# Patient Record
Sex: Female | Born: 1950 | Race: White | Hispanic: No | Marital: Married | Smoking: Former smoker
Health system: Southern US, Community
[De-identification: ages and names within clinical notes are randomized; demographics above are authoritative.]

## PROBLEM LIST (undated history)

## (undated) DIAGNOSIS — M899 Disorder of bone, unspecified: Secondary | ICD-10-CM

## (undated) DIAGNOSIS — E039 Hypothyroidism, unspecified: Secondary | ICD-10-CM

## (undated) DIAGNOSIS — I639 Cerebral infarction, unspecified: Secondary | ICD-10-CM

## (undated) DIAGNOSIS — R7302 Impaired glucose tolerance (oral): Secondary | ICD-10-CM

## (undated) DIAGNOSIS — G8194 Hemiplegia, unspecified affecting left nondominant side: Secondary | ICD-10-CM

## (undated) DIAGNOSIS — E559 Vitamin D deficiency, unspecified: Secondary | ICD-10-CM

## (undated) DIAGNOSIS — M949 Disorder of cartilage, unspecified: Secondary | ICD-10-CM

## (undated) DIAGNOSIS — F329 Major depressive disorder, single episode, unspecified: Secondary | ICD-10-CM

## (undated) DIAGNOSIS — E785 Hyperlipidemia, unspecified: Secondary | ICD-10-CM

## (undated) DIAGNOSIS — I10 Essential (primary) hypertension: Secondary | ICD-10-CM

## (undated) DIAGNOSIS — F411 Generalized anxiety disorder: Secondary | ICD-10-CM

## (undated) HISTORY — DX: Hypothyroidism, unspecified: E03.9

## (undated) HISTORY — DX: Disorder of cartilage, unspecified: M94.9

## (undated) HISTORY — DX: Essential (primary) hypertension: I10

## (undated) HISTORY — DX: Generalized anxiety disorder: F41.1

## (undated) HISTORY — DX: Disorder of bone, unspecified: M89.9

## (undated) HISTORY — DX: Major depressive disorder, single episode, unspecified: F32.9

## (undated) HISTORY — DX: Vitamin D deficiency, unspecified: E55.9

## (undated) HISTORY — DX: Impaired glucose tolerance (oral): R73.02

## (undated) HISTORY — DX: Hyperlipidemia, unspecified: E78.5

---

## 1999-03-11 ENCOUNTER — Emergency Department (HOSPITAL_COMMUNITY): Admission: EM | Admit: 1999-03-11 | Discharge: 1999-03-11 | Payer: Self-pay | Admitting: Emergency Medicine

## 1999-03-12 ENCOUNTER — Emergency Department (HOSPITAL_COMMUNITY): Admission: EM | Admit: 1999-03-12 | Discharge: 1999-03-12 | Payer: Self-pay | Admitting: Emergency Medicine

## 1999-03-20 ENCOUNTER — Emergency Department (HOSPITAL_COMMUNITY): Admission: EM | Admit: 1999-03-20 | Discharge: 1999-03-20 | Payer: Self-pay | Admitting: Emergency Medicine

## 2002-12-20 ENCOUNTER — Ambulatory Visit (HOSPITAL_COMMUNITY): Admission: RE | Admit: 2002-12-20 | Discharge: 2002-12-20 | Payer: Self-pay | Admitting: Obstetrics and Gynecology

## 2002-12-20 ENCOUNTER — Encounter (INDEPENDENT_AMBULATORY_CARE_PROVIDER_SITE_OTHER): Payer: Self-pay

## 2004-12-15 ENCOUNTER — Ambulatory Visit: Payer: Self-pay | Admitting: Internal Medicine

## 2005-05-06 ENCOUNTER — Other Ambulatory Visit: Admission: RE | Admit: 2005-05-06 | Discharge: 2005-05-06 | Payer: Self-pay | Admitting: Obstetrics and Gynecology

## 2005-12-15 ENCOUNTER — Ambulatory Visit: Payer: Self-pay | Admitting: Internal Medicine

## 2006-12-27 ENCOUNTER — Ambulatory Visit: Payer: Self-pay | Admitting: Internal Medicine

## 2006-12-27 LAB — CONVERTED CEMR LAB
ALT: 25 units/L (ref 0–40)
AST: 29 units/L (ref 0–37)
Albumin: 4.3 g/dL (ref 3.5–5.2)
Alkaline Phosphatase: 46 units/L (ref 39–117)
BUN: 11 mg/dL (ref 6–23)
Basophils Relative: 0.6 % (ref 0.0–1.0)
Bilirubin Urine: NEGATIVE
Calcium: 9.6 mg/dL (ref 8.4–10.5)
Chloride: 104 meq/L (ref 96–112)
Creatinine, Ser: 0.8 mg/dL (ref 0.4–1.2)
Crystals: NEGATIVE
GFR calc non Af Amer: 79 mL/min
HCT: 46 % (ref 36.0–46.0)
HDL: 64.8 mg/dL (ref >39.0)
Hemoglobin: 15.5 g/dL — ABNORMAL HIGH (ref 12.0–15.0)
Hgb A1c MFr Bld: 6.1 % — ABNORMAL HIGH (ref 4.6–6.0)
LDL Cholesterol: 107 mg/dL — ABNORMAL HIGH (ref 0–99)
Monocytes Absolute: 0.5 10*3/uL (ref 0.2–0.7)
Monocytes Relative: 6.2 % (ref 3.0–11.0)
Nitrite: NEGATIVE
RBC: 4.68 M/uL (ref 3.87–5.11)
RDW: 13.6 % (ref 11.5–14.6)
Specific Gravity, Urine: >=1.03 (ref 1.000–1.03)
Total Bilirubin: 0.7 mg/dL (ref 0.3–1.2)
Total CHOL/HDL Ratio: 3
VLDL: 24 mg/dL (ref 0–40)
pH: 5.5 (ref 5.0–8.0)

## 2008-01-10 ENCOUNTER — Encounter: Payer: Self-pay | Admitting: Internal Medicine

## 2008-02-12 ENCOUNTER — Ambulatory Visit: Payer: Self-pay | Admitting: Internal Medicine

## 2008-02-12 DIAGNOSIS — M899 Disorder of bone, unspecified: Secondary | ICD-10-CM

## 2008-02-12 DIAGNOSIS — E785 Hyperlipidemia, unspecified: Secondary | ICD-10-CM

## 2008-02-12 DIAGNOSIS — M8580 Other specified disorders of bone density and structure, unspecified site: Secondary | ICD-10-CM | POA: Insufficient documentation

## 2008-02-12 DIAGNOSIS — F411 Generalized anxiety disorder: Secondary | ICD-10-CM | POA: Insufficient documentation

## 2008-02-12 DIAGNOSIS — E039 Hypothyroidism, unspecified: Secondary | ICD-10-CM

## 2008-02-12 DIAGNOSIS — I10 Essential (primary) hypertension: Secondary | ICD-10-CM | POA: Insufficient documentation

## 2008-02-12 DIAGNOSIS — F3289 Other specified depressive episodes: Secondary | ICD-10-CM

## 2008-02-12 DIAGNOSIS — E739 Lactose intolerance, unspecified: Secondary | ICD-10-CM | POA: Insufficient documentation

## 2008-02-12 DIAGNOSIS — M949 Disorder of cartilage, unspecified: Secondary | ICD-10-CM

## 2008-02-12 DIAGNOSIS — F329 Major depressive disorder, single episode, unspecified: Secondary | ICD-10-CM

## 2008-02-12 HISTORY — DX: Hypothyroidism, unspecified: E03.9

## 2008-02-12 HISTORY — DX: Essential (primary) hypertension: I10

## 2008-02-12 HISTORY — DX: Disorder of bone, unspecified: M89.9

## 2008-02-12 HISTORY — DX: Hyperlipidemia, unspecified: E78.5

## 2008-02-12 HISTORY — DX: Generalized anxiety disorder: F41.1

## 2008-02-12 HISTORY — DX: Other specified depressive episodes: F32.89

## 2008-02-12 HISTORY — DX: Major depressive disorder, single episode, unspecified: F32.9

## 2008-02-13 LAB — CONVERTED CEMR LAB
ALT: 39 units/L — ABNORMAL HIGH (ref 0–35)
Albumin: 4.6 g/dL (ref 3.5–5.2)
BUN: 16 mg/dL (ref 6–23)
Basophils Absolute: 0.1 10*3/uL (ref 0.0–0.1)
Basophils Relative: 1 % (ref 0.0–1.0)
Bilirubin Urine: NEGATIVE
CO2: 33 meq/L — ABNORMAL HIGH (ref 19–32)
Calcium: 9 mg/dL (ref 8.4–10.5)
Creatinine, Ser: 1 mg/dL (ref 0.4–1.2)
Direct LDL: 117.3 mg/dL
Eosinophils Relative: 0.9 % (ref 0.0–5.0)
Glucose, Bld: 99 mg/dL (ref 70–99)
HCT: 44.1 % (ref 36.0–46.0)
HDL: 73 mg/dL (ref >39.0)
Hemoglobin, Urine: NEGATIVE
Hgb A1c MFr Bld: 6 % (ref 4.6–6.0)
Lymphocytes Relative: 27.3 % (ref 12.0–46.0)
MCHC: 34 g/dL (ref 30.0–36.0)
MCV: 101.2 fL — ABNORMAL HIGH (ref 78.0–100.0)
Monocytes Relative: 9.2 % (ref 3.0–12.0)
Neutro Abs: 4.2 10*3/uL (ref 1.4–7.7)
Nitrite: NEGATIVE
Total Protein, Urine: NEGATIVE mg/dL
Total Protein: 7.5 g/dL (ref 6.0–8.3)
Triglycerides: 103 mg/dL (ref 0–149)
Urine Glucose: NEGATIVE mg/dL
Urobilinogen, UA: 0.2 (ref 0.0–1.0)
VLDL: 21 mg/dL (ref 0–40)
WBC: 6.9 10*3/uL (ref 4.5–10.5)

## 2009-02-19 ENCOUNTER — Ambulatory Visit: Payer: Self-pay | Admitting: Internal Medicine

## 2009-02-19 DIAGNOSIS — G4710 Hypersomnia, unspecified: Secondary | ICD-10-CM | POA: Insufficient documentation

## 2009-02-19 LAB — CONVERTED CEMR LAB
ALT: 24 units/L (ref 0–35)
AST: 27 units/L (ref 0–37)
BUN: 16 mg/dL (ref 6–23)
Basophils Absolute: 0 10*3/uL (ref 0.0–0.1)
Bilirubin Urine: NEGATIVE
Bilirubin, Direct: 0.2 mg/dL (ref 0.0–0.3)
Creatinine, Ser: 0.9 mg/dL (ref 0.4–1.2)
Eosinophils Relative: 0.9 % (ref 0.0–5.0)
GFR calc non Af Amer: 67.91 mL/min (ref >60)
Glucose, Bld: 108 mg/dL — ABNORMAL HIGH (ref 70–99)
Hemoglobin, Urine: NEGATIVE
Ketones, ur: NEGATIVE mg/dL
LDL Cholesterol: 72 mg/dL (ref 0–99)
Monocytes Absolute: 0.4 10*3/uL (ref 0.1–1.0)
Monocytes Relative: 6.3 % (ref 3.0–12.0)
Neutrophils Relative %: 64.4 % (ref 43.0–77.0)
Platelets: 206 10*3/uL (ref 150.0–400.0)
RDW: 12.8 % (ref 11.5–14.6)
Total Bilirubin: 0.7 mg/dL (ref 0.3–1.2)
Total CHOL/HDL Ratio: 3
Total Protein, Urine: NEGATIVE mg/dL
Triglycerides: 148 mg/dL (ref 0.0–149.0)
Urine Glucose: NEGATIVE mg/dL
Urobilinogen, UA: 0.2 (ref 0.0–1.0)
VLDL: 29.6 mg/dL (ref 0.0–40.0)
WBC: 7.1 10*3/uL (ref 4.5–10.5)

## 2009-02-24 LAB — CONVERTED CEMR LAB: Vit D, 25-Hydroxy: 25 ng/mL — ABNORMAL LOW (ref 30–89)

## 2010-04-14 ENCOUNTER — Ambulatory Visit: Payer: Self-pay | Admitting: Internal Medicine

## 2010-04-14 DIAGNOSIS — E559 Vitamin D deficiency, unspecified: Secondary | ICD-10-CM

## 2010-04-14 HISTORY — DX: Vitamin D deficiency, unspecified: E55.9

## 2010-04-14 LAB — CONVERTED CEMR LAB
Albumin: 4.8 g/dL (ref 3.5–5.2)
Alkaline Phosphatase: 52 units/L (ref 39–117)
Basophils Absolute: 0 10*3/uL (ref 0.0–0.1)
Basophils Relative: 0.4 % (ref 0.0–3.0)
Bilirubin Urine: NEGATIVE
CO2: 34 meq/L — ABNORMAL HIGH (ref 19–32)
Chloride: 102 meq/L (ref 96–112)
Cholesterol: 227 mg/dL — ABNORMAL HIGH (ref 0–200)
Eosinophils Absolute: 0.1 10*3/uL (ref 0.0–0.7)
Glucose, Bld: 106 mg/dL — ABNORMAL HIGH (ref 70–99)
HCT: 44.9 % (ref 36.0–46.0)
HDL: 67.7 mg/dL (ref >39.00)
Hemoglobin: 15.4 g/dL — ABNORMAL HIGH (ref 12.0–15.0)
Lymphocytes Relative: 28.7 % (ref 12.0–46.0)
Lymphs Abs: 2.1 10*3/uL (ref 0.7–4.0)
MCHC: 34.3 g/dL (ref 30.0–36.0)
MCV: 101.8 fL — ABNORMAL HIGH (ref 78.0–100.0)
Neutro Abs: 4.7 10*3/uL (ref 1.4–7.7)
Nitrite: NEGATIVE
Potassium: 4 meq/L (ref 3.5–5.1)
RBC: 4.41 M/uL (ref 3.87–5.11)
RDW: 13.9 % (ref 11.5–14.6)
Sodium: 143 meq/L (ref 135–145)
Total Protein, Urine: NEGATIVE mg/dL
Total Protein: 7.9 g/dL (ref 6.0–8.3)
Urine Glucose: NEGATIVE mg/dL
VLDL: 86 mg/dL — ABNORMAL HIGH (ref 0.0–40.0)
pH: 5.5 (ref 5.0–8.0)

## 2010-08-06 ENCOUNTER — Telehealth: Payer: Self-pay | Admitting: Internal Medicine

## 2010-08-10 ENCOUNTER — Ambulatory Visit: Payer: Self-pay | Admitting: Internal Medicine

## 2010-10-29 ENCOUNTER — Telehealth: Payer: Self-pay | Admitting: Internal Medicine
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
## 2010-11-04 NOTE — Progress Notes (Signed)
  Phone Note Refill Request Message from:  Fax from Pharmacy on August 06, 2010 2:27 PM  Refills Requested: Medication #1:  LEVOXYL 100 MCG  TABS 1 by mouth once daily   Dosage confirmed as above?Dosage Confirmed   Last Refilled: 04/2010   Notes: [facility]  Medication #2:  LISINOPRIL 10 MG  TABS 1 by mouth once daily   Dosage confirmed as above?Dosage Confirmed   Last Refilled: 04/2010   Notes: [facility]  Medication #3:  LOVASTATIN 20 MG TABS Take 1 tablet by mouth once a day   Dosage confirmed as above?Dosage Confirmed   Last Refilled: 04/2010   Notes: [facility] Initial call taken by: Robin Ewing CMA (AAMA),  August 06, 2010 2:28 PM    Prescriptions: LOVASTATIN 20 MG TABS (LOVASTATIN) Take 1 tablet by mouth once a day  #90 x 2   Entered by:   Sharon Seller CMA ([place])   Authorized by:   Biagio Borg MD   Signed by:   Sharon Seller CMA ([place]) on 08/06/2010   Method used:   Faxed to ...       [facility] (mail-order)             , [place]         Ph: [phone]       Fax: [phone]   [id number] LISINOPRIL 10 MG  TABS (LISINOPRIL) 1 by mouth once daily  #90 x 2   Entered by:   Shirlean Mylar Ewing CMA ([place])   Authorized by:   Biagio Borg MD   Signed by:   Sharon Seller CMA ([place]) on 08/06/2010   Method used:   Faxed to ...       [facility] (mail-order)             , [place]         Ph: [phone]       Fax: [phone]   [id number] LEVOXYL 100 MCG  TABS (LEVOTHYROXINE SODIUM) 1 by mouth once daily  #90 x 2   Entered by:   Shirlean Mylar Ewing CMA ([place])   Authorized by:   Biagio Borg MD   Signed by:   Sharon Seller CMA ([place]) on 08/06/2010   Method used:   Faxed to ...       [facility] (mail-order)             , [place]         Ph: [phone]       Fax: [phone]   RxID:   [id number]

## 2010-11-04 NOTE — Assessment & Plan Note (Signed)
Summary: FOLLOW UP TO DISCUSS RX-LB   Vital Signs:  Patient profile:   60 year old female Height:      63 inches Weight:      142.13 pounds BMI:     25.27 O2 Sat:      96 % on Room air Temp:     98.9 degrees F oral Pulse rate:   84 / minute BP sitting:   150 / 82  (left arm) Cuff size:   regular  Vitals Entered By: Shirlean Mylar Ewing CMA [facility]) (August 10, 2010 9:34 AM)  O2 Flow:  Room air CC: followup discuss medication/RE   CC:  followup discuss medication/RE.  History of Present Illness: here to f/u, unfortunately abilify too expensive and not taking, Denies worsening depressive symptoms, suicidal ideation, or panic. or psychosis symptoms  and plans to f/u with Dr Reddy/psych soon.  Pt denies CP, worsening sob, doe, wheezing, orthopnea, pnd, worsening LE edema, palps, dizziness or syncope  Pt denies new neuro symptoms such as headache, facial or extremity weakness  No fever, wt loss, night sweats, loss of appetite or other constitutional symptoms  Pt denies polydipsia, polyuria.  Overall good compliance with meds, trying to follow low chol, DM diet, wt stable, little excercise however .  Denies hyper or hypothyroid symtpoms such at wt, voice, skin changes.  Not yet taken her BP med this am.  BP at home more often < 140/90  ROS:  all other neg per pt  Problems Prior to Update: 1)  Vitamin D Deficiency  (ICD-268.9) 2)  Preventive Health Care  (ICD-V70.0) 3)  Hypersomnia  (ICD-780.54) 4)  Preventive Health Care  (ICD-V70.0) 5)  Preventive Health Care  (ICD-V70.0) 6)  Anxiety  (ICD-300.00) 7)  Glucose Intolerance  (ICD-271.3) 8)  Osteopenia  (ICD-733.90) 9)  Hypothyroidism  (ICD-244.9) 10)  Hypertension  (ICD-401.9) 11)  Hyperlipidemia  (ICD-272.4) 12)  Depression  (ICD-311)  Medications Prior to Update: 1)  Levoxyl 100 Mcg  Tabs (Levothyroxine Sodium) .[name] Kitchen.. 1 By Mouth Once Daily 2)  Lisinopril 10 Mg  Tabs (Lisinopril) .[name] Kitchen.. 1 By Mouth Once Daily 3)  Alprazolam 0.5 Mg Tabs  (Alprazolam) .... Take 1 Tablet By Mouth Twice A Day 4)  Lovastatin 20 Mg Tabs (Lovastatin) .... Take 1 Tablet By Mouth Once A Day 5)  Paroxetine Hcl 40 Mg Tabs (Paroxetine Hcl) .... Take 1 and 1/2 By Mouth Once Daily 6)  Abilify 5 Mg Tabs (Aripiprazole) .... 1/2 By Mouth Once Daily 7)  Vitamin D 1000 Unit Tabs (Cholecalciferol) .[name] Kitchen.. 1po Once Daily  Current Medications (verified): 1)  Levoxyl 100 Mcg  Tabs (Levothyroxine Sodium) .[name] Kitchen.. 1 By Mouth Once Daily 2)  Lisinopril 10 Mg  Tabs (Lisinopril) .[name] Kitchen.. 1 By Mouth Once Daily 3)  Alprazolam 0.5 Mg Tabs (Alprazolam) .... Take 1 Tablet By Mouth 6 Times Per Day As Needed - Per Dr Reece Levy 4)  Lovastatin 20 Mg Tabs (Lovastatin) .... Take 1 Tablet By Mouth Once A Day 5)  Paroxetine Hcl 40 Mg Tabs (Paroxetine Hcl) .[name] Kitchen.. 1 By Mouth Qid 6)  Vitamin D 1000 Unit Tabs (Cholecalciferol) .[name] Kitchen.. 1po Once Daily  Allergies (verified): 1)  ! 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   Head:  normocephalic and atraumatic.   Eyes:  vision grossly intact, pupils equal, and pupils round.   Ears:  R ear normal and L ear normal.   Nose:  no external deformity and no nasal discharge.   Mouth:  no gingival abnormalities and pharynx pink and moist.   Neck:  supple and no masses.   Lungs:  normal respiratory effort and normal breath sounds.   Heart:  normal rate and regular rhythm.   Abdomen:  soft, non-tender, and normal bowel sounds.   Extremities:  no edema,  no erythema    Impression & Recommendations:  Problem # 1:  HYPOTHYROIDISM (ICD-244.9)  Her updated medication list for this problem includes:    Levoxyl 100 Mcg Tabs (Levothyroxine sodium) .[name] Kitchen... 1 by mouth once daily  Labs Reviewed: TSH: 2.40 (04/14/2010)    HgBA1c: 6.0 (02/12/2008) Chol: 227 (04/14/2010)   HDL: 67.70 (04/14/2010)   LDL: 72 (02/19/2009)   TG: 430.0 (04/14/2010) stable overall by hx and exam, ok to continue meds/tx as is   Problem # 2:  HYPERTENSION (ICD-401.9)  Her updated medication list for this problem includes:    Lisinopril 10 Mg Tabs (Lisinopril) .[name] Kitchen... 1 by mouth once daily  BP today: 150/82 Prior BP: 132/82 (04/14/2010)  Labs Reviewed: K+: 4.0 (04/14/2010) Creat: : 0.9 (04/14/2010)   Chol: 227 (04/14/2010)   HDL: 67.70 (04/14/2010)   LDL: 72 (02/19/2009)   TG: 430.0 (04/14/2010) mild elev today, likely situational, ok to follow, continue same treatment  - not yet taken med this am  Problem # 3:  HYPERLIPIDEMIA (ICD-272.4)  Her updated medication list for this problem includes:    Lovastatin 20 Mg Tabs (Lovastatin) .[name] Kitchen... Take 1 tablet by mouth once a day  Labs Reviewed: SGOT: 29 (04/14/2010)   SGPT: 28 (04/14/2010)   HDL:67.70 (04/14/2010), 67.10 (02/19/2009)  LDL:72 (02/19/2009), DEL (02/12/2008)  Chol:227 (04/14/2010), 169 (02/19/2009)  Trig:430.0 (04/14/2010), 148.0 (02/19/2009) stable overall by hx and exam, ok to continue meds/tx as is   Problem # 4:  DEPRESSION (ICD-311)  Her updated medication list for this problem includes:    Alprazolam 0.5 Mg Tabs (Alprazolam) .[name] Kitchen... Take 1 tablet by mouth 6 times per day as needed - per dr reddy    Paroxetine Hcl 40 Mg Tabs (Paroxetine hcl) .[name] Kitchen... 1 by mouth qid pt to stop the abilify, f/u Dr Reece Levy 2 wks as planned  Complete Medication List: 1)  Levoxyl 100 Mcg Tabs (Levothyroxine sodium) .[name] Kitchen.. 1 by mouth once daily 2)  Lisinopril 10 Mg Tabs (Lisinopril) .[name] Kitchen.. 1 by mouth once daily 3)  Alprazolam 0.5 Mg  Tabs (Alprazolam) .... Take 1 tablet by mouth 6 times per day as needed - per dr reddy 4)  Lovastatin 20 Mg Tabs (Lovastatin) .... Take 1 tablet by mouth once a day 5)  Paroxetine Hcl 40 Mg Tabs (Paroxetine hcl) .[name] Kitchen.. 1 by mouth qid 6)  Vitamin D 1000 Unit Tabs (Cholecalciferol) .[name] Kitchen.. 1po once daily  Patient Instructions: 1)  stop the abilify  2)  you get the alprazolam and paroxetine through Dr Reece Levy through [facility] 3)  Your medications were otherwise [facility] to [facility] 4)  Please schedule a follow-up appointment in July 2012 , or sooner if needed Prescriptions: LOVASTATIN 20 MG TABS (LOVASTATIN) Take 1 tablet by mouth once a day  #90 x 3   Entered and Authorized by:   Biagio Borg MD  Signed by:   Biagio Borg MD on 08/10/2010   Method used:   Faxed to ...       [facility] (mail-order)             , [place]         Ph: [phone]       Fax: [phone]   [id number] LISINOPRIL 10 MG  TABS (LISINOPRIL) 1 by mouth once daily  #90 x 3   Entered and Authorized by:   Biagio Borg MD   Signed by:   Biagio Borg MD on 08/10/2010   Method used:   Faxed to ...       [facility] (mail-order)             , [place]         Ph: [phone]       Fax: [phone]   [id number] LEVOXYL 100 MCG  TABS (LEVOTHYROXINE SODIUM) 1 by mouth once daily  #90 x 3   Entered and Authorized by:   Biagio Borg MD   Signed by:   Biagio Borg MD on 08/10/2010   Method used:   Faxed to ...       [facility] (mail-order)             , [place]         Ph: [phone]       Fax: [phone]   RxID:   [id number]    Orders Added: 1)  Est. Patient Level IV [id number]

## 2010-11-04 NOTE — Progress Notes (Signed)
  Phone Note Call from Patient Call back at [facility] Phone [phone]   Caller: Patient Summary of Call: patient called requesting samples of Abilify. Informed patient Dr. Jenny Reichmann removed Abilify from medication list at [facility] 08/10/2010 due to med. being too expensive and patient had not been taking. Informed patient the last office note said she was to f/u with Dr. Reece Levy. Initial call taken by: Robin Ewing CMA (AAMA),  October 29, 2010 1:52 PM

## 2011-02-18 NOTE — Op Note (Signed)
   NAME:  Susan Davidson, Susan Davidson                         ACCOUNT NO.:  [id number]   MEDICAL RECORD NO.:  [id number]                   PATIENT TYPE:  AMB   LOCATION:  [place]                                  FACILITY:  [facility]   PHYSICIAN:  Bobbye Charleston, M.D.              DATE OF BIRTH:  [birth year]   DATE OF PROCEDURE:  12/20/2002  DATE OF DISCHARGE:                                 OPERATIVE REPORT   PREOPERATIVE DIAGNOSES:  CIN III, extensive both ecto and endocervical.  Unable to do LEEP in office.   POSTOPERATIVE DIAGNOSES:  CIN III, extensive both ecto and endocervical.  Unable to do LEEP in office.   PROCEDURE:  LEEP cone.   ATTENDING:  Bobbye Charleston, M.D.   ANESTHESIA:  MAC.   ESTIMATED BLOOD LOSS:  Minimal.   IV FLUIDS:  800 mL.   URINE OUTPUT:  Not measured.   COMPLICATIONS:  None.   FINDINGS:  Lesion as described on H&P.   MEDICATIONS:  Toradol and about 6 mL of 1% lidocaine with epinephrine.   PATHOLOGY:  1. Ectocervix stitch at 9 o'clock.  2. Ectocervix stitch at 3 o'clock.  3. Endocervix, no stitch.  4. Endocervix stitch at superior margin.   TECHNIQUE:  After adequate anesthesia was achieved patient was prepped and  draped in usual sterile fashion.  The speculum was placed inside the vagina  and the cervix was seen.  Acetic acid was applied to the cervix and  colposcope indicated the lesion as had been previously seen on colposcopy in  the office.  6 mL of lidocaine was then injected in four quadrants on the  cervix without complications.  Lugol solution was then applied and then the  LEEP was performed.  Two passes was used of the 2.1 loop to excise the  ectocervix, then the 1 x 1 loop was then changed out and used to take two  specimens of the endocervix.  Roller ball cautery was used to secure  hemostasis.  Monsel's was applied.  All instruments were withdrawn from the  vagina and the specimens were marked and sent to pathology.  The patient  tolerated  procedure well.  Returned to recovery room in stable condition.                                              Bobbye Charleston, M.D.   MH/MEDQ  D:  12/20/2002  T:  12/20/2002  Job:  [id number]

## 2011-02-18 NOTE — Assessment & Plan Note (Signed)
Susan Davidson                                 ON-CALL NOTE   NAME:Susan Davidson, Susan Davidson                      MRN:          [id number]  DATE:01/04/2007                            DOB:          [birth year]    TIME:  6:27 p.m.   PHONE NUMBER:  [phone].   OBJECTIVE:  Patient has a problem with the amount of her prescription.  She saw Dr. Jenny Reichmann and had an appointment to see her psychiatrist the same  day.  Dr. Jenny Reichmann said he would take care of the alprazolam prescription,  which the patient says is to be 150 pills a month, not 30 pills a month,  as it was written.  She cancelled her psychiatry appointment, and now it  is very hard to get one back.  She is very aggravated.  She has tried  multiple times to get to Dr. Jenny Reichmann, has spoken to the secretary three  times.  Has been trying for a week to get her prescription rectified.  Is very aggravated, threatening to change practices.   Primary care Huntleigh Doolen is Dr. Jenny Reichmann.  Home office is [facility].     Modesto Charon, MD  Electronically Signed    Susan Davidson  DD: 01/04/2007  DT: 01/04/2007  Job #: [id number]   cc:   Biagio Borg, MD

## 2011-04-22 ENCOUNTER — Other Ambulatory Visit: Payer: Self-pay

## 2011-04-22 MED ORDER — LEVOTHYROXINE SODIUM 100 MCG PO TABS: 100.0000 ug | ORAL_TABLET | Freq: Every day | ORAL | Status: DC

## 2011-05-24 ENCOUNTER — Other Ambulatory Visit: Payer: Self-pay

## 2011-05-24 MED ORDER — LISINOPRIL 10 MG PO TABS: 10.0000 mg | ORAL_TABLET | Freq: Every day | ORAL | Status: DC

## 2011-05-24 MED ORDER — LOVASTATIN 20 MG PO TABS: 20.0000 mg | ORAL_TABLET | Freq: Every day | ORAL | Status: DC

## 2011-06-15 ENCOUNTER — Other Ambulatory Visit: Payer: Self-pay

## 2011-06-15 MED ORDER — LEVOTHYROXINE SODIUM 100 MCG PO TABS: 100.0000 ug | ORAL_TABLET | Freq: Every day | ORAL | Status: DC

## 2011-06-15 NOTE — Telephone Encounter (Signed)
Called the patient left message to inform the need to schedule appointment with Dr. Jenny Reichmann in order to continue getting refills.

## 2011-06-17 ENCOUNTER — Other Ambulatory Visit: Payer: Self-pay | Admitting: *Deleted

## 2011-06-17 NOTE — Telephone Encounter (Signed)
Ok with me 

## 2011-06-17 NOTE — Telephone Encounter (Signed)
[facility] pharmacy called regarding rx for pt. Pt wants to swtich to brand name Levoxyl instead of Synthroid/generic levothyroxine-okay to change rx to brand name Levoxyl?    Reference #: [id number]

## 2011-06-17 NOTE — Telephone Encounter (Signed)
Rx called into pharmacy for Levoxyl

## 2011-06-23 ENCOUNTER — Telehealth: Payer: Self-pay

## 2011-06-23 NOTE — Telephone Encounter (Signed)
Patient called requesting name of her thyroid medication. Informed Levoxyl was sent in on June 17, 2011 per patient request.

## 2011-08-27 ENCOUNTER — Encounter: Payer: Self-pay | Admitting: Internal Medicine

## 2011-08-27 DIAGNOSIS — R7302 Impaired glucose tolerance (oral): Secondary | ICD-10-CM

## 2011-08-27 DIAGNOSIS — Z0000 Encounter for general adult medical examination without abnormal findings: Secondary | ICD-10-CM | POA: Insufficient documentation

## 2011-08-27 DIAGNOSIS — Z0001 Encounter for general adult medical examination with abnormal findings: Secondary | ICD-10-CM | POA: Insufficient documentation

## 2011-08-27 HISTORY — DX: Impaired glucose tolerance (oral): R73.02

## 2011-08-30 ENCOUNTER — Encounter: Payer: Self-pay | Admitting: Internal Medicine

## 2011-08-30 ENCOUNTER — Other Ambulatory Visit (INDEPENDENT_AMBULATORY_CARE_PROVIDER_SITE_OTHER): Payer: BC Managed Care – PPO

## 2011-08-30 ENCOUNTER — Ambulatory Visit (INDEPENDENT_AMBULATORY_CARE_PROVIDER_SITE_OTHER): Payer: BC Managed Care – PPO | Admitting: Internal Medicine

## 2011-08-30 VITALS — BP 132/80 | HR 90 | Temp 98.4°F | Ht 63.0 in | Wt 151.5 lb

## 2011-08-30 DIAGNOSIS — I10 Essential (primary) hypertension: Secondary | ICD-10-CM

## 2011-08-30 DIAGNOSIS — R7309 Other abnormal glucose: Secondary | ICD-10-CM

## 2011-08-30 DIAGNOSIS — Z0000 Encounter for general adult medical examination without abnormal findings: Secondary | ICD-10-CM

## 2011-08-30 DIAGNOSIS — R7302 Impaired glucose tolerance (oral): Secondary | ICD-10-CM

## 2011-08-30 LAB — CBC WITH DIFFERENTIAL/PLATELET
Basophils Absolute: 0 10*3/uL (ref 0.0–0.1)
Basophils Relative: 0.3 % (ref 0.0–3.0)
Eosinophils Absolute: 0.1 10*3/uL (ref 0.0–0.7)
Hemoglobin: 14.6 g/dL (ref 12.0–15.0)
MCHC: 33.9 g/dL (ref 30.0–36.0)
MCV: 101.7 fl — ABNORMAL HIGH (ref 78.0–100.0)
Monocytes Absolute: 0.6 10*3/uL (ref 0.1–1.0)
Neutro Abs: 4.9 10*3/uL (ref 1.4–7.7)
Neutrophils Relative %: 65.3 % (ref 43.0–77.0)
RBC: 4.24 Mil/uL (ref 3.87–5.11)
RDW: 14.9 % — ABNORMAL HIGH (ref 11.5–14.6)

## 2011-08-30 LAB — BASIC METABOLIC PANEL
CO2: 26 mEq/L (ref 19–32)
Calcium: 9.2 mg/dL (ref 8.4–10.5)
Creatinine, Ser: 1 mg/dL (ref 0.4–1.2)
GFR: 60.33 mL/min (ref >60.00)
Sodium: 142 mEq/L (ref 135–145)

## 2011-08-30 LAB — HEPATIC FUNCTION PANEL
Alkaline Phosphatase: 47 U/L (ref 39–117)
Bilirubin, Direct: 0.1 mg/dL (ref 0.0–0.3)
Total Bilirubin: 0.5 mg/dL (ref 0.3–1.2)
Total Protein: 7.4 g/dL (ref 6.0–8.3)

## 2011-08-30 LAB — URINALYSIS, ROUTINE W REFLEX MICROSCOPIC
Bilirubin Urine: NEGATIVE
Hgb urine dipstick: NEGATIVE
Total Protein, Urine: NEGATIVE
Urine Glucose: NEGATIVE

## 2011-08-30 LAB — LIPID PANEL
HDL: 72.5 mg/dL (ref >39.00)
LDL Cholesterol: 95 mg/dL (ref 0–99)
Total CHOL/HDL Ratio: 3
Triglycerides: 148 mg/dL (ref 0.0–149.0)

## 2011-08-30 MED ORDER — LEVOTHYROXINE SODIUM 100 MCG PO TABS: 100.0000 ug | ORAL_TABLET | Freq: Every day | ORAL | Status: DC

## 2011-08-30 MED ORDER — LOVASTATIN 20 MG PO TABS: 20.0000 mg | ORAL_TABLET | Freq: Every day | ORAL | Status: DC

## 2011-08-30 MED ORDER — BUPROPION HCL ER (XL) 150 MG PO TB24: 150.0000 mg | ORAL_TABLET | Freq: Every day | ORAL | Status: DC

## 2011-08-30 MED ORDER — ASPIRIN EC 81 MG PO TBEC: 81.0000 mg | DELAYED_RELEASE_TABLET | Freq: Every day | ORAL | Status: AC

## 2011-08-30 MED ORDER — LISINOPRIL 10 MG PO TABS: 10.0000 mg | ORAL_TABLET | Freq: Every day | ORAL | Status: DC

## 2011-08-30 NOTE — Assessment & Plan Note (Signed)
stable overall by hx and exam,, and pt to continue medical treatment as before, cont diet/wt control efforts  For a1c today

## 2011-08-30 NOTE — Assessment & Plan Note (Signed)
Overall doing well, age appropriate education and counseling updated, referrals for preventative services and immunizations addressed, dietary and smoking counseling addressed, most recent labs and ECG reviewed.  I have personally reviewed and have noted: 1) the patient's medical and social history 2) The pt's use of alcohol, tobacco, and illicit drugs 3) The patient's current medications and supplements 4) Functional ability including ADL's, fall risk, home safety risk, hearing and visual impairment 5) Diet and physical activities 6) Evidence for depression or mood disorder 7) The patient's height, weight, and BMI have been recorded in the chart I have made referrals, and provided counseling and education based on review of the above Declines colonscopy or immunizations

## 2011-08-30 NOTE — Progress Notes (Signed)
Subjective:    Patient ID: Susan Davidson, female    DOB: 1951-02-06, 60 y.o.   MRN: [id number]  HPI Here for f/u;  Overall doing ok;  Pt denies CP, worsening SOB, DOE, wheezing, orthopnea, PND, worsening LE edema, palpitations, dizziness or syncope.  Pt denies neurological change such as new Headache, facial or extremity weakness.  Pt denies polydipsia, polyuria, or low sugar symptoms. Pt states overall good compliance with treatment and medications, good tolerability, and trying to follow lower cholesterol diet.  Pt denies worsening depressive symptoms, suicidal ideation or panic. No fever, wt loss, night sweats, loss of appetite, or other constitutional symptoms.  Pt states good ability with ADL's, low fall risk, home safety reviewed and adequate, no significant changes in hearing or vision, and occasionally active with exercise.  No other new complaints.  Denies hyper or hypo thyroid symptoms such as voice, skin or hair change. Does have sense of ongoing fatigue, but denies signficant hypersomnolence. And wellbutrin recetnly increased per pscyhiatry. Past Medical History  Diagnosis Date  . Impaired glucose tolerance 08/27/2011   No past surgical history on file.  reports that she has been smoking.  She does not have any smokeless tobacco history on file. Her alcohol and drug histories not on file. family history is not on file. Allergies  Allergen Reactions  . Epinephrine   . Influenza Vac Typ     fatigue   No current outpatient prescriptions on file prior to visit.   Review of Systems Review of Systems  Constitutional: Negative for diaphoresis, activity change, appetite change and unexpected weight change.  HENT: Negative for hearing loss, ear pain, facial swelling, mouth sores and neck stiffness.   Eyes: Negative for pain, redness and visual disturbance.  Respiratory: Negative for shortness of breath and wheezing.   Cardiovascular: Negative for chest pain and palpitations.    Gastrointestinal: Negative for diarrhea, blood in stool, abdominal distention and rectal pain.  Genitourinary: Negative for hematuria, flank pain and decreased urine volume.  Musculoskeletal: Negative for myalgias and joint swelling.  Skin: Negative for color change and wound.  Neurological: Negative for syncope and numbness.  Hematological: Negative for adenopathy.  Psychiatric/Behavioral: Negative for hallucinations, self-injury, decreased concentration and agitation.      Objective:   Physical Exam BP 132/80  Pulse 90  Temp(Src) 98.4 F (36.9 C) (Oral)  Ht 5\' 3"  (1.6 m)  Wt 151 lb 8 oz (68.72 kg)  BMI 26.84 kg/m2  SpO2 96% Physical Exam  VS noted Constitutional: Pt is oriented to person, place, and time. Appears well-developed and well-nourished.  HENT:  Head: Normocephalic and atraumatic.  Right Ear: External ear normal.  Left Ear: External ear normal.  Nose: Nose normal.  Mouth/Throat: Oropharynx is clear and moist.  Eyes: Conjunctivae and EOM are normal. Pupils are equal, round, and reactive to light.  Neck: Normal range of motion. Neck supple. No JVD present. No tracheal deviation present.  Cardiovascular: Normal rate, regular rhythm, normal heart sounds and intact distal pulses.   Pulmonary/Chest: Effort normal and breath sounds normal.  Abdominal: Soft. Bowel sounds are normal. There is no tenderness.  Musculoskeletal: Normal range of motion. Exhibits no edema.  Lymphadenopathy:  Has no cervical adenopathy.  Neurological: Pt is alert and oriented to person, place, and time. Pt has normal reflexes. No cranial nerve deficit.  Skin: Skin is warm and dry. No rash noted.  Psychiatric:  Has  normal mood and affect. Behavior is normal.     Assessment & Plan:

## 2011-08-30 NOTE — Patient Instructions (Addendum)
Continue all other medications as before Your medications were refilled to the pharmacy Please start Aspirin 81 mg - 1 per day - COATED only - to help reduce risk of stroke and heart attack Please go to LAB in the Basement for the blood and/or urine tests to be done today Please call the phone number [phone] (the [place]) for results of testing in 2-3 days;  When calling, simply dial the number, and when prompted enter the MRN number above (the Medical Record Number) and the # key, then the message should start. Please return in 1 year for your yearly visit, or sooner if needed, with Lab testing done 3-5 days before

## 2011-08-30 NOTE — Assessment & Plan Note (Signed)
stable overall by hx and exam, most recent data reviewed with pt, and pt to continue medical treatment as before  BP Readings from Last 3 Encounters:  08/30/11 132/80  08/10/10 150/82  04/14/10 132/82

## 2011-09-05 ENCOUNTER — Telehealth: Payer: Self-pay

## 2011-09-05 NOTE — Telephone Encounter (Signed)
Ok for change to generic levoxyl - to robin to handle

## 2011-09-05 NOTE — Telephone Encounter (Signed)
Pharmacy called requesting MD authorization to change synthroid to levoxyl per pt request. Ref # [id number]

## 2011-09-06 MED ORDER — LEVOTHYROXINE SODIUM 100 MCG PO TABS: 100.0000 ug | ORAL_TABLET | Freq: Every day | ORAL | Status: DC

## 2011-09-08 ENCOUNTER — Telehealth: Payer: Self-pay | Admitting: *Deleted

## 2011-09-08 NOTE — Telephone Encounter (Signed)
Received script for synthroid, but pt been taking levoxyl 100mg  and want to continue taking levoxyl. Is this ok for pt to continue. Call back [phone], ref # [id number]

## 2011-09-08 NOTE — Telephone Encounter (Signed)
Either is ok with me as they are pharmacologically essentially the same

## 2011-09-09 NOTE — Telephone Encounter (Signed)
Notified [facility] spoke with Jennie/pharmacist per md ok for levoxyl...09/09/11@11 :58am/LMB

## 2012-01-27 ENCOUNTER — Other Ambulatory Visit: Payer: Self-pay

## 2012-01-27 MED ORDER — LEVOTHYROXINE SODIUM 100 MCG PO TABS: 100.0000 ug | ORAL_TABLET | Freq: Every day | ORAL | Status: DC

## 2012-01-27 NOTE — Telephone Encounter (Signed)
Patient called requested refill on thyroid medication.

## 2012-07-13 ENCOUNTER — Other Ambulatory Visit: Payer: Self-pay

## 2012-07-13 MED ORDER — LOVASTATIN 20 MG PO TABS: 20.0000 mg | ORAL_TABLET | Freq: Every day | ORAL | Status: DC

## 2012-07-13 MED ORDER — LISINOPRIL 10 MG PO TABS: 10.0000 mg | ORAL_TABLET | Freq: Every day | ORAL | Status: DC

## 2012-09-03 ENCOUNTER — Other Ambulatory Visit: Payer: Self-pay

## 2012-09-03 MED ORDER — LOVASTATIN 20 MG PO TABS: 20.0000 mg | ORAL_TABLET | Freq: Every day | ORAL | Status: DC

## 2012-09-03 MED ORDER — LISINOPRIL 10 MG PO TABS: 10.0000 mg | ORAL_TABLET | Freq: Every day | ORAL | Status: DC

## 2012-09-03 NOTE — Addendum Note (Signed)
Addended by: Sharon Seller B on: 09/03/2012 11:49 AM   Modules accepted: Orders

## 2012-10-19 ENCOUNTER — Ambulatory Visit (INDEPENDENT_AMBULATORY_CARE_PROVIDER_SITE_OTHER): Payer: No Typology Code available for payment source | Admitting: Internal Medicine

## 2012-10-19 ENCOUNTER — Other Ambulatory Visit (INDEPENDENT_AMBULATORY_CARE_PROVIDER_SITE_OTHER): Payer: No Typology Code available for payment source

## 2012-10-19 VITALS — BP 138/82 | HR 96 | Temp 98.5°F | Ht 63.0 in | Wt 147.0 lb

## 2012-10-19 DIAGNOSIS — Z0000 Encounter for general adult medical examination without abnormal findings: Secondary | ICD-10-CM

## 2012-10-19 DIAGNOSIS — R400 Somnolence: Secondary | ICD-10-CM | POA: Insufficient documentation

## 2012-10-19 DIAGNOSIS — G4710 Hypersomnia, unspecified: Secondary | ICD-10-CM

## 2012-10-19 DIAGNOSIS — Z23 Encounter for immunization: Secondary | ICD-10-CM

## 2012-10-19 LAB — BASIC METABOLIC PANEL
BUN: 18 mg/dL (ref 6–23)
Calcium: 9.3 mg/dL (ref 8.4–10.5)
GFR: 61.54 mL/min (ref >60.00)
Glucose, Bld: 110 mg/dL — ABNORMAL HIGH (ref 70–99)
Sodium: 139 mEq/L (ref 135–145)

## 2012-10-19 LAB — URINALYSIS, ROUTINE W REFLEX MICROSCOPIC
Ketones, ur: NEGATIVE
Specific Gravity, Urine: >=1.03 (ref 1.000–1.030)
Urine Glucose: NEGATIVE
pH: 5.5 (ref 5.0–8.0)

## 2012-10-19 LAB — CBC WITH DIFFERENTIAL/PLATELET
Eosinophils Relative: 1.4 % (ref 0.0–5.0)
HCT: 45.4 % (ref 36.0–46.0)
Hemoglobin: 15.3 g/dL — ABNORMAL HIGH (ref 12.0–15.0)
Lymphs Abs: 2 10*3/uL (ref 0.7–4.0)
Monocytes Relative: 6.9 % (ref 3.0–12.0)
Neutro Abs: 4.7 10*3/uL (ref 1.4–7.7)
RBC: 4.58 Mil/uL (ref 3.87–5.11)
WBC: 7.3 10*3/uL (ref 4.5–10.5)

## 2012-10-19 LAB — LIPID PANEL
HDL: 49.3 mg/dL (ref >39.00)
Triglycerides: 144 mg/dL (ref 0.0–149.0)

## 2012-10-19 LAB — HEPATIC FUNCTION PANEL: Albumin: 4.3 g/dL (ref 3.5–5.2)

## 2012-10-19 LAB — TSH: TSH: 2.51 u[IU]/mL (ref 0.35–5.50)

## 2012-10-19 LAB — LDL CHOLESTEROL, DIRECT: Direct LDL: 137.5 mg/dL

## 2012-10-19 MED ORDER — LISINOPRIL 10 MG PO TABS: 10.0000 mg | ORAL_TABLET | Freq: Every day | ORAL | Status: DC

## 2012-10-19 MED ORDER — PAROXETINE HCL 20 MG PO TABS: ORAL_TABLET | ORAL | Status: DC

## 2012-10-19 MED ORDER — LOVASTATIN 20 MG PO TABS: 20.0000 mg | ORAL_TABLET | Freq: Every day | ORAL | Status: DC

## 2012-10-19 MED ORDER — LEVOTHYROXINE SODIUM 100 MCG PO TABS: 100.0000 ug | ORAL_TABLET | Freq: Every day | ORAL | Status: DC

## 2012-10-19 NOTE — Assessment & Plan Note (Signed)

## 2012-10-19 NOTE — Assessment & Plan Note (Signed)
Ok to refer pulm

## 2012-10-19 NOTE — Progress Notes (Signed)
Subjective:    Patient ID: Susan Davidson, female    DOB: 07-08-1951, 62 y.o.   MRN: [id number]  HPI  Here for wellness and f/u;  Overall doing ok;  Pt denies CP, worsening SOB, DOE, wheezing, orthopnea, PND, worsening LE edema, palpitations, dizziness or syncope.  Pt denies neurological change such as new headache, facial or extremity weakness.  Pt denies polydipsia, polyuria, or low sugar symptoms. Pt states overall good compliance with treatment and medications, good tolerability, and has been trying to follow lower cholesterol diet.  Pt denies worsening depressive symptoms, suicidal ideation or panic. No fever, night sweats, wt loss, loss of appetite, or other constitutional symptoms.  Pt states good ability with ADL's, has low fall risk, home safety reviewed and adequate, no other significant changes in hearing or vision, and only occasionally active with exercise.  Has fatigue and snoring, has chronic depression on mult meds but sleeps about 18 hrs.   Past Medical History  Diagnosis Date  . Impaired glucose tolerance 08/27/2011  . HYPOTHYROIDISM 02/12/2008    Qualifier: Diagnosis of  By: Elveria Royals   . HYPERLIPIDEMIA 02/12/2008    Qualifier: Diagnosis of  By: Elveria Royals   . ANXIETY 02/12/2008    Qualifier: Diagnosis of  By: Jenny Reichmann MD, Hunt Oris   . DEPRESSION 02/12/2008    Qualifier: Diagnosis of  By: Elveria Royals   . HYPERTENSION 02/12/2008    Qualifier: Diagnosis of  By: Elveria Royals   . VITAMIN D DEFICIENCY 04/14/2010    Qualifier: Diagnosis of  By: Jenny Reichmann MD, Hunt Oris   . OSTEOPENIA 02/12/2008    Qualifier: Diagnosis of  By: Jenny Reichmann MD, Hunt Oris    History reviewed. No pertinent past surgical history.  reports that she has been smoking.  She has never used smokeless tobacco. She reports that she drinks alcohol. She reports that she does not use illicit drugs. family history includes Bipolar disorder in her sister and Heart disease in her father.  There  is no history of Diabetes. Allergies  Allergen Reactions  . Epinephrine   . Influenza Virus Vaccine Split     fatigue   Current Outpatient Prescriptions on File Prior to Visit  Medication Sig Dispense Refill  . levothyroxine (SYNTHROID) 100 MCG tablet Take 1 tablet (100 mcg total) by mouth daily.  90 tablet  3  . lisinopril (ZESTRIL) 10 MG tablet Take 1 tablet (10 mg total) by mouth daily.  90 tablet  3  . lovastatin (MEVACOR) 20 MG tablet Take 1 tablet (20 mg total) by mouth daily.  90 tablet  3  . PARoxetine (PAXIL) 20 MG tablet Take 80 mg by mouth every morning. (4 tabs per day)  360 tablet  3  . buPROPion (WELLBUTRIN XL) 150 MG 24 hr tablet Take 1 tablet (150 mg total) by mouth daily.  90 tablet  3    Review of Systems Constitutional: Negative for diaphoresis, activity change, appetite change or unexpected weight change.  HENT: Negative for hearing loss, ear pain, facial swelling, mouth sores and neck stiffness.   Eyes: Negative for pain, redness and visual disturbance.  Respiratory: Negative for shortness of breath and wheezing.   Cardiovascular: Negative for chest pain and palpitations.  Gastrointestinal: Negative for diarrhea, blood in stool, abdominal distention or other pain Genitourinary: Negative for hematuria, flank pain or change in urine volume.  Musculoskeletal: Negative for myalgias and joint swelling.  Skin: Negative for color change and wound.  Neurological: Negative  for syncope and numbness. other than noted Hematological: Negative for adenopathy.  Psychiatric/Behavioral: Negative for hallucinations, self-injury, decreased concentration and agitation.      Objective:   Physical Exam BP 138/82  Pulse 96  Temp 98.5 F (36.9 C) (Oral)  Ht 5\' 3"  (1.6 m)  Wt 147 lb (66.679 kg)  BMI 26.04 kg/m2  SpO2 95% VS noted, not ill appearing Constitutional: Pt is oriented to person, place, and time. Appears well-developed and well-nourished.  Head: Normocephalic and  atraumatic.  Right Ear: External ear normal.  Left Ear: External ear normal.  Nose: Nose normal.  Mouth/Throat: Oropharynx is clear and moist.  Eyes: Conjunctivae and EOM are normal. Pupils are equal, round, and reactive to light.  Neck: Normal range of motion. Neck supple. No JVD present. No tracheal deviation present.  Cardiovascular: Normal rate, regular rhythm, normal heart sounds and intact distal pulses.   Pulmonary/Chest: Effort normal and breath sounds normal.  Abdominal: Soft. Bowel sounds are normal. There is no tenderness. No HSM  Musculoskeletal: Normal range of motion. Exhibits no edema.  Lymphadenopathy:  Has no cervical adenopathy.  Neurological: Pt is alert and oriented to person, place, and time. Pt has normal reflexes. No cranial nerve deficit.  Skin: Skin is warm and dry. No rash noted.  Psychiatric:  Has depressed mood and affect. Behavior is normal. 1+ nervous    Assessment & Plan:

## 2012-10-19 NOTE — Patient Instructions (Addendum)
Please continue all other medications as before, and refills have been done as requested. Please continue your efforts at being more active, low cholesterol diet, and weight control. Please go to the LAB in the Basement (turn left off the elevator) for the tests to be done today You will be contacted by phone if any changes need to be made immediately.  Otherwise, you will receive a letter about your results with an explanation, but please check with MyChart first. Please check your insurance to see if the colonoscopy is covered, and if so let us know to have it ordered Please check your insurance to see if the shingles shot is covered, and if so, please return for the shingles shot at a Nurse Visit Thank you for enrolling in [facility]. Please follow the instructions below to securely access your online medical record. MyChart allows you to send messages to your doctor, view your test results, renew your prescriptions, schedule appointments, and more. To Log into My Chart online, please go by [employer] or [employer] to [email or web address], or download the MyChart App from the [employer].  Your Username is: [email or web address] Please send a practice Message on Mychart later today. You will be contacted regarding the referral for: pulmonary You are given the list of psychiatrists in the area to consider making an appointment Please return in 1 year for your yearly visit, or sooner if needed, with Lab testing done 3-5 days before

## 2012-10-20 ENCOUNTER — Encounter: Payer: Self-pay | Admitting: Internal Medicine

## 2012-11-07 ENCOUNTER — Institutional Professional Consult (permissible substitution): Payer: No Typology Code available for payment source | Admitting: Pulmonary Disease

## 2012-11-17 ENCOUNTER — Other Ambulatory Visit: Payer: Self-pay

## 2012-12-17 ENCOUNTER — Telehealth: Payer: Self-pay | Admitting: Internal Medicine

## 2012-12-17 NOTE — Telephone Encounter (Signed)
Pt called back stating she has been getting this medication from her sister. She says that she had previously taken "version" of this medication prescribed by her Psychiatrist but she no longer wants to continue care with them because they are "dangerous'. Pt was advised that she can change psychiatric providers if she is no longer satisfied with care but pt declined. Pt insisted on appt to discuss getting this prescription ASAP (scheduled 03/25). Please advise, pt is adamant that Dr Jenny Reichmann begin prescribing this medication for her.

## 2012-12-17 NOTE — Telephone Encounter (Signed)
Pt called req refill for generic of Adderall. Pt stated that Dr. Jenny Reichmann didn't not write this med, but pt would like Dr. Jenny Reichmann to take care of this med. Please call pt if this is ok.

## 2012-12-17 NOTE — Telephone Encounter (Signed)
Pt advised per [place] to bring a record of previous similar prescription and any other supporting documentation from other physician to upcoming appt for review.

## 2012-12-25 ENCOUNTER — Ambulatory Visit: Payer: No Typology Code available for payment source | Admitting: Internal Medicine

## 2013-08-08 ENCOUNTER — Other Ambulatory Visit: Payer: Self-pay

## 2013-10-09 ENCOUNTER — Other Ambulatory Visit: Payer: Self-pay | Admitting: Internal Medicine

## 2013-10-15 ENCOUNTER — Other Ambulatory Visit: Payer: Self-pay

## 2013-10-15 MED ORDER — PAROXETINE HCL 20 MG PO TABS: ORAL_TABLET | ORAL | Status: DC

## 2013-10-15 NOTE — Telephone Encounter (Signed)
Ok for one mo refill,  \ Due for ROV please, last seen jan 2014

## 2013-10-24 ENCOUNTER — Encounter: Payer: No Typology Code available for payment source | Admitting: Internal Medicine

## 2013-10-30 ENCOUNTER — Encounter: Payer: No Typology Code available for payment source | Admitting: Internal Medicine

## 2013-11-03 ENCOUNTER — Other Ambulatory Visit: Payer: Self-pay | Admitting: Internal Medicine

## 2013-11-07 ENCOUNTER — Other Ambulatory Visit: Payer: Self-pay | Admitting: Internal Medicine

## 2013-11-08 ENCOUNTER — Encounter: Payer: No Typology Code available for payment source | Admitting: Internal Medicine

## 2013-12-10 ENCOUNTER — Other Ambulatory Visit: Payer: Self-pay | Admitting: Obstetrics and Gynecology

## 2013-12-27 ENCOUNTER — Encounter: Payer: Self-pay | Admitting: Internal Medicine

## 2013-12-27 ENCOUNTER — Other Ambulatory Visit (INDEPENDENT_AMBULATORY_CARE_PROVIDER_SITE_OTHER): Payer: BC Managed Care – PPO

## 2013-12-27 ENCOUNTER — Ambulatory Visit (INDEPENDENT_AMBULATORY_CARE_PROVIDER_SITE_OTHER): Payer: BC Managed Care – PPO | Admitting: Internal Medicine

## 2013-12-27 VITALS — BP 122/80 | HR 78 | Temp 98.3°F | Ht 62.0 in | Wt 121.8 lb

## 2013-12-27 DIAGNOSIS — Z0000 Encounter for general adult medical examination without abnormal findings: Secondary | ICD-10-CM

## 2013-12-27 DIAGNOSIS — R7302 Impaired glucose tolerance (oral): Secondary | ICD-10-CM

## 2013-12-27 DIAGNOSIS — Z136 Encounter for screening for cardiovascular disorders: Secondary | ICD-10-CM

## 2013-12-27 DIAGNOSIS — R7309 Other abnormal glucose: Secondary | ICD-10-CM

## 2013-12-27 LAB — CBC WITH DIFFERENTIAL/PLATELET
Basophils Absolute: 0 10*3/uL (ref 0.0–0.1)
Basophils Relative: 0.5 % (ref 0.0–3.0)
EOS PCT: 2.1 % (ref 0.0–5.0)
Eosinophils Absolute: 0.2 10*3/uL (ref 0.0–0.7)
HEMATOCRIT: 44 % (ref 36.0–46.0)
HEMOGLOBIN: 14.7 g/dL (ref 12.0–15.0)
LYMPHS ABS: 2.2 10*3/uL (ref 0.7–4.0)
Lymphocytes Relative: 29.7 % (ref 12.0–46.0)
MCHC: 33.4 g/dL (ref 30.0–36.0)
MCV: 100 fl (ref 78.0–100.0)
Monocytes Absolute: 0.7 10*3/uL (ref 0.1–1.0)
Monocytes Relative: 9.7 % (ref 3.0–12.0)
NEUTROS ABS: 4.3 10*3/uL (ref 1.4–7.7)
Neutrophils Relative %: 58 % (ref 43.0–77.0)
Platelets: 218 10*3/uL (ref 150.0–400.0)
RBC: 4.4 Mil/uL (ref 3.87–5.11)
RDW: 14.9 % — ABNORMAL HIGH (ref 11.5–14.6)
WBC: 7.4 10*3/uL (ref 4.5–10.5)

## 2013-12-27 LAB — LIPID PANEL
Cholesterol: 202 mg/dL — ABNORMAL HIGH (ref 0–200)
HDL: 73.3 mg/dL (ref >39.00)
LDL CALC: 114 mg/dL — AB (ref 0–99)
Total CHOL/HDL Ratio: 3
Triglycerides: 76 mg/dL (ref 0.0–149.0)
VLDL: 15.2 mg/dL (ref 0.0–40.0)

## 2013-12-27 LAB — HEPATIC FUNCTION PANEL
ALK PHOS: 46 U/L (ref 39–117)
ALT: 20 U/L (ref 0–35)
AST: 24 U/L (ref 0–37)
Albumin: 4.3 g/dL (ref 3.5–5.2)
BILIRUBIN TOTAL: 0.6 mg/dL (ref 0.3–1.2)
Bilirubin, Direct: 0.1 mg/dL (ref 0.0–0.3)
Total Protein: 7 g/dL (ref 6.0–8.3)

## 2013-12-27 LAB — BASIC METABOLIC PANEL
BUN: 18 mg/dL (ref 6–23)
CALCIUM: 9.3 mg/dL (ref 8.4–10.5)
CO2: 30 mEq/L (ref 19–32)
Chloride: 103 mEq/L (ref 96–112)
Creatinine, Ser: 0.9 mg/dL (ref 0.4–1.2)
GFR: 70.45 mL/min (ref >60.00)
GLUCOSE: 120 mg/dL — AB (ref 70–99)
Potassium: 4.3 mEq/L (ref 3.5–5.1)
SODIUM: 139 meq/L (ref 135–145)

## 2013-12-27 LAB — URINALYSIS, ROUTINE W REFLEX MICROSCOPIC
Bilirubin Urine: NEGATIVE
Hgb urine dipstick: NEGATIVE
KETONES UR: NEGATIVE
Leukocytes, UA: NEGATIVE
Nitrite: NEGATIVE
PH: 5.5 (ref 5.0–8.0)
Specific Gravity, Urine: >=1.03 — AB (ref 1.000–1.030)
Total Protein, Urine: NEGATIVE
URINE GLUCOSE: NEGATIVE
Urobilinogen, UA: 0.2 (ref 0.0–1.0)

## 2013-12-27 LAB — TSH: TSH: 0.59 u[IU]/mL (ref 0.35–5.50)

## 2013-12-27 LAB — HEMOGLOBIN A1C: HEMOGLOBIN A1C: 6.1 % (ref 4.6–6.5)

## 2013-12-27 MED ORDER — LISINOPRIL 10 MG PO TABS: 10.0000 mg | ORAL_TABLET | Freq: Every day | ORAL | Status: DC

## 2013-12-27 MED ORDER — LEVOTHYROXINE SODIUM 100 MCG PO TABS: 100.0000 ug | ORAL_TABLET | Freq: Every day | ORAL | Status: DC

## 2013-12-27 MED ORDER — LOVASTATIN 20 MG PO TABS: 20.0000 mg | ORAL_TABLET | Freq: Every day | ORAL | Status: DC

## 2013-12-27 NOTE — Progress Notes (Signed)
Subjective:    Patient ID: Susan Davidson, female    DOB: [birth year], 63 y.o.   MRN: [id number]  HPI Here for wellness and f/u;  Overall doing ok;  Pt denies CP, worsening SOB, DOE, wheezing, orthopnea, PND, worsening LE edema, palpitations, dizziness or syncope.  Pt denies neurological change such as new headache, facial or extremity weakness.  Pt denies polydipsia, polyuria, or low sugar symptoms. Pt states overall good compliance with treatment and medications, good tolerability, and has been trying to follow lower cholesterol diet.  Pt denies worsening depressive symptoms, suicidal ideation or panic. No fever, night sweats, wt loss, loss of appetite, or other constitutional symptoms.  Pt states good ability with ADL's, has low fall risk, home safety reviewed and adequate, no other significant changes in hearing or vision, and only occasionally active with exercise.  Sees Dr Reddy/psychiatrist for ADD and depression.  Had pap < 1 yr and due for mammogram in April.  Wants to wai for colonscopy until after 63yo. Past Medical History  Diagnosis Date  . Impaired glucose tolerance 08/27/2011  . HYPOTHYROIDISM 02/12/2008    Qualifier: Diagnosis of  By: Elveria Royals   . HYPERLIPIDEMIA 02/12/2008    Qualifier: Diagnosis of  By: Elveria Royals   . ANXIETY 02/12/2008    Qualifier: Diagnosis of  By: Jenny Reichmann MD, Hunt Oris   . DEPRESSION 02/12/2008    Qualifier: Diagnosis of  By: Elveria Royals   . HYPERTENSION 02/12/2008    Qualifier: Diagnosis of  By: Elveria Royals   . VITAMIN D DEFICIENCY 04/14/2010    Qualifier: Diagnosis of  By: Jenny Reichmann MD, Hunt Oris   . OSTEOPENIA 02/12/2008    Qualifier: Diagnosis of  By: Jenny Reichmann MD, Hunt Oris    No past surgical history on file.  reports that she has been smoking.  She has never used smokeless tobacco. She reports that she drinks alcohol. She reports that she does not use illicit drugs. family history includes Bipolar disorder in her sister;  Heart disease in her father. There is no history of Diabetes. Allergies  Allergen Reactions  . Epinephrine   . Influenza Virus Vaccine Split     fatigue   Current Outpatient Prescriptions on File Prior to Visit  Medication Sig Dispense Refill  . ALPRAZolam (XANAX) 0.5 MG tablet Take 0.5 mg by mouth 6 (six) times daily.      [name] Kitchen PARoxetine (PAXIL) 20 MG tablet Take 80 mg by mouth every morning. (4 tabs per day)  360 tablet  0  . buPROPion (WELLBUTRIN XL) 150 MG 24 hr tablet Take 1 tablet (150 mg total) by mouth daily.  90 tablet  3   No current facility-administered medications on file prior to visit.    Review of Systems Constitutional: Negative for diaphoresis, activity change, appetite change or unexpected weight change.  HENT: Negative for hearing loss, ear pain, facial swelling, mouth sores and neck stiffness.   Eyes: Negative for pain, redness and visual disturbance.  Respiratory: Negative for shortness of breath and wheezing.   Cardiovascular: Negative for chest pain and palpitations.  Gastrointestinal: Negative for diarrhea, blood in stool, abdominal distention or other pain Genitourinary: Negative for hematuria, flank pain or change in urine volume.  Musculoskeletal: Negative for myalgias and joint swelling.  Skin: Negative for color change and wound.  Neurological: Negative for syncope and numbness. other than noted Hematological: Negative for adenopathy.  Psychiatric/Behavioral: Negative for hallucinations, self-injury, decreased concentration and agitation.  Objective:   Physical Exam BP 122/80  Pulse 78  Temp(Src) 98.3 F (36.8 C) (Oral)  Ht 5\' 2"  (1.575 m)  Wt 121 lb 12 oz (55.225 kg)  BMI 22.26 kg/m2  SpO2 98% VS noted,  Constitutional: Pt is oriented to person, place, and time. Appears well-developed and well-nourished.  Head: Normocephalic and atraumatic.  Right Ear: External ear normal.  Left Ear: External ear normal.  Nose: Nose normal.    Mouth/Throat: Oropharynx is clear and moist.  Eyes: Conjunctivae and EOM are normal. Pupils are equal, round, and reactive to light.  Neck: Normal range of motion. Neck supple. No JVD present. No tracheal deviation present.  Cardiovascular: Normal rate, regular rhythm, normal heart sounds and intact distal pulses.   Pulmonary/Chest: Effort normal and breath sounds normal.  Abdominal: Soft. Bowel sounds are normal. There is no tenderness. No HSM  Musculoskeletal: Normal range of motion. Exhibits no edema.  Lymphadenopathy:  Has no cervical adenopathy.  Neurological: Pt is alert and oriented to person, place, and time. Pt has normal reflexes. No cranial nerve deficit.  Skin: Skin is warm and dry. No rash noted.  Psychiatric:  Has somewhat anxious mood and affect. Behavior is normal.        Assessment & Plan:

## 2013-12-27 NOTE — Progress Notes (Signed)
Pre visit review using our clinic review tool, if applicable. No additional management support is needed unless otherwise documented below in the visit note. 

## 2013-12-27 NOTE — Assessment & Plan Note (Signed)
Asymtp, for a1c 

## 2013-12-27 NOTE — Assessment & Plan Note (Addendum)

## 2013-12-27 NOTE — Patient Instructions (Addendum)
Your EKG was OK today  Please continue all other medications as before, and refills have been done if requested. Please have the pharmacy call with any other refills you may need.  Please continue your efforts at being more active, low cholesterol diet, and weight control. You are otherwise up to date with prevention measures today.  Please keep your appointments with your specialists as you have planned  Please go to the LAB in the Basement (turn left off the elevator) for the tests to be done today You will be contacted by phone if any changes need to be made immediately.  Otherwise, you will receive a letter about your results with an explanation, but please check with MyChart first.  Please return in 1 year for your yearly visit, or sooner if needed

## 2013-12-30 ENCOUNTER — Telehealth: Payer: Self-pay | Admitting: Internal Medicine

## 2013-12-30 NOTE — Telephone Encounter (Signed)
Relevant patient education assigned to patient using Emmi. ° °

## 2014-01-04 ENCOUNTER — Other Ambulatory Visit: Payer: Self-pay | Admitting: Internal Medicine

## 2014-01-08 ENCOUNTER — Other Ambulatory Visit: Payer: Self-pay

## 2014-01-08 MED ORDER — LISINOPRIL 10 MG PO TABS: 10.0000 mg | ORAL_TABLET | Freq: Every day | ORAL | Status: DC

## 2014-01-30 ENCOUNTER — Other Ambulatory Visit: Payer: Self-pay | Admitting: Internal Medicine

## 2014-05-28 ENCOUNTER — Other Ambulatory Visit: Payer: Self-pay

## 2014-05-28 MED ORDER — LOVASTATIN 20 MG PO TABS: 20.0000 mg | ORAL_TABLET | Freq: Every day | ORAL | Status: DC

## 2014-05-28 MED ORDER — PAROXETINE HCL 20 MG PO TABS: ORAL_TABLET | ORAL | Status: DC

## 2014-05-28 MED ORDER — LEVOTHYROXINE SODIUM 100 MCG PO TABS: 100.0000 ug | ORAL_TABLET | Freq: Every day | ORAL | Status: DC

## 2014-05-28 MED ORDER — LISINOPRIL 10 MG PO TABS: 10.0000 mg | ORAL_TABLET | Freq: Every day | ORAL | Status: DC

## 2014-05-29 ENCOUNTER — Telehealth: Payer: Self-pay | Admitting: Internal Medicine

## 2014-05-29 NOTE — Addendum Note (Signed)
Addended by: Sharon Seller B on: 05/29/2014 09:11 AM   Modules accepted: Orders

## 2014-05-29 NOTE — Telephone Encounter (Signed)
Called the patient left a detailed message of MD instructions on refill.  Also did inform the pharmacy as well.

## 2014-05-29 NOTE — Telephone Encounter (Addendum)
I dont see where I have been the prescriber for this med - xanax, in the past, and the rx is for very unsuual high dose.  Perhaps the pt was seeing psychiatry?  I would not feel comfortable with prescribing this much medication for the long term

## 2014-05-29 NOTE — Addendum Note (Signed)
Addended by: Biagio Borg on: 05/29/2014 12:42 PM   Modules accepted: Orders

## 2014-05-29 NOTE — Telephone Encounter (Signed)
Error

## 2014-12-11 DIAGNOSIS — F3181 Bipolar II disorder: Secondary | ICD-10-CM | POA: Diagnosis not present

## 2014-12-11 DIAGNOSIS — F902 Attention-deficit hyperactivity disorder, combined type: Secondary | ICD-10-CM | POA: Diagnosis not present

## 2014-12-30 ENCOUNTER — Encounter: Payer: BC Managed Care – PPO | Admitting: Internal Medicine

## 2015-01-07 ENCOUNTER — Other Ambulatory Visit: Payer: Self-pay | Admitting: Internal Medicine

## 2015-01-09 ENCOUNTER — Other Ambulatory Visit: Payer: Self-pay | Admitting: Internal Medicine

## 2015-01-15 ENCOUNTER — Encounter: Payer: BC Managed Care – PPO | Admitting: Internal Medicine

## 2015-01-22 ENCOUNTER — Other Ambulatory Visit: Payer: Self-pay | Admitting: Internal Medicine

## 2015-01-22 ENCOUNTER — Ambulatory Visit (INDEPENDENT_AMBULATORY_CARE_PROVIDER_SITE_OTHER): Payer: Commercial Managed Care - HMO | Admitting: Internal Medicine

## 2015-01-22 ENCOUNTER — Encounter: Payer: Self-pay | Admitting: Internal Medicine

## 2015-01-22 ENCOUNTER — Other Ambulatory Visit (INDEPENDENT_AMBULATORY_CARE_PROVIDER_SITE_OTHER): Payer: Commercial Managed Care - HMO

## 2015-01-22 VITALS — BP 126/80 | HR 83 | Temp 98.1°F | Resp 18 | Ht 62.0 in | Wt 127.1 lb

## 2015-01-22 DIAGNOSIS — R7302 Impaired glucose tolerance (oral): Secondary | ICD-10-CM | POA: Diagnosis not present

## 2015-01-22 DIAGNOSIS — Z0000 Encounter for general adult medical examination without abnormal findings: Secondary | ICD-10-CM | POA: Diagnosis not present

## 2015-01-22 LAB — CBC WITH DIFFERENTIAL/PLATELET
BASOS ABS: 0 10*3/uL (ref 0.0–0.1)
Basophils Relative: 0.4 % (ref 0.0–3.0)
EOS ABS: 0.1 10*3/uL (ref 0.0–0.7)
Eosinophils Relative: 1.3 % (ref 0.0–5.0)
HCT: 43.9 % (ref 36.0–46.0)
Hemoglobin: 14.9 g/dL (ref 12.0–15.0)
Lymphocytes Relative: 33.6 % (ref 12.0–46.0)
Lymphs Abs: 2.3 10*3/uL (ref 0.7–4.0)
MCHC: 33.8 g/dL (ref 30.0–36.0)
MCV: 97 fl (ref 78.0–100.0)
Monocytes Absolute: 0.6 10*3/uL (ref 0.1–1.0)
Monocytes Relative: 8.7 % (ref 3.0–12.0)
Neutro Abs: 3.8 10*3/uL (ref 1.4–7.7)
Neutrophils Relative %: 56 % (ref 43.0–77.0)
Platelets: 205 10*3/uL (ref 150.0–400.0)
RBC: 4.53 Mil/uL (ref 3.87–5.11)
RDW: 14.2 % (ref 11.5–15.5)
WBC: 6.8 10*3/uL (ref 4.0–10.5)

## 2015-01-22 LAB — BASIC METABOLIC PANEL
BUN: 16 mg/dL (ref 6–23)
CHLORIDE: 103 meq/L (ref 96–112)
CO2: 30 mEq/L (ref 19–32)
Calcium: 9.3 mg/dL (ref 8.4–10.5)
Creatinine, Ser: 0.91 mg/dL (ref 0.40–1.20)
GFR: 65.78 mL/min (ref >60.00)
Glucose, Bld: 114 mg/dL — ABNORMAL HIGH (ref 70–99)
POTASSIUM: 4.2 meq/L (ref 3.5–5.1)
Sodium: 137 mEq/L (ref 135–145)

## 2015-01-22 LAB — URINALYSIS, ROUTINE W REFLEX MICROSCOPIC
Bilirubin Urine: NEGATIVE
HGB URINE DIPSTICK: NEGATIVE
Ketones, ur: NEGATIVE
Leukocytes, UA: NEGATIVE
Nitrite: NEGATIVE
SPECIFIC GRAVITY, URINE: 1.02 (ref 1.000–1.030)
Total Protein, Urine: NEGATIVE
URINE GLUCOSE: NEGATIVE
Urobilinogen, UA: 0.2 (ref 0.0–1.0)
pH: 5.5 (ref 5.0–8.0)

## 2015-01-22 LAB — HEPATIC FUNCTION PANEL
ALT: 19 U/L (ref 0–35)
AST: 23 U/L (ref 0–37)
Albumin: 4.2 g/dL (ref 3.5–5.2)
Alkaline Phosphatase: 56 U/L (ref 39–117)
Bilirubin, Direct: 0.1 mg/dL (ref 0.0–0.3)
Total Bilirubin: 0.4 mg/dL (ref 0.2–1.2)
Total Protein: 7 g/dL (ref 6.0–8.3)

## 2015-01-22 LAB — LIPID PANEL
CHOLESTEROL: 186 mg/dL (ref 0–200)
HDL: 64.6 mg/dL (ref >39.00)
LDL Cholesterol: 101 mg/dL — ABNORMAL HIGH (ref 0–99)
NONHDL: 121.4
Total CHOL/HDL Ratio: 3
Triglycerides: 102 mg/dL (ref 0.0–149.0)
VLDL: 20.4 mg/dL (ref 0.0–40.0)

## 2015-01-22 LAB — TSH: TSH: 0.17 u[IU]/mL — AB (ref 0.35–4.50)

## 2015-01-22 LAB — HEMOGLOBIN A1C: HEMOGLOBIN A1C: 6.2 % (ref 4.6–6.5)

## 2015-01-22 MED ORDER — LEVOTHYROXINE SODIUM 88 MCG PO TABS: 88.0000 ug | ORAL_TABLET | Freq: Every day | ORAL | Status: DC

## 2015-01-22 NOTE — Assessment & Plan Note (Signed)

## 2015-01-22 NOTE — Assessment & Plan Note (Signed)
stable overall by history and exam, recent data reviewed with pt, and pt to continue medical treatment as before,  to f/u any worsening symptoms or concerns . For a1c

## 2015-01-22 NOTE — Patient Instructions (Signed)

## 2015-01-22 NOTE — Progress Notes (Signed)
Subjective:    Patient ID: Susan Davidson, female    DOB: [birth year], 64 y.o.   MRN: [id number]  HPI  Here for wellness and f/u;  Overall doing ok;  Pt denies Chest pain, worsening SOB, DOE, wheezing, orthopnea, PND, worsening LE edema, palpitations, dizziness or syncope.  Pt denies neurological change such as new headache, facial or extremity weakness.  Pt denies polydipsia, polyuria, or low sugar symptoms. Pt states overall good compliance with treatment and medications, good tolerability, and has been trying to follow appropriate diet.  Pt denies worsening depressive symptoms, suicidal ideation or panic. No fever, night sweats, wt loss, loss of appetite, or other constitutional symptoms.  Pt states good ability with ADL's, has low fall risk, home safety reviewed and adequate, no other significant changes in hearing or vision, and only occasionally active with exercise. Declines all immunizations since a rxn to flu shot yrs ago.  Now on adderall and doing well.  Lost overall 30 lbs per pt on this.   Wt Readings from Last 3 Encounters:  01/22/15 127 lb 1.3 oz (57.643 kg)  12/27/13 121 lb 12 oz (55.225 kg)  10/19/12 147 lb (66.679 kg)    Past Medical History  Diagnosis Date  . Impaired glucose tolerance 08/27/2011  . HYPOTHYROIDISM 02/12/2008    Qualifier: Diagnosis of  By: Elveria Royals   . HYPERLIPIDEMIA 02/12/2008    Qualifier: Diagnosis of  By: Elveria Royals   . ANXIETY 02/12/2008    Qualifier: Diagnosis of  By: Jenny Reichmann MD, Hunt Oris   . DEPRESSION 02/12/2008    Qualifier: Diagnosis of  By: Elveria Royals   . HYPERTENSION 02/12/2008    Qualifier: Diagnosis of  By: Elveria Royals   . VITAMIN D DEFICIENCY 04/14/2010    Qualifier: Diagnosis of  By: Jenny Reichmann MD, Hunt Oris   . OSTEOPENIA 02/12/2008    Qualifier: Diagnosis of  By: Jenny Reichmann MD, Hunt Oris    No past surgical history on file.  reports that she has been smoking.  She has never used smokeless tobacco. She  reports that she drinks alcohol. She reports that she does not use illicit drugs. family history includes Bipolar disorder in her sister; Heart disease in her father. There is no history of Diabetes. Allergies  Allergen Reactions  . Other Anaphylaxis    Fine ant venom  . Epinephrine   . Influenza Virus Vaccine Split     fatigue    Review of Systems Constitutional: Negative for increased diaphoresis, other activity, appetite or siginficant weight change other than noted HENT: Negative for worsening hearing loss, ear pain, facial swelling, mouth sores and neck stiffness.   Eyes: Negative for other worsening pain, redness or visual disturbance.  Respiratory: Negative for shortness of breath and wheezing  Cardiovascular: Negative for chest pain and palpitations.  Gastrointestinal: Negative for diarrhea, blood in stool, abdominal distention or other pain Genitourinary: Negative for hematuria, flank pain or change in urine volume.  Musculoskeletal: Negative for myalgias or other joint complaints.  Skin: Negative for color change and wound or drainage.  Neurological: Negative for syncope and numbness. other than noted Hematological: Negative for adenopathy. or other swelling Psychiatric/Behavioral: Negative for hallucinations, SI, self-injury, decreased concentration or other worsening agitation.      Objective:   Physical Exam BP 126/80 mmHg  Pulse 83  Temp(Src) 98.1 F (36.7 C) (Oral)  Resp 18  Ht 5\' 2"  (1.575 m)  Wt 127 lb 1.3 oz (57.643 kg)  BMI  23.24 kg/m2  SpO2 97% VS noted,  Constitutional: Pt is oriented to person, place, and time. Appears well-developed and well-nourished, in no significant distress Head: Normocephalic and atraumatic.  Right Ear: External ear normal.  Left Ear: External ear normal.  Nose: Nose normal.  Mouth/Throat: Oropharynx is clear and moist.  Eyes: Conjunctivae and EOM are normal. Pupils are equal, round, and reactive to light.  Neck: Normal range  of motion. Neck supple. No JVD present. No tracheal deviation present or significant neck LA or mass Cardiovascular: Normal rate, regular rhythm, normal heart sounds and intact distal pulses.   Pulmonary/Chest: Effort normal and breath sounds without rales or wheezing  Abdominal: Soft. Bowel sounds are normal. NT. No HSM  Musculoskeletal: Normal range of motion. Exhibits no edema.  Lymphadenopathy:  Has no cervical adenopathy.  Neurological: Pt is alert and oriented to person, place, and time. Pt has normal reflexes. No cranial nerve deficit. Motor grossly intact Skin: Skin is warm and dry. No rash noted.  Psychiatric:  Has normal mood and affect. Behavior is normal.     Assessment & Plan:

## 2015-01-22 NOTE — Progress Notes (Signed)
Pre visit review using our clinic review tool, if applicable. No additional management support is needed unless otherwise documented below in the visit note. 

## 2015-03-10 DIAGNOSIS — F3181 Bipolar II disorder: Secondary | ICD-10-CM | POA: Diagnosis not present

## 2015-03-11 DIAGNOSIS — Z79899 Other long term (current) drug therapy: Secondary | ICD-10-CM | POA: Diagnosis not present

## 2015-03-11 DIAGNOSIS — F3181 Bipolar II disorder: Secondary | ICD-10-CM | POA: Diagnosis not present

## 2015-03-11 DIAGNOSIS — F902 Attention-deficit hyperactivity disorder, combined type: Secondary | ICD-10-CM | POA: Diagnosis not present

## 2015-04-17 ENCOUNTER — Telehealth: Payer: Self-pay | Admitting: Internal Medicine

## 2015-04-17 NOTE — Telephone Encounter (Signed)
Called patient to see if a recent mammogram has been done, patient states the she will send records through her my chart.

## 2015-06-02 DIAGNOSIS — F3181 Bipolar II disorder: Secondary | ICD-10-CM | POA: Diagnosis not present

## 2015-06-02 DIAGNOSIS — F902 Attention-deficit hyperactivity disorder, combined type: Secondary | ICD-10-CM | POA: Diagnosis not present

## 2015-06-09 ENCOUNTER — Other Ambulatory Visit: Payer: Self-pay | Admitting: Internal Medicine

## 2015-09-01 DIAGNOSIS — F902 Attention-deficit hyperactivity disorder, combined type: Secondary | ICD-10-CM | POA: Diagnosis not present

## 2015-09-01 DIAGNOSIS — F3181 Bipolar II disorder: Secondary | ICD-10-CM | POA: Diagnosis not present

## 2015-09-16 ENCOUNTER — Other Ambulatory Visit: Payer: Self-pay | Admitting: Internal Medicine

## 2015-11-25 DIAGNOSIS — F3181 Bipolar II disorder: Secondary | ICD-10-CM | POA: Diagnosis not present

## 2015-11-25 DIAGNOSIS — F902 Attention-deficit hyperactivity disorder, combined type: Secondary | ICD-10-CM | POA: Diagnosis not present

## 2016-02-25 DIAGNOSIS — F3181 Bipolar II disorder: Secondary | ICD-10-CM | POA: Diagnosis not present

## 2016-03-17 ENCOUNTER — Other Ambulatory Visit: Payer: Self-pay

## 2016-03-17 MED ORDER — LOVASTATIN 20 MG PO TABS
20.0000 mg | ORAL_TABLET | Freq: Every day | ORAL | Status: DC
Start: 2016-03-17 — End: 2016-07-05

## 2016-03-17 MED ORDER — LEVOTHYROXINE SODIUM 100 MCG PO TABS: 100.0000 ug | ORAL_TABLET | Freq: Every day | ORAL | Status: DC

## 2016-03-31 ENCOUNTER — Other Ambulatory Visit: Payer: Self-pay | Admitting: *Deleted

## 2016-03-31 MED ORDER — LISINOPRIL 10 MG PO TABS
10.0000 mg | ORAL_TABLET | Freq: Every day | ORAL | Status: DC
Start: 2016-03-31 — End: 2016-05-05

## 2016-03-31 NOTE — Telephone Encounter (Signed)
Received call pt states she is needing refill on her Lisinopril. Inform pt per chart she is overdue for yearly cpx. Can only send 30 day until appt is made. Made appt for 04/14/16 sent 30 to [facility]...Johny Chess

## 2016-04-14 ENCOUNTER — Encounter: Payer: Self-pay | Admitting: Internal Medicine

## 2016-04-14 ENCOUNTER — Ambulatory Visit (INDEPENDENT_AMBULATORY_CARE_PROVIDER_SITE_OTHER): Payer: PPO | Admitting: Internal Medicine

## 2016-04-14 ENCOUNTER — Other Ambulatory Visit (INDEPENDENT_AMBULATORY_CARE_PROVIDER_SITE_OTHER): Payer: PPO

## 2016-04-14 VITALS — BP 132/80 | HR 86 | Temp 98.3°F | Resp 20 | Wt 131.0 lb

## 2016-04-14 DIAGNOSIS — Z0000 Encounter for general adult medical examination without abnormal findings: Secondary | ICD-10-CM

## 2016-04-14 DIAGNOSIS — R7302 Impaired glucose tolerance (oral): Secondary | ICD-10-CM | POA: Diagnosis not present

## 2016-04-14 DIAGNOSIS — I10 Essential (primary) hypertension: Secondary | ICD-10-CM | POA: Diagnosis not present

## 2016-04-14 LAB — URINALYSIS, ROUTINE W REFLEX MICROSCOPIC
BILIRUBIN URINE: NEGATIVE
Hgb urine dipstick: NEGATIVE
NITRITE: NEGATIVE
PH: 5.5 (ref 5.0–8.0)
RBC / HPF: NONE SEEN (ref 0–?)
SPECIFIC GRAVITY, URINE: 1.025 (ref 1.000–1.030)
Total Protein, Urine: NEGATIVE
Urine Glucose: NEGATIVE
Urobilinogen, UA: 0.2 (ref 0.0–1.0)

## 2016-04-14 LAB — CBC WITH DIFFERENTIAL/PLATELET
BASOS PCT: 0.7 % (ref 0.0–3.0)
Basophils Absolute: 0.1 10*3/uL (ref 0.0–0.1)
EOS ABS: 0.1 10*3/uL (ref 0.0–0.7)
EOS PCT: 1.1 % (ref 0.0–5.0)
HCT: 43.7 % (ref 36.0–46.0)
HEMOGLOBIN: 14.7 g/dL (ref 12.0–15.0)
LYMPHS ABS: 2.5 10*3/uL (ref 0.7–4.0)
Lymphocytes Relative: 34.7 % (ref 12.0–46.0)
MCHC: 33.7 g/dL (ref 30.0–36.0)
MCV: 97.9 fl (ref 78.0–100.0)
MONO ABS: 0.6 10*3/uL (ref 0.1–1.0)
Monocytes Relative: 8.8 % (ref 3.0–12.0)
NEUTROS ABS: 4 10*3/uL (ref 1.4–7.7)
Neutrophils Relative %: 54.7 % (ref 43.0–77.0)
PLATELETS: 212 10*3/uL (ref 150.0–400.0)
RBC: 4.47 Mil/uL (ref 3.87–5.11)
RDW: 14.9 % (ref 11.5–15.5)
WBC: 7.3 10*3/uL (ref 4.0–10.5)

## 2016-04-14 LAB — TSH: TSH: 0.34 u[IU]/mL — AB (ref 0.35–4.50)

## 2016-04-14 LAB — HEMOGLOBIN A1C: HEMOGLOBIN A1C: 6.2 % (ref 4.6–6.5)

## 2016-04-14 NOTE — Assessment & Plan Note (Signed)

## 2016-04-14 NOTE — Progress Notes (Signed)
Subjective:    Patient ID: Susan Davidson, female    DOB: Oct 27, 1950, 65 y.o.   MRN: [id number]  HPI  Here for wellness and f/u;  Overall doing ok;  Pt denies Chest pain, worsening SOB, DOE, wheezing, orthopnea, PND, worsening LE edema, palpitations, dizziness or syncope.  Pt denies neurological change such as new headache, facial or extremity weakness.  Pt denies polydipsia, polyuria, or low sugar symptoms. Pt states overall good compliance with treatment and medications, good tolerability, and has been trying to follow appropriate diet.  Pt denies worsening depressive symptoms, suicidal ideation or panic. No fever, night sweats, wt loss, loss of appetite, or other constitutional symptoms.  Pt states good ability with ADL's, has low fall risk, home safety reviewed and adequate, no other significant changes in hearing or vision, and only occasionally active with exercise. Still has pain to left clavicle at night with lying on pillows.  Declines immunizations after a previous bad reaction. Declines cologuard Past Medical History  Diagnosis Date  . Impaired glucose tolerance 08/27/2011  . HYPOTHYROIDISM 02/12/2008    Qualifier: Diagnosis of  By: Elveria Royals   . HYPERLIPIDEMIA 02/12/2008    Qualifier: Diagnosis of  By: Elveria Royals   . ANXIETY 02/12/2008    Qualifier: Diagnosis of  By: Jenny Reichmann MD, Hunt Oris   . DEPRESSION 02/12/2008    Qualifier: Diagnosis of  By: Elveria Royals   . HYPERTENSION 02/12/2008    Qualifier: Diagnosis of  By: Elveria Royals   . VITAMIN D DEFICIENCY 04/14/2010    Qualifier: Diagnosis of  By: Jenny Reichmann MD, Hunt Oris   . OSTEOPENIA 02/12/2008    Qualifier: Diagnosis of  By: Jenny Reichmann MD, Hunt Oris    No past surgical history on file.  reports that she has been smoking.  She has never used smokeless tobacco. She reports that she drinks alcohol. She reports that she does not use illicit drugs. family history includes Bipolar disorder in her sister; Heart  disease in her father. There is no history of Diabetes. Allergies  Allergen Reactions  . Other Anaphylaxis    Fine ant venom  . Epinephrine   . Influenza Virus Vaccine Split     fatigue   Current Outpatient Prescriptions on File Prior to Visit  Medication Sig Dispense Refill  . ALPRAZolam (XANAX) 0.5 MG tablet Take 0.5 mg by mouth 3 (three) times daily.     [name] Kitchen amphetamine-dextroamphetamine (ADDERALL) 20 MG tablet Take 20 mg by mouth daily. Take 2 tablets once a day    . levothyroxine (SYNTHROID, LEVOTHROID) 100 MCG tablet Take 1 tablet (100 mcg total) by mouth daily. 90 tablet 1  . levothyroxine (SYNTHROID, LEVOTHROID) 88 MCG tablet Take 1 tablet (88 mcg total) by mouth daily. 90 tablet 3  . lisinopril (PRINIVIL,ZESTRIL) 10 MG tablet Take 1 tablet (10 mg total) by mouth daily. Keep July appt for future refills 30 tablet 0  . lovastatin (MEVACOR) 20 MG tablet take 1 tablet by mouth every evening AT 6PM 90 tablet 1  . lovastatin (MEVACOR) 20 MG tablet Take 1 tablet (20 mg total) by mouth daily at 6 PM. 90 tablet 3  . PARoxetine (PAXIL) 20 MG tablet TAKE 4 TABLETS BY MOUTH EVERY MORNING 360 tablet 1   No current facility-administered medications on file prior to visit.    Review of Systems Constitutional: Negative for increased diaphoresis, or other activity, appetite or siginficant weight change other than noted HENT: Negative for worsening hearing loss,  ear pain, facial swelling, mouth sores and neck stiffness.   Eyes: Negative for other worsening pain, redness or visual disturbance.  Respiratory: Negative for choking or stridor Cardiovascular: Negative for other chest pain and palpitations.  Gastrointestinal: Negative for worsening diarrhea, blood in stool, or abdominal distention Genitourinary: Negative for hematuria, flank pain or change in urine volume.  Musculoskeletal: Negative for myalgias or other joint complaints.  Skin: Negative for other color change and wound or drainage.    Neurological: Negative for syncope and numbness. other than noted Hematological: Negative for adenopathy. or other swelling Psychiatric/Behavioral: Negative for hallucinations, SI, self-injury, decreased concentration or other worsening agitation.      Objective:   Physical Exam BP 132/80 mmHg  Pulse 86  Temp(Src) 98.3 F (36.8 C) (Oral)  Resp 20  Wt 131 lb (59.421 kg)  SpO2 98% VS noted,  Constitutional: Pt is oriented to person, place, and time. Appears well-developed and well-nourished, in no significant distress Head: Normocephalic and atraumatic  Eyes: Conjunctivae and EOM are normal. Pupils are equal, round, and reactive to light Right Ear: External ear normal.  Left Ear: External ear normal Nose: Nose normal.  Mouth/Throat: Oropharynx is clear and moist  Neck: Normal range of motion. Neck supple. No JVD present. No tracheal deviation present or significant neck LA or mass Cardiovascular: Normal rate, regular rhythm, normal heart sounds and intact distal pulses.   Pulmonary/Chest: Effort normal and breath sounds without rales or wheezing  Abdominal: Soft. Bowel sounds are normal. NT. No HSM  Musculoskeletal: Normal range of motion. Exhibits no edema Lymphadenopathy: Has no cervical adenopathy.  Neurological: Pt is alert and oriented to person, place, and time. Pt has normal reflexes. No cranial nerve deficit. Motor grossly intact Skin: Skin is warm and dry. No rash noted or new ulcers Psychiatric:  Has nervous mood and affect. Behavior is normal.     Assessment & Plan:

## 2016-04-14 NOTE — Progress Notes (Signed)
Pre visit review using our clinic review tool, if applicable. No additional management support is needed unless otherwise documented below in the visit note. 

## 2016-04-14 NOTE — Patient Instructions (Addendum)
Please continue all other medications as before, and refills have been done if requested.  Please have the pharmacy call with any other refills you may need.  Please continue your efforts at being more active, low cholesterol diet, and weight control.  You are otherwise up to date with prevention measures today.  Please keep your appointments with your specialists as you may have planned  Please stop smoking  Please go to the LAB in the Basement (turn left off the elevator) for the tests to be done today  You will be contacted by phone if any changes need to be made immediately.  Otherwise, you will receive a letter about your results with an explanation, but please check with MyChart first.  Please remember to sign up for MyChart if you have not done so, as this will be important to you in the future with finding out test results, communicating by private email, and scheduling acute appointments online when needed.  Please return in 1 year for your yearly visit, or sooner if needed, with Lab testing done 3-5 days before

## 2016-04-14 NOTE — Assessment & Plan Note (Signed)
stable overall by history and exam, recent data reviewed with pt, and pt to continue medical treatment as before,  to f/u any worsening symptoms or concerns BP Readings from Last 3 Encounters:  04/14/16 132/80  01/22/15 126/80  12/27/13 122/80

## 2016-04-14 NOTE — Assessment & Plan Note (Signed)
stable overall by history and exam, recent data reviewed with pt, and pt to continue medical treatment as before,  to f/u any worsening symptoms or concerns Lab Results  Component Value Date   HGBA1C 6.2 01/22/2015

## 2016-04-15 LAB — HEPATIC FUNCTION PANEL
ALK PHOS: 52 U/L (ref 39–117)
ALT: 15 U/L (ref 0–35)
AST: 20 U/L (ref 0–37)
Albumin: 4.3 g/dL (ref 3.5–5.2)
BILIRUBIN DIRECT: 0 mg/dL (ref 0.0–0.3)
BILIRUBIN TOTAL: 0.5 mg/dL (ref 0.2–1.2)
Total Protein: 7.3 g/dL (ref 6.0–8.3)

## 2016-04-15 LAB — BASIC METABOLIC PANEL
BUN: 22 mg/dL (ref 6–23)
CHLORIDE: 104 meq/L (ref 96–112)
CO2: 27 mEq/L (ref 19–32)
Calcium: 9.4 mg/dL (ref 8.4–10.5)
Creatinine, Ser: 0.99 mg/dL (ref 0.40–1.20)
GFR: 59.46 mL/min — AB (ref >60.00)
Glucose, Bld: 97 mg/dL (ref 70–99)
Potassium: 5.2 mEq/L — ABNORMAL HIGH (ref 3.5–5.1)
SODIUM: 140 meq/L (ref 135–145)

## 2016-04-15 LAB — LIPID PANEL
CHOL/HDL RATIO: 3
Cholesterol: 194 mg/dL (ref 0–200)
HDL: 64.7 mg/dL (ref >39.00)
LDL CALC: 114 mg/dL — AB (ref 0–99)
NonHDL: 129.33
TRIGLYCERIDES: 79 mg/dL (ref 0.0–149.0)
VLDL: 15.8 mg/dL (ref 0.0–40.0)

## 2016-04-21 ENCOUNTER — Other Ambulatory Visit: Payer: Self-pay

## 2016-04-21 MED ORDER — ALPRAZOLAM 0.5 MG PO TABS: 0.5000 mg | ORAL_TABLET | Freq: Two times a day (BID) | ORAL | Status: DC | PRN

## 2016-04-21 NOTE — Telephone Encounter (Signed)
Pt called requesting a refill of Xanax. She states she forgot to mention at recent [facility].

## 2016-04-21 NOTE — Telephone Encounter (Signed)
Done hardcopy to Corinne  

## 2016-04-22 NOTE — Telephone Encounter (Signed)
Medication refill sent to pharmacy  

## 2016-05-05 ENCOUNTER — Other Ambulatory Visit: Payer: Self-pay | Admitting: Internal Medicine

## 2016-06-30 DIAGNOSIS — F902 Attention-deficit hyperactivity disorder, combined type: Secondary | ICD-10-CM | POA: Diagnosis not present

## 2016-06-30 DIAGNOSIS — F3181 Bipolar II disorder: Secondary | ICD-10-CM | POA: Diagnosis not present

## 2016-07-05 ENCOUNTER — Other Ambulatory Visit: Payer: Self-pay | Admitting: *Deleted

## 2016-07-05 MED ORDER — LEVOTHYROXINE SODIUM 100 MCG PO TABS: 100.0000 ug | ORAL_TABLET | Freq: Every day | ORAL | 2 refills | Status: DC

## 2016-07-05 MED ORDER — LOVASTATIN 20 MG PO TABS: ORAL_TABLET | ORAL | 2 refills | Status: DC

## 2016-09-20 DIAGNOSIS — F902 Attention-deficit hyperactivity disorder, combined type: Secondary | ICD-10-CM | POA: Diagnosis not present

## 2016-09-20 DIAGNOSIS — F3181 Bipolar II disorder: Secondary | ICD-10-CM | POA: Diagnosis not present

## 2016-09-29 ENCOUNTER — Other Ambulatory Visit: Payer: Self-pay | Admitting: Internal Medicine

## 2016-10-25 NOTE — Progress Notes (Deleted)
Pre visit review using our clinic review tool, if applicable. No additional management support is needed unless otherwise documented below in the visit note. 

## 2016-10-25 NOTE — Progress Notes (Deleted)
Subjective:   Susan Davidson is a 66 y.o. female who presents for Davidson Initial Medicare Annual Wellness Visit.  Review of Systems    No ROS.  Medicare Wellness Visit.   Sleep patterns: {SX; SLEEP PATTERNS:18802::"feels rested on waking","does not get up to void","gets up *** times nightly to void","*** hours nightly"}.   Home Safety/Smoke Alarms:   Living environment; residence and Firearm Safety: {Rehab home environment / accessibility:30080::"no firearms","firearms stored safely"}. Seat Belt Safety/Bike Helmet: Wears seat belt.   Counseling:   Eye Exam-  Dental-  Female:   Pap-       Mammo-       Dexa scan-        CCS-    Objective:    There were no vitals filed for this visit. There is no height or weight on file to calculate BMI.   Current Medications (verified) Outpatient Encounter Prescriptions as of 10/26/2016  Medication Sig  . ALPRAZolam (XANAX) 0.5 MG tablet Take 1 tablet (0.5 mg total) by mouth 2 (two) times daily as needed for anxiety.  [name] Kitchen amphetamine-dextroamphetamine (ADDERALL) 20 MG tablet Take 20 mg by mouth daily. Take 2 tablets once a day  . levothyroxine (SYNTHROID, LEVOTHROID) 100 MCG tablet Take 1 tablet (100 mcg total) by mouth daily.  [name] Kitchen levothyroxine (SYNTHROID, LEVOTHROID) 100 MCG tablet take 1 tablet by mouth once daily  . lisinopril (PRINIVIL,ZESTRIL) 10 MG tablet take 1 tablet by mouth daily  . lovastatin (MEVACOR) 20 MG tablet take 1 tablet by mouth every evening AT 6PM   No facility-administered encounter medications on file as of 10/26/2016.     Allergies (verified) Other; Epinephrine; and Influenza virus vaccine split   History: Past Medical History:  Diagnosis Date  . ANXIETY 02/12/2008   Qualifier: Diagnosis of  By: Jenny Reichmann MD, Hunt Oris   . DEPRESSION 02/12/2008   Qualifier: Diagnosis of  By: Elveria Royals   . HYPERLIPIDEMIA 02/12/2008   Qualifier: Diagnosis of  By: Elveria Royals   . HYPERTENSION 02/12/2008   Qualifier:  Diagnosis of  By: Elveria Royals   . HYPOTHYROIDISM 02/12/2008   Qualifier: Diagnosis of  By: Elveria Royals   . Impaired glucose tolerance 08/27/2011  . OSTEOPENIA 02/12/2008   Qualifier: Diagnosis of  By: Jenny Reichmann MD, Hunt Oris   . VITAMIN D DEFICIENCY 04/14/2010   Qualifier: Diagnosis of  By: Jenny Reichmann MD, Hunt Oris    No past surgical history on file. Family History  Problem Relation Age of Onset  . Heart disease Father   . Bipolar disorder Sister   . Diabetes Neg Hx    Social History   Occupational History  . Not on file.   Social History Main Topics  . Smoking status: Current Every Day Smoker  . Smokeless tobacco: Never Used  . Alcohol use Yes     Comment: occasional smoker  . Drug use: No  . Sexual activity: Not on file    Tobacco Counseling Ready to quit: Not Answered Counseling given: Not Answered   Activities of Daily Living No flowsheet data found.  Immunizations and Health Maintenance Immunization History  Administered Date(s) Administered  . Tdap 10/19/2012   Health Maintenance Due  Topic Date Due  . Hepatitis C Screening  1951-08-11  . COLONOSCOPY  04/08/1999  . ZOSTAVAX  04/07/2009  . MAMMOGRAM  08/30/2012  . DEXA SCAN  04/07/2014  . PNA vac Low Risk Adult (1 of 2 - PCV13) 04/07/2014  . INFLUENZA VACCINE  05/03/2016    Patient Care Team: Biagio Borg, MD as PCP - General  Indicate any recent Medical Services you may have received from other than [facility] providers in the past year (date may be approximate).     Assessment:   This is a routine wellness examination for Susan Davidson. Physical assessment deferred to PCP.   Hearing/Vision screen No exam data present  Dietary issues and exercise activities discussed:   Diet (meal preparation, eat out, water intake, caffeinated beverages, dairy products, fruits and vegetables): {Desc; diets:16563} Breakfast: Lunch:  Dinner:      Goals    None     Depression Screen PHQ 2/9 Scores  04/14/2016 01/22/2015 12/27/2013  PHQ - 2 Score 1 0 1    Fall Risk Fall Risk  04/14/2016 01/22/2015 12/27/2013  Falls in the past year? No No Yes  Number falls in past yr: - - 1    Cognitive Function:        Screening Tests Health Maintenance  Topic Date Due  . Hepatitis C Screening  1950/10/15  . COLONOSCOPY  04/08/1999  . ZOSTAVAX  04/07/2009  . MAMMOGRAM  08/30/2012  . DEXA SCAN  04/07/2014  . PNA vac Low Risk Adult (1 of 2 - PCV13) 04/07/2014  . INFLUENZA VACCINE  05/03/2016  . TETANUS/TDAP  10/19/2022      Plan:   ***  During the course of the visit, Susan Davidson was educated and counseled about the following appropriate screening and preventive services:   Vaccines to include Pneumoccal, Influenza, Hepatitis B, Td, Zostavax, HCV  Cardiovascular disease screening  Colorectal cancer screening  Bone density screening  Diabetes screening  Glaucoma screening  Mammography/PAP  Nutrition counseling  Patient Instructions (the written plan) were given to the patient.    Susan Davidson, [place]   10/25/2016

## 2016-10-26 ENCOUNTER — Ambulatory Visit: Payer: PPO

## 2016-12-05 ENCOUNTER — Ambulatory Visit: Payer: PPO

## 2017-02-07 DIAGNOSIS — F3181 Bipolar II disorder: Secondary | ICD-10-CM | POA: Diagnosis not present

## 2017-02-07 DIAGNOSIS — F902 Attention-deficit hyperactivity disorder, combined type: Secondary | ICD-10-CM | POA: Diagnosis not present

## 2017-04-18 ENCOUNTER — Encounter: Payer: PPO | Admitting: Internal Medicine

## 2017-05-08 ENCOUNTER — Other Ambulatory Visit: Payer: Self-pay | Admitting: Internal Medicine

## 2017-05-11 ENCOUNTER — Other Ambulatory Visit (INDEPENDENT_AMBULATORY_CARE_PROVIDER_SITE_OTHER): Payer: PPO

## 2017-05-11 ENCOUNTER — Ambulatory Visit (INDEPENDENT_AMBULATORY_CARE_PROVIDER_SITE_OTHER): Payer: PPO | Admitting: Internal Medicine

## 2017-05-11 ENCOUNTER — Encounter: Payer: Self-pay | Admitting: Internal Medicine

## 2017-05-11 ENCOUNTER — Other Ambulatory Visit: Payer: Self-pay | Admitting: Internal Medicine

## 2017-05-11 VITALS — BP 130/82 | HR 81 | Ht 62.0 in | Wt 109.0 lb

## 2017-05-11 DIAGNOSIS — Z0000 Encounter for general adult medical examination without abnormal findings: Secondary | ICD-10-CM | POA: Diagnosis not present

## 2017-05-11 DIAGNOSIS — E039 Hypothyroidism, unspecified: Secondary | ICD-10-CM

## 2017-05-11 LAB — CBC WITH DIFFERENTIAL/PLATELET
BASOS PCT: 0.9 % (ref 0.0–3.0)
Basophils Absolute: 0.1 10*3/uL (ref 0.0–0.1)
EOS ABS: 0.1 10*3/uL (ref 0.0–0.7)
Eosinophils Relative: 2.3 % (ref 0.0–5.0)
HCT: 41.9 % (ref 36.0–46.0)
Hemoglobin: 13.8 g/dL (ref 12.0–15.0)
LYMPHS ABS: 2.4 10*3/uL (ref 0.7–4.0)
Lymphocytes Relative: 39.1 % (ref 12.0–46.0)
MCHC: 32.8 g/dL (ref 30.0–36.0)
MCV: 101.6 fl — ABNORMAL HIGH (ref 78.0–100.0)
MONO ABS: 0.7 10*3/uL (ref 0.1–1.0)
Monocytes Relative: 11.6 % (ref 3.0–12.0)
NEUTROS ABS: 2.8 10*3/uL (ref 1.4–7.7)
NEUTROS PCT: 46.1 % (ref 43.0–77.0)
PLATELETS: 198 10*3/uL (ref 150.0–400.0)
RBC: 4.13 Mil/uL (ref 3.87–5.11)
RDW: 13.9 % (ref 11.5–15.5)
WBC: 6 10*3/uL (ref 4.0–10.5)

## 2017-05-11 LAB — URINALYSIS, ROUTINE W REFLEX MICROSCOPIC
Bilirubin Urine: NEGATIVE
Hgb urine dipstick: NEGATIVE
Ketones, ur: NEGATIVE
Leukocytes, UA: NEGATIVE
Nitrite: NEGATIVE
RBC / HPF: NONE SEEN (ref 0–?)
SPECIFIC GRAVITY, URINE: 1.025 (ref 1.000–1.030)
TOTAL PROTEIN, URINE-UPE24: NEGATIVE
URINE GLUCOSE: NEGATIVE
Urobilinogen, UA: 0.2 (ref 0.0–1.0)
WBC UA: NONE SEEN (ref 0–?)
pH: 5.5 (ref 5.0–8.0)

## 2017-05-11 LAB — BASIC METABOLIC PANEL
BUN: 26 mg/dL — ABNORMAL HIGH (ref 6–23)
CALCIUM: 9.2 mg/dL (ref 8.4–10.5)
CHLORIDE: 104 meq/L (ref 96–112)
CO2: 32 mEq/L (ref 19–32)
CREATININE: 1.2 mg/dL (ref 0.40–1.20)
GFR: 47.47 mL/min — AB (ref >60.00)
Glucose, Bld: 97 mg/dL (ref 70–99)
Potassium: 4.6 mEq/L (ref 3.5–5.1)
Sodium: 140 mEq/L (ref 135–145)

## 2017-05-11 LAB — LIPID PANEL
CHOL/HDL RATIO: 3
Cholesterol: 177 mg/dL (ref 0–200)
HDL: 58.5 mg/dL (ref >39.00)
LDL CALC: 100 mg/dL — AB (ref 0–99)
NonHDL: 118.83
Triglycerides: 94 mg/dL (ref 0.0–149.0)
VLDL: 18.8 mg/dL (ref 0.0–40.0)

## 2017-05-11 LAB — HEPATIC FUNCTION PANEL
ALT: 16 U/L (ref 0–35)
AST: 21 U/L (ref 0–37)
Albumin: 4.4 g/dL (ref 3.5–5.2)
Alkaline Phosphatase: 58 U/L (ref 39–117)
BILIRUBIN DIRECT: 0.1 mg/dL (ref 0.0–0.3)
BILIRUBIN TOTAL: 0.5 mg/dL (ref 0.2–1.2)
Total Protein: 7 g/dL (ref 6.0–8.3)

## 2017-05-11 LAB — TSH: TSH: 0.08 u[IU]/mL — AB (ref 0.35–4.50)

## 2017-05-11 MED ORDER — LEVOTHYROXINE SODIUM 100 MCG PO TABS: 100.0000 ug | ORAL_TABLET | Freq: Every day | ORAL | 3 refills | Status: DC

## 2017-05-11 MED ORDER — LOVASTATIN 20 MG PO TABS: ORAL_TABLET | ORAL | 3 refills | Status: DC

## 2017-05-11 MED ORDER — LISINOPRIL 10 MG PO TABS: 10.0000 mg | ORAL_TABLET | Freq: Every day | ORAL | 3 refills | Status: DC

## 2017-05-11 MED ORDER — LEVOTHYROXINE SODIUM 50 MCG PO TABS: 50.0000 ug | ORAL_TABLET | Freq: Every day | ORAL | 3 refills | Status: DC

## 2017-05-11 NOTE — Patient Instructions (Signed)
Please continue all other medications as before, and refills have been done if requested.  Please have the pharmacy call with any other refills you may need.  Please continue your efforts at being more active, low cholesterol diet, and weight control.  You are otherwise up to date with prevention measures today.  Please keep your appointments with your specialists as you may have planned  Please go to the LAB in the Basement (turn left off the elevator) for the tests to be done today  You will be contacted by phone if any changes need to be made immediately.  Otherwise, you will receive a letter about your results with an explanation, but please check with MyChart first.  Please remember to sign up for MyChart if you have not done so, as this will be important to you in the future with finding out test results, communicating by private email, and scheduling acute appointments online when needed.  If you have Medicare related insurance (such as traditional Medicare, Blue Cross Medicare or United HealthCare Medicare, or similar), Please make an appointment at the Scheduling desk with Jill, the Wellness HealthCoach, for your Wellness Visit in this office, which is a benefit with your insurance.  Please return in 1 year for your yearly visit, or sooner if needed, with Lab testing done 3-5 days before  

## 2017-05-11 NOTE — Progress Notes (Signed)
Subjective:    Patient ID: Susan Davidson, female    DOB: [birth year], 66 y.o.   MRN: [id number]  HPI    Here for wellness and f/u;  Overall doing ok;  Pt denies Chest pain, worsening SOB, DOE, wheezing, orthopnea, PND, worsening LE edema, palpitations, dizziness or syncope.  Pt denies neurological change such as new headache, facial or extremity weakness.  Pt denies polydipsia, polyuria, or low sugar symptoms. Pt states overall good compliance with treatment and medications, good tolerability, and has been trying to follow appropriate diet.  Pt denies worsening depressive symptoms, suicidal ideation or panic. No fever, night sweats, loss of appetite, or other constitutional symptoms, though has wt loss recently she thinks from quite a bit of increased activity recently with taking care of a bird that one day is not there recently.  Pt states good ability with ADL's, has low fall risk, home safety reviewed and adequate, no other significant changes in hearing or vision, and not active with exercise.  S/p recent uti but now improved and seems better , assoc with urinary freq, but no fever, pain, blood so seemed to improve in last few days, now back to normal frequency  Has had some increased stress and seeing Dr Reece Levy with prn xanax.  Believes she may be more forgetful recently.   Past Medical History:  Diagnosis Date  . ANXIETY 02/12/2008   Qualifier: Diagnosis of  By: Jenny Reichmann MD, Hunt Oris   . DEPRESSION 02/12/2008   Qualifier: Diagnosis of  By: Elveria Royals   . HYPERLIPIDEMIA 02/12/2008   Qualifier: Diagnosis of  By: Elveria Royals   . HYPERTENSION 02/12/2008   Qualifier: Diagnosis of  By: Elveria Royals   . HYPOTHYROIDISM 02/12/2008   Qualifier: Diagnosis of  By: Elveria Royals   . Impaired glucose tolerance 08/27/2011  . OSTEOPENIA 02/12/2008   Qualifier: Diagnosis of  By: Jenny Reichmann MD, Hunt Oris   . VITAMIN D DEFICIENCY 04/14/2010   Qualifier: Diagnosis of  By: Jenny Reichmann MD,  Hunt Oris    No past surgical history on file.  reports that she has been smoking.  She has never used smokeless tobacco. She reports that she drinks alcohol. She reports that she does not use drugs. family history includes Bipolar disorder in her sister; Heart disease in her father. Allergies  Allergen Reactions  . Other Anaphylaxis    Fine ant venom  . Epinephrine   . Influenza Virus Vaccine Split     fatigue   Current Outpatient Prescriptions on File Prior to Visit  Medication Sig Dispense Refill  . ALPRAZolam (XANAX) 0.5 MG tablet Take 1 tablet (0.5 mg total) by mouth 2 (two) times daily as needed for anxiety. 60 tablet 1  . amphetamine-dextroamphetamine (ADDERALL) 20 MG tablet Take 20 mg by mouth daily. Take 2 tablets once a day     No current facility-administered medications on file prior to visit.    .  Review of Systems Constitutional: Negative for other unusual diaphoresis, sweats, appetite or weight changes HENT: Negative for other worsening hearing loss, ear pain, facial swelling, mouth sores or neck stiffness.   Eyes: Negative for other worsening pain, redness or other visual disturbance.  Respiratory: Negative for other stridor or swelling Cardiovascular: Negative for other palpitations or other chest pain  Gastrointestinal: Negative for worsening diarrhea or loose stools, blood in stool, distention or other pain Genitourinary: Negative for hematuria, flank pain or other change in urine volume.  Musculoskeletal: Negative for  myalgias or other joint swelling.  Skin: Negative for other color change, or other wound or worsening drainage.  Neurological: Negative for other syncope or numbness. Hematological: Negative for other adenopathy or swelling Psychiatric/Behavioral: Negative for hallucinations, other worsening agitation, SI, self-injury, or new decreased concentration All other system neg per pt    Objective:   Physical Exam BP 130/82   Pulse 81   Ht 5\' 2"  (1.575  m)   Wt 109 lb (49.4 kg)   SpO2 99%   BMI 19.94 kg/m  VS noted,  Constitutional: Pt is oriented to person, place, and time. Appears well-developed and well-nourished, in no significant distress and comfortable Head: Normocephalic and atraumatic  Eyes: Conjunctivae and EOM are normal. Pupils are equal, round, and reactive to light Right Ear: External ear normal without discharge Left Ear: External ear normal without discharge Nose: Nose without discharge or deformity Mouth/Throat: Oropharynx is without other ulcerations and moist  Neck: Normal range of motion. Neck supple. No JVD present. No tracheal deviation present or significant neck LA or mass Cardiovascular: Normal rate, regular rhythm, normal heart sounds and intact distal pulses.   Pulmonary/Chest: WOB normal and breath sounds without rales or wheezing  Abdominal: Soft. Bowel sounds are normal. NT. No HSM  Musculoskeletal: Normal range of motion. Exhibits no edema Lymphadenopathy: Has no other cervical adenopathy.  Neurological: Pt is alert and oriented to person, place, and time. Pt has normal reflexes. No cranial nerve deficit. Motor grossly intact, Gait intact Skin: Skin is warm and dry. No rash noted or new ulcerations Psychiatric:  Behavior is normal without agitation, 1-2+ nervous, depressed affect No other exam findings Lab Results  Component Value Date   WBC 7.3 04/14/2016   HGB 14.7 04/14/2016   HCT 43.7 04/14/2016   PLT 212.0 04/14/2016   GLUCOSE 97 04/14/2016   CHOL 194 04/14/2016   TRIG 79.0 04/14/2016   HDL 64.70 04/14/2016   LDLDIRECT 137.5 10/19/2012   LDLCALC 114 (H) 04/14/2016   ALT 15 04/14/2016   AST 20 04/14/2016   NA 140 04/14/2016   K 5.2 (H) 04/14/2016   CL 104 04/14/2016   CREATININE 0.99 04/14/2016   BUN 22 04/14/2016   CO2 27 04/14/2016   TSH 0.34 (L) 04/14/2016   HGBA1C 6.2 04/14/2016       Assessment & Plan:

## 2017-05-11 NOTE — Assessment & Plan Note (Signed)

## 2017-05-12 ENCOUNTER — Telehealth: Payer: Self-pay

## 2017-05-12 NOTE — Telephone Encounter (Signed)
Called pt, LVM.   

## 2017-05-12 NOTE — Telephone Encounter (Signed)
-----   Message from Biagio Borg, MD sent at 05/11/2017  6:49 PM EDT ----- Left message on MyChart, pt to cont same tx except  The test results show that your current treatment is OK, except the thyroid medication seems to be too much, as the TSH is consistently low.  We need to decrease your thyroid medication, as this may have also been contributing to your recent weight loss.  We need to change the Levothyroxine from 100 mcg per day to 50 mcg per day.  Please return to the LAB only in 4 weeks for recheck.[name] to please inform pt, I will do rx  (and pt can take HALF of her current 100 to use them up is she likes)

## 2017-05-25 DIAGNOSIS — F3181 Bipolar II disorder: Secondary | ICD-10-CM | POA: Diagnosis not present

## 2017-05-25 DIAGNOSIS — F902 Attention-deficit hyperactivity disorder, combined type: Secondary | ICD-10-CM | POA: Diagnosis not present

## 2017-09-28 DIAGNOSIS — F3181 Bipolar II disorder: Secondary | ICD-10-CM | POA: Diagnosis not present

## 2017-12-26 DIAGNOSIS — F3181 Bipolar II disorder: Secondary | ICD-10-CM | POA: Diagnosis not present

## 2017-12-26 DIAGNOSIS — F902 Attention-deficit hyperactivity disorder, combined type: Secondary | ICD-10-CM | POA: Diagnosis not present

## 2018-03-28 DIAGNOSIS — F3181 Bipolar II disorder: Secondary | ICD-10-CM | POA: Diagnosis not present

## 2018-06-05 ENCOUNTER — Other Ambulatory Visit: Payer: Self-pay | Admitting: Internal Medicine

## 2018-06-30 ENCOUNTER — Other Ambulatory Visit: Payer: Self-pay | Admitting: Internal Medicine

## 2018-08-02 ENCOUNTER — Other Ambulatory Visit: Payer: Self-pay | Admitting: Internal Medicine

## 2018-08-03 MED ORDER — LEVOTHYROXINE SODIUM 50 MCG PO TABS: 50.0000 ug | ORAL_TABLET | Freq: Every day | ORAL | 0 refills | Status: DC

## 2018-08-15 ENCOUNTER — Other Ambulatory Visit: Payer: Self-pay | Admitting: Internal Medicine

## 2018-09-11 DIAGNOSIS — F3181 Bipolar II disorder: Secondary | ICD-10-CM | POA: Diagnosis not present

## 2018-09-11 DIAGNOSIS — F902 Attention-deficit hyperactivity disorder, combined type: Secondary | ICD-10-CM | POA: Diagnosis not present

## 2018-10-01 ENCOUNTER — Other Ambulatory Visit: Payer: Self-pay | Admitting: Internal Medicine

## 2018-10-08 ENCOUNTER — Encounter: Payer: PPO | Admitting: Internal Medicine

## 2018-11-02 ENCOUNTER — Other Ambulatory Visit: Payer: Self-pay | Admitting: Internal Medicine

## 2018-11-02 ENCOUNTER — Encounter: Payer: Self-pay | Admitting: Internal Medicine

## 2018-11-02 ENCOUNTER — Other Ambulatory Visit (INDEPENDENT_AMBULATORY_CARE_PROVIDER_SITE_OTHER): Payer: PPO

## 2018-11-02 ENCOUNTER — Ambulatory Visit (INDEPENDENT_AMBULATORY_CARE_PROVIDER_SITE_OTHER): Payer: PPO | Admitting: Internal Medicine

## 2018-11-02 VITALS — BP 142/90 | HR 76 | Temp 98.3°F | Ht 62.0 in | Wt 124.0 lb

## 2018-11-02 DIAGNOSIS — R350 Frequency of micturition: Secondary | ICD-10-CM

## 2018-11-02 DIAGNOSIS — R7302 Impaired glucose tolerance (oral): Secondary | ICD-10-CM

## 2018-11-02 DIAGNOSIS — I10 Essential (primary) hypertension: Secondary | ICD-10-CM | POA: Diagnosis not present

## 2018-11-02 DIAGNOSIS — Z1159 Encounter for screening for other viral diseases: Secondary | ICD-10-CM | POA: Diagnosis not present

## 2018-11-02 DIAGNOSIS — E785 Hyperlipidemia, unspecified: Secondary | ICD-10-CM | POA: Diagnosis not present

## 2018-11-02 DIAGNOSIS — Z0000 Encounter for general adult medical examination without abnormal findings: Secondary | ICD-10-CM | POA: Diagnosis not present

## 2018-11-02 LAB — URINALYSIS, ROUTINE W REFLEX MICROSCOPIC
Bilirubin Urine: NEGATIVE
Hgb urine dipstick: NEGATIVE
Ketones, ur: NEGATIVE
Nitrite: NEGATIVE
Specific Gravity, Urine: 1.025 (ref 1.000–1.030)
Total Protein, Urine: NEGATIVE
Urine Glucose: NEGATIVE
Urobilinogen, UA: 0.2 (ref 0.0–1.0)
pH: 5.5 (ref 5.0–8.0)

## 2018-11-02 LAB — HEPATIC FUNCTION PANEL
ALK PHOS: 60 U/L (ref 39–117)
ALT: 14 U/L (ref 0–35)
AST: 19 U/L (ref 0–37)
Albumin: 4.7 g/dL (ref 3.5–5.2)
Bilirubin, Direct: 0.1 mg/dL (ref 0.0–0.3)
Total Bilirubin: 0.5 mg/dL (ref 0.2–1.2)
Total Protein: 7.6 g/dL (ref 6.0–8.3)

## 2018-11-02 LAB — CBC WITH DIFFERENTIAL/PLATELET
Basophils Absolute: 0.1 10*3/uL (ref 0.0–0.1)
Basophils Relative: 0.9 % (ref 0.0–3.0)
Eosinophils Absolute: 0.1 10*3/uL (ref 0.0–0.7)
Eosinophils Relative: 1.2 % (ref 0.0–5.0)
HCT: 47.2 % — ABNORMAL HIGH (ref 36.0–46.0)
Hemoglobin: 15.8 g/dL — ABNORMAL HIGH (ref 12.0–15.0)
Lymphocytes Relative: 20.9 % (ref 12.0–46.0)
Lymphs Abs: 1.8 10*3/uL (ref 0.7–4.0)
MCHC: 33.4 g/dL (ref 30.0–36.0)
MCV: 102 fl — ABNORMAL HIGH (ref 78.0–100.0)
MONOS PCT: 8.6 % (ref 3.0–12.0)
Monocytes Absolute: 0.8 10*3/uL (ref 0.1–1.0)
Neutro Abs: 6 10*3/uL (ref 1.4–7.7)
Neutrophils Relative %: 68.4 % (ref 43.0–77.0)
Platelets: 188 10*3/uL (ref 150.0–400.0)
RBC: 4.63 Mil/uL (ref 3.87–5.11)
RDW: 14.6 % (ref 11.5–15.5)
WBC: 8.8 10*3/uL (ref 4.0–10.5)

## 2018-11-02 LAB — BASIC METABOLIC PANEL
BUN: 21 mg/dL (ref 6–23)
CHLORIDE: 99 meq/L (ref 96–112)
CO2: 30 mEq/L (ref 19–32)
Calcium: 9.5 mg/dL (ref 8.4–10.5)
Creatinine, Ser: 1.16 mg/dL (ref 0.40–1.20)
GFR: 46.24 mL/min — ABNORMAL LOW (ref >60.00)
Glucose, Bld: 100 mg/dL — ABNORMAL HIGH (ref 70–99)
Potassium: 4.3 mEq/L (ref 3.5–5.1)
Sodium: 137 mEq/L (ref 135–145)

## 2018-11-02 LAB — LIPID PANEL
Cholesterol: 235 mg/dL — ABNORMAL HIGH (ref 0–200)
HDL: 72.4 mg/dL
LDL Cholesterol: 126 mg/dL — ABNORMAL HIGH (ref 0–99)
NonHDL: 162.86
Total CHOL/HDL Ratio: 3
Triglycerides: 185 mg/dL — ABNORMAL HIGH (ref 0.0–149.0)
VLDL: 37 mg/dL (ref 0.0–40.0)

## 2018-11-02 LAB — TSH: TSH: 30.78 u[IU]/mL — AB (ref 0.35–4.50)

## 2018-11-02 LAB — HEMOGLOBIN A1C: Hgb A1c MFr Bld: 6.3 % (ref 4.6–6.5)

## 2018-11-02 MED ORDER — CEPHALEXIN 500 MG PO CAPS: 500.0000 mg | ORAL_CAPSULE | Freq: Three times a day (TID) | ORAL | 0 refills | Status: AC

## 2018-11-02 MED ORDER — LISINOPRIL 10 MG PO TABS: 10.0000 mg | ORAL_TABLET | Freq: Every day | ORAL | 3 refills | Status: DC

## 2018-11-02 MED ORDER — LOVASTATIN 20 MG PO TABS: ORAL_TABLET | ORAL | 3 refills | Status: DC

## 2018-11-02 MED ORDER — LEVOTHYROXINE SODIUM 50 MCG PO TABS: ORAL_TABLET | ORAL | 3 refills | Status: DC

## 2018-11-02 MED ORDER — PAROXETINE HCL 20 MG PO TABS: 20.0000 mg | ORAL_TABLET | Freq: Every day | ORAL | 3 refills | Status: DC

## 2018-11-02 NOTE — Assessment & Plan Note (Signed)

## 2018-11-02 NOTE — Assessment & Plan Note (Signed)
stable overall by history and exam, recent data reviewed with pt, and pt to continue medical treatment as before,  to f/u any worsening symptoms or concerns  

## 2018-11-02 NOTE — Assessment & Plan Note (Addendum)
Unusual for her, afeb without dysuria, but o/w suggestive of possible UTI - for urine studies  In addition to the time spent performing CPE, I spent an additional 15 minutes face to face,in which greater than 50% of this time was spent in counseling and coordination of care for patient's illness as documented, including the differential dx, treatment, further evaluation and other management of urinary frequency, HTN, hyperglycemia, HLD

## 2018-11-02 NOTE — Progress Notes (Signed)
Subjective:    Patient ID: YENG FRANKIE, female    DOB: April 21, 1951, 68 y.o.   MRN: [id number]  HPI  Here for wellness and f/u;  Overall doing ok;  Pt denies Chest pain, worsening SOB, DOE, wheezing, orthopnea, PND, worsening LE edema, palpitations, dizziness or syncope.  Pt denies neurological change such as new headache, facial or extremity weakness.  Pt denies polydipsia, polyuria, or low sugar symptoms. Pt states overall good compliance with treatment and medications, good tolerability, and has been trying to follow appropriate diet.  Pt denies worsening depressive symptoms, suicidal ideation or panic. No fever, night sweats, wt loss, loss of appetite, or other constitutional symptoms.  Pt states good ability with ADL's, has low fall risk, home safety reviewed and adequate, no other significant changes in hearing or vision, and only occasionally active with exercise.   Plans to see GYN for mammogram, exam and DXA Wt Readings from Last 3 Encounters:  11/02/18 124 lb (56.2 kg)  05/11/17 109 lb (49.4 kg)  04/14/16 131 lb (59.4 kg)   Gained more or less intentionallly as was too thin, with better diet  Now on paxil as well as adderall 20 bid and xanax prn per psychiatry. Believes she had a nose ring on the left embedded, but thinks she can deal with this on her own before seeing ENT.    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  Has difficulty sleeping. Conts to see psychiatry regularly.  Also Has some urinary urgency and frequency x 2 wks, but Denies urinary symptoms such as dysuria,  flank pain, hematuria or n/v, fever, chills. Past Medical History:  Diagnosis Date  . ANXIETY 02/12/2008   Qualifier: Diagnosis of  By: Jenny Reichmann MD, Hunt Oris   . DEPRESSION 02/12/2008   Qualifier: Diagnosis of  By: Elveria Royals   . HYPERLIPIDEMIA 02/12/2008   Qualifier: Diagnosis of   By: Elveria Royals   . HYPERTENSION 02/12/2008   Qualifier: Diagnosis of  By: Elveria Royals   . HYPOTHYROIDISM 02/12/2008   Qualifier: Diagnosis of  By: Elveria Royals   . Impaired glucose tolerance 08/27/2011  . OSTEOPENIA 02/12/2008   Qualifier: Diagnosis of  By: Jenny Reichmann MD, Hunt Oris   . VITAMIN D DEFICIENCY 04/14/2010   Qualifier: Diagnosis of  By: Jenny Reichmann MD, Hunt Oris    No past surgical history on file.  reports that she has been smoking. She has never used smokeless tobacco. She reports current alcohol use. She reports that she does not use drugs. family history includes Bipolar disorder in her sister; Heart disease in her father. Allergies  Allergen Reactions  . Other Anaphylaxis    Fire ant venom  . Epinephrine   . Influenza Vac Split Quad     fatigue   Current Outpatient Medications on File Prior to Visit  Medication Sig Dispense Refill  . ALPRAZolam (XANAX) 0.5 MG tablet Take 1 tablet (0.5 mg total) by mouth 2 (two) times daily as needed for anxiety. 60 tablet 1  . amphetamine-dextroamphetamine (ADDERALL) 20 MG tablet Take 20 mg by mouth daily. Take 2 tablets once a day     No current facility-administered medications on file prior to visit.    Review of Systems Constitutional: Negative for other unusual diaphoresis, sweats, appetite or weight changes HENT: Negative for other worsening hearing loss,  ear pain, facial swelling, mouth sores or neck stiffness.   Eyes: Negative for other worsening pain, redness or other visual disturbance.  Respiratory: Negative for other stridor or swelling Cardiovascular: Negative for other palpitations or other chest pain  Gastrointestinal: Negative for worsening diarrhea or loose stools, blood in stool, distention or other pain Genitourinary: Negative for hematuria, flank pain or other change in urine volume.  Musculoskeletal: Negative for myalgias or other joint swelling.  Skin: Negative for other color change, or other  wound or worsening drainage.  Neurological: Negative for other syncope or numbness. Hematological: Negative for other adenopathy or swelling Psychiatric/Behavioral: Negative for hallucinations, other worsening agitation, SI, self-injury, or new decreased concentration All other system neg per pt    Objective:   Physical Exam BP (!) 142/90   Pulse 76   Temp 98.3 F (36.8 C) (Oral)   Ht 5\' 2"  (1.575 m)   Wt 124 lb (56.2 kg)   SpO2 96%   BMI 22.68 kg/m  VS noted,  Constitutional: Pt is oriented to person, place, and time. Appears well-developed and well-nourished, in no significant distress and comfortable Head: Normocephalic and atraumatic  Eyes: Conjunctivae and EOM are normal. Pupils are equal, round, and reactive to light Right Ear: External ear normal without discharge Left Ear: External ear normal without discharge Nose: Nose without discharge or deformity Mouth/Throat: Oropharynx is without other ulcerations and moist  Neck: Normal range of motion. Neck supple. No JVD present. No tracheal deviation present or significant neck LA or mass Cardiovascular: Normal rate, regular rhythm, normal heart sounds and intact distal pulses.   Pulmonary/Chest: WOB normal and breath sounds without rales or wheezing  Abdominal: Soft. Bowel sounds are normal. NT. No HSM  Musculoskeletal: Normal range of motion. Exhibits no edema Lymphadenopathy: Has no other cervical adenopathy.  Neurological: Pt is alert and oriented to person, place, and time. Pt has normal reflexes. No cranial nerve deficit. Motor grossly intact, Gait intact Skin: Skin is warm and dry. No rash noted or new ulcerations Psychiatric:  Has normal mood and affect. Behavior is normal without agitation No other exam findings Lab Results  Component Value Date   WBC 8.8 11/02/2018   HGB 15.8 (H) 11/02/2018   HCT 47.2 (H) 11/02/2018   PLT 188.0 11/02/2018   GLUCOSE 100 (H) 11/02/2018   CHOL 235 (H) 11/02/2018   TRIG 185.0 (H)  11/02/2018   HDL 72.40 11/02/2018   LDLDIRECT 137.5 10/19/2012   LDLCALC 126 (H) 11/02/2018   ALT 14 11/02/2018   AST 19 11/02/2018   NA 137 11/02/2018   K 4.3 11/02/2018   CL 99 11/02/2018   CREATININE 1.16 11/02/2018   BUN 21 11/02/2018   CO2 30 11/02/2018   TSH 30.78 (H) 11/02/2018   HGBA1C 6.3 11/02/2018      Assessment & Plan:

## 2018-11-02 NOTE — Patient Instructions (Signed)

## 2018-11-03 LAB — URINE CULTURE
MICRO NUMBER:: 134365
SPECIMEN QUALITY:: ADEQUATE

## 2018-11-03 LAB — HEPATITIS C ANTIBODY
Hepatitis C Ab: NONREACTIVE
SIGNAL TO CUT-OFF: 0.04 (ref <1.00)

## 2018-11-05 ENCOUNTER — Telehealth: Payer: Self-pay

## 2018-11-05 NOTE — Telephone Encounter (Signed)
Called pt, LVM.   CRM created.  

## 2018-11-05 NOTE — Telephone Encounter (Signed)
-----   Message from Biagio Borg, MD sent at 11/02/2018  6:24 PM EST ----- Left message on MyChart, pt to cont same tx except  The test results show that your current treatment is OK.  Except the urine testing is consistent with possible infection.  The urine culture is pending, but we can start the antibiotic.  I will send and you should hear from the office as well  Shirron to please inform pt, I will do rx

## 2018-12-25 DIAGNOSIS — F3181 Bipolar II disorder: Secondary | ICD-10-CM | POA: Diagnosis not present

## 2018-12-25 DIAGNOSIS — F902 Attention-deficit hyperactivity disorder, combined type: Secondary | ICD-10-CM | POA: Diagnosis not present

## 2019-03-29 ENCOUNTER — Telehealth: Payer: Self-pay | Admitting: Internal Medicine

## 2019-03-29 MED ORDER — ALPRAZOLAM 0.5 MG PO TABS: 0.5000 mg | ORAL_TABLET | Freq: Two times a day (BID) | ORAL | 1 refills | Status: DC | PRN

## 2019-03-29 NOTE — Telephone Encounter (Signed)
Medication Refill - Medication: ALPRAZolam (XANAX) 0.5 MG tablet    Has the patient contacted their pharmacy? Yes.   (Agent: If yes, when and what did the pharmacy advise?) Pharmacy coordinator stated he could not assist with this request.   Preferred Pharmacy (with phone number or street name):  [facility] [place], [place] - [address] [phone] (Phone) [phone] (Fax)     Agent: Please be advised that RX refills may take up to 3 business days. We ask that you follow-up with your pharmacy.

## 2019-03-29 NOTE — Addendum Note (Signed)
Addended by: Biagio Borg on: 03/29/2019 05:24 PM   Modules accepted: Orders

## 2019-03-29 NOTE — Telephone Encounter (Signed)
Done erx 

## 2019-04-24 DIAGNOSIS — F902 Attention-deficit hyperactivity disorder, combined type: Secondary | ICD-10-CM | POA: Diagnosis not present

## 2019-05-06 ENCOUNTER — Ambulatory Visit: Payer: PPO | Admitting: Internal Medicine

## 2019-05-06 DIAGNOSIS — Z0289 Encounter for other administrative examinations: Secondary | ICD-10-CM

## 2019-07-24 DIAGNOSIS — F902 Attention-deficit hyperactivity disorder, combined type: Secondary | ICD-10-CM | POA: Diagnosis not present

## 2019-07-24 DIAGNOSIS — F3181 Bipolar II disorder: Secondary | ICD-10-CM | POA: Diagnosis not present

## 2019-10-22 DIAGNOSIS — F3181 Bipolar II disorder: Secondary | ICD-10-CM | POA: Diagnosis not present

## 2019-11-21 ENCOUNTER — Other Ambulatory Visit: Payer: Self-pay | Admitting: Internal Medicine

## 2019-11-21 NOTE — Telephone Encounter (Signed)
Pt is overdue for an appt. No appt sched. Once scheduled refills can be sent.

## 2019-11-21 NOTE — Telephone Encounter (Signed)
Please refill as per office routine med refill policy (all routine meds refilled for 3 mo or monthly per pt preference up to one year from last visit, then month to month grace period for 3 mo, then further med refills will have to be denied)  

## 2019-11-23 ENCOUNTER — Other Ambulatory Visit: Payer: Self-pay | Admitting: Internal Medicine

## 2019-11-24 NOTE — Telephone Encounter (Signed)
Please refill as per office routine med refill policy (all routine meds refilled for 3 mo or monthly per pt preference up to one year from last visit, then month to month grace period for 3 mo, then further med refills will have to be denied)  

## 2019-12-17 ENCOUNTER — Other Ambulatory Visit: Payer: Self-pay | Admitting: Internal Medicine

## 2019-12-31 ENCOUNTER — Ambulatory Visit: Payer: PPO | Admitting: Internal Medicine

## 2020-01-14 ENCOUNTER — Other Ambulatory Visit: Payer: Self-pay | Admitting: Internal Medicine

## 2020-02-10 ENCOUNTER — Other Ambulatory Visit: Payer: Self-pay | Admitting: Internal Medicine

## 2020-02-10 NOTE — Telephone Encounter (Signed)
Please refill as per office routine med refill policy (all routine meds refilled for 3 mo or monthly per pt preference up to one year from last visit, then month to month grace period for 3 mo, then further med refills will have to be denied)  

## 2020-02-25 ENCOUNTER — Other Ambulatory Visit: Payer: Self-pay | Admitting: Internal Medicine

## 2020-02-25 NOTE — Telephone Encounter (Signed)
New message:   1.Medication Requested: lisinopril (ZESTRIL) 10 MG tablet lovastatin (MEVACOR) 20 MG tablet ALPRAZolam (XANAX) 0.5 MG tablet 2. Pharmacy (Name, [facility]): [facility] [place] [address] 3. On Med List: Yes  4. Last Visit with PCP:   5. Next visit date with PCP:   Agent: Please be advised that RX refills may take up to 3 business days. We ask that you follow-up with your pharmacy.

## 2020-02-26 ENCOUNTER — Telehealth: Payer: Self-pay

## 2020-02-26 NOTE — Telephone Encounter (Signed)
Tried calling Susan Davidson and vm box was full. I also sent Susan Davidson a MyChart message informing her that in order to be given any med refills she has to come in for a physical with Dr. Jenny Reichmann as she is over due. Was last seen in office 10/2018.

## 2020-02-27 ENCOUNTER — Telehealth: Payer: Self-pay | Admitting: Internal Medicine

## 2020-02-27 NOTE — Telephone Encounter (Signed)
New message:    1.Medication Requested: lisinopril (ZESTRIL) 10 MG tablet lovastatin (MEVACOR) 20 MG tablet levothyroxine (SYNTHROID) 50 MCG tablet 2. Pharmacy (Name, [facility]):  [facility] [place] [address] [place] 3. On Med List: Yes  4. Last Visit with PCP: 11/02/18  5. Next visit date with PCP: No   Agent: Please be advised that RX refills may take up to 3 business days. We ask that you follow-up with your pharmacy.

## 2020-02-27 NOTE — Telephone Encounter (Signed)
Tried calling pt again.  Pt is past due for her yearly exam along with past the 3 month period after the 1 year guideline to refill meds. LVM informing pt of this and to please call the office to schedule an apptmnt for a physical and med refills.

## 2020-03-11 ENCOUNTER — Other Ambulatory Visit: Payer: Self-pay

## 2020-03-11 ENCOUNTER — Ambulatory Visit (INDEPENDENT_AMBULATORY_CARE_PROVIDER_SITE_OTHER): Payer: PPO | Admitting: Internal Medicine

## 2020-03-11 ENCOUNTER — Other Ambulatory Visit (INDEPENDENT_AMBULATORY_CARE_PROVIDER_SITE_OTHER): Payer: PPO

## 2020-03-11 ENCOUNTER — Encounter: Payer: Self-pay | Admitting: Internal Medicine

## 2020-03-11 VITALS — BP 130/80 | HR 67 | Temp 98.9°F | Ht 62.0 in

## 2020-03-11 DIAGNOSIS — E538 Deficiency of other specified B group vitamins: Secondary | ICD-10-CM

## 2020-03-11 DIAGNOSIS — Z0000 Encounter for general adult medical examination without abnormal findings: Secondary | ICD-10-CM

## 2020-03-11 DIAGNOSIS — E559 Vitamin D deficiency, unspecified: Secondary | ICD-10-CM

## 2020-03-11 DIAGNOSIS — R7302 Impaired glucose tolerance (oral): Secondary | ICD-10-CM | POA: Diagnosis not present

## 2020-03-11 DIAGNOSIS — F329 Major depressive disorder, single episode, unspecified: Secondary | ICD-10-CM

## 2020-03-11 DIAGNOSIS — E039 Hypothyroidism, unspecified: Secondary | ICD-10-CM | POA: Diagnosis not present

## 2020-03-11 DIAGNOSIS — F32A Depression, unspecified: Secondary | ICD-10-CM

## 2020-03-11 LAB — CBC WITH DIFFERENTIAL/PLATELET
Basophils Absolute: 0 10*3/uL (ref 0.0–0.1)
Basophils Relative: 0.6 % (ref 0.0–3.0)
Eosinophils Absolute: 0 10*3/uL (ref 0.0–0.7)
Eosinophils Relative: 0.2 % (ref 0.0–5.0)
HCT: 47.2 % — ABNORMAL HIGH (ref 36.0–46.0)
Hemoglobin: 15.6 g/dL — ABNORMAL HIGH (ref 12.0–15.0)
Lymphocytes Relative: 15 % (ref 12.0–46.0)
Lymphs Abs: 0.9 10*3/uL (ref 0.7–4.0)
MCHC: 33 g/dL (ref 30.0–36.0)
MCV: 102.1 fl — ABNORMAL HIGH (ref 78.0–100.0)
Monocytes Absolute: 0.4 10*3/uL (ref 0.1–1.0)
Monocytes Relative: 6.3 % (ref 3.0–12.0)
Neutro Abs: 4.9 10*3/uL (ref 1.4–7.7)
Neutrophils Relative %: 77.9 % — ABNORMAL HIGH (ref 43.0–77.0)
Platelets: 235 10*3/uL (ref 150.0–400.0)
RBC: 4.62 Mil/uL (ref 3.87–5.11)
RDW: 14.5 % (ref 11.5–15.5)
WBC: 6.2 10*3/uL (ref 4.0–10.5)

## 2020-03-11 MED ORDER — LEVOTHYROXINE SODIUM 50 MCG PO TABS: ORAL_TABLET | ORAL | 3 refills | Status: DC

## 2020-03-11 MED ORDER — LISINOPRIL 10 MG PO TABS: 10.0000 mg | ORAL_TABLET | Freq: Every day | ORAL | 3 refills | Status: DC

## 2020-03-11 MED ORDER — LOVASTATIN 20 MG PO TABS: ORAL_TABLET | ORAL | 3 refills | Status: DC

## 2020-03-11 NOTE — Patient Instructions (Signed)
Please continue all other medications as before, and refills have been done if requested.  Please have the pharmacy call with any other refills you may need.  Please continue your efforts at being more active, low cholesterol diet, and weight control.  You are otherwise up to date with prevention measures today.  Please keep your appointments with your specialists as you may have planned  Please go to the LAB at the blood drawing area for the tests to be done- at the [place] will be contacted by phone if any changes need to be made immediately.  Otherwise, you will receive a letter about your results with an explanation, but please check with MyChart first.  Please remember to sign up for MyChart if you have not done so, as this will be important to you in the future with finding out test results, communicating by private email, and scheduling acute appointments online when needed.  Please make an Appointment to return in 6 months, or sooner if needed

## 2020-03-11 NOTE — Progress Notes (Signed)
Subjective:    Patient ID: Susan Davidson, female    DOB: 15-Nov-1950, 69 y.o.   MRN: [id number]  HPI  Here for wellness and f/u;  Overall doing ok;  Pt denies Chest pain, worsening SOB, DOE, wheezing, orthopnea, PND, worsening LE edema, palpitations, dizziness or syncope.  Pt denies neurological change such as new headache, facial or extremity weakness.  Pt denies polydipsia, polyuria, or low sugar symptoms. Pt states overall good compliance with treatment and medications, good tolerability, and has been trying to follow appropriate diet.  Pt denies worsening depressive symptoms, suicidal ideation or panic. No fever, night sweats, wt loss, loss of appetite, or other constitutional symptoms.  Pt states good ability with ADL's, has low fall risk, home safety reviewed and adequate, no other significant changes in  vision, and only occasionally active with exercise.  Denies hyper or hypo thyroid symptoms such as voice, skin or hair change.  Admits to being out of all meds for more than 2-3 wks.  conts to follow with Dr reddy psychiatry.  Has worsening left hearing loss, needs ent referral Past Medical History:  Diagnosis Date   ANXIETY 02/12/2008   Qualifier: Diagnosis of  By: Jenny Reichmann MD, Hunt Oris    DEPRESSION 02/12/2008   Qualifier: Diagnosis of  By: Elveria Royals    HYPERLIPIDEMIA 02/12/2008   Qualifier: Diagnosis of  By: Elveria Royals    HYPERTENSION 02/12/2008   Qualifier: Diagnosis of  By: [place], [place] 02/12/2008   Qualifier: Diagnosis of  By: Elveria Royals    Impaired glucose tolerance 08/27/2011   OSTEOPENIA 02/12/2008   Qualifier: Diagnosis of  By: Jenny Reichmann MD, Hunt Oris    VITAMIN D DEFICIENCY 04/14/2010   Qualifier: Diagnosis of  By: Jenny Reichmann MD, Hunt Oris    History reviewed. No pertinent surgical history.  reports that she has been smoking. She has never used smokeless tobacco. She reports current alcohol use. She reports that she does  not use drugs. family history includes Bipolar disorder in her sister; Heart disease in her father. Allergies  Allergen Reactions   Other Anaphylaxis    Fire ant venom   Epinephrine    Influenza Vac Split Quad     fatigue   Current Outpatient Medications on File Prior to Visit  Medication Sig Dispense Refill   ALPRAZolam (XANAX) 0.5 MG tablet Take 1 tablet (0.5 mg total) by mouth 2 (two) times daily as needed for anxiety. 60 tablet 1   amphetamine-dextroamphetamine (ADDERALL) 20 MG tablet Take 20 mg by mouth daily. Take 2 tablets once a day     PARoxetine (PAXIL) 20 MG tablet Take 1 tablet (20 mg total) by mouth daily. 90 tablet 3   No current facility-administered medications on file prior to visit.   Review of Systems All otherwise neg per pt    Objective:   Physical Exam BP 130/80 (BP Location: Left Arm, Patient Position: Sitting, Cuff Size: Large)    Pulse 67    Temp 98.9 F (37.2 C) (Oral)    Ht 5\' 2"  (1.575 m)    SpO2 99%    BMI 22.68 kg/m  VS noted,  Constitutional: Pt appears in NAD HENT: Head: NCAT.  Right Ear: External ear normal.  Left Ear: External ear normal.  Eyes: . Pupils are equal, round, and reactive to light. Conjunctivae and EOM are normal Nose: without d/c or deformity Neck: Neck supple. Gross normal ROM Cardiovascular: Normal rate and regular rhythm.  Pulmonary/Chest: Effort normal and breath sounds without rales or wheezing.  Abd:  Soft, NT, ND, + BS, no organomegaly Neurological: Pt is alert. At baseline orientation, motor grossly intact Skin: Skin is warm. No rashes, other new lesions, no LE edema Psychiatric: Pt behavior is normal without agitation  All otherwise neg per pt Lab Results  Component Value Date   WBC 6.2 03/11/2020   HGB 15.6 (H) 03/11/2020   HCT 47.2 (H) 03/11/2020   PLT 235.0 03/11/2020   GLUCOSE 120 (H) 03/11/2020   CHOL 465 (H) 03/11/2020   TRIG 173.0 (H) 03/11/2020   HDL 70.20 03/11/2020   LDLDIRECT 137.5 10/19/2012    LDLCALC 360 (H) 03/11/2020   ALT 12 03/11/2020   AST 22 03/11/2020   NA 137 03/11/2020   K 4.0 03/11/2020   CL 96 03/11/2020   CREATININE 1.26 (H) 03/11/2020   BUN 16 03/11/2020   CO2 30 03/11/2020   TSH 34.78 (H) 03/11/2020   HGBA1C 6.3 11/02/2018       Assessment & Plan:

## 2020-03-12 LAB — URINALYSIS, ROUTINE W REFLEX MICROSCOPIC
Bilirubin Urine: NEGATIVE
Hgb urine dipstick: NEGATIVE
Leukocytes,Ua: NEGATIVE
Nitrite: NEGATIVE
Specific Gravity, Urine: >=1.03 — AB (ref 1.000–1.030)
Urine Glucose: NEGATIVE
Urobilinogen, UA: 0.2 (ref 0.0–1.0)
pH: 6 (ref 5.0–8.0)

## 2020-03-12 LAB — HEPATIC FUNCTION PANEL
ALT: 12 U/L (ref 0–35)
AST: 22 U/L (ref 0–37)
Albumin: 4.7 g/dL (ref 3.5–5.2)
Alkaline Phosphatase: 59 U/L (ref 39–117)
Bilirubin, Direct: 0 mg/dL (ref 0.0–0.3)
Total Bilirubin: 0.6 mg/dL (ref 0.2–1.2)
Total Protein: 7.8 g/dL (ref 6.0–8.3)

## 2020-03-12 LAB — VITAMIN B12: Vitamin B-12: 209 pg/mL — ABNORMAL LOW (ref 211–911)

## 2020-03-12 LAB — BASIC METABOLIC PANEL
BUN: 16 mg/dL (ref 6–23)
CO2: 30 mEq/L (ref 19–32)
Calcium: 9.6 mg/dL (ref 8.4–10.5)
Chloride: 96 mEq/L (ref 96–112)
Creatinine, Ser: 1.26 mg/dL — ABNORMAL HIGH (ref 0.40–1.20)
GFR: 41.87 mL/min — ABNORMAL LOW (ref >60.00)
Glucose, Bld: 120 mg/dL — ABNORMAL HIGH (ref 70–99)
Potassium: 4 mEq/L (ref 3.5–5.1)
Sodium: 137 mEq/L (ref 135–145)

## 2020-03-12 LAB — LIPID PANEL
Cholesterol: 465 mg/dL — ABNORMAL HIGH (ref 0–200)
HDL: 70.2 mg/dL (ref >39.00)
LDL Cholesterol: 360 mg/dL — ABNORMAL HIGH (ref 0–99)
NonHDL: 394.62
Total CHOL/HDL Ratio: 7
Triglycerides: 173 mg/dL — ABNORMAL HIGH (ref 0.0–149.0)
VLDL: 34.6 mg/dL (ref 0.0–40.0)

## 2020-03-12 LAB — TSH: TSH: 34.78 u[IU]/mL — ABNORMAL HIGH (ref 0.35–4.50)

## 2020-03-12 LAB — VITAMIN D 25 HYDROXY (VIT D DEFICIENCY, FRACTURES): VITD: 13.99 ng/mL — ABNORMAL LOW (ref 30.00–100.00)

## 2020-03-14 ENCOUNTER — Other Ambulatory Visit: Payer: Self-pay | Admitting: Internal Medicine

## 2020-03-14 MED ORDER — VITAMIN D (ERGOCALCIFEROL) 1.25 MG (50000 UNIT) PO CAPS: 50000.0000 [IU] | ORAL_CAPSULE | ORAL | 0 refills | Status: DC

## 2020-03-14 MED ORDER — VITAMIN B-12 1000 MCG PO TABS
1000.0000 ug | ORAL_TABLET | Freq: Every day | ORAL | 3 refills | Status: DC
Start: 2020-03-14 — End: 2022-01-25

## 2020-03-15 ENCOUNTER — Encounter: Payer: Self-pay | Admitting: Internal Medicine

## 2020-03-15 NOTE — Assessment & Plan Note (Signed)
stable overall by history and exam, recent data reviewed with pt, and pt to continue medical treatment as before,  to f/u any worsening symptoms or concerns  

## 2020-03-15 NOTE — Assessment & Plan Note (Signed)
/  fu dr reddy

## 2020-03-15 NOTE — Assessment & Plan Note (Signed)

## 2020-03-15 NOTE — Assessment & Plan Note (Signed)
For med restart

## 2020-03-25 DIAGNOSIS — Z79891 Long term (current) use of opiate analgesic: Secondary | ICD-10-CM | POA: Diagnosis not present

## 2020-03-25 DIAGNOSIS — F902 Attention-deficit hyperactivity disorder, combined type: Secondary | ICD-10-CM | POA: Diagnosis not present

## 2020-03-25 DIAGNOSIS — F3181 Bipolar II disorder: Secondary | ICD-10-CM | POA: Diagnosis not present

## 2020-04-22 DIAGNOSIS — F902 Attention-deficit hyperactivity disorder, combined type: Secondary | ICD-10-CM | POA: Diagnosis not present

## 2020-04-22 DIAGNOSIS — F3181 Bipolar II disorder: Secondary | ICD-10-CM | POA: Diagnosis not present

## 2020-04-22 DIAGNOSIS — Z79891 Long term (current) use of opiate analgesic: Secondary | ICD-10-CM | POA: Diagnosis not present

## 2020-05-13 DIAGNOSIS — F3181 Bipolar II disorder: Secondary | ICD-10-CM | POA: Diagnosis not present

## 2020-05-13 DIAGNOSIS — F429 Obsessive-compulsive disorder, unspecified: Secondary | ICD-10-CM | POA: Diagnosis not present

## 2020-05-13 DIAGNOSIS — F902 Attention-deficit hyperactivity disorder, combined type: Secondary | ICD-10-CM | POA: Diagnosis not present

## 2020-06-30 DIAGNOSIS — F3181 Bipolar II disorder: Secondary | ICD-10-CM | POA: Diagnosis not present

## 2020-06-30 DIAGNOSIS — F902 Attention-deficit hyperactivity disorder, combined type: Secondary | ICD-10-CM | POA: Diagnosis not present

## 2020-06-30 DIAGNOSIS — F429 Obsessive-compulsive disorder, unspecified: Secondary | ICD-10-CM | POA: Diagnosis not present

## 2020-09-22 DIAGNOSIS — F3181 Bipolar II disorder: Secondary | ICD-10-CM | POA: Diagnosis not present

## 2020-09-22 DIAGNOSIS — F429 Obsessive-compulsive disorder, unspecified: Secondary | ICD-10-CM | POA: Diagnosis not present

## 2020-10-23 DIAGNOSIS — F429 Obsessive-compulsive disorder, unspecified: Secondary | ICD-10-CM | POA: Diagnosis not present

## 2020-10-23 DIAGNOSIS — F3181 Bipolar II disorder: Secondary | ICD-10-CM | POA: Diagnosis not present

## 2020-10-27 ENCOUNTER — Other Ambulatory Visit: Payer: Self-pay

## 2020-10-27 ENCOUNTER — Telehealth: Payer: Self-pay | Admitting: Internal Medicine

## 2020-10-27 MED ORDER — LOVASTATIN 20 MG PO TABS: ORAL_TABLET | ORAL | 3 refills | Status: DC

## 2020-10-27 MED ORDER — LEVOTHYROXINE SODIUM 50 MCG PO TABS: ORAL_TABLET | ORAL | 3 refills | Status: DC

## 2020-10-27 MED ORDER — LISINOPRIL 10 MG PO TABS: 10.0000 mg | ORAL_TABLET | Freq: Every day | ORAL | 3 refills | Status: DC

## 2020-10-27 NOTE — Telephone Encounter (Signed)
lovastatin (MEVACOR) 20 MG tablet lisinopril (ZESTRIL) 10 MG tablet levothyroxine (SYNTHROID) 50 MCG tablet [id number] - [place], [place] - [address] Phone:  [phone]  Fax:  [phone]     Patient requesting refill to new pharmacy listed

## 2020-10-29 ENCOUNTER — Ambulatory Visit: Payer: PPO | Admitting: Internal Medicine

## 2020-11-02 ENCOUNTER — Ambulatory Visit: Payer: PPO | Admitting: Internal Medicine

## 2020-11-03 ENCOUNTER — Ambulatory Visit: Payer: PPO | Admitting: Internal Medicine

## 2020-11-03 DIAGNOSIS — Z0289 Encounter for other administrative examinations: Secondary | ICD-10-CM

## 2020-11-09 ENCOUNTER — Other Ambulatory Visit: Payer: Self-pay

## 2020-11-09 ENCOUNTER — Ambulatory Visit (INDEPENDENT_AMBULATORY_CARE_PROVIDER_SITE_OTHER): Payer: PPO | Admitting: Internal Medicine

## 2020-11-09 ENCOUNTER — Other Ambulatory Visit (INDEPENDENT_AMBULATORY_CARE_PROVIDER_SITE_OTHER): Payer: PPO

## 2020-11-09 ENCOUNTER — Encounter: Payer: Self-pay | Admitting: Internal Medicine

## 2020-11-09 VITALS — BP 160/100 | HR 78 | Temp 98.3°F | Ht 62.0 in | Wt 148.0 lb

## 2020-11-09 DIAGNOSIS — R7302 Impaired glucose tolerance (oral): Secondary | ICD-10-CM | POA: Diagnosis not present

## 2020-11-09 DIAGNOSIS — E7849 Other hyperlipidemia: Secondary | ICD-10-CM

## 2020-11-09 DIAGNOSIS — I10 Essential (primary) hypertension: Secondary | ICD-10-CM | POA: Diagnosis not present

## 2020-11-09 DIAGNOSIS — E039 Hypothyroidism, unspecified: Secondary | ICD-10-CM | POA: Diagnosis not present

## 2020-11-09 DIAGNOSIS — E559 Vitamin D deficiency, unspecified: Secondary | ICD-10-CM | POA: Diagnosis not present

## 2020-11-09 DIAGNOSIS — E538 Deficiency of other specified B group vitamins: Secondary | ICD-10-CM

## 2020-11-09 DIAGNOSIS — Z0000 Encounter for general adult medical examination without abnormal findings: Secondary | ICD-10-CM | POA: Diagnosis not present

## 2020-11-09 DIAGNOSIS — N1831 Chronic kidney disease, stage 3a: Secondary | ICD-10-CM | POA: Diagnosis not present

## 2020-11-09 LAB — CBC WITH DIFFERENTIAL/PLATELET
Basophils Absolute: 0.1 10*3/uL (ref 0.0–0.1)
Basophils Relative: 1 % (ref 0.0–3.0)
Eosinophils Absolute: 0.1 10*3/uL (ref 0.0–0.7)
Eosinophils Relative: 1.7 % (ref 0.0–5.0)
HCT: 41.8 % (ref 36.0–46.0)
Hemoglobin: 13.8 g/dL (ref 12.0–15.0)
Lymphocytes Relative: 25.8 % (ref 12.0–46.0)
Lymphs Abs: 1.9 10*3/uL (ref 0.7–4.0)
MCHC: 33.1 g/dL (ref 30.0–36.0)
MCV: 100.4 fl — ABNORMAL HIGH (ref 78.0–100.0)
Monocytes Absolute: 0.6 10*3/uL (ref 0.1–1.0)
Monocytes Relative: 7.9 % (ref 3.0–12.0)
Neutro Abs: 4.8 10*3/uL (ref 1.4–7.7)
Neutrophils Relative %: 63.6 % (ref 43.0–77.0)
Platelets: 165 10*3/uL (ref 150.0–400.0)
RBC: 4.16 Mil/uL (ref 3.87–5.11)
RDW: 14.7 % (ref 11.5–15.5)
WBC: 7.5 10*3/uL (ref 4.0–10.5)

## 2020-11-09 MED ORDER — LEVOTHYROXINE SODIUM 50 MCG PO TABS: ORAL_TABLET | ORAL | 3 refills | Status: DC

## 2020-11-09 MED ORDER — LOVASTATIN 20 MG PO TABS: ORAL_TABLET | ORAL | 3 refills | Status: DC

## 2020-11-09 MED ORDER — LISINOPRIL 10 MG PO TABS: 10.0000 mg | ORAL_TABLET | Freq: Every day | ORAL | 3 refills | Status: DC

## 2020-11-09 NOTE — Assessment & Plan Note (Signed)
Age and sex appropriate education and counseling updated with regular exercise and diet Referrals for preventative services - none needed Immunizations addressed - declines flu shot Smoking counseling  - urged to quit, pt not ready Evidence for depression or other mood disorder - none significant Most recent labs reviewed. I have personally reviewed and have noted: 1) the patient's medical and social history 2) The patient's current medications and supplements 3) The patient's height, weight, and BMI have been recorded in the chart

## 2020-11-09 NOTE — Assessment & Plan Note (Signed)
Cont oral replacement, for vit d lab

## 2020-11-09 NOTE — Assessment & Plan Note (Signed)
Pt speaks of recent out of med, but I suspect ongoing non complaicne for at least 2 yrs, will check labs today, refil current dose levothyroxine 50 mcg and repeat TFTs in 4 wks

## 2020-11-09 NOTE — Patient Instructions (Signed)
Please continue all other medications as before, and refills have been done if requested.  Please have the pharmacy call with any other refills you may need.  Please continue your efforts at being more active, low cholesterol diet, and weight control.  You are otherwise up to date with prevention measures today.  Please keep your appointments with your specialists as you may have planned  Please go to the LAB at the blood drawing area for the tests to be done - at the [facility] site today  You will be contacted by phone if any changes need to be made immediately.  Otherwise, you will receive a letter about your results with an explanation, but please check with MyChart first.  Please remember to sign up for MyChart if you have not done so, as this will be important to you in the future with finding out test results, communicating by private email, and scheduling acute appointments online when needed.  Please make an Appointment to return in 6 months, or sooner if needed

## 2020-11-09 NOTE — Assessment & Plan Note (Signed)
For f/u lab 

## 2020-11-09 NOTE — Assessment & Plan Note (Signed)
Very severe, declines dietary referral, for lower chol diet

## 2020-11-09 NOTE — Assessment & Plan Note (Signed)
Uncontrolled, states out of med, to restart,  to f/u any worsening symptoms or concerns

## 2020-11-09 NOTE — Progress Notes (Addendum)
Patient ID: Susan Davidson, female   DOB: [birth year], 70 y.o.   MRN: [id number]         Chief Complaint:: wellness exam in the setting of significant ongoing psychiatric illness, medication noncompliance, hypothyrodism, low vit d, HLD, low b12, HTN and hyperglycemia        HPI:  Susan Davidson is a 70 y.o. female here for wellness exam; has gained over 20 lbs, not clear that she is taking all her thyroid medication, and in fact is not taking most of her meds for the last several wks. Famly with her state they have had difficulty with her psychiatric med refills from her provider as their computer at the pharmacy involved was down for 1 wk.  Has gained much wt in the setting of ongoing uncontrolled low thyroid for the past 2 yrs.  Family states she has been taking the Vit B12 and D consistently though.   Pt denies polydipsia, polyuria   Wt Readings from Last 3 Encounters:  11/09/20 148 lb (67.1 kg)  11/02/18 124 lb (56.2 kg)  05/11/17 109 lb (49.4 kg)   BP Readings from Last 3 Encounters:  11/09/20 (!) 160/100  03/11/20 130/80  11/02/18 (!) 142/90   Immunization History  Administered Date(s) Administered  . Moderna Sars-Covid-2 Vaccination 12/02/2019  . Tdap 10/19/2012   There are no preventive care reminders to display for this patient.      Past Medical History:  Diagnosis Date  . ANXIETY 02/12/2008   Qualifier: Diagnosis of  By: Jenny Reichmann MD, Hunt Oris   . DEPRESSION 02/12/2008   Qualifier: Diagnosis of  By: Elveria Royals   . HYPERLIPIDEMIA 02/12/2008   Qualifier: Diagnosis of  By: Elveria Royals   . HYPERTENSION 02/12/2008   Qualifier: Diagnosis of  By: Elveria Royals   . HYPOTHYROIDISM 02/12/2008   Qualifier: Diagnosis of  By: Elveria Royals   . Impaired glucose tolerance 08/27/2011  . OSTEOPENIA 02/12/2008   Qualifier: Diagnosis of  By: Jenny Reichmann MD, Hunt Oris   . VITAMIN D DEFICIENCY 04/14/2010   Qualifier: Diagnosis of  By: Jenny Reichmann MD, Hunt Oris    History  reviewed. No pertinent surgical history.  reports that she has been smoking. She has never used smokeless tobacco. She reports current alcohol use. She reports that she does not use drugs. family history includes Bipolar disorder in her sister; Heart disease in her father. Allergies  Allergen Reactions  . Other Anaphylaxis    Fire ant venom  . Epinephrine   . Influenza Vac Split Quad     fatigue   Current Outpatient Medications on File Prior to Visit  Medication Sig Dispense Refill  . ALPRAZolam (XANAX) 0.5 MG tablet Take 1 tablet (0.5 mg total) by mouth 2 (two) times daily as needed for anxiety. 60 tablet 1  . amphetamine-dextroamphetamine (ADDERALL) 20 MG tablet Take 20 mg by mouth daily. Take 2 tablets once a day    . PARoxetine (PAXIL) 20 MG tablet Take 1 tablet (20 mg total) by mouth daily. 90 tablet 3  . risperiDONE (RISPERDAL) 1 MG tablet Take 1 mg by mouth 2 (two) times daily.    . vitamin B-12 (CYANOCOBALAMIN) 1000 MCG tablet Take 1 tablet (1,000 mcg total) by mouth daily. 90 tablet 3   No current facility-administered medications on file prior to visit.        ROS:  All others reviewed and negative.  Objective        PE:  BP (!) 160/100   Pulse 78   Temp 98.3 F (36.8 C) (Oral)   Ht '5\' 2"'$  (1.575 m)   Wt 148 lb (67.1 kg)   SpO2 99%   BMI 27.07 kg/m                 Constitutional: Pt appears in NAD               HENT: Head: NCAT.                Right Ear: External ear normal.                 Left Ear: External ear normal.                Eyes: . Pupils are equal, round, and reactive to light. Conjunctivae and EOM are normal               Nose: without d/c or deformity               Neck: Neck supple. Gross normal ROM               Cardiovascular: Normal rate and regular rhythm.                 Pulmonary/Chest: Effort normal and breath sounds without rales or wheezing.                Abd:  Soft, NT, ND, + BS, no organomegaly               Neurological: Pt is alert.  At baseline orientation, motor grossly intact               Skin: Skin is warm. No rashes, no other new lesions, LE edema - none               Psychiatric: Pt behavior is normal without agitation   Micro: none  Cardiac tracings I have personally interpreted today:  none  Pertinent Radiological findings (summarize): none   Lab Results  Component Value Date   WBC 6.2 03/11/2020   HGB 15.6 (H) 03/11/2020   HCT 47.2 (H) 03/11/2020   PLT 235.0 03/11/2020   GLUCOSE 120 (H) 03/11/2020   CHOL 465 (H) 03/11/2020   TRIG 173.0 (H) 03/11/2020   HDL 70.20 03/11/2020   LDLDIRECT 137.5 10/19/2012   LDLCALC 360 (H) 03/11/2020   ALT 12 03/11/2020   AST 22 03/11/2020   NA 137 03/11/2020   K 4.0 03/11/2020   CL 96 03/11/2020   CREATININE 1.26 (H) 03/11/2020   BUN 16 03/11/2020   CO2 30 03/11/2020   TSH 34.78 (H) 03/11/2020   HGBA1C 6.3 11/02/2018   Assessment/Plan:  Susan Davidson is a 70 y.o. White or Caucasian [1] female with  has a past medical history of ANXIETY (02/12/2008), DEPRESSION (02/12/2008), HYPERLIPIDEMIA (02/12/2008), HYPERTENSION (02/12/2008), HYPOTHYROIDISM (02/12/2008), Impaired glucose tolerance (08/27/2011), OSTEOPENIA (02/12/2008), and VITAMIN D DEFICIENCY (04/14/2010). Preventative health care Age and sex appropriate education and counseling updated with regular exercise and diet Referrals for preventative services - none needed Immunizations addressed - declines flu shot Smoking counseling  - urged to quit, pt not ready Evidence for depression or other mood disorder - none significant Most recent labs reviewed. I have personally reviewed and have noted: 1) the patient's medical and social history 2) The patient's current medications and supplements 3) The patient's height, weight, and BMI have been recorded in the chart  B12 deficiency For f/u lab  Essential hypertension Uncontrolled, states out of med, to restart,  to f/u any worsening symptoms or concerns  HLD  (hyperlipidemia) Very severe, declines dietary referral, for lower chol diet  Hypothyroidism Pt speaks of recent out of med, but I suspect ongoing non complaicne for at least 2 yrs, will check labs today, refil current dose levothyroxine 50 mcg and repeat TFTs in 4 wks  Impaired glucose tolerance For a1c with labs  Vitamin D deficiency Cont oral replacement, for vit d lab  Followup: Return in about 6 months (around 05/09/2021).  Cathlean Cower, MD 11/09/2020 6:27 PM [facility]

## 2020-11-09 NOTE — Assessment & Plan Note (Signed)
For a1c with labs

## 2020-11-10 LAB — BASIC METABOLIC PANEL
BUN: 20 mg/dL (ref 6–23)
CO2: 32 mEq/L (ref 19–32)
Calcium: 10 mg/dL (ref 8.4–10.5)
Chloride: 99 mEq/L (ref 96–112)
Creatinine, Ser: 1.39 mg/dL — ABNORMAL HIGH (ref 0.40–1.20)
GFR: 38.15 mL/min — ABNORMAL LOW (ref >60.00)
Glucose, Bld: 100 mg/dL — ABNORMAL HIGH (ref 70–99)
Potassium: 4.8 mEq/L (ref 3.5–5.1)
Sodium: 139 mEq/L (ref 135–145)

## 2020-11-10 LAB — URINALYSIS, ROUTINE W REFLEX MICROSCOPIC
Bilirubin Urine: NEGATIVE
Hgb urine dipstick: NEGATIVE
Ketones, ur: NEGATIVE
Leukocytes,Ua: NEGATIVE
Nitrite: POSITIVE — AB
RBC / HPF: NONE SEEN (ref 0–?)
Specific Gravity, Urine: 1.02 (ref 1.000–1.030)
Total Protein, Urine: NEGATIVE
Urine Glucose: NEGATIVE
Urobilinogen, UA: 0.2 (ref 0.0–1.0)
pH: 5.5 (ref 5.0–8.0)

## 2020-11-10 LAB — VITAMIN B12: Vitamin B-12: 1056 pg/mL — ABNORMAL HIGH (ref 211–911)

## 2020-11-10 LAB — HEMOGLOBIN A1C: Hgb A1c MFr Bld: 6.6 % — ABNORMAL HIGH (ref 4.6–6.5)

## 2020-11-10 LAB — T4, FREE: Free T4: 0.38 ng/dL — ABNORMAL LOW (ref 0.60–1.60)

## 2020-11-10 LAB — LIPID PANEL
Cholesterol: 276 mg/dL — ABNORMAL HIGH (ref 0–200)
HDL: 63.7 mg/dL (ref >39.00)
LDL Cholesterol: 182 mg/dL — ABNORMAL HIGH (ref 0–99)
NonHDL: 211.91
Total CHOL/HDL Ratio: 4
Triglycerides: 151 mg/dL — ABNORMAL HIGH (ref 0.0–149.0)
VLDL: 30.2 mg/dL (ref 0.0–40.0)

## 2020-11-10 LAB — VITAMIN D 25 HYDROXY (VIT D DEFICIENCY, FRACTURES): VITD: 26.17 ng/mL — ABNORMAL LOW (ref 30.00–100.00)

## 2020-11-10 LAB — HEPATIC FUNCTION PANEL
ALT: 14 U/L (ref 0–35)
AST: 20 U/L (ref 0–37)
Albumin: 4.6 g/dL (ref 3.5–5.2)
Alkaline Phosphatase: 42 U/L (ref 39–117)
Bilirubin, Direct: 0.1 mg/dL (ref 0.0–0.3)
Total Bilirubin: 0.4 mg/dL (ref 0.2–1.2)
Total Protein: 7.7 g/dL (ref 6.0–8.3)

## 2020-11-10 LAB — TSH: TSH: 39.15 u[IU]/mL — ABNORMAL HIGH (ref 0.35–4.50)

## 2020-11-14 ENCOUNTER — Encounter: Payer: Self-pay | Admitting: Internal Medicine

## 2020-11-15 DIAGNOSIS — N189 Chronic kidney disease, unspecified: Secondary | ICD-10-CM | POA: Insufficient documentation

## 2020-11-15 NOTE — Addendum Note (Signed)
Addended by: Biagio Borg on: 11/15/2020 07:12 AM   Modules accepted: Orders

## 2020-11-30 DIAGNOSIS — F3181 Bipolar II disorder: Secondary | ICD-10-CM | POA: Diagnosis not present

## 2020-11-30 DIAGNOSIS — F429 Obsessive-compulsive disorder, unspecified: Secondary | ICD-10-CM | POA: Diagnosis not present

## 2020-12-29 DIAGNOSIS — F429 Obsessive-compulsive disorder, unspecified: Secondary | ICD-10-CM | POA: Diagnosis not present

## 2020-12-29 DIAGNOSIS — F3181 Bipolar II disorder: Secondary | ICD-10-CM | POA: Diagnosis not present

## 2021-01-26 ENCOUNTER — Other Ambulatory Visit: Payer: Self-pay

## 2021-01-26 ENCOUNTER — Observation Stay (HOSPITAL_COMMUNITY): Payer: Medicare HMO

## 2021-01-26 ENCOUNTER — Encounter (HOSPITAL_COMMUNITY): Payer: Self-pay | Admitting: Emergency Medicine

## 2021-01-26 ENCOUNTER — Telehealth: Payer: Self-pay | Admitting: Internal Medicine

## 2021-01-26 ENCOUNTER — Emergency Department (HOSPITAL_COMMUNITY): Payer: Medicare HMO

## 2021-01-26 ENCOUNTER — Inpatient Hospital Stay (HOSPITAL_COMMUNITY)
Admission: EM | Admit: 2021-01-26 | Discharge: 2021-01-30 | DRG: 065 | Disposition: A | Discharge status: Skilled Nursing Facility | Payer: Medicare HMO | Attending: Internal Medicine | Admitting: Internal Medicine

## 2021-01-26 DIAGNOSIS — R279 Unspecified lack of coordination: Secondary | ICD-10-CM | POA: Diagnosis not present

## 2021-01-26 DIAGNOSIS — N1831 Chronic kidney disease, stage 3a: Secondary | ICD-10-CM | POA: Diagnosis not present

## 2021-01-26 DIAGNOSIS — Z887 Allergy status to serum and vaccine status: Secondary | ICD-10-CM

## 2021-01-26 DIAGNOSIS — R4182 Altered mental status, unspecified: Secondary | ICD-10-CM | POA: Diagnosis not present

## 2021-01-26 DIAGNOSIS — M5126 Other intervertebral disc displacement, lumbar region: Secondary | ICD-10-CM | POA: Diagnosis not present

## 2021-01-26 DIAGNOSIS — Z8249 Family history of ischemic heart disease and other diseases of the circulatory system: Secondary | ICD-10-CM

## 2021-01-26 DIAGNOSIS — M6281 Muscle weakness (generalized): Secondary | ICD-10-CM | POA: Diagnosis not present

## 2021-01-26 DIAGNOSIS — I6523 Occlusion and stenosis of bilateral carotid arteries: Secondary | ICD-10-CM | POA: Diagnosis not present

## 2021-01-26 DIAGNOSIS — Z28311 Partially vaccinated for covid-19: Secondary | ICD-10-CM

## 2021-01-26 DIAGNOSIS — G8194 Hemiplegia, unspecified affecting left nondominant side: Secondary | ICD-10-CM | POA: Diagnosis present

## 2021-01-26 DIAGNOSIS — R296 Repeated falls: Secondary | ICD-10-CM | POA: Diagnosis not present

## 2021-01-26 DIAGNOSIS — I6389 Other cerebral infarction: Secondary | ICD-10-CM | POA: Diagnosis not present

## 2021-01-26 DIAGNOSIS — I672 Cerebral atherosclerosis: Secondary | ICD-10-CM | POA: Diagnosis present

## 2021-01-26 DIAGNOSIS — I129 Hypertensive chronic kidney disease with stage 1 through stage 4 chronic kidney disease, or unspecified chronic kidney disease: Secondary | ICD-10-CM | POA: Diagnosis present

## 2021-01-26 DIAGNOSIS — I10 Essential (primary) hypertension: Secondary | ICD-10-CM | POA: Diagnosis present

## 2021-01-26 DIAGNOSIS — Z8616 Personal history of COVID-19: Secondary | ICD-10-CM | POA: Diagnosis not present

## 2021-01-26 DIAGNOSIS — R531 Weakness: Secondary | ICD-10-CM

## 2021-01-26 DIAGNOSIS — R29706 NIHSS score 6: Secondary | ICD-10-CM | POA: Diagnosis present

## 2021-01-26 DIAGNOSIS — I671 Cerebral aneurysm, nonruptured: Secondary | ICD-10-CM | POA: Diagnosis not present

## 2021-01-26 DIAGNOSIS — E538 Deficiency of other specified B group vitamins: Secondary | ICD-10-CM | POA: Diagnosis present

## 2021-01-26 DIAGNOSIS — F99 Mental disorder, not otherwise specified: Secondary | ICD-10-CM | POA: Diagnosis present

## 2021-01-26 DIAGNOSIS — E039 Hypothyroidism, unspecified: Secondary | ICD-10-CM | POA: Diagnosis present

## 2021-01-26 DIAGNOSIS — Z7401 Bed confinement status: Secondary | ICD-10-CM | POA: Diagnosis not present

## 2021-01-26 DIAGNOSIS — Z79890 Hormone replacement therapy (postmenopausal): Secondary | ICD-10-CM

## 2021-01-26 DIAGNOSIS — Z818 Family history of other mental and behavioral disorders: Secondary | ICD-10-CM | POA: Diagnosis not present

## 2021-01-26 DIAGNOSIS — R410 Disorientation, unspecified: Secondary | ICD-10-CM

## 2021-01-26 DIAGNOSIS — G319 Degenerative disease of nervous system, unspecified: Secondary | ICD-10-CM | POA: Diagnosis not present

## 2021-01-26 DIAGNOSIS — F17210 Nicotine dependence, cigarettes, uncomplicated: Secondary | ICD-10-CM | POA: Diagnosis present

## 2021-01-26 DIAGNOSIS — Z720 Tobacco use: Secondary | ICD-10-CM | POA: Diagnosis not present

## 2021-01-26 DIAGNOSIS — R262 Difficulty in walking, not elsewhere classified: Secondary | ICD-10-CM | POA: Diagnosis not present

## 2021-01-26 DIAGNOSIS — R29898 Other symptoms and signs involving the musculoskeletal system: Secondary | ICD-10-CM | POA: Diagnosis not present

## 2021-01-26 DIAGNOSIS — R471 Dysarthria and anarthria: Secondary | ICD-10-CM | POA: Diagnosis not present

## 2021-01-26 DIAGNOSIS — F17200 Nicotine dependence, unspecified, uncomplicated: Secondary | ICD-10-CM

## 2021-01-26 DIAGNOSIS — M48061 Spinal stenosis, lumbar region without neurogenic claudication: Secondary | ICD-10-CM | POA: Diagnosis present

## 2021-01-26 DIAGNOSIS — Z888 Allergy status to other drugs, medicaments and biological substances status: Secondary | ICD-10-CM | POA: Diagnosis not present

## 2021-01-26 DIAGNOSIS — E785 Hyperlipidemia, unspecified: Secondary | ICD-10-CM | POA: Diagnosis present

## 2021-01-26 DIAGNOSIS — Z79899 Other long term (current) drug therapy: Secondary | ICD-10-CM

## 2021-01-26 DIAGNOSIS — Z20822 Contact with and (suspected) exposure to covid-19: Secondary | ICD-10-CM | POA: Diagnosis not present

## 2021-01-26 DIAGNOSIS — I63521 Cerebral infarction due to unspecified occlusion or stenosis of right anterior cerebral artery: Secondary | ICD-10-CM | POA: Diagnosis not present

## 2021-01-26 DIAGNOSIS — E559 Vitamin D deficiency, unspecified: Secondary | ICD-10-CM | POA: Diagnosis not present

## 2021-01-26 DIAGNOSIS — E782 Mixed hyperlipidemia: Secondary | ICD-10-CM | POA: Diagnosis not present

## 2021-01-26 DIAGNOSIS — M47817 Spondylosis without myelopathy or radiculopathy, lumbosacral region: Secondary | ICD-10-CM | POA: Diagnosis not present

## 2021-01-26 DIAGNOSIS — R41841 Cognitive communication deficit: Secondary | ICD-10-CM | POA: Diagnosis not present

## 2021-01-26 DIAGNOSIS — M5127 Other intervertebral disc displacement, lumbosacral region: Secondary | ICD-10-CM | POA: Diagnosis not present

## 2021-01-26 DIAGNOSIS — F419 Anxiety disorder, unspecified: Secondary | ICD-10-CM | POA: Diagnosis present

## 2021-01-26 DIAGNOSIS — I639 Cerebral infarction, unspecified: Secondary | ICD-10-CM | POA: Diagnosis present

## 2021-01-26 DIAGNOSIS — I63533 Cerebral infarction due to unspecified occlusion or stenosis of bilateral posterior cerebral arteries: Secondary | ICD-10-CM | POA: Diagnosis not present

## 2021-01-26 DIAGNOSIS — I6782 Cerebral ischemia: Secondary | ICD-10-CM | POA: Diagnosis not present

## 2021-01-26 DIAGNOSIS — R2689 Other abnormalities of gait and mobility: Secondary | ICD-10-CM | POA: Diagnosis not present

## 2021-01-26 LAB — CBC WITH DIFFERENTIAL/PLATELET
Abs Immature Granulocytes: 0.03 10*3/uL (ref 0.00–0.07)
Basophils Absolute: 0.1 10*3/uL (ref 0.0–0.1)
Basophils Relative: 1 %
Eosinophils Absolute: 0.1 10*3/uL (ref 0.0–0.5)
Eosinophils Relative: 1 %
HCT: 45.5 % (ref 36.0–46.0)
Hemoglobin: 14.5 g/dL (ref 12.0–15.0)
Immature Granulocytes: 0 %
Lymphocytes Relative: 10 %
Lymphs Abs: 1.1 10*3/uL (ref 0.7–4.0)
MCH: 33.3 pg (ref 26.0–34.0)
MCHC: 31.9 g/dL (ref 30.0–36.0)
MCV: 104.6 fL — ABNORMAL HIGH (ref 80.0–100.0)
Monocytes Absolute: 0.7 10*3/uL (ref 0.1–1.0)
Monocytes Relative: 7 %
Neutro Abs: 9 10*3/uL — ABNORMAL HIGH (ref 1.7–7.7)
Neutrophils Relative %: 81 %
Platelets: 200 10*3/uL (ref 150–400)
RBC: 4.35 MIL/uL (ref 3.87–5.11)
RDW: 14.2 % (ref 11.5–15.5)
WBC: 10.9 10*3/uL — ABNORMAL HIGH (ref 4.0–10.5)
nRBC: 0 % (ref 0.0–0.2)

## 2021-01-26 LAB — COMPREHENSIVE METABOLIC PANEL
ALT: 18 U/L (ref 0–44)
AST: 21 U/L (ref 15–41)
Albumin: 4.3 g/dL (ref 3.5–5.0)
Alkaline Phosphatase: 48 U/L (ref 38–126)
Anion gap: 9 (ref 5–15)
BUN: 19 mg/dL (ref 8–23)
CO2: 24 mmol/L (ref 22–32)
Calcium: 9 mg/dL (ref 8.9–10.3)
Chloride: 104 mmol/L (ref 98–111)
Creatinine, Ser: 1.22 mg/dL — ABNORMAL HIGH (ref 0.44–1.00)
GFR, Estimated: 47 mL/min — ABNORMAL LOW (ref >60)
Glucose, Bld: 117 mg/dL — ABNORMAL HIGH (ref 70–99)
Potassium: 4.1 mmol/L (ref 3.5–5.1)
Sodium: 137 mmol/L (ref 135–145)
Total Bilirubin: 1 mg/dL (ref 0.3–1.2)
Total Protein: 7.5 g/dL (ref 6.5–8.1)

## 2021-01-26 LAB — RESP PANEL BY RT-PCR (FLU A&B, COVID) ARPGX2
Influenza A by PCR: NEGATIVE
Influenza B by PCR: NEGATIVE
SARS Coronavirus 2 by RT PCR: NEGATIVE

## 2021-01-26 LAB — FOLATE: Folate: 15.6 ng/mL (ref >5.9)

## 2021-01-26 MED ORDER — ACETAMINOPHEN 160 MG/5ML PO SOLN: 650.0000 mg | ORAL | Status: DC | PRN

## 2021-01-26 MED ORDER — PAROXETINE HCL 20 MG PO TABS
20.0000 mg | ORAL_TABLET | Freq: Every day | ORAL | Status: DC
  Administered 2021-01-27 – 2021-01-30 (×4): 20 mg via ORAL
  Filled 2021-01-26 (×4): qty 1

## 2021-01-26 MED ORDER — LEVOTHYROXINE SODIUM 50 MCG PO TABS
50.0000 ug | ORAL_TABLET | Freq: Every day | ORAL | Status: DC
  Administered 2021-01-27 – 2021-01-28 (×2): 50 ug via ORAL
  Filled 2021-01-26 (×2): qty 1

## 2021-01-26 MED ORDER — PRAVASTATIN SODIUM 10 MG PO TABS: 20.0000 mg | ORAL_TABLET | Freq: Every day | ORAL | Status: DC

## 2021-01-26 MED ORDER — LISINOPRIL 10 MG PO TABS
10.0000 mg | ORAL_TABLET | Freq: Every day | ORAL | Status: DC
  Administered 2021-01-27: 10 mg via ORAL
  Filled 2021-01-26: qty 1

## 2021-01-26 MED ORDER — ALPRAZOLAM 0.5 MG PO TABS
0.5000 mg | ORAL_TABLET | Freq: Two times a day (BID) | ORAL | Status: DC | PRN
  Administered 2021-01-26 – 2021-01-30 (×5): 0.5 mg via ORAL
  Filled 2021-01-26 (×5): qty 1

## 2021-01-26 MED ORDER — HEPARIN SODIUM (PORCINE) 5000 UNIT/ML IJ SOLN
5000.0000 [IU] | Freq: Three times a day (TID) | INTRAMUSCULAR | Status: DC
  Administered 2021-01-27 – 2021-01-30 (×10): 5000 [IU] via SUBCUTANEOUS
  Filled 2021-01-26 (×10): qty 1

## 2021-01-26 MED ORDER — VITAMIN B-12 1000 MCG PO TABS
1000.0000 ug | ORAL_TABLET | Freq: Every day | ORAL | Status: DC
  Administered 2021-01-27 – 2021-01-30 (×4): 1000 ug via ORAL
  Filled 2021-01-26 (×4): qty 1

## 2021-01-26 MED ORDER — STROKE: EARLY STAGES OF RECOVERY BOOK: Freq: Once | Status: AC

## 2021-01-26 MED ORDER — ACETAMINOPHEN 325 MG PO TABS: 650.0000 mg | ORAL_TABLET | ORAL | Status: DC | PRN

## 2021-01-26 MED ORDER — ACETAMINOPHEN 650 MG RE SUPP: 650.0000 mg | RECTAL | Status: DC | PRN

## 2021-01-26 MED ORDER — ONDANSETRON HCL 4 MG/2ML IJ SOLN: 4.0000 mg | Freq: Four times a day (QID) | INTRAMUSCULAR | Status: DC | PRN

## 2021-01-26 MED ORDER — RISPERIDONE 1 MG PO TABS
1.0000 mg | ORAL_TABLET | Freq: Two times a day (BID) | ORAL | Status: DC
  Administered 2021-01-26 – 2021-01-30 (×8): 1 mg via ORAL
  Filled 2021-01-26 (×8): qty 1

## 2021-01-26 NOTE — ED Triage Notes (Signed)
Pt the the ED from home with complaints of weakness causing immobility starting today. EMS reports she is choosing not to walk. On assessment patient is able to move bilateral legs by lifting them off the stretcher and bending her knees.  EMS reports new onset psychosis due to her comment concerning her "skin falling off when rubbed." She has been making statements similar in nature for the past 3 days.  Her systolic BP was in the 123456 in route and she states she did not take her BP med today.

## 2021-01-26 NOTE — ED Provider Notes (Signed)
[facility] Provider Note   CSN: [id number] Arrival date & time: 01/26/21  1702     History Chief Complaint  Patient presents with  . Altered Mental Status    Susan Davidson is a 70 y.o. female with pertinent past medical history of anxiety, depression, hypertension, hypothyroidism that presents emergency department today for complaints of altered mental status and weakness causing immobility starting yesterday.  According to EMS, they state that she was choosing not to walk when they got to her house, reports that they think she was in a psychotic break when they picked her up.  EMS reports multiple comments about her skin falling off.  When I spoke to husband, he states that she started having weakness yesterday, states that he came home from work and found her on the couch and she reports that she was unable to get up.  He reports that he tried moving her to the bed and then she fell off the bed.  Unsure if she hit her head.  Denies any occurrences like this before.  Patient is on multiple psychiatric medications including Xanax, Paxil, Risperdal.  Denies any substance use besides 1 alcoholic drink a day.  Patient is alert and oriented x2, level 5 caveat due to mental status change.  HPI     Past Medical History:  Diagnosis Date  . ANXIETY 02/12/2008   Qualifier: Diagnosis of  By: Jenny Reichmann MD, Hunt Oris   . DEPRESSION 02/12/2008   Qualifier: Diagnosis of  By: Elveria Royals   . HYPERLIPIDEMIA 02/12/2008   Qualifier: Diagnosis of  By: Elveria Royals   . HYPERTENSION 02/12/2008   Qualifier: Diagnosis of  By: Elveria Royals   . HYPOTHYROIDISM 02/12/2008   Qualifier: Diagnosis of  By: Elveria Royals   . Impaired glucose tolerance 08/27/2011  . OSTEOPENIA 02/12/2008   Qualifier: Diagnosis of  By: Jenny Reichmann MD, Hunt Oris   . VITAMIN D DEFICIENCY 04/14/2010   Qualifier: Diagnosis of  By: Jenny Reichmann MD, Hunt Oris     Patient Active Problem List    Diagnosis Date Noted  . Ambulatory dysfunction 01/26/2021  . CKD (chronic kidney disease) 11/15/2020  . B12 deficiency 11/09/2020  . Urinary frequency 11/02/2018  . Daytime somnolence 10/19/2012  . Impaired glucose tolerance 08/27/2011  . Preventative health care 08/27/2011  . Vitamin D deficiency 04/14/2010  . HYPERSOMNIA 02/19/2009  . Hypothyroidism 02/12/2008  . HLD (hyperlipidemia) 02/12/2008  . ANXIETY 02/12/2008  . Depression 02/12/2008  . Essential hypertension 02/12/2008  . OSTEOPENIA 02/12/2008    History reviewed. No pertinent surgical history.   OB History   No obstetric history on file.     Family History  Problem Relation Age of Onset  . Heart disease Father   . Bipolar disorder Sister   . Diabetes Neg Hx     Social History   Tobacco Use  . Smoking status: Current Every Day Smoker  . Smokeless tobacco: Never Used  Vaping Use  . Vaping Use: Never used  Substance Use Topics  . Alcohol use: Yes  . Drug use: No    Home Medications Prior to Admission medications   Medication Sig Start Date End Date Taking? Authorizing Provider  ALPRAZolam Duanne Moron) 0.5 MG tablet Take 1 tablet (0.5 mg total) by mouth 2 (two) times daily as needed for anxiety. 03/29/19   Biagio Borg, MD  amphetamine-dextroamphetamine (ADDERALL) 20 MG tablet Take 20 mg by mouth daily. Take 2 tablets once a day  [provider]  levothyroxine (SYNTHROID) 50 MCG tablet TAKE 1 TABLET BY MOUTH DAILY BEFORE BREAKFAST 11/09/20   Biagio Borg, MD  lisinopril (ZESTRIL) 10 MG tablet Take 1 tablet (10 mg total) by mouth daily. 11/09/20   Biagio Borg, MD  lovastatin (MEVACOR) 20 MG tablet TAKE 1 TABLET BY MOUTH EVERY EVENING AT 6 PM 11/09/20   Biagio Borg, MD  PARoxetine (PAXIL) 20 MG tablet Take 1 tablet (20 mg total) by mouth daily. 11/02/18   Biagio Borg, MD  risperiDONE (RISPERDAL) 1 MG tablet Take 1 mg by mouth 2 (two) times daily. 10/27/20   [provider]  vitamin B-12  (CYANOCOBALAMIN) 1000 MCG tablet Take 1 tablet (1,000 mcg total) by mouth daily. 03/14/20   Biagio Borg, MD    Allergies    Other, Epinephrine, and Influenza vac split quad  Review of Systems   Review of Systems  Unable to perform ROS: Mental status change    Physical Exam Updated Vital Signs BP (!) 130/105   Pulse 86   Temp 97.9 F (36.6 C) (Oral)   Resp 20   Ht '5\' 4"'$  (1.626 m)   Wt 65.8 kg   SpO2 98%   BMI 24.89 kg/m   Physical Exam Constitutional:      General: She is not in acute distress.    Appearance: Normal appearance. She is not ill-appearing, toxic-appearing or diaphoretic.  HENT:     Mouth/Throat:     Mouth: Mucous membranes are moist.     Pharynx: Oropharynx is clear.  Eyes:     General: No scleral icterus.    Extraocular Movements: Extraocular movements intact.     Pupils: Pupils are equal, round, and reactive to light.  Cardiovascular:     Rate and Rhythm: Normal rate and regular rhythm.     Pulses: Normal pulses.     Heart sounds: Normal heart sounds.  Pulmonary:     Effort: Pulmonary effort is normal. No respiratory distress.     Breath sounds: Normal breath sounds. No stridor. No wheezing, rhonchi or rales.  Chest:     Chest wall: No tenderness.  Abdominal:     General: Abdomen is flat. There is no distension.     Palpations: Abdomen is soft.     Tenderness: There is no abdominal tenderness. There is no guarding or rebound.  Genitourinary:    Comments: Chaperone present.  Good rectal tone. Musculoskeletal:        General: No swelling or tenderness. Normal range of motion.     Cervical back: Normal range of motion and neck supple. No rigidity or tenderness.     Right lower leg: No edema.     Left lower leg: No edema.     Comments: Patient without any neck rigidity, no midline tenderness to thoracic or lumbar spine.  Skin:    General: Skin is warm and dry.     Capillary Refill: Capillary refill takes less than 2 seconds.     Coloration: Skin  is not pale.  Neurological:     Mental Status: She is alert.     Comments: Alert and oriented to self and place.  Patient thinks it is July 2021.[name] Kitchen Clear speech. No facial droop. CNIII-XII grossly intact. Bilateral upper and lower extremities' sensation grossly intact. 5/5 symmetric strength with grip strength and with plantar and dorsi flexion bilaterally.   Negative pronator drift.  When I asked patient to stand, she becomes limp and will  not attempt to stand.  Will fall back in the bed.   Psychiatric:        Mood and Affect: Mood normal.        Behavior: Behavior normal.     ED Results / Procedures / Treatments   Labs (all labs ordered are listed, but only abnormal results are displayed) Labs Reviewed  COMPREHENSIVE METABOLIC PANEL - Abnormal; Notable for the following components:      Result Value   Glucose, Bld 117 (*)    Creatinine, Ser 1.22 (*)    GFR, Estimated 47 (*)    All other components within normal limits  CBC WITH DIFFERENTIAL/PLATELET - Abnormal; Notable for the following components:   WBC 10.9 (*)    MCV 104.6 (*)    Neutro Abs 9.0 (*)    All other components within normal limits  RESP PANEL BY RT-PCR (FLU A&B, COVID) ARPGX2  URINALYSIS, ROUTINE W REFLEX MICROSCOPIC    EKG None  Radiology CT Head Wo Contrast  Result Date: 01/26/2021 CLINICAL DATA:  Delirium.  Three falls this week due to weakness. EXAM: CT HEAD WITHOUT CONTRAST TECHNIQUE: Contiguous axial images were obtained from the base of the skull through the vertex without intravenous contrast. COMPARISON:  None. FINDINGS: Brain: No evidence of acute infarction, hemorrhage, hydrocephalus, extra-axial collection or mass lesion/mass effect. Mild periventricular and deep white matter hypodensity typical of chronic small vessel ischemia. 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 Vascular: No hyperdense vessel. Skull: No fracture or focal lesion.  Sinuses/Orbits: Sclerosis involving the left mandibular condyle is likely related to degenerative change at the temporomandibular joint. Paranasal sinuses are clear. No mastoid effusion. No acute orbital abnormality. Other: None. IMPRESSION: 1. No acute intracranial abnormality. 2. Mild chronic small vessel ischemia. 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 Electronically Signed   By: Keith Rake M.D.   On: 01/26/2021 18:59    Procedures Procedures   Medications Ordered in ED Medications - No data to display  ED Course  I have reviewed the triage vital signs and the nursing notes.  Pertinent labs & imaging results that were available during my care of the patient were reviewed by me and considered in my medical decision making (see chart for details).    MDM Rules/Calculators/A&P                         AUDYN HAACK is a 70 y.o. female with pertinent past medical history of anxiety, depression, hypertension, hypothyroidism that presents emergency department today for complaints of weakness causing immobility starting yesterday.  Patient without any obvious injury, good rectal tone.  Will obtain CT head and basic labs and urinalysis at this time.  No history of acute psychosis.  Work-up today unremarkable with CBC and CMP without any major derangements.  CT head without any acute intracranial abnormality, does show remote lacunar infarcts.  Patient will need to be admitted for altered mental status and inability to walk.   Spoke to Dr. Orlin Hilding, hospitalist who will admit the patient.  CT lumbar spine and urinalysis pending.  The patient appears reasonably stabilized for admission considering the current resources, flow, and capabilities available in the ED at this time, and I doubt any other [facility] requiring further screening and/or treatment in the ED prior to admission.  I discussed this case with my attending physician who  cosigned this note  including patient's presenting symptoms, physical exam, and planned diagnostics and interventions. Attending physician stated agreement with plan or made changes to plan which were implemented.   Final Clinical Impression(s) / ED Diagnoses Final diagnoses:  Delirium  Weakness    Rx / DC Orders ED Discharge Orders    None       Alfredia Client, PA-C 01/26/21 2031    Milton Ferguson, MD 01/26/21 2255

## 2021-01-26 NOTE — H&P (Signed)
TRH H&P    Patient Demographics:    Susan Davidson, is a 70 y.o. female  MRN: [id number]  DOB - [birth year]  Admit Date - 01/26/2021  Referring MD/NP/PA: Posey Pronto  Outpatient Primary MD for the patient is Biagio Borg, MD  Patient coming from: Home  Chief complaint- "Couldn't walk"   HPI:    Susan Davidson  is a 70 y.o. female, with history of vitamin D deficiency, vitamin B12 deficiency, hypothyroidism, hypertension, hyperlipidemia, psych disorder, and more presents to ED with a chief complaint.  Patient is a notably poor historian.  She reports that she had sudden onset of ambulatory dysfunction today.  She reports her last known well was yesterday.  She is not able to give more specific times than that.  She reports that when she tries to walk she falls to the left.  She reports that this is never happened before, but that she has had trouble with falling to the right in the past.  She reports that her trouble falling to the right started after she took the first COVID shot, and she thinks that this is related to that as well.  She did the first COVID shot in January 2021.  She denies any numbness or injury to her back or legs.  She reports that she did fall to the ground and bumped her head when she first noted that she could not walk.  She did not have loss of consciousness.  She reports that she landed flat on her left side, but did not hit her hip or her knee.  She has no pain from the fall.  Patient reports that she has had decreased appetite today.  No dysphagia, no difficulty talking.  She reports no change in vision, and possible 2 days of muffled hearing.  She reports that she does have allergies.  Her last normal bowel movement was yesterday.  She has no other complaints at this time.  Patient does smoke half a pack per day.  She drinks 1 shot of rum every night.  She does not use illicit drugs.  She was  vaccinated x1 for COVID.  Patient is full code.  Patient is exceedingly concerned.  Her Xanax even though she is almost completely sedated in front of me with flat affect.  I have explained to her that we do not want to over sedate her, since Xanax will only be given in response to anxiety.  In the ED Temp 97.9, heart rate 86-1 40, respiratory rate 20, blood pressure 130/105, satting at 98% on room air CT head shows no acute intracranial abnormalities.  Mild chronic small vessel ischemia.  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 EKG shows a heart rate of 86, QTc 488, sinus rhythm No leukocytosis with a white blood cell count 10.9, hemoglobin 14.5, platelets 200 Chemistry panel was mostly unremarkable with a creatinine that is at her baseline of 1.22 CT lumbar is pending and ED staff has been asked to call inpatient staff with any results that  the radiologist notifies them with Admission was requested for further work-up of sudden onset ambulatory dysfunction    Review of systems:    In addition to the HPI above,  No Fever-chills, No Headache, No changes with Vision  No problems swallowing food or Liquids, No Chest pain, Cough or Shortness of Breath, No Abdominal pain, No Nausea or Vomiting, bowel movements are regular, No Blood in stool or Urine, No dysuria, No new skin rashes or bruises, No new joints pains-aches,  No recent weight gain or loss, No polyuria, polydypsia or polyphagia, No significant Mental Stressors.  All other systems reviewed and are negative.    Past History of the following :    Past Medical History:  Diagnosis Date  . ANXIETY 02/12/2008   Qualifier: Diagnosis of  By: Jenny Reichmann MD, Hunt Oris   . DEPRESSION 02/12/2008   Qualifier: Diagnosis of  By: Elveria Royals   . HYPERLIPIDEMIA 02/12/2008   Qualifier: Diagnosis of  By: Elveria Royals   . HYPERTENSION 02/12/2008   Qualifier: Diagnosis of  By: Elveria Royals   . HYPOTHYROIDISM 02/12/2008   Qualifier: Diagnosis of  By: Elveria Royals   . Impaired glucose tolerance 08/27/2011  . OSTEOPENIA 02/12/2008   Qualifier: Diagnosis of  By: Jenny Reichmann MD, Hunt Oris   . VITAMIN D DEFICIENCY 04/14/2010   Qualifier: Diagnosis of  By: Jenny Reichmann MD, Hunt Oris       History reviewed. No pertinent surgical history.    Social History:      Social History   Tobacco Use  . Smoking status: Current Every Day Smoker  . Smokeless tobacco: Never Used  Substance Use Topics  . Alcohol use: Yes       Family History :     Family History  Problem Relation Age of Onset  . Heart disease Father   . Bipolar disorder Sister   . Diabetes Neg Hx       Home Medications:   Prior to Admission medications   Medication Sig Start Date End Date Taking? Authorizing Provider  ALPRAZolam Duanne Moron) 0.5 MG tablet Take 1 tablet (0.5 mg total) by mouth 2 (two) times daily as needed for anxiety. 03/29/19   Biagio Borg, MD  amphetamine-dextroamphetamine (ADDERALL) 20 MG tablet Take 20 mg by mouth daily. Take 2 tablets once a day    [provider]  levothyroxine (SYNTHROID) 50 MCG tablet TAKE 1 TABLET BY MOUTH DAILY BEFORE BREAKFAST 11/09/20   Biagio Borg, MD  lisinopril (ZESTRIL) 10 MG tablet Take 1 tablet (10 mg total) by mouth daily. 11/09/20   Biagio Borg, MD  lovastatin (MEVACOR) 20 MG tablet TAKE 1 TABLET BY MOUTH EVERY EVENING AT 6 PM 11/09/20   Biagio Borg, MD  PARoxetine (PAXIL) 20 MG tablet Take 1 tablet (20 mg total) by mouth daily. 11/02/18   Biagio Borg, MD  risperiDONE (RISPERDAL) 1 MG tablet Take 1 mg by mouth 2 (two) times daily. 10/27/20   [provider]  vitamin B-12 (CYANOCOBALAMIN) 1000 MCG tablet Take 1 tablet (1,000 mcg total) by mouth daily. 03/14/20   Biagio Borg, MD     Allergies:     Allergies  Allergen Reactions  . Other Anaphylaxis    Fire ant venom  . Epinephrine   . Influenza Vac Split Quad     fatigue      Physical Exam:   Vitals  Blood pressure (!) 183/77, pulse 86, temperature 97.9 F (36.6  C), temperature source Oral, resp. rate 15, height '5\' 4"'$  (1.626 m), weight 65.8 kg, SpO2 98 %.  1.  General: Patient is lying supine in bed in no acute distress  2. Psychiatric: Flat affect, behavior normal for situation, oriented to person and place, not to time or president  3. Neurologic: cranial nerves II through XII are intact, equal sensation in the upper extremities bilaterally, both lower extremities are 5 out of 5 strength, but left upper extremity is very subtly weaker than the right upper extremity.  Equal sensation in the lower extremities bilaterally.  Left lower extremity is slightly weaker than the right lower extremity, both are 5 out of 5 strength.  Finger-to-nose intact.  Heel-to-shin intact, although slower with the left leg.  4. HEENMT:  Head is atraumatic, normocephalic, pupils reactive to light, neck is supple, trachea is midline, mucous membranes are moist  5. Respiratory : Lungs are clear to auscultation bilaterally without wheezes, rales, rhonchi, no clubbing, no cyanosis  6. Cardiovascular : Heart rate is normal, rhythm is regular, no murmurs rubs or gallops, no peripheral edema, peripheral pulses palpated  7. Gastrointestinal:  Abdomen is soft, nondistended, nontender to palpation, no masses palpated, bowel sounds active  8. Skin:  Skin is warm dry and intact without acute lesions noted to exam  9.Musculoskeletal:  No peripheral edema, no calf tenderness, peripheral pulses palpated    Data Review:    CBC Recent Labs  Lab 01/26/21 1814  WBC 10.9*  HGB 14.5  HCT 45.5  PLT 200  MCV 104.6*  MCH 33.3  MCHC 31.9  RDW 14.2  LYMPHSABS 1.1  MONOABS 0.7  EOSABS 0.1  BASOSABS 0.1   ------------------------------------------------------------------------------------------------------------------  Results for orders placed or performed during the hospital  encounter of 01/26/21 (from the past 48 hour(s))  Comprehensive metabolic panel     Status: Abnormal   Collection Time: 01/26/21  6:14 PM  Result Value Ref Range   Sodium 137 135 - 145 mmol/L   Potassium 4.1 3.5 - 5.1 mmol/L   Chloride 104 98 - 111 mmol/L   CO2 24 22 - 32 mmol/L   Glucose, Bld 117 (H) 70 - 99 mg/dL    Comment: Glucose reference range applies only to samples taken after fasting for at least 8 hours.   BUN 19 8 - 23 mg/dL   Creatinine, Ser 1.22 (H) 0.44 - 1.00 mg/dL   Calcium 9.0 8.9 - 10.3 mg/dL   Total Protein 7.5 6.5 - 8.1 g/dL   Albumin 4.3 3.5 - 5.0 g/dL   AST 21 15 - 41 U/L   ALT 18 0 - 44 U/L   Alkaline Phosphatase 48 38 - 126 U/L   Total Bilirubin 1.0 0.3 - 1.2 mg/dL   GFR, Estimated 47 (L) >60 mL/min    Comment: (NOTE) Calculated using the CKD-EPI Creatinine Equation (2021)    Anion gap 9 5 - 15    Comment: Performed at [facility], [address], [place], [place] [address]  CBC with Differential     Status: Abnormal   Collection Time: 01/26/21  6:14 PM  Result Value Ref Range   WBC 10.9 (H) 4.0 - 10.5 K/uL   RBC 4.35 3.87 - 5.11 MIL/uL   Hemoglobin 14.5 12.0 - 15.0 g/dL   HCT 45.5 36.0 - 46.0 %   MCV 104.6 (H) 80.0 - 100.0 fL   MCH 33.3 26.0 - 34.0 pg   MCHC 31.9 30.0 - 36.0 g/dL   RDW 14.2 11.5 -  15.5 %   Platelets 200 150 - 400 K/uL   nRBC 0.0 0.0 - 0.2 %   Neutrophils Relative % 81 %   Neutro Abs 9.0 (H) 1.7 - 7.7 K/uL   Lymphocytes Relative 10 %   Lymphs Abs 1.1 0.7 - 4.0 K/uL   Monocytes Relative 7 %   Monocytes Absolute 0.7 0.1 - 1.0 K/uL   Eosinophils Relative 1 %   Eosinophils Absolute 0.1 0.0 - 0.5 K/uL   Basophils Relative 1 %   Basophils Absolute 0.1 0.0 - 0.1 K/uL   Immature Granulocytes 0 %   Abs Immature Granulocytes 0.03 0.00 - 0.07 K/uL    Comment: Performed at [facility], [address] [place], [place] [address]  Resp Panel by RT-PCR (Flu A&B, Covid) Nasopharyngeal Swab     Status: None   Collection Time: 01/26/21  8:05  PM   Specimen: Nasopharyngeal Swab; Nasopharyngeal(NP) swabs in vial transport medium  Result Value Ref Range   SARS Coronavirus 2 by RT PCR NEGATIVE NEGATIVE    Comment: (NOTE) SARS-CoV-2 target nucleic acids are NOT DETECTED.  The SARS-CoV-2 RNA is generally detectable in upper respiratory specimens during the acute phase of infection. The lowest concentration of SARS-CoV-2 viral copies this assay can detect is 138 copies/mL. A negative result does not preclude SARS-Cov-2 infection and should not be used as the sole basis for treatment or other patient management decisions. A negative result may occur with  improper specimen collection/handling, submission of specimen other than nasopharyngeal swab, presence of viral mutation(s) within the areas targeted by this assay, and inadequate number of viral copies(<138 copies/mL). A negative result must be combined with clinical observations, patient history, and epidemiological information. The expected result is Negative.  Fact Sheet for Patients:  EntrepreneurPulse.com.au  Fact Sheet for Healthcare Providers:  IncredibleEmployment.be  This test is no t yet approved or cleared by the [place] FDA and  has been authorized for detection and/or diagnosis of SARS-CoV-2 by FDA under an Emergency Use Authorization (EUA). This EUA will remain  in effect (meaning this test can be used) for the duration of the COVID-19 declaration under Section 564(b)(1) of the Act, 21 U.S.C.section 360bbb-3(b)(1), unless the authorization is terminated  or revoked sooner.       Influenza A by PCR NEGATIVE NEGATIVE   Influenza B by PCR NEGATIVE NEGATIVE    Comment: (NOTE) The Xpert Xpress SARS-CoV-2/FLU/RSV plus assay is intended as an aid in the diagnosis of influenza from Nasopharyngeal swab specimens and should not be used as a sole basis for treatment. Nasal washings and aspirates are unacceptable for Xpert  Xpress SARS-CoV-2/FLU/RSV testing.  Fact Sheet for Patients: EntrepreneurPulse.com.au  Fact Sheet for Healthcare Providers: IncredibleEmployment.be  This test is not yet approved or cleared by the [place] FDA and has been authorized for detection and/or diagnosis of SARS-CoV-2 by FDA under an Emergency Use Authorization (EUA). This EUA will remain in effect (meaning this test can be used) for the duration of the COVID-19 declaration under Section 564(b)(1) of the Act, 21 U.S.C. section 360bbb-3(b)(1), unless the authorization is terminated or revoked.  Performed at [facility], [address], [place], [place] [address]     Chemistries  Recent Labs  Lab 01/26/21 1814  NA 137  K 4.1  CL 104  CO2 24  GLUCOSE 117*  BUN 19  CREATININE 1.22*  CALCIUM 9.0  AST 21  ALT 18  ALKPHOS 48  BILITOT 1.0   ------------------------------------------------------------------------------------------------------------------  ------------------------------------------------------------------------------------------------------------------ GFR:  Estimated Creatinine Clearance: 39.5 mL/min (A) (by C-G formula based on SCr of 1.22 mg/dL (H)). Liver Function Tests: Recent Labs  Lab 01/26/21 1814  AST 21  ALT 18  ALKPHOS 48  BILITOT 1.0  PROT 7.5  ALBUMIN 4.3   No results for input(s): LIPASE, AMYLASE in the last 168 hours. No results for input(s): AMMONIA in the last 168 hours. Coagulation Profile: No results for input(s): INR, PROTIME in the last 168 hours. Cardiac Enzymes: No results for input(s): CKTOTAL, CKMB, CKMBINDEX, TROPONINI in the last 168 hours. BNP (last 3 results) No results for input(s): PROBNP in the last 8760 hours. HbA1C: No results for input(s): HGBA1C in the last 72 hours. CBG: No results for input(s): GLUCAP in the last 168 hours. Lipid Profile: No results for input(s): CHOL, HDL, LDLCALC, TRIG, CHOLHDL, LDLDIRECT  in the last 72 hours. Thyroid Function Tests: No results for input(s): TSH, T4TOTAL, FREET4, T3FREE, THYROIDAB in the last 72 hours. Anemia Panel: No results for input(s): VITAMINB12, FOLATE, FERRITIN, TIBC, IRON, RETICCTPCT in the last 72 hours.  --------------------------------------------------------------------------------------------------------------- Urine analysis:    Component Value Date/Time   COLORURINE YELLOW 11/09/2020 [place] 11/09/2020 1717   LABSPEC 1.020 11/09/2020 1717   PHURINE 5.5 11/09/2020 1717   GLUCOSEU NEGATIVE 11/09/2020 1717   HGBUR NEGATIVE 11/09/2020 1717   BILIRUBINUR NEGATIVE 11/09/2020 1717   KETONESUR NEGATIVE 11/09/2020 1717   UROBILINOGEN 0.2 11/09/2020 1717   NITRITE POSITIVE (A) 11/09/2020 1717   LEUKOCYTESUR NEGATIVE 11/09/2020 1717      Imaging Results:    CT Head Wo Contrast  Result Date: 01/26/2021 CLINICAL DATA:  Delirium.  Three falls this week due to weakness. EXAM: CT HEAD WITHOUT CONTRAST TECHNIQUE: Contiguous axial images were obtained from the base of the skull through the vertex without intravenous contrast. COMPARISON:  None. FINDINGS: Brain: No evidence of acute infarction, hemorrhage, hydrocephalus, extra-axial collection or mass lesion/mass effect. Mild periventricular and deep white matter hypodensity typical of chronic small vessel ischemia. 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 Vascular: No hyperdense vessel. Skull: No fracture or focal lesion. Sinuses/Orbits: Sclerosis involving the left mandibular condyle is likely related to degenerative change at the temporomandibular joint. Paranasal sinuses are clear. No mastoid effusion. No acute orbital abnormality. Other: None. IMPRESSION: 1. No acute intracranial abnormality. 2. Mild chronic small vessel ischemia. 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  Electronically Signed   By: Keith Rake M.D.   On: 01/26/2021 18:59       Assessment & Plan:    Active Problems:   Ambulatory dysfunction   1. Ambulatory dysfunction 1. Very subtle left upper and lower extremity weakness 2. Stroke work-up including MRI brain, echo, PT/OT/ST in the a.m. 3. Monitor on telemetry 2. B12 deficiency 1. History of B12 deficiency 2. Check B12 3. Continue B12 supplementation 3. Vitamin D deficiency 1. History of vitamin D deficiency 2. Check vitamin D 4. Tobacco use disorder 1. Advised on the importance of cessation 5. Thyroid disease 1. Continue Synthroid 2. TSH in a.m. 6. Hypertension 1. Continue home antihypertensives 7. Psychiatric disorder 1. Holding Adderall 2. Continue Paxil and Risperdal 3. Xanax only as needed   DVT Prophylaxis-   Heparin- SCDs AM Labs Ordered, also please review Full Orders  Family Communication: No family at bedside Code Status: Full  Admission status: ObservationTime spent in minutes :[place]

## 2021-01-26 NOTE — Telephone Encounter (Signed)
   Spouse calling to report on 4/25 patient seemed weak, she was unable to stand on her own, episodes of urinary incontinence overnight. Spouse even reports he had to feed the patient. No complaint of pain, only weakness. EMS was not called, no visit to ED Spouse is not at home currently to report condition of patient, states he will call back once he gets home  Appointment made for 4/27, however call also transferred to [facility] for immediate triage

## 2021-01-26 NOTE — Telephone Encounter (Signed)
[facility] advised to call EMS 911 now. Patient understood and agreed.

## 2021-01-27 ENCOUNTER — Observation Stay (HOSPITAL_COMMUNITY): Payer: Medicare HMO

## 2021-01-27 ENCOUNTER — Inpatient Hospital Stay (HOSPITAL_COMMUNITY): Payer: Medicare HMO

## 2021-01-27 ENCOUNTER — Ambulatory Visit: Payer: PPO | Admitting: Internal Medicine

## 2021-01-27 DIAGNOSIS — R471 Dysarthria and anarthria: Secondary | ICD-10-CM | POA: Diagnosis present

## 2021-01-27 DIAGNOSIS — Z818 Family history of other mental and behavioral disorders: Secondary | ICD-10-CM | POA: Diagnosis not present

## 2021-01-27 DIAGNOSIS — E785 Hyperlipidemia, unspecified: Secondary | ICD-10-CM | POA: Diagnosis present

## 2021-01-27 DIAGNOSIS — Z888 Allergy status to other drugs, medicaments and biological substances status: Secondary | ICD-10-CM | POA: Diagnosis not present

## 2021-01-27 DIAGNOSIS — I672 Cerebral atherosclerosis: Secondary | ICD-10-CM | POA: Diagnosis not present

## 2021-01-27 DIAGNOSIS — R29898 Other symptoms and signs involving the musculoskeletal system: Secondary | ICD-10-CM | POA: Diagnosis not present

## 2021-01-27 DIAGNOSIS — I63533 Cerebral infarction due to unspecified occlusion or stenosis of bilateral posterior cerebral arteries: Secondary | ICD-10-CM | POA: Diagnosis not present

## 2021-01-27 DIAGNOSIS — Z8616 Personal history of COVID-19: Secondary | ICD-10-CM | POA: Diagnosis not present

## 2021-01-27 DIAGNOSIS — E538 Deficiency of other specified B group vitamins: Secondary | ICD-10-CM | POA: Diagnosis present

## 2021-01-27 DIAGNOSIS — R531 Weakness: Secondary | ICD-10-CM

## 2021-01-27 DIAGNOSIS — F99 Mental disorder, not otherwise specified: Secondary | ICD-10-CM | POA: Diagnosis present

## 2021-01-27 DIAGNOSIS — Z887 Allergy status to serum and vaccine status: Secondary | ICD-10-CM | POA: Diagnosis not present

## 2021-01-27 DIAGNOSIS — I63521 Cerebral infarction due to unspecified occlusion or stenosis of right anterior cerebral artery: Secondary | ICD-10-CM | POA: Diagnosis present

## 2021-01-27 DIAGNOSIS — E039 Hypothyroidism, unspecified: Secondary | ICD-10-CM | POA: Diagnosis present

## 2021-01-27 DIAGNOSIS — I671 Cerebral aneurysm, nonruptured: Secondary | ICD-10-CM | POA: Diagnosis not present

## 2021-01-27 DIAGNOSIS — E559 Vitamin D deficiency, unspecified: Secondary | ICD-10-CM | POA: Diagnosis present

## 2021-01-27 DIAGNOSIS — Z79890 Hormone replacement therapy (postmenopausal): Secondary | ICD-10-CM | POA: Diagnosis not present

## 2021-01-27 DIAGNOSIS — F17210 Nicotine dependence, cigarettes, uncomplicated: Secondary | ICD-10-CM | POA: Diagnosis present

## 2021-01-27 DIAGNOSIS — Z720 Tobacco use: Secondary | ICD-10-CM | POA: Diagnosis not present

## 2021-01-27 DIAGNOSIS — I639 Cerebral infarction, unspecified: Secondary | ICD-10-CM | POA: Diagnosis present

## 2021-01-27 DIAGNOSIS — Z8249 Family history of ischemic heart disease and other diseases of the circulatory system: Secondary | ICD-10-CM | POA: Diagnosis not present

## 2021-01-27 DIAGNOSIS — M48061 Spinal stenosis, lumbar region without neurogenic claudication: Secondary | ICD-10-CM | POA: Diagnosis present

## 2021-01-27 DIAGNOSIS — I129 Hypertensive chronic kidney disease with stage 1 through stage 4 chronic kidney disease, or unspecified chronic kidney disease: Secondary | ICD-10-CM | POA: Diagnosis present

## 2021-01-27 DIAGNOSIS — Z0289 Encounter for other administrative examinations: Secondary | ICD-10-CM

## 2021-01-27 DIAGNOSIS — G8194 Hemiplegia, unspecified affecting left nondominant side: Secondary | ICD-10-CM | POA: Diagnosis present

## 2021-01-27 DIAGNOSIS — N1831 Chronic kidney disease, stage 3a: Secondary | ICD-10-CM | POA: Diagnosis present

## 2021-01-27 DIAGNOSIS — Z79899 Other long term (current) drug therapy: Secondary | ICD-10-CM | POA: Diagnosis not present

## 2021-01-27 DIAGNOSIS — I6523 Occlusion and stenosis of bilateral carotid arteries: Secondary | ICD-10-CM | POA: Diagnosis not present

## 2021-01-27 DIAGNOSIS — E782 Mixed hyperlipidemia: Secondary | ICD-10-CM | POA: Diagnosis not present

## 2021-01-27 DIAGNOSIS — Z20822 Contact with and (suspected) exposure to covid-19: Secondary | ICD-10-CM | POA: Diagnosis present

## 2021-01-27 DIAGNOSIS — I6389 Other cerebral infarction: Secondary | ICD-10-CM | POA: Diagnosis not present

## 2021-01-27 DIAGNOSIS — R4182 Altered mental status, unspecified: Secondary | ICD-10-CM | POA: Diagnosis present

## 2021-01-27 DIAGNOSIS — I10 Essential (primary) hypertension: Secondary | ICD-10-CM | POA: Diagnosis not present

## 2021-01-27 DIAGNOSIS — G319 Degenerative disease of nervous system, unspecified: Secondary | ICD-10-CM | POA: Diagnosis not present

## 2021-01-27 DIAGNOSIS — R29706 NIHSS score 6: Secondary | ICD-10-CM | POA: Diagnosis present

## 2021-01-27 DIAGNOSIS — R262 Difficulty in walking, not elsewhere classified: Secondary | ICD-10-CM | POA: Diagnosis not present

## 2021-01-27 DIAGNOSIS — F419 Anxiety disorder, unspecified: Secondary | ICD-10-CM | POA: Diagnosis present

## 2021-01-27 DIAGNOSIS — R2689 Other abnormalities of gait and mobility: Secondary | ICD-10-CM | POA: Diagnosis not present

## 2021-01-27 DIAGNOSIS — R296 Repeated falls: Secondary | ICD-10-CM | POA: Diagnosis not present

## 2021-01-27 DIAGNOSIS — Z28311 Partially vaccinated for covid-19: Secondary | ICD-10-CM | POA: Diagnosis not present

## 2021-01-27 LAB — LIPID PANEL
Cholesterol: 184 mg/dL (ref 0–200)
HDL: 56 mg/dL (ref >40)
LDL Cholesterol: 104 mg/dL — ABNORMAL HIGH (ref 0–99)
Total CHOL/HDL Ratio: 3.3 RATIO
Triglycerides: 121 mg/dL (ref <150)
VLDL: 24 mg/dL (ref 0–40)

## 2021-01-27 LAB — HEMOGLOBIN A1C
Hgb A1c MFr Bld: 6.4 % — ABNORMAL HIGH (ref 4.8–5.6)
Mean Plasma Glucose: 136.98 mg/dL

## 2021-01-27 LAB — TSH: TSH: 26.362 u[IU]/mL — ABNORMAL HIGH (ref 0.350–4.500)

## 2021-01-27 LAB — VITAMIN B12: Vitamin B-12: 509 pg/mL (ref 180–914)

## 2021-01-27 LAB — VITAMIN D 25 HYDROXY (VIT D DEFICIENCY, FRACTURES): Vit D, 25-Hydroxy: 21.7 ng/mL — ABNORMAL LOW (ref 30–100)

## 2021-01-27 MED ORDER — ASPIRIN 325 MG PO TABS
325.0000 mg | ORAL_TABLET | Freq: Every day | ORAL | Status: DC
  Administered 2021-01-27 – 2021-01-28 (×2): 325 mg via ORAL
  Filled 2021-01-27 (×2): qty 1

## 2021-01-27 MED ORDER — GADOBUTROL 1 MMOL/ML IV SOLN
6.5000 mL | Freq: Once | INTRAVENOUS | Status: AC | PRN
  Administered 2021-01-27: 6.5 mL via INTRAVENOUS

## 2021-01-27 MED ORDER — IOHEXOL 350 MG/ML SOLN
75.0000 mL | Freq: Once | INTRAVENOUS | Status: AC | PRN
  Administered 2021-01-27: 75 mL via INTRAVENOUS

## 2021-01-27 MED ORDER — PRAVASTATIN SODIUM 40 MG PO TABS
40.0000 mg | ORAL_TABLET | Freq: Every day | ORAL | Status: DC
  Administered 2021-01-27 – 2021-01-29 (×3): 40 mg via ORAL
  Filled 2021-01-27 (×3): qty 1

## 2021-01-27 NOTE — Progress Notes (Signed)
PROGRESS NOTE    Patient: Susan Davidson                            PCP: Biagio Borg, MD                    DOB: [birth year]            DOA: 01/26/2021 [id number]             DOS: 01/27/2021, 8:05 AM   LOS: 0 days   Date of Service: The patient was seen and examined on 01/27/2021  Subjective:   The patient was seen and examined this morning. Hemodynamically stable, no issues overnight Still complaining of weakness, unable to ambulate Otherwise no issues overnight .  Brief Narrative:   Susan Davidson  is a 70 y.o. female, with history of vitamin D deficiency, vitamin B12 deficiency, hypothyroidism, hypertension, hyperlipidemia, psych disorder, and more presents to ED with a chief complaint of severe generalized weakness, unable to ambulate acutely. Sever left sided weakness ... Per husband her symptoms started on Monday.  Patient is a very poor historian but able to communicate.  Stating she started feeling weak and unable to walk for past few days.  She also had a fall she does not remember the details.  On admission she was somewhat delirious stating that he might of been due to her COVID shot that she received in January 2021   Patient does smoke half a pack per day.  She drinks 1 shot of rum every night.  She does not use illicit drugs.  She was vaccinated x1 for COVID.  Patient is full code.   In the ED Temp 97.9, heart rate 86-1 40, respiratory rate 20, blood pressure 130/105, satting at 98% on room air CT head shows no acute intracranial abnormalities.  Mild chronic small vessel ischemia.  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 EKG shows a heart rate of 86, QTc 488, sinus rhythm No leukocytosis with a white blood cell count 10.9, hemoglobin 14.5, platelets 200 Chemistry panel was mostly unremarkable with a creatinine that is at her baseline of 1.22 CT lumbar was  pending    Assessment & Plan:   Active Problems:    Hypothyroidism   Vitamin D deficiency   Essential hypertension   B12 deficiency   Ambulatory dysfunction   Tobacco use disorder   1. Acute CVA - with ambulatory dysfunction 1. Very subtle left upper and lower extremity weakness 2. CT of the head revealed chronic microvascular disease, and remote  appearing lacunar infarcts in the right caudate, deep basal ganglia, and right cerebellum. 3. We will continue with stroke work-up, , 4. Stroke work-up including MRI brain, echo, PT/OT/ST in the a.m. 5. Monitor on telemetry 6. Carotid ultrasound revealed 50% stenosis bilaterally, with possible plaque on the left internal carotid arteries, recommended CT angiogram, will proceed with MRI of head and neck 7. Continue high-dose statins, aspirin 8. LDL 104, HDL 56, total cholesterol 184 9. Consulted neurology  2. B12 deficiency 1. Continue B12 supplementation 2. B12 normal at 509  3. Vitamin D deficiency 1. History of vitamin D deficiency 2. Check vitamin D  4. Tobacco use disorder 1. Advised on the importance of cessation  5. Thyroid disease 1. Continue Synthroid 2. TSH in a.m.  6. Hypertension 1. Continue home antihypertensives  7. Psychiatric disorder 1. Holding Adderall 2. Continue Paxil  and Risperdal 3. Xanax only as needed      --------------------------------------------------------------------------------------------------------------------------------------------- Nutritional status:  The patient's BMI is: Body mass index is 24.89 kg/m. I agree with the assessment and plan as outlined  Skin Assessment: I have examined the patient's skin and I agree with the wound assessment as performed by wound care team As outlined belowe:   --------------------------------------------------------------------------------------------------------------------------------------------- Cultures; None   Antimicrobials: None    Consultants:  Neurology  ------------------------------------------------------------------------------------------------------------------------------------------------  DVT prophylaxis:  SCD/Compression stockings Code Status:   Code Status: Full Code  Family Communication: Cussed with her husband over the phone in detail 01/27/21 The above findings and plan of care has been discussed with patient (and family)  in detail,  they expressed understanding and agreement of above. -Advance care planning has been discussed.   Admission status:   Status is: Observation  The patient remains OBS appropriate and will d/c before 2 midnights.  Dispo: The patient is from: Home              Anticipated d/c is to: SNF  VS Home with Home health               Patient currently is not medically stable to d/c.   Difficult to place patient No      Level of care: Telemetry   Procedures:   No admission procedures for hospital encounter.     Antimicrobials:  Anti-infectives (From admission, onward)   None       Medication:  .  stroke: mapping our early stages of recovery book   Does not apply Once  . heparin  5,000 Units Subcutaneous Q8H  . levothyroxine  50 mcg Oral Q0600  . lisinopril  10 mg Oral Daily  . PARoxetine  20 mg Oral Daily  . pravastatin  20 mg Oral q1800  . risperiDONE  1 mg Oral BID  . vitamin B-12  1,000 mcg Oral Daily    acetaminophen **OR** acetaminophen (TYLENOL) oral liquid 160 mg/5 mL **OR** acetaminophen, ALPRAZolam, ondansetron (ZOFRAN) IV   Objective:   Vitals:   01/27/21 0130 01/27/21 0310 01/27/21 0530 01/27/21 0731  BP: 100/60 105/62 107/65 138/76  Pulse: 86 84 82 84  Resp: '18 19 16 16  '$ Temp: 97.9 F (36.6 C) 98.3 F (36.8 C) 97.9 F (36.6 C) 98.9 F (37.2 C)  TempSrc: Oral  Oral Oral  SpO2: 92% 92% 94% 96%  Weight:      Height:        Intake/Output Summary (Last 24 hours) at 01/27/2021 0805 Last data filed at 01/27/2021 0700 Gross per 24 hour  Intake  --  Output 500 ml  Net -500 ml   Filed Weights   01/26/21 1723  Weight: 65.8 kg     Examination:   Physical Exam  Constitution: Speech slow, alert, cooperative, no distress,  Appears calm and comfortable  Psychiatric: Cognition intact normal and stable mood and affect, cognition intact,   HEENT: Normocephalic, PERRL, otherwise with in Normal limits  Chest:Chest symmetric Cardio vascular:  S1/S2, RRR, No murmure, No Rubs or Gallops  pulmonary: Clear to auscultation bilaterally, respirations unlabored, negative wheezes / crackles Abdomen: Soft, non-tender, non-distended, bowel sounds,no masses, no organomegaly Muscular skeletal:  Dense left upper and lower extremity weakness, sensation intact Strength intact in right upper and lower extremities Neuro: Left-sided weakness, negative for any paresthesia, sensation intact bilateral  CNII-XII intact. , Extremities: No pitting edema lower extremities, +2 pulses  Skin: Dry, warm to touch, negative for any Rashes,  No open wounds Wounds: per nursing documentation    ------------------------------------------------------------------------------------------------------------------------------------------    LABs:  CBC Latest Ref Rng & Units 01/26/2021 11/09/2020 03/11/2020  WBC 4.0 - 10.5 K/uL 10.9(H) 7.5 6.2  Hemoglobin 12.0 - 15.0 g/dL 14.5 13.8 15.6(H)  Hematocrit 36.0 - 46.0 % 45.5 41.8 47.2(H)  Platelets 150 - 400 K/uL 200 165.0 235.0   CMP Latest Ref Rng & Units 01/26/2021 11/09/2020 03/11/2020  Glucose 70 - 99 mg/dL 117(H) 100(H) 120(H)  BUN 8 - 23 mg/dL '19 20 16  '$ Creatinine 0.44 - 1.00 mg/dL 1.22(H) 1.39(H) 1.26(H)  Sodium 135 - 145 mmol/L 137 139 137  Potassium 3.5 - 5.1 mmol/L 4.1 4.8 4.0  Chloride 98 - 111 mmol/L 104 99 96  CO2 22 - 32 mmol/L 24 32 30  Calcium 8.9 - 10.3 mg/dL 9.0 10.0 9.6  Total Protein 6.5 - 8.1 g/dL 7.5 7.7 7.8  Total Bilirubin 0.3 - 1.2 mg/dL 1.0 0.4 0.6  Alkaline Phos 38 - 126 U/L 48 42 59  AST 15 - 41 U/L  '21 20 22  '$ ALT 0 - 44 U/L '18 14 12       '$ Micro Results Recent Results (from the past 240 hour(s))  Resp Panel by RT-PCR (Flu A&B, Covid) Nasopharyngeal Swab     Status: None   Collection Time: 01/26/21  8:05 PM   Specimen: Nasopharyngeal Swab; Nasopharyngeal(NP) swabs in vial transport medium  Result Value Ref Range Status   SARS Coronavirus 2 by RT PCR NEGATIVE NEGATIVE Final    Comment: (NOTE) SARS-CoV-2 target nucleic acids are NOT DETECTED.  The SARS-CoV-2 RNA is generally detectable in upper respiratory specimens during the acute phase of infection. The lowest concentration of SARS-CoV-2 viral copies this assay can detect is 138 copies/mL. A negative result does not preclude SARS-Cov-2 infection and should not be used as the sole basis for treatment or other patient management decisions. A negative result may occur with  improper specimen collection/handling, submission of specimen other than nasopharyngeal swab, presence of viral mutation(s) within the areas targeted by this assay, and inadequate number of viral copies(<138 copies/mL). A negative result must be combined with clinical observations, patient history, and epidemiological information. The expected result is Negative.  Fact Sheet for Patients:  EntrepreneurPulse.com.au  Fact Sheet for Healthcare Providers:  IncredibleEmployment.be  This test is no t yet approved or cleared by the [place] FDA and  has been authorized for detection and/or diagnosis of SARS-CoV-2 by FDA under an Emergency Use Authorization (EUA). This EUA will remain  in effect (meaning this test can be used) for the duration of the COVID-19 declaration under Section 564(b)(1) of the Act, 21 U.S.C.section 360bbb-3(b)(1), unless the authorization is terminated  or revoked sooner.       Influenza A by PCR NEGATIVE NEGATIVE Final   Influenza B by PCR NEGATIVE NEGATIVE Final    Comment: (NOTE) The  Xpert Xpress SARS-CoV-2/FLU/RSV plus assay is intended as an aid in the diagnosis of influenza from Nasopharyngeal swab specimens and should not be used as a sole basis for treatment. Nasal washings and aspirates are unacceptable for Xpert Xpress SARS-CoV-2/FLU/RSV testing.  Fact Sheet for Patients: EntrepreneurPulse.com.au  Fact Sheet for Healthcare Providers: IncredibleEmployment.be  This test is not yet approved or cleared by the [place] FDA and has been authorized for detection and/or diagnosis of SARS-CoV-2 by FDA under an Emergency Use Authorization (EUA). This EUA will remain in effect (meaning this test can be used) for the duration of the  COVID-19 declaration under Section 564(b)(1) of the Act, 21 U.S.C. section 360bbb-3(b)(1), unless the authorization is terminated or revoked.  Performed at [facility], [address] [place], [place] [address]     Radiology Reports CT Head Wo Contrast  Result Date: 01/26/2021 CLINICAL DATA:  Delirium.  Three falls this week due to weakness. EXAM: CT HEAD WITHOUT CONTRAST TECHNIQUE: Contiguous axial images were obtained from the base of the skull through the vertex without intravenous contrast. COMPARISON:  None. FINDINGS: Brain: No evidence of acute infarction, hemorrhage, hydrocephalus, extra-axial collection or mass lesion/mass effect. Mild periventricular and deep white matter hypodensity typical of chronic small vessel ischemia. 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 Vascular: No hyperdense vessel. Skull: No fracture or focal lesion. Sinuses/Orbits: Sclerosis involving the left mandibular condyle is likely related to degenerative change at the temporomandibular joint. Paranasal sinuses are clear. No mastoid effusion. No acute orbital abnormality. Other: None. IMPRESSION: 1. No acute intracranial abnormality. 2. Mild chronic small vessel  ischemia. 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 Electronically Signed   By: Keith Rake M.D.   On: 01/26/2021 18:59   CT Lumbar Spine Wo Contrast  Result Date: 01/26/2021 CLINICAL DATA:  Initial evaluation for acute weakness, inability to walk. EXAM: CT LUMBAR SPINE WITHOUT CONTRAST TECHNIQUE: Multidetector CT imaging of the lumbar spine was performed without intravenous contrast administration. Multiplanar CT image reconstructions were also generated. COMPARISON:  None available. FINDINGS: Segmentation: Standard. Lowest well-formed disc space labeled the L5-S1 level. Alignment: Mild levoscoliosis. 3 mm retrolisthesis of L1 on L2, with 8 mm anterolisthesis of L4 on L5. Findings chronic and facet mediated. No pars defect. Vertebrae: Vertebral body height maintained without acute or chronic fracture. Visualized sacrum and pelvis intact. SI joints approximated symmetric. No discrete or worrisome osseous lesions. Paraspinal and other soft tissues: Paraspinous soft tissues demonstrate no acute finding. Moderate aorto bi-iliac atherosclerotic disease. Visualized visceral structures otherwise unremarkable. Disc levels: L1-2: 3 mm retrolisthesis. Mild annular disc bulge with bilateral facet hypertrophy. No significant spinal stenosis. Mild to moderate bilateral L1 foraminal narrowing. L2-3: Minimal annular disc bulge. Moderate right worse than left facet hypertrophy. No significant spinal stenosis. Mild to moderate bilateral L2 foraminal stenosis. L3-4: Mild disc bulge, slightly eccentric to the right. Severe bilateral facet arthrosis. Resultant mild canal with bilateral subarticular stenosis. Moderate bilateral L3 foraminal narrowing. L4-5: 8 mm anterolisthesis. Associated broad posterior pseudo disc bulge/uncovering, eccentric to the right. Severe bilateral facet arthrosis with ligament flavum hypertrophy. Resultant severe canal with bilateral  subarticular stenosis. Moderate to severe bilateral L4 foraminal narrowing, left worse than right. L5-S1: Minimal disc bulge. Moderate left worse than right facet hypertrophy. No spinal stenosis. Foramina remain patent. IMPRESSION: 1. No acute abnormality within the lumbar spine. 2. 8 mm anterolisthesis of L4 on L5 with resultant severe spinal stenosis, with moderate to severe bilateral L4 foraminal narrowing, left worse than right. 3. Additional multifactorial degenerative changes at L1-2, L2-3, L3-4, and L5-S1 with resultant mild to moderate bilateral L1, L2, and L3 foraminal stenosis as above. 4. Aortic Atherosclerosis (ICD10-I70.0). Electronically Signed   By: Jeannine Boga M.D.   On: 01/26/2021 21:16    SIGNED: Deatra James, MD, FHM. [facility],  Pager (please use [email or web address] to page/text) Please use Epic Secure Chat for non-urgent communication (7AM-7PM)  If 7PM-7AM, please contact night-coverage [email or web address], 01/27/2021, 8:05 AM

## 2021-01-27 NOTE — Evaluation (Signed)
Occupational Therapy Evaluation Patient Details Name: Susan Davidson MRN: [id number] DOB: Dec 25, 1950 Today's Date: 01/27/2021    History of Present Illness Shloka Riff  is a 70 y.o. female, with history of vitamin D deficiency, vitamin B12 deficiency, hypothyroidism, hypertension, hyperlipidemia, psych disorder, and more presents to ED with a chief complaint.  Patient is a notably poor historian.  She reports that she had sudden onset of ambulatory dysfunction today.  She reports her last known well was yesterday.  She is not able to give more specific times than that.  She reports that when she tries to walk she falls to the left.  She reports that this is never happened before, but that she has had trouble with falling to the right in the past.  She reports that her trouble falling to the right started after she took the first COVID shot, and she thinks that this is related to that as well.  She did the first COVID shot in January 2021.  She denies any numbness or injury to her back or legs.  She reports that she did fall to the ground and bumped her head when she first noted that she could not walk.  She did not have loss of consciousness.  She reports that she landed flat on her left side, but did not hit her hip or her knee.  She has no pain from the fall.  Patient reports that she has had decreased appetite today.  No dysphagia, no difficulty talking.  She reports no change in vision, and possible 2 days of muffled hearing.  She reports that she does have allergies.  Her last normal bowel movement was yesterday.  She has no other complaints at this time.   Clinical Impression   Pt agreeable to OT/PT co-evaluation this date. Pt presents with flat affect but able to answer all history questions. Min difficulty following one step commands during functional transfers. Mod to max A required for supine to sit bed mobility with poor sitting balance via L side lateral lean. Pt required Max A for stand  pivot transfer from EOB to chair. Pt presents with minimal A/ROM in L UE for shoulder ROM and wrist extension. Pt demonstrates 3-/5 MMT for elbow flexion with mild tone noted in P/ROM into flexion. No trace movements noted with wrist flexion against gravity. Pt will benefit from continued OT in the hospital setting and recommended venue below to increase strength, balance, ROM, and endurance for safe ADL's.     Follow Up Recommendations  SNF;Supervision/Assistance - 24 hour    Equipment Recommendations  None recommended by OT           Precautions / Restrictions Precautions Precautions: Fall Restrictions Weight Bearing Restrictions: No      Mobility Bed Mobility Overal bed mobility: Needs Assistance Bed Mobility: Supine to Sit     Supine to sit: Mod assist;Max assist     General bed mobility comments: Assist needed move LE and lift trunk.    Transfers Overall transfer level: Needs assistance Equipment used: Rolling walker (2 wheeled) Transfers: Sit to/from [facility] Sit to Stand: Max assist Stand pivot transfers: Max assist       General transfer comment: Attemted with RW but adjusted to PT anterior providing support.    Balance Overall balance assessment: Needs assistance Sitting-balance support: Feet supported Sitting balance-Leahy Scale: Poor Sitting balance - Comments: EOB Postural control: Left lateral lean Standing balance support: During functional activity;Bilateral upper extremity supported Standing balance-Leahy Scale:  Poor Standing balance comment: With RW and with UE/torso support from therapist.                           ADL either performed or assessed with clinical judgement   ADL Overall ADL's : Needs assistance/impaired                     Lower Body Dressing: Total assistance;Bed level   Toilet Transfer: Maximal assistance;Stand-pivot Toilet Transfer Details (indicate cue type and reason): simulated via  EOB to chair transfer                 Vision Baseline Vision/History: Wears glasses Wears Glasses:  (Near sighted. Pt does not wear glasses at all times.) Patient Visual Report: No change from baseline Vision Assessment?: Yes Eye Alignment: Within Functional Limits Tracking/Visual Pursuits: Decreased smoothness of eye movement to LEFT superior field Convergence: Impaired (comment) (Able to converge but unable to maintain convergence completely with R lateral eye drift noted.) Visual Fields: No apparent deficits                Pertinent Vitals/Pain Pain Assessment: No/denies pain     Hand Dominance Right   Extremity/Trunk Assessment Upper Extremity Assessment Upper Extremity Assessment: RUE deficits/detail;LUE deficits/detail RUE Deficits / Details: 3+/5 MMT grossly; mild limitation in R shoulder A/ROM. RUE Sensation: WNL LUE Deficits / Details: Mild tone with P/ROM into elbow flexion. Less then ~20* shoulder A/ROM seated at EOB. No A/ROM wrist flexion against gravity. 3-/5 MMT elbow flexion/extension. 2+/5 grip strength. LUE Sensation: WNL LUE Coordination: decreased fine motor;decreased gross motor   Lower Extremity Assessment Lower Extremity Assessment: Defer to PT evaluation   Cervical / Trunk Assessment Cervical / Trunk Assessment: Kyphotic (Possible kyphotic; anterior lean)   Communication Communication Communication: Other (comment) (Slow somewhat slurred speech.)   Cognition Arousal/Alertness: Awake/alert Behavior During Therapy: Flat affect Overall Cognitive Status: Within Functional Limits for tasks assessed                                                      Home Living Family/patient expects to be discharged to:: Private residence Living Arrangements: Spouse/significant other Available Help at Discharge: Family;Available PRN/intermittently Type of Home: Mobile home Home Access: Level entry     Home Layout: One level      Bathroom Shower/Tub: Tub/shower unit;Walk-in shower;Other (comment) (Pt reports taking sponge baths in tub.)   Bathroom Toilet: Standard     Home Equipment: Walker - 2 wheels;Cane - single point;Other (comment) (walk in shower grab bars)          Prior Functioning/Environment Level of Independence: Needs assistance  Gait / Transfers Assistance Needed: household ambulator with RW and cane; pt reports occasional driving ADL's / Homemaking Assistance Needed: Prior to a few weeks ago pt was able to bath and dress herself independently. She has needed assist recently. Pt's husband completes IADL's.            OT Problem List: Decreased strength;Decreased range of motion;Decreased activity tolerance;Impaired balance (sitting and/or standing);Impaired vision/perception;Decreased coordination;Decreased safety awareness;Impaired UE functional use      OT Treatment/Interventions: Self-care/ADL training;Therapeutic exercise;Neuromuscular education;DME and/or AE instruction;Therapeutic activities;Visual/perceptual remediation/compensation;Patient/family education;Balance training    OT Goals(Current goals can be found in the care plan section) Acute Rehab OT  Goals Patient Stated Goal: return home OT Goal Formulation: With patient Time For Goal Achievement: 02/10/21 Potential to Achieve Goals: Fair  OT Frequency: Min 2X/week               Co-evaluation PT/OT/SLP Co-Evaluation/Treatment: Yes Reason for Co-Treatment: To address functional/ADL transfers;Complexity of the patient's impairments (multi-system involvement)   OT goals addressed during session: ADL's and self-care;Strengthening/ROM      AM-PAC OT "6 Clicks" Daily Activity     Outcome Measure Help from another person eating meals?: A Little Help from another person taking care of personal grooming?: A Lot Help from another person toileting, which includes using toliet, bedpan, or urinal?: Total Help from another person  bathing (including washing, rinsing, drying)?: A Lot Help from another person to put on and taking off regular upper body clothing?: A Lot Help from another person to put on and taking off regular lower body clothing?: Total 6 Click Score: 11   End of Session Equipment Utilized During Treatment: Rolling walker  Activity Tolerance: Patient tolerated treatment well Patient left: in chair;with chair alarm set;with call bell/phone within reach  OT Visit Diagnosis: Unsteadiness on feet (R26.81);Repeated falls (R29.6);Other abnormalities of gait and mobility (R26.89);Muscle weakness (generalized) (M62.81);History of falling (Z91.81);Other symptoms and signs involving the nervous system (R29.898);Hemiplegia and hemiparesis Hemiplegia - Right/Left: Left (Possible  L side hemiplegia) Hemiplegia - dominant/non-dominant: Non-Dominant Hemiplegia - caused by:  (Possible stroke; MRI pending)                Time: [id number] OT Time Calculation (min): 25 min Charges:  OT General Charges $OT Visit: 1 Visit OT Evaluation $OT Eval Moderate Complexity: 1 Mod  Arleta Ostrum OT, MOT   Larey Seat 01/27/2021, 10:37 AM

## 2021-01-27 NOTE — Evaluation (Signed)
Physical Therapy Evaluation Patient Details Name: Susan Davidson MRN: [id number] DOB: Oct 14, 1950 Today's Date: 01/27/2021   History of Present Illness  Susan Davidson  is a 70 y.o. female, with history of vitamin D deficiency, vitamin B12 deficiency, hypothyroidism, hypertension, hyperlipidemia, psych disorder, and more presents to ED with a chief complaint.  Patient is a notably poor historian.  She reports that she had sudden onset of ambulatory dysfunction today.  She reports her last known well was yesterday.  She is not able to give more specific times than that.  She reports that when she tries to walk she falls to the left.  She reports that this is never happened before, but that she has had trouble with falling to the right in the past.  She reports that her trouble falling to the right started after she took the first COVID shot, and she thinks that this is related to that as well.  She did the first COVID shot in January 2021.  She denies any numbness or injury to her back or legs.  She reports that she did fall to the ground and bumped her head when she first noted that she could not walk.  She did not have loss of consciousness.  She reports that she landed flat on her left side, but did not hit her hip or her knee.  She has no pain from the fall.  Patient reports that she has had decreased appetite today.  No dysphagia, no difficulty talking.  She reports no change in vision, and possible 2 days of muffled hearing.  She reports that she does have allergies.  Her last normal bowel movement was yesterday.  She has no other complaints at this time.    Clinical Impression  Patient presents with left side weakness requiring Max assist to sit up at bedside, very poor standing balance and at high risk for falls.  Patient unable to take side steps or use RW to transfer to chair due to fall risk and weakness.  Patient required Max assist to stand pivot to chair with knees blocked.  Patient will benefit  from continued physical therapy in hospital and recommended venue below to increase strength, balance, endurance for safe ADLs and gait.    Follow Up Recommendations SNF    Equipment Recommendations       Recommendations for Other Services       Precautions / Restrictions Precautions Precautions: Fall Restrictions Weight Bearing Restrictions: No      Mobility  Bed Mobility Overal bed mobility: Needs Assistance Bed Mobility: Supine to Sit     Supine to sit: Mod assist;Max assist     General bed mobility comments: slow labored movement with less sided weakness    Transfers Overall transfer level: Needs assistance Equipment used: Rolling walker (2 wheeled);1 person hand held assist Transfers: Sit to/from [employer] Transfers Sit to Stand: Max assist Stand pivot transfers: Max assist       General transfer comment: Patient unable to transfer using RW due weakness, required Max assist stand pivot with knees blocked  Ambulation/Gait                Stairs            Wheelchair Mobility    Modified Rankin (Stroke Patients Only)       Balance Overall balance assessment: Needs assistance Sitting-balance support: Feet supported;No upper extremity supported Sitting balance-Leahy Scale: Poor Sitting balance - Comments: EOB Postural control: Left lateral lean Standing  balance support: During functional activity;Bilateral upper extremity supported Standing balance-Leahy Scale: Poor Standing balance comment: With RW and with UE/torso support from therapist.                             Pertinent Vitals/Pain Pain Assessment: No/denies pain    Home Living Family/patient expects to be discharged to:: Private residence Living Arrangements: Spouse/significant other Available Help at Discharge: Family;Available PRN/intermittently Type of Home: Mobile home Home Access: Level entry     Home Layout: One level Home Equipment: [place] - 2  wheels;Cane - single point;Other (comment)      Prior Function Level of Independence: Needs assistance   Gait / Transfers Assistance Needed: household ambulator with RW and cane; pt reports occasional driving  ADL's / Homemaking Assistance Needed: Prior to a few weeks ago pt was able to bath and dress herself independently. She has needed assist recently. Pt's husband completes IADL's.        Hand Dominance   Dominant Hand: Right    Extremity/Trunk Assessment   Upper Extremity Assessment Upper Extremity Assessment: Defer to OT evaluation    Lower Extremity Assessment Lower Extremity Assessment: Generalized weakness;RLE deficits/detail;LLE deficits/detail RLE Deficits / Details: grossly 3+/5 RLE Sensation: WNL RLE Coordination: WNL LLE Deficits / Details: grossly 2/5, unable to actively dorsiflex left ankle, 0-1/5 LLE Sensation: decreased light touch LLE Coordination: decreased fine motor    Cervical / Trunk Assessment Cervical / Trunk Assessment: Kyphotic  Communication      Cognition Arousal/Alertness: Awake/alert Behavior During Therapy: Flat affect Overall Cognitive Status: No family/caregiver present to determine baseline cognitive functioning                                        General Comments      Exercises     Assessment/Plan    PT Assessment Patient needs continued PT services  PT Problem List Decreased strength;Decreased activity tolerance;Decreased balance;Decreased mobility       PT Treatment Interventions DME instruction;Gait training;Stair training;Functional mobility training;Therapeutic activities;Therapeutic exercise;Patient/family education;Balance training    PT Goals (Current goals can be found in the Care Plan section)  Acute Rehab PT Goals Patient Stated Goal: return home PT Goal Formulation: With patient Time For Goal Achievement: 02/10/21 Potential to Achieve Goals: Good    Frequency Min 3X/week   Barriers  to discharge        Co-evaluation PT/OT/SLP Co-Evaluation/Treatment: Yes Reason for Co-Treatment: Complexity of the patient's impairments (multi-system involvement);For patient/therapist safety;To address functional/ADL transfers PT goals addressed during session: Mobility/safety with mobility;Balance;Proper use of DME         AM-PAC PT "6 Clicks" Mobility  Outcome Measure Help needed turning from your back to your side while in a flat bed without using bedrails?: A Lot Help needed moving from lying on your back to sitting on the side of a flat bed without using bedrails?: A Lot Help needed moving to and from a bed to a chair (including a wheelchair)?: A Lot Help needed standing up from a chair using your arms (e.g., wheelchair or bedside chair)?: A Lot Help needed to walk in hospital room?: Total Help needed climbing 3-5 steps with a railing? : Total 6 Click Score: 10    End of Session   Activity Tolerance: Patient tolerated treatment well;Patient limited by fatigue Patient left: in chair;with call bell/phone within  reach;with chair alarm set Nurse Communication: Mobility status PT Visit Diagnosis: Unsteadiness on feet (R26.81);Other abnormalities of gait and mobility (R26.89);Muscle weakness (generalized) (M62.81)    Time: [id number] PT Time Calculation (min) (ACUTE ONLY): 30 min   Charges:   PT Evaluation $PT Eval Moderate Complexity: 1 Mod PT Treatments $Therapeutic Activity: 23-37 mins        2:16 PM, 01/27/21 Lonell Grandchild, MPT Physical Therapist with [facility] [phone] office [phone] mobile phone

## 2021-01-27 NOTE — NC FL2 (Signed)
[place] LEVEL OF CARE SCREENING TOOL     IDENTIFICATION  Patient Name: Susan Davidson Birthdate: 1951/04/13 Sex: female Admission Date (Current Location): 01/26/2021  [facility] and [place] Number:  [employer] and Address:  [place] [address] [place]      Provider Number: [id number]  Attending Physician Name and Address:  Deatra James, MD  Relative Name and Phone Number:  RAELYNNE, FADEL (Spouse)   [phone]    Current Level of Care: Hospital Recommended Level of Care: [place] Prior Approval Number:    Date Approved/Denied:   PASRR Number:    Discharge Plan: SNF    Current Diagnoses: Patient Active Problem List   Diagnosis Date Noted  . Generalized weakness 01/27/2021  . Spinal stenosis at L4-L5 level 01/27/2021  . Ambulatory dysfunction 01/26/2021  . Tobacco use disorder 01/26/2021  . CKD (chronic kidney disease) 11/15/2020  . B12 deficiency 11/09/2020  . Urinary frequency 11/02/2018  . Daytime somnolence 10/19/2012  . Impaired glucose tolerance 08/27/2011  . Preventative health care 08/27/2011  . Vitamin D deficiency 04/14/2010  . HYPERSOMNIA 02/19/2009  . Hypothyroidism 02/12/2008  . HLD (hyperlipidemia) 02/12/2008  . ANXIETY 02/12/2008  . Depression 02/12/2008  . Essential hypertension 02/12/2008  . OSTEOPENIA 02/12/2008    Orientation RESPIRATION BLADDER Height & Weight     Place,Self,Situation  Normal Continent,External catheter Weight: 145 lb (65.8 kg) Height:  '5\' 4"'$  (162.6 cm)  BEHAVIORAL SYMPTOMS/MOOD NEUROLOGICAL BOWEL NUTRITION STATUS      Continent Diet (regular)  AMBULATORY STATUS COMMUNICATION OF NEEDS Skin   Extensive Assist Verbally Normal                       Personal Care Assistance Level of Assistance  Bathing,Feeding,Dressing,Total care Bathing Assistance: Maximum assistance Feeding assistance: Independent Dressing Assistance: Maximum  assistance Total Care Assistance: Maximum assistance   Functional Limitations Info  Sight,Hearing,Speech Sight Info: Adequate Hearing Info: Adequate Speech Info: Adequate    SPECIAL CARE FACTORS FREQUENCY  PT (By licensed PT),OT (By licensed OT)     PT Frequency: 5 times weekly OT Frequency: 5 times weekly            Contractures Contractures Info: Not present    Additional Factors Info  Code Status,Allergies,Psychotropic Code Status Info: FULL Allergies Info: Fire ant venom, epinephrine, influenza vac split quad Psychotropic Info: Risperdal, Paxil, Xanax         Current Medications (01/27/2021):  This is the current hospital active medication list Current Facility-Administered Medications  Medication Dose Route Frequency Provider Last Rate Last Admin  . acetaminophen (TYLENOL) tablet 650 mg  650 mg Oral Q4H PRN Zierle-Ghosh, Asia B, DO       Or  . acetaminophen (TYLENOL) 160 MG/5ML solution 650 mg  650 mg Per Tube Q4H PRN Zierle-Ghosh, Asia B, DO       Or  . acetaminophen (TYLENOL) suppository 650 mg  650 mg Rectal Q4H PRN Zierle-Ghosh, Asia B, DO      . ALPRAZolam (XANAX) tablet 0.5 mg  0.5 mg Oral BID PRN Zierle-Ghosh, Asia B, DO   0.5 mg at 01/27/21 1241  . aspirin tablet 325 mg  325 mg Oral Daily Shahmehdi, Seyed A, MD      . heparin injection 5,000 Units  5,000 Units Subcutaneous Q8H Zierle-Ghosh, Asia B, DO   5,000 Units at 01/27/21 0547  . levothyroxine (SYNTHROID) tablet 50 mcg  50 mcg Oral Q0600  Zierle-Ghosh, Asia B, DO   50 mcg at 01/27/21 0547  . lisinopril (ZESTRIL) tablet 10 mg  10 mg Oral Daily Zierle-Ghosh, Asia B, DO   10 mg at 01/27/21 0854  . ondansetron (ZOFRAN) injection 4 mg  4 mg Intravenous Q6H PRN Zierle-Ghosh, Asia B, DO      . PARoxetine (PAXIL) tablet 20 mg  20 mg Oral Daily Zierle-Ghosh, Asia B, DO   20 mg at 01/27/21 0854  . pravastatin (PRAVACHOL) tablet 40 mg  40 mg Oral q1800 Shahmehdi, Seyed A, MD      . risperiDONE (RISPERDAL) tablet 1  mg  1 mg Oral BID Zierle-Ghosh, Asia B, DO   1 mg at 01/27/21 0854  . vitamin B-12 (CYANOCOBALAMIN) tablet 1,000 mcg  1,000 mcg Oral Daily Zierle-Ghosh, Asia B, DO   1,000 mcg at 01/27/21 [id number]     Discharge Medications: Please see discharge summary for a list of discharge medications.  Relevant Imaging Results:  Relevant Lab Results:   Additional Information SSN: [id number], [place]

## 2021-01-27 NOTE — Progress Notes (Signed)
Acte CVA  MRI/ MR angio was reviewed . The case was discussed in detail with Dr. Leonel Ramsay on call to tele Neuro,  and  Dr. Quinn Axe at [facility]... they  recommend adding CTA head and neck. They both agreed that the patient dose not need to be transferred to [facility], no intervention was recommended at this time.  Neurologist from [facility] Dr. Merlene Laughter has been consulted earlier today..looking forward to his input.      PROGRESS NOTE    Patient: Susan Davidson                            PCP: Biagio Borg, MD                    DOB: [birth year]            DOA: 01/26/2021 [id number]             DOS: 01/27/2021, 7:15 PM   LOS: 0 days   Date of Service: The patient was seen and examined on 01/27/2021    Consultants: Neurologist, Tele. Neurologist    -----------------------------------------------------------------------------------------------------  Code Status:   Code Status: Full Code  Family Communication:  Patient's husband was updated     Level of care: Telemetry   Medication:  . aspirin  325 mg Oral Daily  . heparin  5,000 Units Subcutaneous Q8H  . levothyroxine  50 mcg Oral Q0600  . PARoxetine  20 mg Oral Daily  . pravastatin  40 mg Oral q1800  . risperiDONE  1 mg Oral BID  . vitamin B-12  1,000 mcg Oral Daily    acetaminophen **OR** acetaminophen (TYLENOL) oral liquid 160 mg/5 mL **OR** acetaminophen, ALPRAZolam, ondansetron (ZOFRAN) IV   Objective:   Vitals:   01/27/21 0954 01/27/21 1133 01/27/21 1349 01/27/21 1745  BP: 111/62 114/63 (!) 145/70 (!) 145/74  Pulse: 96 91 87 86  Resp: 20 (!) '21 14 16  '$ Temp: 97.7 F (36.5 C) 97.7 F (36.5 C) (!) 97.4 F (36.3 C) (!) 97.5 F (36.4 C)  TempSrc: Oral Oral Oral Oral  SpO2: 93%  94% 94%  Weight:      Height:         Radiology Reports CT Head Wo Contrast  Result Date: 01/26/2021 CLINICAL DATA:  Delirium.  Three falls this week due to weakness. EXAM: CT HEAD WITHOUT CONTRAST TECHNIQUE: Contiguous axial images were  obtained from the base of the skull through the vertex without intravenous contrast. COMPARISON:  None. FINDINGS: Brain: No evidence of acute infarction, hemorrhage, hydrocephalus, extra-axial collection or mass lesion/mass effect. Mild periventricular and deep white matter hypodensity typical of chronic small vessel ischemia. 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 Vascular: No hyperdense vessel. Skull: No fracture or focal lesion. Sinuses/Orbits: Sclerosis involving the left mandibular condyle is likely related to degenerative change at the temporomandibular joint. Paranasal sinuses are clear. No mastoid effusion. No acute orbital abnormality. Other: None. IMPRESSION: 1. No acute intracranial abnormality. 2. Mild chronic small vessel ischemia. 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 Electronically Signed   By: Keith Rake M.D.   On: 01/26/2021 18:59   CT Lumbar Spine Wo Contrast  Result Date: 01/26/2021 CLINICAL DATA:  Initial evaluation for acute weakness, inability to walk. EXAM: CT LUMBAR SPINE WITHOUT CONTRAST TECHNIQUE: Multidetector CT imaging of the lumbar spine was performed without intravenous contrast  administration. Multiplanar CT image reconstructions were also generated. COMPARISON:  None available. FINDINGS: Segmentation: Standard. Lowest well-formed disc space labeled the L5-S1 level. Alignment: Mild levoscoliosis. 3 mm retrolisthesis of L1 on L2, with 8 mm anterolisthesis of L4 on L5. Findings chronic and facet mediated. No pars defect. Vertebrae: Vertebral body height maintained without acute or chronic fracture. Visualized sacrum and pelvis intact. SI joints approximated symmetric. No discrete or worrisome osseous lesions. Paraspinal and other soft tissues: Paraspinous soft tissues demonstrate no acute finding. Moderate aorto bi-iliac atherosclerotic disease. Visualized  visceral structures otherwise unremarkable. Disc levels: L1-2: 3 mm retrolisthesis. Mild annular disc bulge with bilateral facet hypertrophy. No significant spinal stenosis. Mild to moderate bilateral L1 foraminal narrowing. L2-3: Minimal annular disc bulge. Moderate right worse than left facet hypertrophy. No significant spinal stenosis. Mild to moderate bilateral L2 foraminal stenosis. L3-4: Mild disc bulge, slightly eccentric to the right. Severe bilateral facet arthrosis. Resultant mild canal with bilateral subarticular stenosis. Moderate bilateral L3 foraminal narrowing. L4-5: 8 mm anterolisthesis. Associated broad posterior pseudo disc bulge/uncovering, eccentric to the right. Severe bilateral facet arthrosis with ligament flavum hypertrophy. Resultant severe canal with bilateral subarticular stenosis. Moderate to severe bilateral L4 foraminal narrowing, left worse than right. L5-S1: Minimal disc bulge. Moderate left worse than right facet hypertrophy. No spinal stenosis. Foramina remain patent. IMPRESSION: 1. No acute abnormality within the lumbar spine. 2. 8 mm anterolisthesis of L4 on L5 with resultant severe spinal stenosis, with moderate to severe bilateral L4 foraminal narrowing, left worse than right. 3. Additional multifactorial degenerative changes at L1-2, L2-3, L3-4, and L5-S1 with resultant mild to moderate bilateral L1, L2, and L3 foraminal stenosis as above. 4. Aortic Atherosclerosis (ICD10-I70.0). Electronically Signed   By: Jeannine Boga M.D.   On: 01/26/2021 21:16   MR ANGIO HEAD WO CONTRAST  Result Date: 01/27/2021 CLINICAL DATA:  Severe left-sided weakness.  Inability to ambulate. EXAM: MRI HEAD WITHOUT AND WITH CONTRAST MRA HEAD WITHOUT CONTRAST TECHNIQUE: Multiplanar, multiecho pulse sequences of the brain and surrounding structures were obtained without and with intravenous contrast. Angiographic images of the head were obtained using MRA technique without contrast. CONTRAST:   6.51m GADAVIST GADOBUTROL 1 MMOL/ML IV SOLN COMPARISON:  Head CT 01/26/2021 FINDINGS: MRI HEAD FINDINGS Brain: There is a moderate-sized acute distal right ACA territory infarct involving the body and splenium of the corpus callosum, right cingulate gyrus, and parasagittal right frontal and parietal lobes. Susceptibility artifact limits assessment of the posterior left cerebrum and cerebellum, mainly on diffusion-weighted and FLAIR sequences. T2 hyperintensities in the cerebral white matter bilaterally are nonspecific but compatible with mild chronic small vessel ischemic disease. Chronic lacunar infarcts are present in the bilateral basal ganglia, thalami, and cerebellum. There is mild cerebral atrophy. No abnormal enhancement is identified. Vascular: Major intracranial arterial flow voids are preserved. The major dural venous sinuses are enhancing. Skull and upper cervical spine: Unremarkable bone marrow signal. Sinuses/Orbits: Unremarkable orbits. Paranasal sinuses and mastoid air cells are clear. Other: None. MRA HEAD FINDINGS The visualized distal vertebral arteries are widely patent to the basilar with the left being slightly dominant. Patent PICA and SCA origins are identified bilaterally. The basilar artery is widely patent. There is a fetal type origin of the left PCA. A 2 mm outpouching from the distal basilar artery in the expected region of the left P1 origin may represent an aneurysm or the stump of an occluded P1 segment. There are severe distal right P2 and bilateral P3 stenoses. The internal carotid arteries are  patent from skull base to carotid termini. The cavernous segments are tortuous with atherosclerotic type irregularity, however no significant ICA stenosis is evident. ACAs and MCAs are patent with mild branch vessel irregularity but no evidence of a proximal branch occlusion or flow limiting A1 or M1 stenosis. An aneurysm projecting superiorly from the anterior communicating artery/right  A1-A2 junction region measures 5 x 4 mm IMPRESSION: 1. Moderate-sized acute distal right ACA territory infarct. 2. 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 3. Intracranial atherosclerosis without large vessel occlusion in the anterior circulation. 4. 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 5. 2 mm outpouching from the distal basilar artery which may represent an aneurysm versus the stump of an occluded left P1 segment. 6. 5 mm anterior communicating aneurysm. Electronically Signed   By: Logan Bores M.D.   On: 01/27/2021 14:38   MR BRAIN W WO CONTRAST  Result Date: 01/27/2021 CLINICAL DATA:  Severe left-sided weakness.  Inability to ambulate. EXAM: MRI HEAD WITHOUT AND WITH CONTRAST MRA HEAD WITHOUT CONTRAST TECHNIQUE: Multiplanar, multiecho pulse sequences of the brain and surrounding structures were obtained without and with intravenous contrast. Angiographic images of the head were obtained using MRA technique without contrast. CONTRAST:  6.37m GADAVIST GADOBUTROL 1 MMOL/ML IV SOLN COMPARISON:  Head CT 01/26/2021 FINDINGS: MRI HEAD FINDINGS Brain: There is a moderate-sized acute distal right ACA territory infarct involving the body and splenium of the corpus callosum, right cingulate gyrus, and parasagittal right frontal and parietal lobes. Susceptibility artifact limits assessment of the posterior left cerebrum and cerebellum, mainly on diffusion-weighted and FLAIR sequences. T2 hyperintensities in the cerebral white matter bilaterally are nonspecific but compatible with mild chronic small vessel ischemic disease. Chronic lacunar infarcts are present in the bilateral basal ganglia, thalami, and cerebellum. There is mild cerebral atrophy. No abnormal enhancement is identified. Vascular: Major intracranial arterial flow voids are preserved. The major dural venous sinuses are enhancing. Skull and upper cervical spine:  Unremarkable bone marrow signal. Sinuses/Orbits: Unremarkable orbits. Paranasal sinuses and mastoid air cells are clear. Other: None. MRA HEAD FINDINGS The visualized distal vertebral arteries are widely patent to the basilar with the left being slightly dominant. Patent PICA and SCA origins are identified bilaterally. The basilar artery is widely patent. There is a fetal type origin of the left PCA. A 2 mm outpouching from the distal basilar artery in the expected region of the left P1 origin may represent an aneurysm or the stump of an occluded P1 segment. There are severe distal right P2 and bilateral P3 stenoses. The internal carotid arteries are patent from skull base to carotid termini. The cavernous segments are tortuous with atherosclerotic type irregularity, however no significant ICA stenosis is evident. ACAs and MCAs are patent with mild branch vessel irregularity but no evidence of a proximal branch occlusion or flow limiting A1 or M1 stenosis. An aneurysm projecting superiorly from the anterior communicating artery/right A1-A2 junction region measures 5 x 4 mm IMPRESSION: 1. Moderate-sized acute distal right ACA territory infarct. 2. 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 3. Intracranial atherosclerosis without large vessel occlusion in the anterior circulation. 4. 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 5. 2 mm outpouching from the distal basilar artery which may represent an aneurysm versus the stump of an occluded left P1 segment. 6. 5 mm anterior communicating aneurysm. Electronically Signed   By:  Logan Bores M.D.   On: 01/27/2021 14:38   US Carotid Bilateral (at [facility] and [facility] only)  Result Date: 01/27/2021 CLINICAL DATA:  Ambulatory dysfunction Hypertension Hyperlipidemia Tobacco use EXAM: BILATERAL CAROTID DUPLEX ULTRASOUND TECHNIQUE: Pearline Cables scale imaging, color Doppler and duplex ultrasound were performed  of bilateral carotid and vertebral arteries in the neck. COMPARISON:  None. FINDINGS: Criteria: Quantification of carotid stenosis is based on velocity parameters that correlate the residual internal carotid diameter with NASCET-based stenosis levels, using the diameter of the distal internal carotid lumen as the denominator for stenosis measurement. The following velocity measurements were obtained: RIGHT ICA: 70/22 cm/sec CCA: [id number] cm/sec SYSTOLIC ICA/CCA RATIO:  1.5 ECA: 120 cm/sec LEFT ICA: 83/14 cm/sec CCA: [id number] cm/sec SYSTOLIC ICA/CCA RATIO:  1.2 ECA: 156 cm/sec RIGHT CAROTID ARTERY: Mild atheromatous plaque of the carotid bulb extending into the internal carotid artery origin velocity parameters indicative of less than 50% stenosis. RIGHT VERTEBRAL ARTERY:  Antegrade flow. LEFT CAROTID ARTERY: Bulky heterogeneous plaque of the left carotid bifurcation with velocity parameters indicative of less than 50% stenosis. LEFT VERTEBRAL ARTERY:  Antegrade flow. IMPRESSION: Less than 50% stenosis of the internal carotid arteries. Degree of stenosis in the left internal carotid artery may be greater as bulky plaque at the origin may be obscuring higher velocities. Further evaluation with CT angiography of the neck would be beneficial. Electronically Signed   By: Miachel Roux M.D.   On: 01/27/2021 10:16    SIGNED: Deatra James, MD, FHM. [facility],  Pager (please use [email or web address] to page/text) Please use Epic Secure Chat for non-urgent communication (7AM-7PM)  If 7PM-7AM, please contact night-coverage [email or web address], 01/27/2021, 7:15 PM

## 2021-01-27 NOTE — Consult Note (Signed)
[place] A. Merlene Laughter, MD     [email or web address]          Susan Davidson is an 70 y.o. female.   ASSESSMENT/PLAN: 1. ACUTE GAIT IMPAIRMENT AND SEVERE DYSARTHRIA WITH LEFT-SIDED WEAKNESS LEG MORE THAN UPPER EXTREMITY DUE TO ACUTE RIGHT ACA INFARCT. 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 DUAL ANTIPLATELET AGENTS ARE RECOMMENDED FOR 1 MONTH AND THEN A SINGLE AGENT AFTERWARDS. ASPIRIN 325 DAILY SHOULD SUFFICE. A 30 DAY CARDIAC EVENT MONITOR IS RECOMMENDED. The patient with likely need physiotherapy including occupational, physical and speech therapies. 2. Hypertension 3. Dyslipidemia  4. Anterior communicating aneurysm asymptomatic but this should be addressed referring to Interventional neuroradiology.  This is 70 year old white female who presents with the acute onset recurrent falls, difficulty ambulating and difficulty speaking. Appears patient had COVID-19 in 2021 has some difficulties with ambulating at that time but she recovered from that. Baseline, she has been able to ambulate and the was cognitively doing well.  Patient has been noted to left-sided weakness on examination. She does not report dizziness, chest pain or syncope. The review systems otherwise negative.       GENERAL: She appears to be in some discomfort.  HEENT:  Neck is supple no trauma noted.  ABDOMEN: soft  EXTREMITIES: No edema   BACK: Normal  SKIN: Normal by inspection.    MENTAL STATUS:  She is awake and oriented to location. She has a severe dysarthria. She follows commands well.   CRANIAL NERVES: Pupils are equal, round and reactive to light and accomodation; extra ocular movements are full, there is no significant nystagmus; visual fields are full; upper and lower facial muscles are normal in strength and symmetric, there is no flattening of the nasolabial folds; tongue is midline; uvula is midline; shoulder elevation is normal.  MOTOR: Right side shows normal tone, bulk and  strength without drift. Left upper extremity 3/5 with the significant drift to the table. Left lower extremity is 0/5.  COORDINATION: Left finger to nose is normal, right finger to nose is normal, No rest tremor; no intention tremor; no postural tremor; no bradykinesia.  REFLEXES: Deep tendon reflexes are symmetrical and normal.   SENSATION: Normal pain.      Blood pressure (!) 145/74, pulse 86, temperature (!) 97.5 F (36.4 C), temperature source Oral, resp. rate 16, height '5\' 4"'$  (1.626 m), weight 65.8 kg, SpO2 94 %.  Past Medical History:  Diagnosis Date  . ANXIETY 02/12/2008   Qualifier: Diagnosis of  By: Jenny Reichmann MD, Hunt Oris   . DEPRESSION 02/12/2008   Qualifier: Diagnosis of  By: Elveria Royals   . HYPERLIPIDEMIA 02/12/2008   Qualifier: Diagnosis of  By: Elveria Royals   . HYPERTENSION 02/12/2008   Qualifier: Diagnosis of  By: Elveria Royals   . HYPOTHYROIDISM 02/12/2008   Qualifier: Diagnosis of  By: Elveria Royals   . Impaired glucose tolerance 08/27/2011  . OSTEOPENIA 02/12/2008   Qualifier: Diagnosis of  By: Jenny Reichmann MD, Hunt Oris   . VITAMIN D DEFICIENCY 04/14/2010   Qualifier: Diagnosis of  By: Jenny Reichmann MD, Hunt Oris     History reviewed. No pertinent surgical history.  Family History  Problem Relation Age of Onset  . Heart disease Father   . Bipolar disorder Sister   . Diabetes Neg Hx     Social History:  reports that she has been smoking. She has never used smokeless tobacco. She reports current alcohol use. She reports that she does not  use drugs.  Allergies:  Allergies  Allergen Reactions  . Other Anaphylaxis    Fire ant venom  . Epinephrine   . Influenza Vac Split Quad     fatigue    Medications: Prior to Admission medications   Medication Sig Start Date End Date Taking? Authorizing Provider  ALPRAZolam Duanne Moron) 0.5 MG tablet Take 1 tablet (0.5 mg total) by mouth 2 (two) times daily as needed for anxiety. 03/29/19  Yes Biagio Borg, MD  amphetamine-dextroamphetamine (ADDERALL) 20 MG tablet Take 20 mg by mouth 2 (two) times daily.   Yes [provider]  levothyroxine (SYNTHROID) 50 MCG tablet TAKE 1 TABLET BY MOUTH DAILY BEFORE BREAKFAST 11/09/20  Yes Biagio Borg, MD  lisinopril (ZESTRIL) 10 MG tablet Take 1 tablet (10 mg total) by mouth daily. 11/09/20  Yes Biagio Borg, MD  lovastatin (MEVACOR) 20 MG tablet TAKE 1 TABLET BY MOUTH EVERY EVENING AT 6 PM 11/09/20  Yes Biagio Borg, MD  PARoxetine (PAXIL) 40 MG tablet Take 2 tablets by mouth daily. 01/20/21  Yes [provider]  risperiDONE (RISPERDAL) 1 MG tablet Take 1 mg by mouth 2 (two) times daily. 10/27/20  Yes [provider]  vitamin B-12 (CYANOCOBALAMIN) 1000 MCG tablet Take 1 tablet (1,000 mcg total) by mouth daily. 03/14/20  Yes Biagio Borg, MD    Scheduled Meds: . aspirin  325 mg Oral Daily  . heparin  5,000 Units Subcutaneous Q8H  . levothyroxine  50 mcg Oral Q0600  . PARoxetine  20 mg Oral Daily  . pravastatin  40 mg Oral q1800  . risperiDONE  1 mg Oral BID  . vitamin B-12  1,000 mcg Oral Daily   Continuous Infusions: PRN Meds:.acetaminophen **OR** acetaminophen (TYLENOL) oral liquid 160 mg/5 mL **OR** acetaminophen, ALPRAZolam, ondansetron (ZOFRAN) IV     Results for orders placed or performed during the hospital encounter of 01/26/21 (from the past 48 hour(s))  Comprehensive metabolic panel     Status: Abnormal   Collection Time: 01/26/21  6:14 PM  Result Value Ref Range   Sodium 137 135 - 145 mmol/L   Potassium 4.1 3.5 - 5.1 mmol/L   Chloride 104 98 - 111 mmol/L   CO2 24 22 - 32 mmol/L   Glucose, Bld 117 (H) 70 - 99 mg/dL    Comment: Glucose reference range applies only to samples taken after fasting for at least 8 hours.   BUN 19 8 - 23 mg/dL   Creatinine, Ser 1.22 (H) 0.44 - 1.00 mg/dL   Calcium 9.0 8.9 - 10.3 mg/dL   Total Protein 7.5 6.5 - 8.1 g/dL   Albumin 4.3 3.5 - 5.0 g/dL   AST 21 15 - 41 U/L   ALT 18 0 -  44 U/L   Alkaline Phosphatase 48 38 - 126 U/L   Total Bilirubin 1.0 0.3 - 1.2 mg/dL   GFR, Estimated 47 (L) >60 mL/min    Comment: (NOTE) Calculated using the CKD-EPI Creatinine Equation (2021)    Anion gap 9 5 - 15    Comment: Performed at [facility], [address], [place], [place] [address]  CBC with Differential     Status: Abnormal   Collection Time: 01/26/21  6:14 PM  Result Value Ref Range   WBC 10.9 (H) 4.0 - 10.5 K/uL   RBC 4.35 3.87 - 5.11 MIL/uL   Hemoglobin 14.5 12.0 - 15.0 g/dL   HCT 45.5 36.0 - 46.0 %   MCV 104.6 (  H) 80.0 - 100.0 fL   MCH 33.3 26.0 - 34.0 pg   MCHC 31.9 30.0 - 36.0 g/dL   RDW 14.2 11.5 - 15.5 %   Platelets 200 150 - 400 K/uL   nRBC 0.0 0.0 - 0.2 %   Neutrophils Relative % 81 %   Neutro Abs 9.0 (H) 1.7 - 7.7 K/uL   Lymphocytes Relative 10 %   Lymphs Abs 1.1 0.7 - 4.0 K/uL   Monocytes Relative 7 %   Monocytes Absolute 0.7 0.1 - 1.0 K/uL   Eosinophils Relative 1 %   Eosinophils Absolute 0.1 0.0 - 0.5 K/uL   Basophils Relative 1 %   Basophils Absolute 0.1 0.0 - 0.1 K/uL   Immature Granulocytes 0 %   Abs Immature Granulocytes 0.03 0.00 - 0.07 K/uL    Comment: Performed at [facility], [address] [place], [place] [address]  Folate, serum, performed at [facility] lab     Status: None   Collection Time: 01/26/21  6:14 PM  Result Value Ref Range   Folate 15.6 >5.9 ng/mL    Comment: Performed at [facility], [address], [place], [place] [address]  Resp Panel by RT-PCR (Flu A&B, Covid) Nasopharyngeal Swab     Status: None   Collection Time: 01/26/21  8:05 PM   Specimen: Nasopharyngeal Swab; Nasopharyngeal(NP) swabs in vial transport medium  Result Value Ref Range   SARS Coronavirus 2 by RT PCR NEGATIVE NEGATIVE    Comment: (NOTE) SARS-CoV-2 target nucleic acids are NOT DETECTED.  The SARS-CoV-2 RNA is generally detectable in upper respiratory specimens during the acute phase of infection. The lowest concentration of SARS-CoV-2 viral  copies this assay can detect is 138 copies/mL. A negative result does not preclude SARS-Cov-2 infection and should not be used as the sole basis for treatment or other patient management decisions. A negative result may occur with  improper specimen collection/handling, submission of specimen other than nasopharyngeal swab, presence of viral mutation(s) within the areas targeted by this assay, and inadequate number of viral copies(<138 copies/mL). A negative result must be combined with clinical observations, patient history, and epidemiological information. The expected result is Negative.  Fact Sheet for Patients:  EntrepreneurPulse.com.au  Fact Sheet for Healthcare Providers:  IncredibleEmployment.be  This test is no t yet approved or cleared by the [place] FDA and  has been authorized for detection and/or diagnosis of SARS-CoV-2 by FDA under an Emergency Use Authorization (EUA). This EUA will remain  in effect (meaning this test can be used) for the duration of the COVID-19 declaration under Section 564(b)(1) of the Act, 21 U.S.C.section 360bbb-3(b)(1), unless the authorization is terminated  or revoked sooner.       Influenza A by PCR NEGATIVE NEGATIVE   Influenza B by PCR NEGATIVE NEGATIVE    Comment: (NOTE) The Xpert Xpress SARS-CoV-2/FLU/RSV plus assay is intended as an aid in the diagnosis of influenza from Nasopharyngeal swab specimens and should not be used as a sole basis for treatment. Nasal washings and aspirates are unacceptable for Xpert Xpress SARS-CoV-2/FLU/RSV testing.  Fact Sheet for Patients: EntrepreneurPulse.com.au  Fact Sheet for Healthcare Providers: IncredibleEmployment.be  This test is not yet approved or cleared by the [place] FDA and has been authorized for detection and/or diagnosis of SARS-CoV-2 by FDA under an Emergency Use Authorization (EUA). This EUA will  remain in effect (meaning this test can be used) for the duration of the COVID-19 declaration under Section 564(b)(1) of the Act, 21 U.S.C.  section 360bbb-3(b)(1), unless the authorization is terminated or revoked.  Performed at [facility], [address] [place], [place] [address]   VITAMIN D 25 Hydroxy (Vit-D Deficiency, Fractures)     Status: Abnormal   Collection Time: 01/27/21  5:55 AM  Result Value Ref Range   Vit D, 25-Hydroxy 21.70 (L) 30 - 100 ng/mL    Comment: (NOTE) Vitamin D deficiency has been defined by the [place] practice guideline as a level of serum 25-OH  vitamin D less than 20 ng/mL (1,2). The Endocrine Society went on to  further define vitamin D insufficiency as a level between 21 and 29  ng/mL (2).  1. IOM (Institute of Medicine). 2010. Dietary reference intakes for  calcium and D. [place]: The [employer]. 2. Holick MF, Binkley [place], Bischoff-Ferrari HA, et al. Evaluation,  treatment, and prevention of vitamin D deficiency: an Endocrine  Society clinical practice guideline, JCEM. 2011 Jul; 96(7): 1911-30.  Performed at [facility], [place] [address] [place], [place] [address]   TSH     Status: Abnormal   Collection Time: 01/27/21  5:55 AM  Result Value Ref Range   TSH 26.362 (H) 0.350 - 4.500 uIU/mL    Comment: Performed by a 3rd Generation assay with a functional sensitivity of <=0.01 uIU/mL. Performed at [facility], [address], [place], [place] [address]   Vitamin B12     Status: None   Collection Time: 01/27/21  5:55 AM  Result Value Ref Range   Vitamin B-12 509 180 - 914 pg/mL    Comment: (NOTE) This assay is not validated for testing neonatal or myeloproliferative syndrome specimens for Vitamin B12 levels. Performed at [facility], [address] [place], [place] [address]   Hemoglobin A1c     Status: Abnormal   Collection Time: 01/27/21  5:55 AM  Result Value Ref Range    Hgb A1c MFr Bld 6.4 (H) 4.8 - 5.6 %    Comment: (NOTE) Pre diabetes:          5.7%-6.4%  Diabetes:              >6.4%  Glycemic control for   <7.0% adults with diabetes    Mean Plasma Glucose 136.98 mg/dL    Comment: Performed at [facility] [address], [place], [place] [address]  Lipid panel     Status: Abnormal   Collection Time: 01/27/21  5:55 AM  Result Value Ref Range   Cholesterol 184 0 - 200 mg/dL   Triglycerides 121 <150 mg/dL   HDL 56 >40 mg/dL   Total CHOL/HDL Ratio 3.3 RATIO   VLDL 24 0 - 40 mg/dL   LDL Cholesterol 104 (H) 0 - 99 mg/dL    Comment:        Total Cholesterol/HDL:CHD Risk Coronary Heart Disease Risk Table                     Men   Women  1/2 Average Risk   3.4   3.3  Average Risk       5.0   4.4  2 X Average Risk   9.6   7.1  3 X Average Risk  23.4   11.0        Use the calculated Patient Ratio above and the CHD Risk Table to determine the patient's CHD Risk.        ATP III CLASSIFICATION (LDL):  <  100     mg/dL   Optimal  100-129  mg/dL   Near or Above                    Optimal  130-159  mg/dL   Borderline  160-189  mg/dL   High  >190     mg/dL   Very High Performed at [facility], [address] [place], [place] [address]     Studies/Results:  BRAIN MRI MRA FINDINGS: MRI HEAD FINDINGS  Brain: There is a moderate-sized acute distal right ACA territory infarct involving the body and splenium of the corpus callosum, right cingulate gyrus, and parasagittal right frontal and parietal lobes. Susceptibility artifact limits assessment of the posterior left cerebrum and cerebellum, mainly on diffusion-weighted and FLAIR sequences. T2 hyperintensities in the cerebral white matter bilaterally are nonspecific but compatible with mild chronic small vessel ischemic disease. Chronic lacunar infarcts are present in the bilateral basal ganglia, thalami, and cerebellum. There is mild cerebral atrophy. No abnormal enhancement is  identified.  Vascular: Major intracranial arterial flow voids are preserved. The major dural venous sinuses are enhancing.  Skull and upper cervical spine: Unremarkable bone marrow signal.  Sinuses/Orbits: Unremarkable orbits. Paranasal sinuses and mastoid air cells are clear.  Other: None.  MRA HEAD FINDINGS  The visualized distal vertebral arteries are widely patent to the basilar with the left being slightly dominant. Patent PICA and SCA origins are identified bilaterally. The basilar artery is widely patent. There is a fetal type origin of the left PCA. A 2 mm outpouching from the distal basilar artery in the expected region of the left P1 origin may represent an aneurysm or the stump of an occluded P1 segment. There are severe distal right P2 and bilateral P3 stenoses.  The internal carotid arteries are patent from skull base to carotid termini. The cavernous segments are tortuous with atherosclerotic type irregularity, however no significant ICA stenosis is evident. ACAs and MCAs are patent with mild branch vessel irregularity but no evidence of a proximal branch occlusion or flow limiting A1 or M1 stenosis. An aneurysm projecting superiorly from the anterior communicating artery/right A1-A2 junction region measures 5 x 4 mm  IMPRESSION: 1. Moderate-sized acute distal right ACA territory infarct. 2. 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 3. Intracranial atherosclerosis without large vessel occlusion in the anterior circulation. 4. 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 5. 2 mm outpouching from the distal basilar artery which may represent an aneurysm versus the stump of an occluded left P1 segment. 6. 5 mm anterior communicating aneurysm.   CAROTID  IMPRESSION: Less than 50% stenosis of the internal carotid arteries. Degree of stenosis in the left internal carotid  artery may be greater as bulky plaque at the origin may be obscuring higher velocities. Further evaluation with CT angiography of the neck would be beneficial.    THE BRAIN MRI MRA ARE REVIEWED IN PERSON. Right ACA acute infarct is noted on DWI. Multiple small encephalomalacia consistent with remote lacunar infarcts are noted in the thalamic and basal ganglia bilaterally along with the inferior aspect of the right cerebellum. There appears to be most likely artifact involving left cerebellum and posterior cerebrum with the fullness and reduced signal on FLAIR imaging but no other not is noted on on the imaging studies.  MRA shows bilateral PCA stenosis. There is a clear  Anterior communicating aneurysm.   HEAD NECK CTA  FINDINGS: CTA NECK FINDINGS  Aortic arch: Incomplete imaging of the aortic arch with exclusion of the brachiocephalic artery origin. The included portions of the brachiocephalic and subclavian arteries are patent with calcified and soft plaque in the subclavian arteries not resulting in significant stenosis.  Right carotid system: Patent with a moderate amount of predominantly calcified plaque at the carotid bifurcation. No evidence of a significant stenosis or dissection.  Left carotid system: Patent with prominently calcified plaque at the carotid bifurcation. No evidence of a significant stenosis or dissection.  Vertebral arteries: Patent with calcified plaque at the right vertebral artery origin. No evidence of a significant stenosis or dissection. Mildly dominant left vertebral artery.  Skeleton: Asymmetrically severe left TMJ arthropathy. Mild cervical disc degeneration. Advanced left facet arthrosis at C2-3.  Other neck: No evidence of cervical lymphadenopathy or mass.  Upper chest: No apical lung consolidation or mass.  Review of the MIP images confirms the above findings  CTA HEAD FINDINGS  Anterior circulation: The internal carotid arteries  are patent from skull base to carotid termini with a small amount of calcified plaque bilaterally not resulting in significant stenosis. ACAs and MCAs are patent with moderate distal ACA branch vessel irregular narrowing but no evidence of a proximal branch occlusion or significant proximal stenosis. A superiorly projecting aneurysm from the anterior communicating artery/right A1-A2 junction measures 5 x 4 mm.  Posterior circulation: The intracranial vertebral arteries are widely patent to the basilar. Patent PICA and SCA origins are visualized bilaterally. The basilar artery is widely patent. There is a large left posterior communicating artery with markedly hypoplastic left P1 segment. The 2 mm outpouching at the left P1 origin on today's MRA has an appearance of an infundibulum on CTA rather than an aneurysm. Both PCAs are patent with atherosclerotic type irregularity including mild distal right P2 and bilateral P3 stenoses. The appearance of more severe PCA stenoses on MRA was likely due to artifactual signal loss.  Venous sinuses: Patent.  Anatomic variants: None.  Review of the MIP images confirms the above findings  IMPRESSION: 1. Intracranial atherosclerosis primarily infecting the small branch vessels. No large vessel occlusion. 2. 5 mm anterior communicating aneurysm. 3. Cervical carotid artery atherosclerosis without significant Stenosis.    Fleda Pagel A. Merlene Laughter, M.D.  Diplomate, Tax adviser of Psychiatry and Neurology ( Neurology). 01/27/2021, 7:01 PM

## 2021-01-27 NOTE — TOC Initial Note (Signed)
Transition of Care [facility]) - Initial/Assessment Note    Patient Details  Name: Susan Davidson MRN: [id number] Date of Birth: [birth year]  [facility] CM/SW Contact:    Iona Beard, [place] Phone Number: 01/27/2021, 11:51 AM  Clinical Narrative:                 CSW spoke to pts husband Ifrah Golly due to pts being oriented x2. CW spoke with Mr. Sharaf about PT and OT recommending SNF for pt. Pt lives with her husband. Pt is typically independent in completing ADLs. Pt stopped driving a year ago and her husband providse her transportation when needed. Pt has not had HH. Pt has a cane and walker to use when needed. CSW inquired about interest in SNF for pt, pts husband states "sounds necessary", Mr. Polega states he is agreeable to SNF referral. CSW to send out referral to local facilities. TOC to follow.   Expected Discharge Plan: Skilled Nursing Facility Barriers to Discharge: Continued Medical Work up   Patient Goals and CMS Choice Patient states their goals for this hospitalization and ongoing recovery are:: Go to SNF CMS Medicare.gov Compare Post Acute Care list provided to:: Patient Represenative (must comment) Choice offered to / list presented to : Spouse  Expected Discharge Plan and Services Expected Discharge Plan: [place] In-house Referral: Clinical Social Work Discharge Planning Services: CM Consult Post Acute Care Choice: [place] arrangements for the past 2 months: Single Family Home                 DME Arranged: N/A DME Agency: NA       HH Arranged: NA [place] Agency: NA        Prior Living Arrangements/Services Living arrangements for the past 2 months: [place] Lives with:: Spouse Patient language and need for interpreter reviewed:: Yes Do you feel safe going back to the place where you live?: Yes      Need for Family Participation in Patient Care: Yes (Comment) Care giver support system in place?: Yes  (comment) Current home services: DME Criminal Activity/Legal Involvement Pertinent to Current Situation/Hospitalization: No - Comment as needed  Activities of Daily Living Home Assistive Devices/Equipment: Gilford Rile (specify type) ADL Screening (condition at time of admission) Patient's cognitive ability adequate to safely complete daily activities?: No Is the patient deaf or have difficulty hearing?: No Does the patient have difficulty seeing, even when wearing glasses/contacts?: No Does the patient have difficulty concentrating, remembering, or making decisions?: Yes Patient able to express need for assistance with ADLs?: Yes Does the patient have difficulty dressing or bathing?: Yes Independently performs ADLs?: No Communication: Independent Dressing (OT): Needs assistance Is this a change from baseline?: Change from baseline, expected to last >3 days Grooming: Needs assistance Is this a change from baseline?: Change from baseline, expected to last >3 days Feeding: Needs assistance Is this a change from baseline?: Change from baseline, expected to last >3 days Bathing: Needs assistance Is this a change from baseline?: Change from baseline, expected to last >3 days Toileting: Needs assistance Is this a change from baseline?: Change from baseline, expected to last >3days In/Out Bed: Needs assistance Is this a change from baseline?: Change from baseline, expected to last >3 days Walks in Home: Needs assistance Is this a change from baseline?: Change from baseline, expected to last >3 days Does the patient have difficulty walking or climbing stairs?: Yes Weakness of Legs: Left Weakness of Arms/Hands: Left  Permission Sought/Granted                  Emotional Assessment Appearance:: Appears younger than stated age Attitude/Demeanor/Rapport: Unable to Assess Affect (typically observed): Unable to Assess Orientation: : Oriented to Place,Oriented to Self Alcohol / Substance Use:  Not Applicable Psych Involvement: No (comment)  Admission diagnosis:  Delirium [R41.0] Weakness [R53.1] Ambulatory dysfunction [R26.2] Patient Active Problem List   Diagnosis Date Noted  . Generalized weakness 01/27/2021  . Spinal stenosis at L4-L5 level 01/27/2021  . Ambulatory dysfunction 01/26/2021  . Tobacco use disorder 01/26/2021  . CKD (chronic kidney disease) 11/15/2020  . B12 deficiency 11/09/2020  . Urinary frequency 11/02/2018  . Daytime somnolence 10/19/2012  . Impaired glucose tolerance 08/27/2011  . Preventative health care 08/27/2011  . Vitamin D deficiency 04/14/2010  . HYPERSOMNIA 02/19/2009  . Hypothyroidism 02/12/2008  . HLD (hyperlipidemia) 02/12/2008  . ANXIETY 02/12/2008  . Depression 02/12/2008  . Essential hypertension 02/12/2008  . OSTEOPENIA 02/12/2008   PCP:  Biagio Borg, MD Pharmacy:   [facility] [place], [place] [address] Phone: [phone] Fax: [phone]     Social Determinants of Health (SDOH) Interventions    Readmission Risk Interventions No flowsheet data found.

## 2021-01-27 NOTE — Evaluation (Signed)
Speech Language Pathology Evaluation Patient Details Name: Susan Davidson MRN: [id number] DOB: 15-Mar-1951 Today's Date: 01/27/2021 Time: [id number] SLP Time Calculation (min) (ACUTE ONLY): 22 min  Problem List:  Patient Active Problem List   Diagnosis Date Noted  . Generalized weakness 01/27/2021  . Spinal stenosis at L4-L5 level 01/27/2021  . Ambulatory dysfunction 01/26/2021  . Tobacco use disorder 01/26/2021  . CKD (chronic kidney disease) 11/15/2020  . B12 deficiency 11/09/2020  . Urinary frequency 11/02/2018  . Daytime somnolence 10/19/2012  . Impaired glucose tolerance 08/27/2011  . Preventative health care 08/27/2011  . Vitamin D deficiency 04/14/2010  . HYPERSOMNIA 02/19/2009  . Hypothyroidism 02/12/2008  . HLD (hyperlipidemia) 02/12/2008  . ANXIETY 02/12/2008  . Depression 02/12/2008  . Essential hypertension 02/12/2008  . OSTEOPENIA 02/12/2008   Past Medical History:  Past Medical History:  Diagnosis Date  . ANXIETY 02/12/2008   Qualifier: Diagnosis of  By: Jenny Reichmann MD, Hunt Oris   . DEPRESSION 02/12/2008   Qualifier: Diagnosis of  By: Elveria Royals   . HYPERLIPIDEMIA 02/12/2008   Qualifier: Diagnosis of  By: Elveria Royals   . HYPERTENSION 02/12/2008   Qualifier: Diagnosis of  By: Elveria Royals   . HYPOTHYROIDISM 02/12/2008   Qualifier: Diagnosis of  By: Elveria Royals   . Impaired glucose tolerance 08/27/2011  . OSTEOPENIA 02/12/2008   Qualifier: Diagnosis of  By: Jenny Reichmann MD, Hunt Oris   . VITAMIN D DEFICIENCY 04/14/2010   Qualifier: Diagnosis of  By: Jenny Reichmann MD, Hunt Oris    Past Surgical History: History reviewed. No pertinent surgical history. HPI:  Susan Davidson  is a 70 y.o. female, with history of vitamin D deficiency, vitamin B12 deficiency, hypothyroidism, hypertension, hyperlipidemia, psych disorder, and more presents to ED with a chief complaint.  Patient is a notably poor historian.  She reports that she had sudden onset of  ambulatory dysfunction today.  She reports her last known well was yesterday.  She is not able to give more specific times than that.  She reports that when she tries to walk she falls to the left.  She reports that this is never happened before, but that she has had trouble with falling to the right in the past.  She reports that her trouble falling to the right started after she took the first COVID shot, and she thinks that this is related to that as well.  She did the first COVID shot in January 2021.  She denies any numbness or injury to her back or legs.  She reports that she did fall to the ground and bumped her head when she first noted that she could not walk.  She did not have loss of consciousness.  She reports that she landed flat on her left side, but did not hit her hip or her knee.  She has no pain from the fall.  Patient reports that she has had decreased appetite today.  No dysphagia, no difficulty talking.  She reports no change in vision, and possible 2 days of muffled hearing.  She reports that she does have allergies.  Her last normal bowel movement was yesterday.  She has no other complaints at this time. SLE requested. MRI pending.   Assessment / Plan / Recommendation Clinical Impression  Pt presents with mild/moderate cognitive linguistic deficits characterized by impairments in attention, memory, and dysarthric speech. Pt with some awareness of deficits (speech), but makes little attempt to compensate. She was disoriented to July 2021, but was  able to recall month after SLP provided the information ~5 minutes later. Pt leaning to her left and demostrates left facial weakness. She reportedly passed the Yale swallow screen and RN stated that she did well taking her medications this AM. Given reduced insight and left facial weakness, Pt should be closely monitored for any swallowing difficulties. Recommend f/u SLP services at SNF to address cognitive communication deficits. SLP will follow  during acute stay for the same.    SLP Assessment  SLP Recommendation/Assessment: Patient needs continued Speech Lanaguage Pathology Services SLP Visit Diagnosis: Dysarthria and anarthria (R47.1);Cognitive communication deficit (R41.841)    Follow Up Recommendations  Skilled Nursing facility    Frequency and Duration min 2x/week  1 week      SLP Evaluation Cognition  Overall Cognitive Status: Impaired/Different from baseline Arousal/Alertness: Awake/alert Orientation Level: Disoriented to time Memory: Impaired Awareness: Impaired Awareness Impairment: Emergent impairment Safety/Judgment: Impaired       Comprehension  Auditory Comprehension Overall Auditory Comprehension: Appears within functional limits for tasks assessed Yes/No Questions: Within Functional Limits Commands: Within Functional Limits Conversation: Simple Visual Recognition/Discrimination Discrimination: Not tested Reading Comprehension Reading Status: Not tested    Expression Expression Primary Mode of Expression: Verbal Verbal Expression Overall Verbal Expression: Other (comment) Initiation: No impairment Automatic Speech: Name;Social Response;Day of week Level of Generative/Spontaneous Verbalization: Conversation Repetition: No impairment Naming: No impairment Pragmatics: No impairment Interfering Components: Speech intelligibility Non-Verbal Means of Communication: Not applicable Written Expression Dominant Hand: Right Written Expression: Not tested   Oral / Motor  Oral Motor/Sensory Function Overall Oral Motor/Sensory Function: Moderate impairment Facial ROM: Reduced left;Suspected CN VII (facial) dysfunction Facial Symmetry: Abnormal symmetry left;Suspected CN VII (facial) dysfunction Facial Strength: Reduced left;Suspected CN VII (facial) dysfunction Lingual ROM: Within Functional Limits Lingual Symmetry: Within Functional Limits Lingual Strength: Within Functional Limits Mandible:  Within Functional Limits Motor Speech Overall Motor Speech: Impaired Respiration: Within functional limits Phonation: Normal Resonance: Within functional limits Articulation: Impaired Level of Impairment: Phrase Intelligibility: Intelligibility reduced Word: 75-100% accurate Phrase: 75-100% accurate Conversation: 50-74% accurate Motor Planning: Witnin functional limits Motor Speech Errors: Aware Interfering Components:  (? medication effect) Effective Techniques: Over-articulate;Pause;Slow rate   Thank you,  Genene Churn, [place]                     [place] 01/27/2021, 12:50 PM

## 2021-01-27 NOTE — NC FL2 (Deleted)
[place] LEVEL OF CARE SCREENING TOOL     IDENTIFICATION  Patient Name: Susan Davidson Birthdate: [birth year] Sex: female Admission Date (Current Location): 01/26/2021  [facility] and [place] Number:  [employer] and Address:  [facility] [address], [place]      Provider Number: [id number]  Attending Physician Name and Address:  Deatra James, MD  Relative Name and Phone Number:  Susan Davidson (Spouse)   [phone]    Current Level of Care: Hospital Recommended Level of Care: [place] Prior Approval Number:    Date Approved/Denied:   PASRR Number:    Discharge Plan: SNF    Current Diagnoses: Patient Active Problem List   Diagnosis Date Noted  . Generalized weakness 01/27/2021  . Spinal stenosis at L4-L5 level 01/27/2021  . Ambulatory dysfunction 01/26/2021  . Tobacco use disorder 01/26/2021  . CKD (chronic kidney disease) 11/15/2020  . B12 deficiency 11/09/2020  . Urinary frequency 11/02/2018  . Daytime somnolence 10/19/2012  . Impaired glucose tolerance 08/27/2011  . Preventative health care 08/27/2011  . Vitamin D deficiency 04/14/2010  . HYPERSOMNIA 02/19/2009  . Hypothyroidism 02/12/2008  . HLD (hyperlipidemia) 02/12/2008  . ANXIETY 02/12/2008  . Depression 02/12/2008  . Essential hypertension 02/12/2008  . OSTEOPENIA 02/12/2008    Orientation RESPIRATION BLADDER Height & Weight     Place,Self,Situation  Normal Continent,External catheter Weight: 145 lb (65.8 kg) Height:  '5\' 4"'$  (162.6 cm)  BEHAVIORAL SYMPTOMS/MOOD NEUROLOGICAL BOWEL NUTRITION STATUS      Continent Diet (regular)  AMBULATORY STATUS COMMUNICATION OF NEEDS Skin   Extensive Assist Verbally Normal                       Personal Care Assistance Level of Assistance  Bathing,Feeding,Dressing,Total care Bathing Assistance: Maximum assistance Feeding assistance: Independent Dressing Assistance: Maximum  assistance Total Care Assistance: Maximum assistance   Functional Limitations Info  Sight,Hearing,Speech Sight Info: Adequate Hearing Info: Adequate Speech Info: Adequate    SPECIAL CARE FACTORS FREQUENCY  PT (By licensed PT),OT (By licensed OT)     PT Frequency: 5 times weekly OT Frequency: 5 times weekly            Contractures Contractures Info: Not present    Additional Factors Info  Code Status,Allergies Code Status Info: FULL Allergies Info: Fire ant venom, epinephrine, influenza vac split quad           Current Medications (01/27/2021):  This is the current hospital active medication list Current Facility-Administered Medications  Medication Dose Route Frequency Provider Last Rate Last Admin  . acetaminophen (TYLENOL) tablet 650 mg  650 mg Oral Q4H PRN Zierle-Ghosh, Asia B, DO       Or  . acetaminophen (TYLENOL) 160 MG/5ML solution 650 mg  650 mg Per Tube Q4H PRN Zierle-Ghosh, Asia B, DO       Or  . acetaminophen (TYLENOL) suppository 650 mg  650 mg Rectal Q4H PRN Zierle-Ghosh, Asia B, DO      . ALPRAZolam (XANAX) tablet 0.5 mg  0.5 mg Oral BID PRN Zierle-Ghosh, Asia B, DO   0.5 mg at 01/26/21 2347  . aspirin tablet 325 mg  325 mg Oral Daily Shahmehdi, Seyed A, MD      . heparin injection 5,000 Units  5,000 Units Subcutaneous Q8H Zierle-Ghosh, Asia B, DO   5,000 Units at 01/27/21 0547  . levothyroxine (SYNTHROID) tablet 50 mcg  50 mcg Oral Q0600 Zierle-Ghosh, [place] B,  DO   50 mcg at 01/27/21 0547  . lisinopril (ZESTRIL) tablet 10 mg  10 mg Oral Daily Zierle-Ghosh, Asia B, DO   10 mg at 01/27/21 0854  . ondansetron (ZOFRAN) injection 4 mg  4 mg Intravenous Q6H PRN Zierle-Ghosh, Asia B, DO      . PARoxetine (PAXIL) tablet 20 mg  20 mg Oral Daily Zierle-Ghosh, Asia B, DO   20 mg at 01/27/21 0854  . pravastatin (PRAVACHOL) tablet 40 mg  40 mg Oral q1800 Shahmehdi, Seyed A, MD      . risperiDONE (RISPERDAL) tablet 1 mg  1 mg Oral BID Zierle-Ghosh, Asia B, DO   1 mg at  01/27/21 0854  . vitamin B-12 (CYANOCOBALAMIN) tablet 1,000 mcg  1,000 mcg Oral Daily Zierle-Ghosh, Asia B, DO   1,000 mcg at 01/27/21 [id number]     Discharge Medications: Please see discharge summary for a list of discharge medications.  Relevant Imaging Results:  Relevant Lab Results:   Additional Information SSN: [id number], [place]

## 2021-01-27 NOTE — Plan of Care (Signed)
  Problem: Acute Rehab PT Goals(only PT should resolve) Goal: Pt Will Go Supine/Side To Sit Outcome: Progressing Flowsheets (Taken 01/27/2021 1418) Pt will go Supine/Side to Sit:  with minimal assist  with moderate assist Goal: Patient Will Transfer Sit To/From Stand Outcome: Progressing Flowsheets (Taken 01/27/2021 1418) Patient will transfer sit to/from stand: with moderate assist Goal: Pt Will Transfer Bed To Chair/Chair To Bed Outcome: Progressing Flowsheets (Taken 01/27/2021 1418) Pt will Transfer Bed to Chair/Chair to Bed: with mod assist Goal: Pt Will Ambulate Outcome: Progressing Flowsheets (Taken 01/27/2021 1418) Pt will Ambulate:  15 feet  with moderate assist  with rolling walker   2:19 PM, 01/27/21 Lonell Grandchild, MPT Physical Therapist with [facility] [phone] office [phone] mobile phone

## 2021-01-27 NOTE — Plan of Care (Signed)
  Problem: SLP Cognition Goals Goal: Patient will utilize external memory aids Description: Patient will utilize external memory aids to facilitate recall of information for improved safety with Flowsheets (Taken 01/27/2021 1249) Patient will utilize external memory aids to facilitate recall of ____ information for improved safety with ______:  basic  min assist   Problem: SLP Language Goals Goal: Patient will utilize speech intelligibility Description: Patient will utilize speech intelligibility strategies to  enhance communication with Flowsheets (Taken 01/27/2021 1249) Patient will utilize speech intelligibility strategies to enhance communication with ____: min assist Goal: Patient will communicate needs/wants with Flowsheets (Taken 01/27/2021 1249) Patient will communicate ____  needs/wants with:  complex  min assist   Thank you,  Genene Churn, CCC-SLP [phone]

## 2021-01-27 NOTE — Plan of Care (Signed)
  Problem: Acute Rehab OT Goals (only OT should resolve) Goal: Pt. Will Perform Eating Flowsheets (Taken 01/27/2021 1043) Pt Will Perform Eating:  with supervision  sitting Goal: Pt. Will Perform Grooming Flowsheets (Taken 01/27/2021 1043) Pt Will Perform Grooming:  with min assist  sitting Goal: Pt. Will Perform Lower Body Bathing Flowsheets (Taken 01/27/2021 1043) Pt Will Perform Lower Body Bathing:  with mod assist  bed level  sitting/lateral leans Goal: Pt. Will Perform Upper Body Dressing Flowsheets (Taken 01/27/2021 1043) Pt Will Perform Upper Body Dressing:  with supervision  sitting  bed level Goal: Pt. Will Perform Lower Body Dressing Flowsheets (Taken 01/27/2021 1043) Pt Will Perform Lower Body Dressing:  with mod assist  with max assist  sitting/lateral leans  bed level  with adaptive equipment Goal: Pt. Will Transfer To Toilet Flowsheets (Taken 01/27/2021 1043) Pt Will Transfer to Toilet:  with min assist  with mod assist  stand pivot transfer  grab bars Goal: Pt. Will Perform Toileting-Clothing Manipulation Flowsheets (Taken 01/27/2021 1043) Pt Will Perform Toileting - Clothing Manipulation and hygiene:  with min assist  with mod assist  with adaptive equipment  sitting/lateral leans Goal: Pt/Caregiver Will Perform Home Exercise Program Flowsheets (Taken 01/27/2021 1043) Pt/caregiver will Perform Home Exercise Program:  Increased ROM  Increased strength  Both right and left upper extremity  With Supervision  Odile Veloso OT, MOT

## 2021-01-28 ENCOUNTER — Inpatient Hospital Stay (HOSPITAL_COMMUNITY): Payer: Medicare HMO

## 2021-01-28 DIAGNOSIS — I6389 Other cerebral infarction: Secondary | ICD-10-CM

## 2021-01-28 LAB — T4, FREE: Free T4: 0.66 ng/dL (ref 0.61–1.12)

## 2021-01-28 LAB — ECHOCARDIOGRAM COMPLETE
Area-P 1/2: 4.12 cm2
Height: 64 in
Weight: 2320 oz

## 2021-01-28 MED ORDER — PERFLUTREN LIPID MICROSPHERE
1.0000 mL | INTRAVENOUS | Status: AC | PRN
Start: 2021-01-28 — End: 2021-01-28

## 2021-01-28 MED ORDER — DOXYCYCLINE HYCLATE 100 MG PO TABS
100.0000 mg | ORAL_TABLET | Freq: Two times a day (BID) | ORAL | Status: DC
  Administered 2021-01-28 – 2021-01-29 (×4): 100 mg via ORAL
  Filled 2021-01-28 (×4): qty 1

## 2021-01-28 MED ORDER — LEVOTHYROXINE SODIUM 100 MCG PO TABS
100.0000 ug | ORAL_TABLET | Freq: Every day | ORAL | Status: DC
  Administered 2021-01-29 – 2021-01-30 (×2): 100 ug via ORAL
  Filled 2021-01-28 (×2): qty 1

## 2021-01-28 NOTE — TOC Progression Note (Signed)
RE: Susan Davidson Date of Birth: [birth year] Date: 01/28/2021  MUST ID: [id number]  To Whom It May Concern:   Please be advised that the above name patient will require a short-term nursing home stay-- anticipated 30 days or less rehabilitation and strengthening. The plan is for return home.

## 2021-01-28 NOTE — Progress Notes (Signed)
During assessment, this RN found a tick in pt's head.  Per Agricultural consultant, this RN could remove tick and place in sterile cup. This RN used tweezers to remove tick. Tick was still alive after removal. Pt had no complants during or after removal. Pt did inform this RN that she had a tick in her groin area about 2 months ago that had to be removed by fire.   Pt was more awake, talking and able to use bed features to move her head up and down, along with drinking her water until she asked for her Xanax. Per pt, she takes this at home due to her being "very depressed". Pt also stated that "If I don't get it, I'll die!" After Xanax was given, pt was able to go to sleep.

## 2021-01-28 NOTE — Progress Notes (Signed)
  Echocardiogram 2D Echocardiogram has been performed.  Susan Davidson 01/28/2021, 10:52 AM

## 2021-01-28 NOTE — Progress Notes (Signed)
PROGRESS NOTE    Patient: Susan Davidson                            PCP: Biagio Borg, MD                    DOB: 21-Apr-1951            DOA: 01/26/2021 [id number]             DOS: 01/28/2021, 10:35 AM   LOS: 1 day   Date of Service: The patient was seen and examined on 01/28/2021  Subjective:   The patient was seen and examined this morning, continue to have dense hemiparesis on the left side.  Mildly dysarthric able to communicate No progression of her symptoms.  Awake alert following commands  Brief Narrative:   Susan Davidson  is a 71 y.o. female, with history of vitamin D deficiency, vitamin B12 deficiency, hypothyroidism, hypertension, hyperlipidemia, psych disorder, and more presents to ED with a chief complaint of severe generalized weakness, unable to ambulate acutely. Sever left sided weakness ... Per husband her symptoms started on Monday.  Patient is a very poor historian but able to communicate.  Stating she started feeling weak and unable to walk for past few days.  She also had a fall she does not remember the details.  On admission she was somewhat delirious stating that he might of been due to her COVID shot that she received in January 2021   Patient does smoke half a pack per day.  She drinks 1 shot of rum every night.  She does not use illicit drugs.  She was vaccinated x1 for COVID.  Patient is full code.   In the ED Temp 97.9, heart rate 86-1 40, respiratory rate 20, blood pressure 130/105, satting at 98% on room air CT head shows no acute intracranial abnormalities.  Mild chronic small vessel ischemia.  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 EKG shows a heart rate of 86, QTc 488, sinus rhythm No leukocytosis with a white blood cell count 10.9, hemoglobin 14.5, platelets 200 Chemistry panel was mostly unremarkable with a creatinine that is at her baseline of 1.22 CT lumbar was  pending    Assessment & Plan:    Principal Problem:   Ambulatory dysfunction Active Problems:   Generalized weakness   Spinal stenosis at L4-L5 level   Hypothyroidism   Vitamin D deficiency   Essential hypertension   B12 deficiency   Tobacco use disorder   CVA (cerebral vascular accident) ([place])   1. Acute CVA -with dense left-sided weakness with dysarthria 1. Very subtle left upper and lower extremity weakness 2. CT of the head revealed chronic microvascular disease, and remote  appearing lacunar  infarcts in the right caudate, deep basal ganglia, and right cerebellum. 3. MRA/MRI, CT angiogram completed-reviewed in detail with on-call neurologist 4. Per neurology looking like cryptogenic event, recommending dual antiplatelet treatment for 1 month and single agent going forward also recommending 30-day cardiac event monitoring.  5. Echocardiogram >>>  6. Status post PT OT evaluation, recommending SNF versus home with home health 7. Monitor on telemetry 8. Carotid ultrasound revealed 50% stenosis bilaterally, with possible plaque on the left internal carotid arteries, recommended CT angiogram, will proceed with MRI of head and neck 9. Continue high-dose statins, aspirin 10. LDL 104, HDL 56, total cholesterol 184 11. Consulted neurology  2. B12  deficiency 1. Continue B12 supplementation 2. B12 normal at 509  3. Vitamin D deficiency 1. History of vitamin D deficiency 2. Check vitamin D  4. Tobacco use disorder 1. Advised on the importance of cessation  5. Thyroid disease 1. Continue Synthroid..  2. TSH elevated 26.362 3. Obtaining free T3-T4  6. Hypertension 1. Holding home BP meds allowing permissive hypertension for now  7. Psychiatric disorder 1. Holding Adderall 2. Continue Paxil and Risperdal 3. Xanax only as needed 4. Stable, no behavior changes  8.  Incidental finding of tick in her hair  1.  Prophylactic doxycycline  initiated     --------------------------------------------------------------------------------------------------------------------------------------------- Nutritional status:  The patient's BMI is: Body mass index is 24.89 kg/m. I agree with the assessment and plan as outlined  Skin Assessment: I have examined the patient's skin and I agree with the wound assessment as performed by wound care team As outlined belowe:   --------------------------------------------------------------------------------------------------------------------------------------------- Cultures; None   Antimicrobials: None    Consultants: Neurology  ------------------------------------------------------------------------------------------------------------------------------------------------  DVT prophylaxis:  SCD/Compression stockings Code Status:   Code Status: Full Code  Family Communication: Cussed with her husband over the phone in detail 01/27/21 The above findings and plan of care has been discussed with patient (and family)  in detail,  they expressed understanding and agreement of above. -Advance care planning has been discussed.   Admission status:   Status is: Inpatient  Meets the inpatient criteria > greater than 2 midnight stay for stroke work-up.   Dispo: The patient is from: Home              Anticipated d/c is to: SNF  VS Home with Home health               Patient currently is not medically stable to d/c.   Difficult to place patient No      Level of care: Telemetry   Procedures:   No admission procedures for hospital encounter.     Antimicrobials:  Anti-infectives (From admission, onward)   Start     Dose/Rate Route Frequency Ordered Stop   01/28/21 0800  doxycycline (VIBRA-TABS) tablet 100 mg        100 mg Oral Every 12 hours 01/28/21 0714 02/07/21 0959       Medication:  . aspirin  325 mg Oral Daily  . doxycycline  100 mg Oral Q12H  . heparin  5,000  Units Subcutaneous Q8H  . levothyroxine  50 mcg Oral Q0600  . PARoxetine  20 mg Oral Daily  . pravastatin  40 mg Oral q1800  . risperiDONE  1 mg Oral BID  . vitamin B-12  1,000 mcg Oral Daily    acetaminophen **OR** acetaminophen (TYLENOL) oral liquid 160 mg/5 mL **OR** acetaminophen, ALPRAZolam, ondansetron (ZOFRAN) IV   Objective:   Vitals:   01/27/21 2200 01/28/21 0234 01/28/21 0300 01/28/21 0725  BP: (!) 154/75 (!) 163/125 (!) 147/73 (!) 155/105  Pulse: 82 80  85  Resp: '15 16  16  '$ Temp: 98.2 F (36.8 C) 98.4 F (36.9 C)  98 F (36.7 C)  TempSrc:    Oral  SpO2: 96% 95%  95%  Weight:      Height:        Intake/Output Summary (Last 24 hours) at 01/28/2021 1035 Last data filed at 01/28/2021 0912 Gross per 24 hour  Intake 10 ml  Output 1000 ml  Net -990 ml   Filed Weights   01/26/21 1723  Weight: 65.8 kg  Examination:      Physical Exam:   General:  Alert, oriented, cooperative, no distress; mild dysarthria,  HEENT:   Asymmetric facial features, left-sided droop . normocephalic, PERRL, otherwise with in Normal limits   Neuro:  CNII-XII intact. , normal motor and sensation, reflexes intact   Lungs:   Clear to auscultation BL, Respirations unlabored, no wheezes / crackles  Cardio:    S1/S2, RRR, No murmure, No Rubs or Gallops   Abdomen:   Soft, non-tender, bowel sounds active all four quadrants,  no guarding or peritoneal signs.  Muscular skeletal:   Left-sided weakness upper and lower extremity, sensation intact Limited exam - in bed, , Normal strength in the right upper and lower extremity 2+ pulses,  symmetric, No pitting edema  Skin:  Dry, warm to touch, negative for any Rashes,  Wounds: Please see nursing documentation            ------------------------------------------------------------------------------------------------------------------------------------------    LABs:  CBC Latest Ref Rng & Units 01/26/2021 11/09/2020 03/11/2020  WBC 4.0 -  10.5 K/uL 10.9(H) 7.5 6.2  Hemoglobin 12.0 - 15.0 g/dL 14.5 13.8 15.6(H)  Hematocrit 36.0 - 46.0 % 45.5 41.8 47.2(H)  Platelets 150 - 400 K/uL 200 165.0 235.0   CMP Latest Ref Rng & Units 01/26/2021 11/09/2020 03/11/2020  Glucose 70 - 99 mg/dL 117(H) 100(H) 120(H)  BUN 8 - 23 mg/dL '19 20 16  '$ Creatinine 0.44 - 1.00 mg/dL 1.22(H) 1.39(H) 1.26(H)  Sodium 135 - 145 mmol/L 137 139 137  Potassium 3.5 - 5.1 mmol/L 4.1 4.8 4.0  Chloride 98 - 111 mmol/L 104 99 96  CO2 22 - 32 mmol/L 24 32 30  Calcium 8.9 - 10.3 mg/dL 9.0 10.0 9.6  Total Protein 6.5 - 8.1 g/dL 7.5 7.7 7.8  Total Bilirubin 0.3 - 1.2 mg/dL 1.0 0.4 0.6  Alkaline Phos 38 - 126 U/L 48 42 59  AST 15 - 41 U/L '21 20 22  '$ ALT 0 - 44 U/L '18 14 12       '$ Micro Results Recent Results (from the past 240 hour(s))  Resp Panel by RT-PCR (Flu A&B, Covid) Nasopharyngeal Swab     Status: None   Collection Time: 01/26/21  8:05 PM   Specimen: Nasopharyngeal Swab; Nasopharyngeal(NP) swabs in vial transport medium  Result Value Ref Range Status   SARS Coronavirus 2 by RT PCR NEGATIVE NEGATIVE Final    Comment: (NOTE) SARS-CoV-2 target nucleic acids are NOT DETECTED.  The SARS-CoV-2 RNA is generally detectable in upper respiratory specimens during the acute phase of infection. The lowest concentration of SARS-CoV-2 viral copies this assay can detect is 138 copies/mL. A negative result does not preclude SARS-Cov-2 infection and should not be used as the sole basis for treatment or other patient management decisions. A negative result may occur with  improper specimen collection/handling, submission of specimen other than nasopharyngeal swab, presence of viral mutation(s) within the areas targeted by this assay, and inadequate number of viral copies(<138 copies/mL). A negative result must be combined with clinical observations, patient history, and epidemiological information. The expected result is Negative.  Fact Sheet for Patients:   EntrepreneurPulse.com.au  Fact Sheet for Healthcare Providers:  IncredibleEmployment.be  This test is no t yet approved or cleared by the [place] FDA and  has been authorized for detection and/or diagnosis of SARS-CoV-2 by FDA under an Emergency Use Authorization (EUA). This EUA will remain  in effect (meaning this test can be used) for the duration of the COVID-19 declaration  under Section 564(b)(1) of the Act, 21 U.S.C.section 360bbb-3(b)(1), unless the authorization is terminated  or revoked sooner.       Influenza A by PCR NEGATIVE NEGATIVE Final   Influenza B by PCR NEGATIVE NEGATIVE Final    Comment: (NOTE) The Xpert Xpress SARS-CoV-2/FLU/RSV plus assay is intended as an aid in the diagnosis of influenza from Nasopharyngeal swab specimens and should not be used as a sole basis for treatment. Nasal washings and aspirates are unacceptable for Xpert Xpress SARS-CoV-2/FLU/RSV testing.  Fact Sheet for Patients: EntrepreneurPulse.com.au  Fact Sheet for Healthcare Providers: IncredibleEmployment.be  This test is not yet approved or cleared by the [place] FDA and has been authorized for detection and/or diagnosis of SARS-CoV-2 by FDA under an Emergency Use Authorization (EUA). This EUA will remain in effect (meaning this test can be used) for the duration of the COVID-19 declaration under Section 564(b)(1) of the Act, 21 U.S.C. section 360bbb-3(b)(1), unless the authorization is terminated or revoked.  Performed at [facility], [address] [place], [place] [address]     Radiology Reports CT [facility] HEAD NECK W WO CM  Result Date: 01/27/2021 CLINICAL DATA:  Weakness with difficulty walking. Acute right ACA infarct on MRI. EXAM: CT ANGIOGRAPHY HEAD AND NECK TECHNIQUE: Multidetector CT imaging of the head and neck was performed using the standard protocol during bolus administration of  intravenous contrast. Multiplanar CT image reconstructions and MIPs were obtained to evaluate the vascular anatomy. Carotid stenosis measurements (when applicable) are obtained utilizing NASCET criteria, using the distal internal carotid diameter as the denominator. CONTRAST:  24m OMNIPAQUE IOHEXOL 350 MG/ML SOLN COMPARISON:  Head MRI and head MRA 01/27/2021 FINDINGS: CTA NECK FINDINGS Aortic arch: Incomplete imaging of the aortic arch with exclusion of the brachiocephalic artery origin. The included portions of the brachiocephalic and subclavian arteries are patent with calcified and soft plaque in the subclavian arteries not resulting in significant stenosis. Right carotid system: Patent with a moderate amount of predominantly calcified plaque at the carotid bifurcation. No evidence of a significant stenosis or dissection. Left carotid system: Patent with prominently calcified plaque at the carotid bifurcation. No evidence of a significant stenosis or dissection. Vertebral arteries: Patent with calcified plaque at the right vertebral artery origin. No evidence of a significant stenosis or dissection. Mildly dominant left vertebral artery. Skeleton: Asymmetrically severe left TMJ arthropathy. Mild cervical disc degeneration. Advanced left facet arthrosis at C2-3. Other neck: No evidence of cervical lymphadenopathy or mass. Upper chest: No apical lung consolidation or mass. Review of the MIP images confirms the above findings CTA HEAD FINDINGS Anterior circulation: The internal carotid arteries are patent from skull base to carotid termini with a small amount of calcified plaque bilaterally not resulting in significant stenosis. ACAs and MCAs are patent with moderate distal ACA branch vessel irregular narrowing but no evidence of a proximal branch occlusion or significant proximal stenosis. A superiorly projecting aneurysm from the anterior communicating artery/right A1-A2 junction measures 5 x 4 mm. Posterior  circulation: The intracranial vertebral arteries are widely patent to the basilar. Patent PICA and SCA origins are visualized bilaterally. The basilar artery is widely patent. There is a large left posterior communicating artery with markedly hypoplastic left P1 segment. The 2 mm outpouching at the left P1 origin on today's MRA has an appearance of an infundibulum on CTA rather than an aneurysm. Both PCAs are patent with atherosclerotic type irregularity including mild distal right P2 and bilateral P3 stenoses. The appearance of more severe PCA  stenoses on MRA was likely due to artifactual signal loss. Venous sinuses: Patent. Anatomic variants: None. Review of the MIP images confirms the above findings IMPRESSION: 1. Intracranial atherosclerosis primarily infecting the small branch vessels. No large vessel occlusion. 2. 5 mm anterior communicating aneurysm. 3. Cervical carotid artery atherosclerosis without significant stenosis. Electronically Signed   By: Logan Bores M.D.   On: 01/27/2021 20:40   CT Head Wo Contrast  Result Date: 01/26/2021 CLINICAL DATA:  Delirium.  Three falls this week due to weakness. EXAM: CT HEAD WITHOUT CONTRAST TECHNIQUE: Contiguous axial images were obtained from the base of the skull through the vertex without intravenous contrast. COMPARISON:  None. FINDINGS: Brain: No evidence of acute infarction, hemorrhage, hydrocephalus, extra-axial collection or mass lesion/mass effect. Mild periventricular and deep white matter hypodensity typical of chronic small vessel ischemia. 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 Vascular: No hyperdense vessel. Skull: No fracture or focal lesion. Sinuses/Orbits: Sclerosis involving the left mandibular condyle is likely related to degenerative change at the temporomandibular joint. Paranasal sinuses are clear. No mastoid effusion. No acute orbital abnormality. Other: None. IMPRESSION: 1. No acute  intracranial abnormality. 2. Mild chronic small vessel ischemia. 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 Electronically Signed   By: Keith Rake M.D.   On: 01/26/2021 18:59   CT Lumbar Spine Wo Contrast  Result Date: 01/26/2021 CLINICAL DATA:  Initial evaluation for acute weakness, inability to walk. EXAM: CT LUMBAR SPINE WITHOUT CONTRAST TECHNIQUE: Multidetector CT imaging of the lumbar spine was performed without intravenous contrast administration. Multiplanar CT image reconstructions were also generated. COMPARISON:  None available. FINDINGS: Segmentation: Standard. Lowest well-formed disc space labeled the L5-S1 level. Alignment: Mild levoscoliosis. 3 mm retrolisthesis of L1 on L2, with 8 mm anterolisthesis of L4 on L5. Findings chronic and facet mediated. No pars defect. Vertebrae: Vertebral body height maintained without acute or chronic fracture. Visualized sacrum and pelvis intact. SI joints approximated symmetric. No discrete or worrisome osseous lesions. Paraspinal and other soft tissues: Paraspinous soft tissues demonstrate no acute finding. Moderate aorto bi-iliac atherosclerotic disease. Visualized visceral structures otherwise unremarkable. Disc levels: L1-2: 3 mm retrolisthesis. Mild annular disc bulge with bilateral facet hypertrophy. No significant spinal stenosis. Mild to moderate bilateral L1 foraminal narrowing. L2-3: Minimal annular disc bulge. Moderate right worse than left facet hypertrophy. No significant spinal stenosis. Mild to moderate bilateral L2 foraminal stenosis. L3-4: Mild disc bulge, slightly eccentric to the right. Severe bilateral facet arthrosis. Resultant mild canal with bilateral subarticular stenosis. Moderate bilateral L3 foraminal narrowing. L4-5: 8 mm anterolisthesis. Associated broad posterior pseudo disc bulge/uncovering, eccentric to the right. Severe bilateral facet arthrosis with ligament flavum  hypertrophy. Resultant severe canal with bilateral subarticular stenosis. Moderate to severe bilateral L4 foraminal narrowing, left worse than right. L5-S1: Minimal disc bulge. Moderate left worse than right facet hypertrophy. No spinal stenosis. Foramina remain patent. IMPRESSION: 1. No acute abnormality within the lumbar spine. 2. 8 mm anterolisthesis of L4 on L5 with resultant severe spinal stenosis, with moderate to severe bilateral L4 foraminal narrowing, left worse than right. 3. Additional multifactorial degenerative changes at L1-2, L2-3, L3-4, and L5-S1 with resultant mild to moderate bilateral L1, L2, and L3 foraminal stenosis as above. 4. Aortic Atherosclerosis (ICD10-I70.0). Electronically Signed   By: Jeannine Boga M.D.   On: 01/26/2021 21:16   MR ANGIO HEAD WO CONTRAST  Result Date: 01/27/2021 CLINICAL DATA:  Severe left-sided weakness.  Inability to ambulate. EXAM: MRI HEAD WITHOUT AND WITH CONTRAST MRA HEAD WITHOUT CONTRAST TECHNIQUE: Multiplanar, multiecho pulse sequences of the brain and surrounding structures were obtained without and with intravenous contrast. Angiographic images of the head were obtained using MRA technique without contrast. CONTRAST:  6.79m GADAVIST GADOBUTROL 1 MMOL/ML IV SOLN COMPARISON:  Head CT 01/26/2021 FINDINGS: MRI HEAD FINDINGS Brain: There is a moderate-sized acute distal right ACA territory infarct involving the body and splenium of the corpus callosum, right cingulate gyrus, and parasagittal right frontal and parietal lobes. Susceptibility artifact limits assessment of the posterior left cerebrum and cerebellum, mainly on diffusion-weighted and FLAIR sequences. T2 hyperintensities in the cerebral white matter bilaterally are nonspecific but compatible with mild chronic small vessel ischemic disease. Chronic lacunar infarcts are present in the bilateral basal ganglia, thalami, and cerebellum. There is mild cerebral atrophy. No abnormal enhancement is  identified. Vascular: Major intracranial arterial flow voids are preserved. The major dural venous sinuses are enhancing. Skull and upper cervical spine: Unremarkable bone marrow signal. Sinuses/Orbits: Unremarkable orbits. Paranasal sinuses and mastoid air cells are clear. Other: None. MRA HEAD FINDINGS The visualized distal vertebral arteries are widely patent to the basilar with the left being slightly dominant. Patent PICA and SCA origins are identified bilaterally. The basilar artery is widely patent. There is a fetal type origin of the left PCA. A 2 mm outpouching from the distal basilar artery in the expected region of the left P1 origin may represent an aneurysm or the stump of an occluded P1 segment. There are severe distal right P2 and bilateral P3 stenoses. The internal carotid arteries are patent from skull base to carotid termini. The cavernous segments are tortuous with atherosclerotic type irregularity, however no significant ICA stenosis is evident. ACAs and MCAs are patent with mild branch vessel irregularity but no evidence of a proximal branch occlusion or flow limiting A1 or M1 stenosis. An aneurysm projecting superiorly from the anterior communicating artery/right A1-A2 junction region measures 5 x 4 mm IMPRESSION: 1. Moderate-sized acute distal right ACA territory infarct. 2. 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 3. Intracranial atherosclerosis without large vessel occlusion in the anterior circulation. 4. 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 5. 2 mm outpouching from the distal basilar artery which may represent an aneurysm versus the stump of an occluded left P1 segment. 6. 5 mm anterior communicating aneurysm. Electronically Signed   By: ALogan BoresM.D.   On: 01/27/2021 14:38   MR BRAIN W WO CONTRAST  Result Date: 01/27/2021 CLINICAL DATA:  Severe left-sided weakness.  Inability to ambulate.  EXAM: MRI HEAD WITHOUT AND WITH CONTRAST MRA HEAD WITHOUT CONTRAST TECHNIQUE: Multiplanar, multiecho pulse sequences of the brain and surrounding structures were obtained without and with intravenous contrast. Angiographic images of the head were obtained using MRA technique without contrast. CONTRAST:  6.568mGADAVIST GADOBUTROL 1 MMOL/ML IV SOLN COMPARISON:  Head CT 01/26/2021 FINDINGS: MRI HEAD FINDINGS Brain: There is a moderate-sized acute distal right ACA territory infarct involving the body and splenium of the corpus callosum, right cingulate gyrus, and parasagittal right frontal and parietal lobes. Susceptibility artifact limits assessment of the posterior left cerebrum and cerebellum, mainly on diffusion-weighted and FLAIR sequences. T2 hyperintensities in the cerebral white matter bilaterally are nonspecific but compatible with mild chronic small vessel ischemic disease. Chronic lacunar infarcts are present in the bilateral basal ganglia, thalami, and cerebellum. There is mild cerebral atrophy. No  abnormal enhancement is identified. Vascular: Major intracranial arterial flow voids are preserved. The major dural venous sinuses are enhancing. Skull and upper cervical spine: Unremarkable bone marrow signal. Sinuses/Orbits: Unremarkable orbits. Paranasal sinuses and mastoid air cells are clear. Other: None. MRA HEAD FINDINGS The visualized distal vertebral arteries are widely patent to the basilar with the left being slightly dominant. Patent PICA and SCA origins are identified bilaterally. The basilar artery is widely patent. There is a fetal type origin of the left PCA. A 2 mm outpouching from the distal basilar artery in the expected region of the left P1 origin may represent an aneurysm or the stump of an occluded P1 segment. There are severe distal right P2 and bilateral P3 stenoses. The internal carotid arteries are patent from skull base to carotid termini. The cavernous segments are tortuous with  atherosclerotic type irregularity, however no significant ICA stenosis is evident. ACAs and MCAs are patent with mild branch vessel irregularity but no evidence of a proximal branch occlusion or flow limiting A1 or M1 stenosis. An aneurysm projecting superiorly from the anterior communicating artery/right A1-A2 junction region measures 5 x 4 mm IMPRESSION: 1. Moderate-sized acute distal right ACA territory infarct. 2. 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 3. Intracranial atherosclerosis without large vessel occlusion in the anterior circulation. 4. 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 5. 2 mm outpouching from the distal basilar artery which may represent an aneurysm versus the stump of an occluded left P1 segment. 6. 5 mm anterior communicating aneurysm. Electronically Signed   By: Logan Bores M.D.   On: 01/27/2021 14:38   US Carotid Bilateral (at [facility] and [facility] only)  Result Date: 01/27/2021 CLINICAL DATA:  Ambulatory dysfunction Hypertension Hyperlipidemia Tobacco use EXAM: BILATERAL CAROTID DUPLEX ULTRASOUND TECHNIQUE: Pearline Cables scale imaging, color Doppler and duplex ultrasound were performed of bilateral carotid and vertebral arteries in the neck. COMPARISON:  None. FINDINGS: Criteria: Quantification of carotid stenosis is based on velocity parameters that correlate the residual internal carotid diameter with NASCET-based stenosis levels, using the diameter of the distal internal carotid lumen as the denominator for stenosis measurement. The following velocity measurements were obtained: RIGHT ICA: 70/22 cm/sec CCA: [id number] cm/sec SYSTOLIC ICA/CCA RATIO:  1.5 ECA: 120 cm/sec LEFT ICA: 83/14 cm/sec CCA: [id number] cm/sec SYSTOLIC ICA/CCA RATIO:  1.2 ECA: 156 cm/sec RIGHT CAROTID ARTERY: Mild atheromatous plaque of the carotid bulb extending into the internal carotid artery origin velocity parameters indicative of less than 50%  stenosis. RIGHT VERTEBRAL ARTERY:  Antegrade flow. LEFT CAROTID ARTERY: Bulky heterogeneous plaque of the left carotid bifurcation with velocity parameters indicative of less than 50% stenosis. LEFT VERTEBRAL ARTERY:  Antegrade flow. IMPRESSION: Less than 50% stenosis of the internal carotid arteries. Degree of stenosis in the left internal carotid artery may be greater as bulky plaque at the origin may be obscuring higher velocities. Further evaluation with CT angiography of the neck would be beneficial. Electronically Signed   By: Miachel Roux M.D.   On: 01/27/2021 10:16    SIGNED: Deatra James, MD, FHM. [facility],  Pager (please use [email or web address] to page/text) Please use Epic Secure Chat for non-urgent communication (7AM-7PM)  If 7PM-7AM, please contact night-coverage [email or web address], 01/28/2021, 10:35 AM

## 2021-01-28 NOTE — Progress Notes (Signed)
  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 Pt with improved orientation to correct month and year. She used external cues for day and date (dry erase board in room). Speech intelligibility continues to be impaired, however improves with use of strategies (over articulation and increased vocal intensity). Pt implemented in functional/structured tasks with mod cues. Pt with reduced trunk and breath support which negatively impacts speech intelligibility. Pt passed the Yale swallow screen, however she did cough when drinking with straw sips while SLP was in the room. She was encouraged to take small sips and notify RN if coughing increases. SLP will follow in acute setting and f/u SLP services recommended at next level of care.   HPI HPI: Susan Davidson  is a 70 y.o. female, with history of vitamin D deficiency, vitamin B12 deficiency, hypothyroidism, hypertension, hyperlipidemia, psych disorder, and more presents to ED with a chief complaint.  Patient is a notably poor historian.  She reports that she had sudden onset of ambulatory dysfunction today.  She reports her last known well was yesterday.  She is not able to give more specific times than that.  She reports that when she tries to walk she falls to the left.  She reports that this is never happened before, but that she has had trouble with falling to the right in the past.  She reports that her trouble falling to the right started after she took the first COVID shot, and she thinks that this is related to that as well.  She did the first COVID shot in January 2021.  She denies any numbness or injury to her back or legs.  She reports that she did  fall to the ground and bumped her head when she first noted that she could not walk.  She did not have loss of consciousness.  She reports that she landed flat on her left side, but did not hit her hip or her knee.  She has no pain from the fall.  Patient reports that she has had decreased appetite today.  No dysphagia, no difficulty talking.  She reports no change in vision, and possible 2 days of muffled hearing.  She reports that she does have allergies.  Her last normal bowel movement was yesterday.  She has no other complaints at this time. SLE requested. MRI pending.      SLP Plan  Continue with current plan of care       Recommendations                   Follow up Recommendations: Skilled Nursing facility SLP Visit Diagnosis: Dysarthria and anarthria (R47.1);Cognitive communication deficit [phone]) Plan: Continue with current plan of care       Thank you,  Genene Churn, [place]                 [place] 01/28/2021, 2:27 PM

## 2021-01-29 ENCOUNTER — Telehealth: Payer: Self-pay | Admitting: Student

## 2021-01-29 LAB — T3, FREE: T3, Free: 1.3 pg/mL — ABNORMAL LOW (ref 2.0–4.4)

## 2021-01-29 MED ORDER — ASPIRIN EC 81 MG PO TBEC
81.0000 mg | DELAYED_RELEASE_TABLET | Freq: Every day | ORAL | Status: DC
  Administered 2021-01-29 – 2021-01-30 (×2): 81 mg via ORAL
  Filled 2021-01-29 (×2): qty 1

## 2021-01-29 MED ORDER — CLOPIDOGREL BISULFATE 75 MG PO TABS
75.0000 mg | ORAL_TABLET | Freq: Every day | ORAL | Status: DC
  Administered 2021-01-29 – 2021-01-30 (×2): 75 mg via ORAL
  Filled 2021-01-29 (×2): qty 1

## 2021-01-29 NOTE — Progress Notes (Signed)
Physical Therapy Treatment Patient Details Name: Susan Davidson MRN: [id number] DOB: [birth year] Today's Date: 01/29/2021    History of Present Illness Susan Davidson  is a 70 y.o. female, with history of vitamin D deficiency, vitamin B12 deficiency, hypothyroidism, hypertension, hyperlipidemia, psych disorder, and more presents to ED with a chief complaint.  Patient is a notably poor historian.  She reports that she had sudden onset of ambulatory dysfunction today.  She reports her last known well was yesterday.  She is not able to give more specific times than that.  She reports that when she tries to walk she falls to the left.  She reports that this is never happened before, but that she has had trouble with falling to the right in the past.  She reports that her trouble falling to the right started after she took the first COVID shot, and she thinks that this is related to that as well.  She did the first COVID shot in January 2021.  She denies any numbness or injury to her back or legs.  She reports that she did fall to the ground and bumped her head when she first noted that she could not walk.  She did not have loss of consciousness.  She reports that she landed flat on her left side, but did not hit her hip or her knee.  She has no pain from the fall.  Patient reports that she has had decreased appetite today.  No dysphagia, no difficulty talking.  She reports no change in vision, and possible 2 days of muffled hearing.  She reports that she does have allergies.  Her last normal bowel movement was yesterday.  She has no other complaints at this time.    PT Comments    Patient demonstrates slow labored movement for sitting up at bedside with limited use of LUE due to weakness, frequent leaning to the left while seated, able to use LUE to help balance self keeping trunk in midline for up to 2 minutes before leaning backwards or to the left, unable to lift LLE against gravity requiring active  assistance to complete LLE exercises, demonstrates slight improvement for using RW, but continues to have difficulty gripping with left hand.  Patient able to take a couple shuffling side steps with requiring tactile assistance to move LLE during transfer to chair.  Patient tolerated sitting up in chair after therapy with her spouse present in room - nursing staff notified.  Patient will benefit from continued physical therapy in hospital and recommended venue below to increase strength, balance, endurance for safe ADLs and gait.     Follow Up Recommendations  SNF     Equipment Recommendations  Other (comment) (to be determined)    Recommendations for Other Services       Precautions / Restrictions Precautions Precautions: Fall Restrictions Weight Bearing Restrictions: No    Mobility  Bed Mobility Overal bed mobility: Needs Assistance Bed Mobility: Supine to Sit     Supine to sit: Mod assist;Max assist     General bed mobility comments: slow labored movement with limited use of LUE due to weakness    Transfers Overall transfer level: Needs assistance Equipment used: Rolling walker (2 wheeled) Transfers: Sit to/from [facility] Sit to Stand: Mod assist;Max assist Stand pivot transfers: Max assist       General transfer comment: very unsteady on feet with buckling of LLE due to weakness, able to use RW to transfer to chair  Ambulation/Gait Ambulation/Gait  assistance: Max Web designer (Feet): 2 Feet Assistive device: Rolling walker (2 wheeled) Gait Pattern/deviations: Decreased step length - right;Decreased step length - left;Decreased stance time - left;Decreased stride length;Shuffle Gait velocity: slow   General Gait Details: limited to a couple of shuffling steps at bedside with tactile cueing to move LLE   Stairs             Wheelchair Mobility    Modified Rankin (Stroke Patients Only)       Balance Overall balance  assessment: Needs assistance Sitting-balance support: Feet supported;Bilateral upper extremity supported Sitting balance-Leahy Scale: Poor Sitting balance - Comments: seated at EOB Postural control: Left lateral lean Standing balance support: During functional activity;Bilateral upper extremity supported Standing balance-Leahy Scale: Poor Standing balance comment: using RW                            Cognition Arousal/Alertness: Awake/alert Behavior During Therapy: WFL for tasks assessed/performed Overall Cognitive Status: Within Functional Limits for tasks assessed                                        Exercises General Exercises - Lower Extremity Ankle Circles/Pumps: Seated;AROM;Strengthening;Right;10 reps Long Arc Quad: Seated;AROM;Strengthening;AAROM;Both;10 reps Hip Flexion/Marching: Seated;AROM;AAROM;Strengthening;Both;10 reps    General Comments        Pertinent Vitals/Pain Pain Assessment: No/denies pain    Home Living                      Prior Function            PT Goals (current goals can now be found in the care plan section) Acute Rehab PT Goals Patient Stated Goal: return home after rehab PT Goal Formulation: With patient/family Time For Goal Achievement: 02/10/21 Potential to Achieve Goals: Good Progress towards PT goals: Progressing toward goals    Frequency    Min 3X/week      PT Plan Current plan remains appropriate    Co-evaluation              AM-PAC PT "6 Clicks" Mobility   Outcome Measure  Help needed turning from your back to your side while in a flat bed without using bedrails?: A Lot Help needed moving from lying on your back to sitting on the side of a flat bed without using bedrails?: A Lot Help needed moving to and from a bed to a chair (including a wheelchair)?: A Lot Help needed standing up from a chair using your arms (e.g., wheelchair or bedside chair)?: A Lot Help needed to walk  in hospital room?: Total Help needed climbing 3-5 steps with a railing? : Total 6 Click Score: 10    End of Session   Activity Tolerance: Patient tolerated treatment well;Patient limited by fatigue Patient left: in chair;with call bell/phone within reach;with chair alarm set;with family/visitor present Nurse Communication: Mobility status PT Visit Diagnosis: Unsteadiness on feet (R26.81);Other abnormalities of gait and mobility (R26.89);Muscle weakness (generalized) (M62.81)     Time: [id number] PT Time Calculation (min) (ACUTE ONLY): 30 min  Charges:  $Therapeutic Exercise: 8-22 mins $Therapeutic Activity: 8-22 mins                     1:48 PM, 01/29/21 Lonell Grandchild, MPT Physical Therapist with [facility] 336 [phone] office [phone] mobile phone

## 2021-01-29 NOTE — Care Management Important Message (Signed)
Important Message  Patient Details  Name: Susan Davidson MRN: [id number] Date of Birth: [birth year]   Medicare Important Message Given:  Yes     Tommy Medal 01/29/2021, 12:37 PM

## 2021-01-29 NOTE — Progress Notes (Signed)
PROGRESS NOTE    Patient: Susan Davidson                            PCP: Biagio Borg, MD                    DOB: 03-03-1951            DOA: 01/26/2021 [id number]             DOS: 01/29/2021, 12:06 PM   LOS: 2 days   Date of Service: The patient was seen and examined on 01/29/2021  Subjective:   The patient was seen and examined this morning, comfortable in bed able to communicate, no issues overnight. Hemodynamically stable  Brief Narrative:   Susan Davidson  is a 70 y.o. female, with history of vitamin D deficiency, vitamin B12 deficiency, hypothyroidism, hypertension, hyperlipidemia, psych disorder, and more presents to ED with a chief complaint of severe generalized weakness, unable to ambulate acutely. Sever left sided weakness ... Per husband her symptoms started on Monday.  Patient is a very poor historian but able to communicate.  Stating she started feeling weak and unable to walk for past few days.  She also had a fall she does not remember the details.  On admission she was somewhat delirious stating that he might of been due to her COVID shot that she received in January 2021   Patient does smoke half a pack per day.  She drinks 1 shot of rum every night.  She does not use illicit drugs.  She was vaccinated x1 for COVID.  Patient is full code.   In the ED Temp 97.9, heart rate 86-1 40, respiratory rate 20, blood pressure 130/105, satting at 98% on room air CT head shows no acute intracranial abnormalities.  Mild chronic small vessel ischemia.  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 EKG shows a heart rate of 86, QTc 488, sinus rhythm No leukocytosis with a white blood cell count 10.9, hemoglobin 14.5, platelets 200 Chemistry panel was mostly unremarkable with a creatinine that is at her baseline of 1.22 CT lumbar was  pending    Assessment & Plan:   Principal Problem:   Ambulatory dysfunction Active Problems:    Generalized weakness   Spinal stenosis at L4-L5 level   Hypothyroidism   Vitamin D deficiency   Essential hypertension   B12 deficiency   Tobacco use disorder   CVA (cerebral vascular accident) ([facility])   1. Acute CVA -with dense left-sided weakness with dysarthria 1. Left upper and lower extremity weakness,  facial droop with dysarthria -no changes or progression of weakness noted... Today 2. CT of the head revealed chronic microvascular disease, and remote  appearing lacunar  infarcts in the right caudate, deep basal ganglia, and right cerebellum. 3. MRA/MRI, CT angiogram completed-reviewed in detail with on-call neurologist 4. Per neurology looking like cryptogenic event, recommending dual antiplatelet treatment for 1 month and single agent going forward also recommending 30-day cardiac event monitoring.  5. Echocardiogram >>> reviewed, E JF 65-70% normal LV function, mild LVH,  6. Status post PT OT evaluation, recommending SNF versus home with home health 7. Monitor on telemetry 8. Carotid ultrasound revealed 50% stenosis bilaterally, with possible plaque on the left internal carotid arteries, recommended CT angiogram, will proceed with MRI of head and neck 9. Continue high-dose statins, aspirin 10. LDL 104, HDL 56, total cholesterol 184  50. Consulted neurology -following closely, recommended dual antiplatelet treatment for 1 month then single agent treatment, also recommended 30-day cardiac event monitoring.   12. Cardiology notified to arrange 30-day cardiac event monitor...   2. B12 deficiency 1. Continue B12 supplementation 2. B12 normal at 509  3. Vitamin D deficiency 1. History of vitamin D deficiency 2. Check vitamin D  4. Tobacco use disorder 1. Advised on the importance of cessation  5. Thyroid disease 1. Continue Synthroid..  2. TSH elevated 26.362 3. Obtaining free T3-T4  6. Hypertension 1. Holding home BP meds allowing permissive hypertension for  now  7. Psychiatric disorder 1. Holding Adderall 2. Continue Paxil and Risperdal 3. Xanax only as needed 4. Stable, no behavior changes  8.  Incidental finding of tick in her hair  1.  Prophylactic doxycycline initiated     --------------------------------------------------------------------------------------------------------------------------------------------- Nutritional status:  The patient's BMI is: Body mass index is 25.81 kg/m. I agree with the assessment and plan as outlined  Skin Assessment: I have examined the patient's skin and I agree with the wound assessment as performed by wound care team As outlined belowe:   --------------------------------------------------------------------------------------------------------------------------------------------- Cultures; None   Antimicrobials: None    Consultants: Neurology  ------------------------------------------------------------------------------------------------------------------------------------------------  DVT prophylaxis:  SCD/Compression stockings Code Status:   Code Status: Full Code  Family Communication:  Discussed the finding in detail with Mr. Howery.... Last discussion 01/28/2021 at bedside all findings plan of care consultation and discharge planning were discussed in detail--- understanding and agreement with current plan..... We will reach out to him again today 01/29/2021 as planned remains SNF placement waiting [facility]   The above findings and plan of care has been discussed with patient (and family)  in detail,  they expressed understanding and agreement of above. -Advance care planning has been discussed.   Admission status:   Status is: Inpatient  Meets the inpatient criteria > greater than 2 midnight stay for stroke work-up.   Dispo: The patient is from: Home              Anticipated d/c is to: Planning to discuss charge to patient to SNF in 1 day as soon as insurance approves,  Nurse, children's..               Patient currently is not medically stable to d/c.   Difficult to place patient No      Level of care: Telemetry   Procedures:   No admission procedures for hospital encounter.     Antimicrobials:  Anti-infectives (From admission, onward)   Start     Dose/Rate Route Frequency Ordered Stop   01/28/21 0800  doxycycline (VIBRA-TABS) tablet 100 mg        100 mg Oral Every 12 hours 01/28/21 0714 02/07/21 0959       Medication:  . aspirin EC  81 mg Oral Daily  . clopidogrel  75 mg Oral Daily  . doxycycline  100 mg Oral Q12H  . heparin  5,000 Units Subcutaneous Q8H  . levothyroxine  100 mcg Oral Q0600  . PARoxetine  20 mg Oral Daily  . pravastatin  40 mg Oral q1800  . risperiDONE  1 mg Oral BID  . vitamin B-12  1,000 mcg Oral Daily    acetaminophen **OR** acetaminophen (TYLENOL) oral liquid 160 mg/5 mL **OR** acetaminophen, ALPRAZolam, ondansetron (ZOFRAN) IV   Objective:   Vitals:   01/28/21 2142 01/29/21 0203 01/29/21 0419 01/29/21 0440  BP: (!) 141/74 139/75  138/84  Pulse: 83 85  84  Resp: '18 18  16  '$ Temp: 98.5 F (36.9 C) 98.7 F (37.1 C)  98.6 F (37 C)  TempSrc:    Oral  SpO2: 97% 95%  94%  Weight:   68.2 kg   Height:        Intake/Output Summary (Last 24 hours) at 01/29/2021 1206 Last data filed at 01/29/2021 [id number] Gross per 24 hour  Intake 60 ml  Output 600 ml  Net -540 ml   Filed Weights   01/26/21 1723 01/29/21 0419  Weight: 65.8 kg 68.2 kg     Examination:         Physical Exam:   General:  Alert, oriented, cooperative, no distress; dysarthric  HEENT:  Normocephalic, PERRL, otherwise with in Normal limits   Neuro:   Dysarthric with dense left-sided weakness, strength intact on the right side CNII-XII intact. ,  Intact sensation, reflexes intact   Lungs:   Clear to auscultation BL, Respirations unlabored, no wheezes / crackles  Cardio:    S1/S2, RRR, No murmure, No Rubs or Gallops   Abdomen:    Soft, non-tender, bowel sounds active all four quadrants,  no guarding or peritoneal signs.  Muscular skeletal:   Dense left upper and lower extremity weakness  2/5 Strength intact on the right side Limited exam - in bed, ,  2+ pulses,  symmetric, No pitting edema  Skin:  Dry, warm to touch, negative for any Rashes,  Wounds: Please see nursing documentation                ------------------------------------------------------------------------------------------------------------------------------------------    LABs:  CBC Latest Ref Rng & Units 01/26/2021 11/09/2020 03/11/2020  WBC 4.0 - 10.5 K/uL 10.9(H) 7.5 6.2  Hemoglobin 12.0 - 15.0 g/dL 14.5 13.8 15.6(H)  Hematocrit 36.0 - 46.0 % 45.5 41.8 47.2(H)  Platelets 150 - 400 K/uL 200 165.0 235.0   CMP Latest Ref Rng & Units 01/26/2021 11/09/2020 03/11/2020  Glucose 70 - 99 mg/dL 117(H) 100(H) 120(H)  BUN 8 - 23 mg/dL '19 20 16  '$ Creatinine 0.44 - 1.00 mg/dL 1.22(H) 1.39(H) 1.26(H)  Sodium 135 - 145 mmol/L 137 139 137  Potassium 3.5 - 5.1 mmol/L 4.1 4.8 4.0  Chloride 98 - 111 mmol/L 104 99 96  CO2 22 - 32 mmol/L 24 32 30  Calcium 8.9 - 10.3 mg/dL 9.0 10.0 9.6  Total Protein 6.5 - 8.1 g/dL 7.5 7.7 7.8  Total Bilirubin 0.3 - 1.2 mg/dL 1.0 0.4 0.6  Alkaline Phos 38 - 126 U/L 48 42 59  AST 15 - 41 U/L '21 20 22  '$ ALT 0 - 44 U/L '18 14 12       '$ Micro Results Recent Results (from the past 240 hour(s))  Resp Panel by RT-PCR (Flu A&B, Covid) Nasopharyngeal Swab     Status: None   Collection Time: 01/26/21  8:05 PM   Specimen: Nasopharyngeal Swab; Nasopharyngeal(NP) swabs in vial transport medium  Result Value Ref Range Status   SARS Coronavirus 2 by RT PCR NEGATIVE NEGATIVE Final    Comment: (NOTE) SARS-CoV-2 target nucleic acids are NOT DETECTED.  The SARS-CoV-2 RNA is generally detectable in upper respiratory specimens during the acute phase of infection. The lowest concentration of SARS-CoV-2 viral copies this assay can detect  is 138 copies/mL. A negative result does not preclude SARS-Cov-2 infection and should not be used as the sole basis for treatment or other patient management decisions. A negative result may occur with  improper specimen collection/handling, submission of specimen other  than nasopharyngeal swab, presence of viral mutation(s) within the areas targeted by this assay, and inadequate number of viral copies(<138 copies/mL). A negative result must be combined with clinical observations, patient history, and epidemiological information. The expected result is Negative.  Fact Sheet for Patients:  EntrepreneurPulse.com.au  Fact Sheet for Healthcare Providers:  IncredibleEmployment.be  This test is no t yet approved or cleared by the [place] FDA and  has been authorized for detection and/or diagnosis of SARS-CoV-2 by FDA under an Emergency Use Authorization (EUA). This EUA will remain  in effect (meaning this test can be used) for the duration of the COVID-19 declaration under Section 564(b)(1) of the Act, 21 U.S.C.section 360bbb-3(b)(1), unless the authorization is terminated  or revoked sooner.       Influenza A by PCR NEGATIVE NEGATIVE Final   Influenza B by PCR NEGATIVE NEGATIVE Final    Comment: (NOTE) The Xpert Xpress SARS-CoV-2/FLU/RSV plus assay is intended as an aid in the diagnosis of influenza from Nasopharyngeal swab specimens and should not be used as a sole basis for treatment. Nasal washings and aspirates are unacceptable for Xpert Xpress SARS-CoV-2/FLU/RSV testing.  Fact Sheet for Patients: EntrepreneurPulse.com.au  Fact Sheet for Healthcare Providers: IncredibleEmployment.be  This test is not yet approved or cleared by the [place] FDA and has been authorized for detection and/or diagnosis of SARS-CoV-2 by FDA under an Emergency Use Authorization (EUA). This EUA will remain in effect  (meaning this test can be used) for the duration of the COVID-19 declaration under Section 564(b)(1) of the Act, 21 U.S.C. section 360bbb-3(b)(1), unless the authorization is terminated or revoked.  Performed at [facility], [address] [place], [place] [address]     Radiology Reports CT [facility] HEAD NECK W WO CM  Result Date: 01/27/2021 CLINICAL DATA:  Weakness with difficulty walking. Acute right ACA infarct on MRI. EXAM: CT ANGIOGRAPHY HEAD AND NECK TECHNIQUE: Multidetector CT imaging of the head and neck was performed using the standard protocol during bolus administration of intravenous contrast. Multiplanar CT image reconstructions and MIPs were obtained to evaluate the vascular anatomy. Carotid stenosis measurements (when applicable) are obtained utilizing NASCET criteria, using the distal internal carotid diameter as the denominator. CONTRAST:  48m OMNIPAQUE IOHEXOL 350 MG/ML SOLN COMPARISON:  Head MRI and head MRA 01/27/2021 FINDINGS: CTA NECK FINDINGS Aortic arch: Incomplete imaging of the aortic arch with exclusion of the brachiocephalic artery origin. The included portions of the brachiocephalic and subclavian arteries are patent with calcified and soft plaque in the subclavian arteries not resulting in significant stenosis. Right carotid system: Patent with a moderate amount of predominantly calcified plaque at the carotid bifurcation. No evidence of a significant stenosis or dissection. Left carotid system: Patent with prominently calcified plaque at the carotid bifurcation. No evidence of a significant stenosis or dissection. Vertebral arteries: Patent with calcified plaque at the right vertebral artery origin. No evidence of a significant stenosis or dissection. Mildly dominant left vertebral artery. Skeleton: Asymmetrically severe left TMJ arthropathy. Mild cervical disc degeneration. Advanced left facet arthrosis at C2-3. Other neck: No evidence of cervical lymphadenopathy or mass.  Upper chest: No apical lung consolidation or mass. Review of the MIP images confirms the above findings CTA HEAD FINDINGS Anterior circulation: The internal carotid arteries are patent from skull base to carotid termini with a small amount of calcified plaque bilaterally not resulting in significant stenosis. ACAs and MCAs are patent with moderate distal ACA branch vessel irregular narrowing but no evidence of a proximal  branch occlusion or significant proximal stenosis. A superiorly projecting aneurysm from the anterior communicating artery/right A1-A2 junction measures 5 x 4 mm. Posterior circulation: The intracranial vertebral arteries are widely patent to the basilar. Patent PICA and SCA origins are visualized bilaterally. The basilar artery is widely patent. There is a large left posterior communicating artery with markedly hypoplastic left P1 segment. The 2 mm outpouching at the left P1 origin on today's MRA has an appearance of an infundibulum on CTA rather than an aneurysm. Both PCAs are patent with atherosclerotic type irregularity including mild distal right P2 and bilateral P3 stenoses. The appearance of more severe PCA stenoses on MRA was likely due to artifactual signal loss. Venous sinuses: Patent. Anatomic variants: None. Review of the MIP images confirms the above findings IMPRESSION: 1. Intracranial atherosclerosis primarily infecting the small branch vessels. No large vessel occlusion. 2. 5 mm anterior communicating aneurysm. 3. Cervical carotid artery atherosclerosis without significant stenosis. Electronically Signed   By: Logan Bores M.D.   On: 01/27/2021 20:40   CT Head Wo Contrast  Result Date: 01/26/2021 CLINICAL DATA:  Delirium.  Three falls this week due to weakness. EXAM: CT HEAD WITHOUT CONTRAST TECHNIQUE: Contiguous axial images were obtained from the base of the skull through the vertex without intravenous contrast. COMPARISON:  None. FINDINGS: Brain: No evidence of acute  infarction, hemorrhage, hydrocephalus, extra-axial collection or mass lesion/mass effect. Mild periventricular and deep white matter hypodensity typical of chronic small vessel ischemia. 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 Vascular: No hyperdense vessel. Skull: No fracture or focal lesion. Sinuses/Orbits: Sclerosis involving the left mandibular condyle is likely related to degenerative change at the temporomandibular joint. Paranasal sinuses are clear. No mastoid effusion. No acute orbital abnormality. Other: None. IMPRESSION: 1. No acute intracranial abnormality. 2. Mild chronic small vessel ischemia. 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 Electronically Signed   By: Keith Rake M.D.   On: 01/26/2021 18:59   CT Lumbar Spine Wo Contrast  Result Date: 01/26/2021 CLINICAL DATA:  Initial evaluation for acute weakness, inability to walk. EXAM: CT LUMBAR SPINE WITHOUT CONTRAST TECHNIQUE: Multidetector CT imaging of the lumbar spine was performed without intravenous contrast administration. Multiplanar CT image reconstructions were also generated. COMPARISON:  None available. FINDINGS: Segmentation: Standard. Lowest well-formed disc space labeled the L5-S1 level. Alignment: Mild levoscoliosis. 3 mm retrolisthesis of L1 on L2, with 8 mm anterolisthesis of L4 on L5. Findings chronic and facet mediated. No pars defect. Vertebrae: Vertebral body height maintained without acute or chronic fracture. Visualized sacrum and pelvis intact. SI joints approximated symmetric. No discrete or worrisome osseous lesions. Paraspinal and other soft tissues: Paraspinous soft tissues demonstrate no acute finding. Moderate aorto bi-iliac atherosclerotic disease. Visualized visceral structures otherwise unremarkable. Disc levels: L1-2: 3 mm retrolisthesis. Mild annular disc bulge with bilateral facet hypertrophy. No  significant spinal stenosis. Mild to moderate bilateral L1 foraminal narrowing. L2-3: Minimal annular disc bulge. Moderate right worse than left facet hypertrophy. No significant spinal stenosis. Mild to moderate bilateral L2 foraminal stenosis. L3-4: Mild disc bulge, slightly eccentric to the right. Severe bilateral facet arthrosis. Resultant mild canal with bilateral subarticular stenosis. Moderate bilateral L3 foraminal narrowing. L4-5: 8 mm anterolisthesis. Associated broad posterior pseudo disc bulge/uncovering, eccentric to the right. Severe bilateral facet arthrosis with ligament flavum hypertrophy. Resultant severe canal with bilateral subarticular stenosis. Moderate to severe bilateral L4 foraminal narrowing, left worse than right. L5-S1: Minimal  disc bulge. Moderate left worse than right facet hypertrophy. No spinal stenosis. Foramina remain patent. IMPRESSION: 1. No acute abnormality within the lumbar spine. 2. 8 mm anterolisthesis of L4 on L5 with resultant severe spinal stenosis, with moderate to severe bilateral L4 foraminal narrowing, left worse than right. 3. Additional multifactorial degenerative changes at L1-2, L2-3, L3-4, and L5-S1 with resultant mild to moderate bilateral L1, L2, and L3 foraminal stenosis as above. 4. Aortic Atherosclerosis (ICD10-I70.0). Electronically Signed   By: Jeannine Boga M.D.   On: 01/26/2021 21:16   MR ANGIO HEAD WO CONTRAST  Result Date: 01/27/2021 CLINICAL DATA:  Severe left-sided weakness.  Inability to ambulate. EXAM: MRI HEAD WITHOUT AND WITH CONTRAST MRA HEAD WITHOUT CONTRAST TECHNIQUE: Multiplanar, multiecho pulse sequences of the brain and surrounding structures were obtained without and with intravenous contrast. Angiographic images of the head were obtained using MRA technique without contrast. CONTRAST:  6.88m GADAVIST GADOBUTROL 1 MMOL/ML IV SOLN COMPARISON:  Head CT 01/26/2021 FINDINGS: MRI HEAD FINDINGS Brain: There is a moderate-sized acute  distal right ACA territory infarct involving the body and splenium of the corpus callosum, right cingulate gyrus, and parasagittal right frontal and parietal lobes. Susceptibility artifact limits assessment of the posterior left cerebrum and cerebellum, mainly on diffusion-weighted and FLAIR sequences. T2 hyperintensities in the cerebral white matter bilaterally are nonspecific but compatible with mild chronic small vessel ischemic disease. Chronic lacunar infarcts are present in the bilateral basal ganglia, thalami, and cerebellum. There is mild cerebral atrophy. No abnormal enhancement is identified. Vascular: Major intracranial arterial flow voids are preserved. The major dural venous sinuses are enhancing. Skull and upper cervical spine: Unremarkable bone marrow signal. Sinuses/Orbits: Unremarkable orbits. Paranasal sinuses and mastoid air cells are clear. Other: None. MRA HEAD FINDINGS The visualized distal vertebral arteries are widely patent to the basilar with the left being slightly dominant. Patent PICA and SCA origins are identified bilaterally. The basilar artery is widely patent. There is a fetal type origin of the left PCA. A 2 mm outpouching from the distal basilar artery in the expected region of the left P1 origin may represent an aneurysm or the stump of an occluded P1 segment. There are severe distal right P2 and bilateral P3 stenoses. The internal carotid arteries are patent from skull base to carotid termini. The cavernous segments are tortuous with atherosclerotic type irregularity, however no significant ICA stenosis is evident. ACAs and MCAs are patent with mild branch vessel irregularity but no evidence of a proximal branch occlusion or flow limiting A1 or M1 stenosis. An aneurysm projecting superiorly from the anterior communicating artery/right A1-A2 junction region measures 5 x 4 mm IMPRESSION: 1. Moderate-sized acute distal right ACA territory infarct. 2. Mild chronic small vessel  ischemic disease in the cerebral white matter with multiple chronic lacunar infarcts as above. 3. Intracranial atherosclerosis without large vessel occlusion in the anterior circulation. 4. 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 5. 2 mm outpouching from the distal basilar artery which may represent an aneurysm versus the stump of an occluded left P1 segment. 6. 5 mm anterior communicating aneurysm. Electronically Signed   By: ALogan BoresM.D.   On: 01/27/2021 14:38   MR BRAIN W WO CONTRAST  Result Date: 01/27/2021 CLINICAL DATA:  Severe left-sided weakness.  Inability to ambulate. EXAM: MRI HEAD WITHOUT AND WITH CONTRAST MRA HEAD WITHOUT CONTRAST TECHNIQUE: Multiplanar, multiecho pulse sequences of the brain and surrounding structures were obtained without and with intravenous contrast. Angiographic images of the head  were obtained using MRA technique without contrast. CONTRAST:  6.40m GADAVIST GADOBUTROL 1 MMOL/ML IV SOLN COMPARISON:  Head CT 01/26/2021 FINDINGS: MRI HEAD FINDINGS Brain: There is a moderate-sized acute distal right ACA territory infarct involving the body and splenium of the corpus callosum, right cingulate gyrus, and parasagittal right frontal and parietal lobes. Susceptibility artifact limits assessment of the posterior left cerebrum and cerebellum, mainly on diffusion-weighted and FLAIR sequences. T2 hyperintensities in the cerebral white matter bilaterally are nonspecific but compatible with mild chronic small vessel ischemic disease. Chronic lacunar infarcts are present in the bilateral basal ganglia, thalami, and cerebellum. There is mild cerebral atrophy. No abnormal enhancement is identified. Vascular: Major intracranial arterial flow voids are preserved. The major dural venous sinuses are enhancing. Skull and upper cervical spine: Unremarkable bone marrow signal. Sinuses/Orbits: Unremarkable orbits. Paranasal sinuses and mastoid air cells are clear. Other: None. MRA HEAD  FINDINGS The visualized distal vertebral arteries are widely patent to the basilar with the left being slightly dominant. Patent PICA and SCA origins are identified bilaterally. The basilar artery is widely patent. There is a fetal type origin of the left PCA. A 2 mm outpouching from the distal basilar artery in the expected region of the left P1 origin may represent an aneurysm or the stump of an occluded P1 segment. There are severe distal right P2 and bilateral P3 stenoses. The internal carotid arteries are patent from skull base to carotid termini. The cavernous segments are tortuous with atherosclerotic type irregularity, however no significant ICA stenosis is evident. ACAs and MCAs are patent with mild branch vessel irregularity but no evidence of a proximal branch occlusion or flow limiting A1 or M1 stenosis. An aneurysm projecting superiorly from the anterior communicating artery/right A1-A2 junction region measures 5 x 4 mm IMPRESSION: 1. Moderate-sized acute distal right ACA territory infarct. 2. 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 3. Intracranial atherosclerosis without large vessel occlusion in the anterior circulation. 4. 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 5. 2 mm outpouching from the distal basilar artery which may represent an aneurysm versus the stump of an occluded left P1 segment. 6. 5 mm anterior communicating aneurysm. Electronically Signed   By: ALogan BoresM.D.   On: 01/27/2021 14:38   [place] [facility]  Result Date: 01/27/2021 CLINICAL DATA:  Ambulatory dysfunction Hypertension Hyperlipidemia Tobacco use EXAM: BILATERAL CAROTID DUPLEX ULTRASOUND TECHNIQUE: GPearline Cablesscale imaging, color Doppler and duplex ultrasound were performed of bilateral carotid and vertebral arteries in the neck. COMPARISON:  None. FINDINGS: Criteria: Quantification of carotid stenosis is based on  velocity parameters that correlate the residual internal carotid diameter with NASCET-based stenosis levels, using the diameter of the distal internal carotid lumen as the denominator for stenosis measurement. The following velocity measurements were obtained: RIGHT ICA: 70/22 cm/sec CCA: [id number]/sec SYSTOLIC ICA/CCA RATIO:  1.5 ECA: 120 cm/sec LEFT ICA: 83/14 cm/sec CCA: [id number]/sec SYSTOLIC ICA/CCA RATIO:  1.2 ECA: 156 cm/sec RIGHT CAROTID ARTERY: Mild atheromatous plaque of the carotid bulb extending into the internal carotid artery origin velocity parameters indicative of less than 50% stenosis. RIGHT VERTEBRAL ARTERY:  Antegrade flow. LEFT CAROTID ARTERY: Bulky heterogeneous plaque of the left carotid bifurcation with velocity parameters indicative of less than 50% stenosis. LEFT VERTEBRAL ARTERY:  Antegrade flow. IMPRESSION: Less than 50% stenosis of the internal carotid arteries. Degree of stenosis in the left internal carotid artery may be  greater as bulky plaque at the origin may be obscuring higher velocities. Further evaluation with CT angiography of the neck would be beneficial. Electronically Signed   By: Miachel Roux M.D.   On: 01/27/2021 10:16   ECHOCARDIOGRAM COMPLETE  Result Date: 01/28/2021    ECHOCARDIOGRAM REPORT   Patient Name:   SHADIYA HALLINAN Date of Exam: 01/28/2021 Medical Rec #:  [id number]       Height:       64.0 in Accession #:    [id number]      Weight:       145.0 lb Date of Birth:  05-01-1951        BSA:          1.706 m Patient Age:    34 years        BP:           155/105 mmHg Patient Gender: F               HR:           85 bpm. Exam Location:  [facility] Procedure: 2D Echo, Cardiac Doppler, Color Doppler and Intracardiac            Opacification Agent Indications:    CVA  History:        Patient has no prior history of Echocardiogram examinations.                 Stroke; Risk Factors:Hypertension, Dyslipidemia and Current                 Smoker. Spinal stenosis.  Sonographer:     Dustin Flock RDCS Referring Phys: [id number] ASIA B [place]  Sonographer Comments: Technically difficult study due to poor echo windows. Spinal stenosis and CVA. IMPRESSIONS  1. Images are very limited dues to poor acoustic windows.  2. Left ventricular ejection fraction, by estimation, is 65 to 70%. The left ventricle has normal function. Left ventricular endocardial border not optimally defined to evaluate regional wall motion. There is mild left ventricular hypertrophy. Left ventricular diastolic parameters are indeterminate.  3. Right ventricular systolic function is normal. The right ventricular size is normal.  4. The mitral valve is grossly normal. Trivial mitral valve regurgitation.  5. The aortic valve was not well visualized. Aortic valve regurgitation is not visualized.  6. Unable to estimate CVP. FINDINGS  Left Ventricle: Left ventricular ejection fraction, by estimation, is 65 to 70%. The left ventricle has normal function. Left ventricular endocardial border not optimally defined to evaluate regional wall motion. Definity contrast agent was given IV to delineate the left ventricular endocardial borders. The left ventricular internal cavity size was normal in size. There is mild left ventricular hypertrophy. Left ventricular diastolic parameters are indeterminate. Right Ventricle: The right ventricular size is normal. Right vetricular wall thickness was not assessed. Right ventricular systolic function is normal. Left Atrium: Left atrial size was normal in size. Right Atrium: Right atrial size was normal in size. Pericardium: There is no evidence of pericardial effusion. Presence of pericardial fat pad. Mitral Valve: The mitral valve is grossly normal. Trivial mitral valve regurgitation. Tricuspid Valve: The tricuspid valve is not well visualized. Tricuspid valve regurgitation is trivial. Aortic Valve: The aortic valve was not well visualized. Aortic valve regurgitation is not visualized.  Pulmonic Valve: The pulmonic valve was not assessed. Pulmonic valve regurgitation is not visualized. Aorta: The aortic root was not well visualized. Venous: Unable to estimate CVP. The inferior vena cava was not well  visualized. IAS/Shunts: No atrial level shunt detected by color flow Doppler.   Diastology LV e' medial:    4.13 cm/s LV E/e' medial:  9.9 LV e' lateral:   7.62 cm/s LV E/e' lateral: 5.3  RIGHT VENTRICLE RV Basal diam:  2.50 cm RV S prime:     8.70 cm/s LEFT ATRIUM             Index       RIGHT ATRIUM           Index LA Vol (A2C):   32.5 ml 19.05 ml/m RA Area:     11.40 cm LA Vol (A4C):   34.8 ml 20.39 ml/m RA Volume:   26.10 ml  15.29 ml/m LA Biplane Vol: 34.9 ml 20.45 ml/m  AORTIC VALVE LVOT Vmax:   71.10 cm/s LVOT Vmean:  44.400 cm/s LVOT VTI:    0.143 m MITRAL VALVE MV Area (PHT): 4.12 cm    SHUNTS MV Decel Time: 184 msec    Systemic VTI: 0.14 m MV E velocity: 40.70 cm/s MV A velocity: 78.40 cm/s MV E/A ratio:  0.52 Rozann Lesches MD Electronically signed by Rozann Lesches MD Signature Date/Time: 01/28/2021/11:38:20 AM    Final     SIGNED: Deatra James, MD, FHM. [facility],  Pager (please use [email or web address] to page/text) Please use Epic Secure Chat for non-urgent communication (7AM-7PM)  If 7PM-7AM, please contact night-coverage [email or web address], 01/29/2021, 12:06 PM

## 2021-01-29 NOTE — Progress Notes (Signed)
  Speech Language Pathology Treatment: Cognitive-Linquistic  Patient Details Name: Susan Davidson MRN: [id number] DOB: 1951/03/29 Today's Date: 01/29/2021 Time: [id number] SLP Time Calculation (min) (ACUTE ONLY): 13.53 min  Assessment / Plan / Recommendation Clinical Impression  SLP provided onging dysarthria/cognitive-linguistic therapy today and reinforced strategies/compensations previously implemented during ST. Pt continues to present with decreased intelligibility (75-100% intelligible); note independent usage of strategies which were further reinforced by SLP today. Pt was oriented to location and situation. SLP reinforced over-articulation and increased volume to improve intelligibility; Pt continues to be very stimulable and responsive to strategies. ST will continue to follow acutely.     HPI HPI: Susan Davidson  is a 70 y.o. female, with history of vitamin D deficiency, vitamin B12 deficiency, hypothyroidism, hypertension, hyperlipidemia, psych disorder, and more presents to ED with a chief complaint.  Patient is a notably poor historian.  She reports that she had sudden onset of ambulatory dysfunction today.  She reports her last known well was yesterday.  She is not able to give more specific times than that.  She reports that when she tries to walk she falls to the left.  She reports that this is never happened before, but that she has had trouble with falling to the right in the past.  She reports that her trouble falling to the right started after she took the first COVID shot, and she thinks that this is related to that as well.  She did the first COVID shot in January 2021.  She denies any numbness or injury to her back or legs.  She reports that she did fall to the ground and bumped her head when she first noted that she could not walk.  She did not have loss of consciousness.  She reports that she landed flat on her left side, but did not hit her hip or her knee.  She has no pain from the  fall.  Patient reports that she has had decreased appetite today.  No dysphagia, no difficulty talking.  She reports no change in vision, and possible 2 days of muffled hearing.  She reports that she does have allergies.  Her last normal bowel movement was yesterday.  She has no other complaints at this time. SLE requested. MRI pending.      SLP Plan  Continue with current plan of care                      Plan: Continue with current plan of care       Susan Davidson H. Roddie Mc, CCC-SLP Speech Language Pathologist   Susan Davidson 01/29/2021, 12:01 PM

## 2021-01-29 NOTE — TOC Progression Note (Addendum)
Transition of Care [facility]) - Progression Note    Patient Details  Name: Susan Davidson MRN: [id number] Date of Birth: 30-Jan-1951  [facility]) CM/SW Contact  Boneta Lucks, RN Phone Number: 01/29/2021, 12:40 PM  Clinical Narrative:   PASSR number received [id number],  Debbie with [facility] now trying to start [place].  TOC attempted, Insurance holding up auth to check patient eligibility.   Addendum : received INS Auth, Updated MD/RN to discharge patient the morning to [facility].   Expected Discharge Plan: [place] Barriers to Discharge: Continued Medical Work up  Expected Discharge Plan and Services Expected Discharge Plan: [place] In-house Referral: Clinical Social Work Discharge Planning Services: CM Consult Post Acute Care Choice: [place] arrangements for the past 2 months: Single Family Home                 DME Arranged: N/A DME Agency: NA    HH Arranged: NA HH Agency: NA     Readmission Risk Interventions Readmission Risk Prevention Plan 01/29/2021  Medication Screening Complete  Transportation Screening Complete  Some recent data might be hidden

## 2021-01-29 NOTE — Telephone Encounter (Signed)
    Made aware by the Hospitalist (Dr. Roger Shelter) this patient needs a 30-day monitor for CVA. We do not have a 30-day monitor in the office, therefore this will be ordered and sent to the patient. At this time, discharge plans are unclear as she is likely going to SNF pending insurance approval.   Our office staff will follow-up on Monday in helping to arrange monitor placement in the outpatient setting.   Signed, Erma Heritage, PA-C 01/29/2021, 10:25 AM

## 2021-01-29 NOTE — Plan of Care (Signed)
  Problem: Education: Goal: Knowledge of General Education information will improve Description: Including pain rating scale, medication(s)/side effects and non-pharmacologic comfort measures Outcome: Progressing   Problem: Health Behavior/Discharge Planning: Goal: Ability to manage health-related needs will improve Outcome: Progressing   Problem: Clinical Measurements: Goal: Ability to maintain clinical measurements within normal limits will improve Outcome: Progressing Goal: Will remain free from infection Outcome: Progressing Goal: Diagnostic test results will improve Outcome: Progressing Goal: Respiratory complications will improve Outcome: Progressing Goal: Cardiovascular complication will be avoided Outcome: Progressing   Problem: Activity: Goal: Risk for activity intolerance will decrease Outcome: Progressing   Problem: Nutrition: Goal: Adequate nutrition will be maintained Outcome: Progressing   Problem: Coping: Goal: Level of anxiety will decrease Outcome: Progressing   Problem: Elimination: Goal: Will not experience complications related to bowel motility Outcome: Progressing Goal: Will not experience complications related to urinary retention Outcome: Progressing   Problem: Pain Managment: Goal: General experience of comfort will improve Outcome: Progressing   Problem: Safety: Goal: Ability to remain free from injury will improve Outcome: Progressing   Problem: Skin Integrity: Goal: Risk for impaired skin integrity will decrease Outcome: Progressing   Problem: Education: Goal: Knowledge of disease or condition will improve Outcome: Progressing Goal: Knowledge of patient specific risk factors addressed and post discharge goals established will improve Outcome: Progressing Goal: Individualized Educational Video(s) Outcome: Progressing   Problem: Coping: Goal: Will identify appropriate support needs Outcome: Progressing   Problem: Health  Behavior/Discharge Planning: Goal: Ability to manage health-related needs will improve Outcome: Progressing   Problem: Self-Care: Goal: Ability to participate in self-care as condition permits will improve Outcome: Progressing Goal: Ability to communicate needs accurately will improve Outcome: Progressing   Problem: Nutrition: Goal: Risk of aspiration will decrease Outcome: Progressing   Problem: Intracerebral Hemorrhage Tissue Perfusion: Goal: Complications of Intracerebral Hemorrhage will be minimized Outcome: Progressing

## 2021-01-29 NOTE — Telephone Encounter (Signed)
Noted  

## 2021-01-30 DIAGNOSIS — E039 Hypothyroidism, unspecified: Secondary | ICD-10-CM | POA: Diagnosis not present

## 2021-01-30 DIAGNOSIS — F99 Mental disorder, not otherwise specified: Secondary | ICD-10-CM | POA: Diagnosis not present

## 2021-01-30 DIAGNOSIS — D509 Iron deficiency anemia, unspecified: Secondary | ICD-10-CM | POA: Diagnosis not present

## 2021-01-30 DIAGNOSIS — G2581 Restless legs syndrome: Secondary | ICD-10-CM | POA: Diagnosis not present

## 2021-01-30 DIAGNOSIS — F909 Attention-deficit hyperactivity disorder, unspecified type: Secondary | ICD-10-CM | POA: Diagnosis not present

## 2021-01-30 DIAGNOSIS — R230 Cyanosis: Secondary | ICD-10-CM | POA: Diagnosis not present

## 2021-01-30 DIAGNOSIS — I672 Cerebral atherosclerosis: Secondary | ICD-10-CM | POA: Diagnosis not present

## 2021-01-30 DIAGNOSIS — M5030 Other cervical disc degeneration, unspecified cervical region: Secondary | ICD-10-CM | POA: Diagnosis not present

## 2021-01-30 DIAGNOSIS — I10 Essential (primary) hypertension: Secondary | ICD-10-CM | POA: Diagnosis not present

## 2021-01-30 DIAGNOSIS — M50322 Other cervical disc degeneration at C5-C6 level: Secondary | ICD-10-CM | POA: Diagnosis not present

## 2021-01-30 DIAGNOSIS — N184 Chronic kidney disease, stage 4 (severe): Secondary | ICD-10-CM | POA: Diagnosis not present

## 2021-01-30 DIAGNOSIS — W19XXXD Unspecified fall, subsequent encounter: Secondary | ICD-10-CM | POA: Diagnosis not present

## 2021-01-30 DIAGNOSIS — W06XXXA Fall from bed, initial encounter: Secondary | ICD-10-CM | POA: Diagnosis not present

## 2021-01-30 DIAGNOSIS — M6281 Muscle weakness (generalized): Secondary | ICD-10-CM | POA: Diagnosis not present

## 2021-01-30 DIAGNOSIS — Z20822 Contact with and (suspected) exposure to covid-19: Secondary | ICD-10-CM | POA: Diagnosis not present

## 2021-01-30 DIAGNOSIS — R404 Transient alteration of awareness: Secondary | ICD-10-CM | POA: Diagnosis not present

## 2021-01-30 DIAGNOSIS — R279 Unspecified lack of coordination: Secondary | ICD-10-CM | POA: Diagnosis not present

## 2021-01-30 DIAGNOSIS — Z79899 Other long term (current) drug therapy: Secondary | ICD-10-CM | POA: Diagnosis not present

## 2021-01-30 DIAGNOSIS — N189 Chronic kidney disease, unspecified: Secondary | ICD-10-CM | POA: Diagnosis not present

## 2021-01-30 DIAGNOSIS — F330 Major depressive disorder, recurrent, mild: Secondary | ICD-10-CM | POA: Diagnosis not present

## 2021-01-30 DIAGNOSIS — M4802 Spinal stenosis, cervical region: Secondary | ICD-10-CM | POA: Diagnosis not present

## 2021-01-30 DIAGNOSIS — R41841 Cognitive communication deficit: Secondary | ICD-10-CM | POA: Diagnosis not present

## 2021-01-30 DIAGNOSIS — E559 Vitamin D deficiency, unspecified: Secondary | ICD-10-CM | POA: Diagnosis not present

## 2021-01-30 DIAGNOSIS — S0083XA Contusion of other part of head, initial encounter: Secondary | ICD-10-CM | POA: Diagnosis not present

## 2021-01-30 DIAGNOSIS — Z7982 Long term (current) use of aspirin: Secondary | ICD-10-CM | POA: Diagnosis not present

## 2021-01-30 DIAGNOSIS — R2689 Other abnormalities of gait and mobility: Secondary | ICD-10-CM | POA: Diagnosis not present

## 2021-01-30 DIAGNOSIS — E785 Hyperlipidemia, unspecified: Secondary | ICD-10-CM | POA: Diagnosis not present

## 2021-01-30 DIAGNOSIS — Z743 Need for continuous supervision: Secondary | ICD-10-CM | POA: Diagnosis not present

## 2021-01-30 DIAGNOSIS — Z7401 Bed confinement status: Secondary | ICD-10-CM | POA: Diagnosis not present

## 2021-01-30 DIAGNOSIS — Z7902 Long term (current) use of antithrombotics/antiplatelets: Secondary | ICD-10-CM | POA: Diagnosis not present

## 2021-01-30 DIAGNOSIS — F419 Anxiety disorder, unspecified: Secondary | ICD-10-CM | POA: Diagnosis not present

## 2021-01-30 DIAGNOSIS — E539 Vitamin B deficiency, unspecified: Secondary | ICD-10-CM | POA: Diagnosis not present

## 2021-01-30 DIAGNOSIS — E119 Type 2 diabetes mellitus without complications: Secondary | ICD-10-CM | POA: Diagnosis not present

## 2021-01-30 DIAGNOSIS — M47813 Spondylosis without myelopathy or radiculopathy, cervicothoracic region: Secondary | ICD-10-CM | POA: Diagnosis not present

## 2021-01-30 DIAGNOSIS — R238 Other skin changes: Secondary | ICD-10-CM | POA: Diagnosis not present

## 2021-01-30 DIAGNOSIS — F17200 Nicotine dependence, unspecified, uncomplicated: Secondary | ICD-10-CM | POA: Diagnosis not present

## 2021-01-30 DIAGNOSIS — R471 Dysarthria and anarthria: Secondary | ICD-10-CM | POA: Diagnosis not present

## 2021-01-30 DIAGNOSIS — R0902 Hypoxemia: Secondary | ICD-10-CM | POA: Diagnosis not present

## 2021-01-30 DIAGNOSIS — R5381 Other malaise: Secondary | ICD-10-CM | POA: Diagnosis not present

## 2021-01-30 DIAGNOSIS — I63521 Cerebral infarction due to unspecified occlusion or stenosis of right anterior cerebral artery: Secondary | ICD-10-CM | POA: Diagnosis not present

## 2021-01-30 DIAGNOSIS — N390 Urinary tract infection, site not specified: Secondary | ICD-10-CM | POA: Diagnosis not present

## 2021-01-30 DIAGNOSIS — F411 Generalized anxiety disorder: Secondary | ICD-10-CM | POA: Diagnosis not present

## 2021-01-30 DIAGNOSIS — G8194 Hemiplegia, unspecified affecting left nondominant side: Secondary | ICD-10-CM | POA: Diagnosis not present

## 2021-01-30 DIAGNOSIS — Y92129 Unspecified place in nursing home as the place of occurrence of the external cause: Secondary | ICD-10-CM | POA: Diagnosis not present

## 2021-01-30 DIAGNOSIS — S0990XA Unspecified injury of head, initial encounter: Secondary | ICD-10-CM | POA: Diagnosis not present

## 2021-01-30 DIAGNOSIS — R531 Weakness: Secondary | ICD-10-CM | POA: Diagnosis not present

## 2021-01-30 DIAGNOSIS — F32A Depression, unspecified: Secondary | ICD-10-CM | POA: Diagnosis not present

## 2021-01-30 DIAGNOSIS — I129 Hypertensive chronic kidney disease with stage 1 through stage 4 chronic kidney disease, or unspecified chronic kidney disease: Secondary | ICD-10-CM | POA: Diagnosis not present

## 2021-01-30 DIAGNOSIS — S199XXA Unspecified injury of neck, initial encounter: Secondary | ICD-10-CM | POA: Diagnosis not present

## 2021-01-30 DIAGNOSIS — I679 Cerebrovascular disease, unspecified: Secondary | ICD-10-CM | POA: Diagnosis not present

## 2021-01-30 DIAGNOSIS — S0003XA Contusion of scalp, initial encounter: Secondary | ICD-10-CM | POA: Diagnosis not present

## 2021-01-30 DIAGNOSIS — I739 Peripheral vascular disease, unspecified: Secondary | ICD-10-CM | POA: Diagnosis not present

## 2021-01-30 DIAGNOSIS — F0390 Unspecified dementia without behavioral disturbance: Secondary | ICD-10-CM | POA: Diagnosis not present

## 2021-01-30 DIAGNOSIS — I998 Other disorder of circulatory system: Secondary | ICD-10-CM | POA: Diagnosis not present

## 2021-01-30 DIAGNOSIS — R262 Difficulty in walking, not elsewhere classified: Secondary | ICD-10-CM | POA: Diagnosis not present

## 2021-01-30 DIAGNOSIS — F29 Unspecified psychosis not due to a substance or known physiological condition: Secondary | ICD-10-CM | POA: Diagnosis not present

## 2021-01-30 DIAGNOSIS — M47812 Spondylosis without myelopathy or radiculopathy, cervical region: Secondary | ICD-10-CM | POA: Diagnosis not present

## 2021-01-30 DIAGNOSIS — D649 Anemia, unspecified: Secondary | ICD-10-CM | POA: Diagnosis not present

## 2021-01-30 MED ORDER — ALPRAZOLAM 0.5 MG PO TABS: 0.5000 mg | ORAL_TABLET | Freq: Two times a day (BID) | ORAL | 0 refills | Status: DC | PRN

## 2021-01-30 MED ORDER — CLOPIDOGREL BISULFATE 75 MG PO TABS: 75.0000 mg | ORAL_TABLET | Freq: Every day | ORAL | 0 refills | Status: DC

## 2021-01-30 MED ORDER — ASPIRIN 81 MG PO TBEC: 81.0000 mg | DELAYED_RELEASE_TABLET | Freq: Every day | ORAL | 11 refills | Status: DC

## 2021-01-30 MED ORDER — AMPHETAMINE-DEXTROAMPHETAMINE 20 MG PO TABS: 20.0000 mg | ORAL_TABLET | Freq: Two times a day (BID) | ORAL | 0 refills | Status: DC

## 2021-01-30 MED ORDER — DOXYCYCLINE HYCLATE 100 MG PO TABS
200.0000 mg | ORAL_TABLET | Freq: Once | ORAL | Status: AC
  Administered 2021-01-30: 200 mg via ORAL
  Filled 2021-01-30: qty 2

## 2021-01-30 NOTE — TOC Progression Note (Addendum)
Transition of Care [facility]) - Progression Note    Patient Details  Name: Susan Davidson MRN: [id number] Date of Birth: 01/14/1951  Transition of Care [facility]) CM/SW Contact  [address] [place], [place] Phone Number: 01/30/2021, 12:29 PM  Clinical Narrative:    Phone call to [facility] regarding patient's discharge to [facility]. Voicemail message left for a return call regarding patient's transition there today.  1:00pm Phone call to [facility] regarding patient's discharge to [facility]. Voicemail message left for a return call.   [id number] Phone cal to [place] [facility], transferred to [place]. Voicemail message left for a return call regarding patient's admission there today.    [address] LCSW Transition of Care [phone]   Expected Discharge Plan: [place] Barriers to Discharge: Continued Medical Work up  Expected Discharge Plan and Services Expected Discharge Plan: [place] In-house Referral: Clinical Social Work Discharge Planning Services: CM Consult Post Acute Care Choice: [place] arrangements for the past 2 months: Single Family Home Expected Discharge Date: 01/30/21               DME Arranged: N/A DME Agency: NA       HH Arranged: NA HH Agency: NA         Social Determinants of Health (SDOH) Interventions    Readmission Risk Interventions Readmission Risk Prevention Plan 01/29/2021  Medication Screening Complete  Transportation Screening Complete  Some recent data might be hidden

## 2021-01-30 NOTE — TOC Transition Note (Signed)
Transition of Care [facility]) - CM/SW Discharge Note   Patient Details  Name: Susan Davidson MRN: [id number] Date of Birth: Jun 09, 1951  [facility]) CM/SW Contact:  Elliot Gurney [place], [place] Phone Number: 01/30/2021, 1:50 PM   Clinical Narrative:    Patient to discharge today to [facility]. Patient will be going to Room B23 Bed 1. Report to be called in to [facility] [phone]. Patient to be be transported by [facility].   [place], LCSW Transition of Care [phone]    Final next level of care: Skilled Nursing Facility Barriers to Discharge: Continued Medical Work up   Patient Goals and CMS Choice Patient states their goals for this hospitalization and ongoing recovery are:: Go to SNF CMS Medicare.gov Compare Post Acute Care list provided to:: Patient Represenative (must comment) Choice offered to / list presented to : Spouse  Discharge Placement                       Discharge Plan and Services In-house Referral: Clinical Social Work Discharge Planning Services: CM Consult Post Acute Care Choice: [place]          DME Arranged: N/A DME Agency: NA       HH Arranged: NA HH Agency: NA        Social Determinants of Health (SDOH) Interventions     Readmission Risk Interventions Readmission Risk Prevention Plan 01/29/2021  Medication Screening Complete  Transportation Screening Complete  Some recent data might be hidden

## 2021-01-30 NOTE — Plan of Care (Signed)
  Problem: Education: Goal: Knowledge of General Education information will improve Description: Including pain rating scale, medication(s)/side effects and non-pharmacologic comfort measures Outcome: Progressing   Problem: Health Behavior/Discharge Planning: Goal: Ability to manage health-related needs will improve Outcome: Progressing   Problem: Clinical Measurements: Goal: Ability to maintain clinical measurements within normal limits will improve Outcome: Progressing Goal: Will remain free from infection Outcome: Progressing Goal: Diagnostic test results will improve Outcome: Progressing Goal: Respiratory complications will improve Outcome: Progressing Goal: Cardiovascular complication will be avoided Outcome: Progressing   Problem: Activity: Goal: Risk for activity intolerance will decrease Outcome: Progressing   Problem: Nutrition: Goal: Adequate nutrition will be maintained Outcome: Progressing   Problem: Coping: Goal: Level of anxiety will decrease Outcome: Progressing   Problem: Elimination: Goal: Will not experience complications related to bowel motility Outcome: Progressing Goal: Will not experience complications related to urinary retention Outcome: Progressing   Problem: Pain Managment: Goal: General experience of comfort will improve Outcome: Progressing   Problem: Safety: Goal: Ability to remain free from injury will improve Outcome: Progressing   Problem: Skin Integrity: Goal: Risk for impaired skin integrity will decrease Outcome: Progressing   Problem: Education: Goal: Knowledge of disease or condition will improve Outcome: Progressing Goal: Knowledge of patient specific risk factors addressed and post discharge goals established will improve Outcome: Progressing Goal: Individualized Educational Video(s) Outcome: Progressing   Problem: Coping: Goal: Will identify appropriate support needs Outcome: Progressing   Problem: Health  Behavior/Discharge Planning: Goal: Ability to manage health-related needs will improve Outcome: Progressing   Problem: Self-Care: Goal: Ability to participate in self-care as condition permits will improve Outcome: Progressing Goal: Ability to communicate needs accurately will improve Outcome: Progressing   Problem: Nutrition: Goal: Risk of aspiration will decrease Outcome: Progressing   Problem: Intracerebral Hemorrhage Tissue Perfusion: Goal: Complications of Intracerebral Hemorrhage will be minimized Outcome: Progressing

## 2021-01-30 NOTE — Progress Notes (Signed)
MD Zierle-Ghos notified of patients elevated Bp. MD states to monitor.   Hi good morning. this patients BP is 178/89. 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 Since it is less than 180/90, do you want to just monitor?

## 2021-01-30 NOTE — Discharge Summary (Signed)
Physician Discharge Summary  Susan Davidson [id number] DOB: 10-27-1950 DOA: 01/26/2021  PCP: Biagio Borg, MD  Admit date: 01/26/2021  Discharge date: 01/30/2021  Admitted From:Home  Disposition:  SNF  Recommendations for Outpatient Follow-up:  1. Follow up with PCP in 1-2 weeks 2. Follow-up with neurology in 4 weeks 3. Outpatient Holter monitoring to be arranged by cardiology for 30 days 4. Continue on dual antiplatelet therapy as prescribed with aspirin and Plavix for 1 month and then switch to aspirin 325 mg daily thereafter 5. Continue other home medications as prior  Home Health: None  Equipment/Devices: None  Discharge Condition:Stable  CODE STATUS: Full  Diet recommendation: Heart Healthy  Brief/Interim Summary: Per HPI: [name] a8 y.o.female,with history of vitamin D deficiency, vitamin B12 deficiency, hypothyroidism, hypertension, hyperlipidemia, psych disorder, and more presents to ED with a chief complaint of severe generalized weakness, unable to ambulate acutely. Sever left sided weakness ... Per husband her symptoms started on Monday.  Patient is a very poor historian but able to communicate.  Stating she started feeling weak and unable to walk for past few days.  She also had a fall she does not remember the details.  On admission she was somewhat delirious stating that he might of been due to her COVID shot that she received in January 2021  -Patient was admitted with dense left-sided weakness and dysarthria noted to be related to acute CVA.  Per neurology this appears to be cryptogenic.  Recommendations were for dual antiplatelet therapies in 30 days along with single agent moving forward thereafter.  2D echocardiogram did not reveal any acute abnormalities.  PT recommendation for SNF noted.  Cardiology will arrange 30-day cardiac event monitor.  Blood pressure medications can now be resumed.  During the course of her stay, she was noted to have  incidental finding of a tick in her hair and prophylactic doxycycline was administered.  Discharge Diagnoses:  Principal Problem:   Ambulatory dysfunction Active Problems:   Hypothyroidism   Vitamin D deficiency   Essential hypertension   B12 deficiency   Tobacco use disorder   Generalized weakness   Spinal stenosis at L4-L5 level   CVA (cerebral vascular accident) [facility])  Principal discharge diagnosis: Dense left-sided weakness and dysarthria secondary to acute CVA.  Discharge Instructions  Discharge Instructions    Diet - low sodium heart healthy   Complete by: As directed    Increase activity slowly   Complete by: As directed      Allergies as of 01/30/2021      Reactions   Other Anaphylaxis   Fire ant venom   Epinephrine    Influenza Vac Split Quad    fatigue      Medication List    TAKE these medications   ALPRAZolam 0.5 MG tablet Commonly known as: XANAX Take 1 tablet (0.5 mg total) by mouth 2 (two) times daily as needed for anxiety.   amphetamine-dextroamphetamine 20 MG tablet Commonly known as: ADDERALL Take 1 tablet (20 mg total) by mouth 2 (two) times daily.   aspirin 81 MG EC tablet Take 1 tablet (81 mg total) by mouth daily. Swallow whole. Start taking on: Jan 31, 2021   clopidogrel 75 MG tablet Commonly known as: PLAVIX Take 1 tablet (75 mg total) by mouth daily. Start taking on: Jan 31, 2021   levothyroxine 50 MCG tablet Commonly known as: SYNTHROID TAKE 1 TABLET BY MOUTH DAILY BEFORE BREAKFAST   lisinopril 10 MG tablet Commonly known as: ZESTRIL Take  1 tablet (10 mg total) by mouth daily.   lovastatin 20 MG tablet Commonly known as: MEVACOR TAKE 1 TABLET BY MOUTH EVERY EVENING AT 6 PM   PARoxetine 40 MG tablet Commonly known as: PAXIL Take 2 tablets by mouth daily.   risperiDONE 1 MG tablet Commonly known as: RISPERDAL Take 1 mg by mouth 2 (two) times daily.   vitamin B-12 1000 MCG tablet Commonly known as: CYANOCOBALAMIN Take 1  tablet (1,000 mcg total) by mouth daily.       Contact information for follow-up providers    Phillips Odor, MD. Schedule an appointment as soon as possible for a visit in 4 week(s).   Specialty: Neurology Contact information: [address] [place] [address] [phone]            Contact information for after-discharge care    [facility] .   Service: Skilled Nursing Contact information: [place] [phone]                 Allergies  Allergen Reactions  . Other Anaphylaxis    Fire ant venom  . Epinephrine   . Influenza Vac Split Quad     fatigue    Consultations:  Neurology   Procedures/Studies: CT ANGIO HEAD NECK W WO CM  Result Date: 01/27/2021 CLINICAL DATA:  Weakness with difficulty walking. Acute right ACA infarct on MRI. EXAM: CT ANGIOGRAPHY HEAD AND NECK TECHNIQUE: Multidetector CT imaging of the head and neck was performed using the standard protocol during bolus administration of intravenous contrast. Multiplanar CT image reconstructions and MIPs were obtained to evaluate the vascular anatomy. Carotid stenosis measurements (when applicable) are obtained utilizing NASCET criteria, using the distal internal carotid diameter as the denominator. CONTRAST:  72m OMNIPAQUE IOHEXOL 350 MG/ML SOLN COMPARISON:  Head MRI and head MRA 01/27/2021 FINDINGS: CTA NECK FINDINGS Aortic arch: Incomplete imaging of the aortic arch with exclusion of the brachiocephalic artery origin. The included portions of the brachiocephalic and subclavian arteries are patent with calcified and soft plaque in the subclavian arteries not resulting in significant stenosis. Right carotid system: Patent with a moderate amount of predominantly calcified plaque at the carotid bifurcation. No evidence of a significant stenosis or dissection. Left carotid system: Patent with prominently calcified  plaque at the carotid bifurcation. No evidence of a significant stenosis or dissection. Vertebral arteries: Patent with calcified plaque at the right vertebral artery origin. No evidence of a significant stenosis or dissection. Mildly dominant left vertebral artery. Skeleton: Asymmetrically severe left TMJ arthropathy. Mild cervical disc degeneration. Advanced left facet arthrosis at C2-3. Other neck: No evidence of cervical lymphadenopathy or mass. Upper chest: No apical lung consolidation or mass. Review of the MIP images confirms the above findings CTA HEAD FINDINGS Anterior circulation: The internal carotid arteries are patent from skull base to carotid termini with a small amount of calcified plaque bilaterally not resulting in significant stenosis. ACAs and MCAs are patent with moderate distal ACA branch vessel irregular narrowing but no evidence of a proximal branch occlusion or significant proximal stenosis. A superiorly projecting aneurysm from the anterior communicating artery/right A1-A2 junction measures 5 x 4 mm. Posterior circulation: The intracranial vertebral arteries are widely patent to the basilar. Patent PICA and SCA origins are visualized bilaterally. The basilar artery is widely patent. There is a large left posterior communicating artery with markedly hypoplastic left P1 segment. The 2 mm outpouching at the left P1  origin on today's MRA has an appearance of an infundibulum on CTA rather than an aneurysm. Both PCAs are patent with atherosclerotic type irregularity including mild distal right P2 and bilateral P3 stenoses. The appearance of more severe PCA stenoses on MRA was likely due to artifactual signal loss. Venous sinuses: Patent. Anatomic variants: None. Review of the MIP images confirms the above findings IMPRESSION: 1. Intracranial atherosclerosis primarily infecting the small branch vessels. No large vessel occlusion. 2. 5 mm anterior communicating aneurysm. 3. Cervical carotid artery  atherosclerosis without significant stenosis. Electronically Signed   By: Logan Bores M.D.   On: 01/27/2021 20:40   CT Head Wo Contrast  Result Date: 01/26/2021 CLINICAL DATA:  Delirium.  Three falls this week due to weakness. EXAM: CT HEAD WITHOUT CONTRAST TECHNIQUE: Contiguous axial images were obtained from the base of the skull through the vertex without intravenous contrast. COMPARISON:  None. FINDINGS: Brain: No evidence of acute infarction, hemorrhage, hydrocephalus, extra-axial collection or mass lesion/mass effect. Mild periventricular and deep white matter hypodensity typical of chronic small vessel ischemia. 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 Vascular: No hyperdense vessel. Skull: No fracture or focal lesion. Sinuses/Orbits: Sclerosis involving the left mandibular condyle is likely related to degenerative change at the temporomandibular joint. Paranasal sinuses are clear. No mastoid effusion. No acute orbital abnormality. Other: None. IMPRESSION: 1. No acute intracranial abnormality. 2. Mild chronic small vessel ischemia. 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 Electronically Signed   By: Keith Rake M.D.   On: 01/26/2021 18:59   CT Lumbar Spine Wo Contrast  Result Date: 01/26/2021 CLINICAL DATA:  Initial evaluation for acute weakness, inability to walk. EXAM: CT LUMBAR SPINE WITHOUT CONTRAST TECHNIQUE: Multidetector CT imaging of the lumbar spine was performed without intravenous contrast administration. Multiplanar CT image reconstructions were also generated. COMPARISON:  None available. FINDINGS: Segmentation: Standard. Lowest well-formed disc space labeled the L5-S1 level. Alignment: Mild levoscoliosis. 3 mm retrolisthesis of L1 on L2, with 8 mm anterolisthesis of L4 on L5. Findings chronic and facet mediated. No pars defect. Vertebrae: Vertebral body height  maintained without acute or chronic fracture. Visualized sacrum and pelvis intact. SI joints approximated symmetric. No discrete or worrisome osseous lesions. Paraspinal and other soft tissues: Paraspinous soft tissues demonstrate no acute finding. Moderate aorto bi-iliac atherosclerotic disease. Visualized visceral structures otherwise unremarkable. Disc levels: L1-2: 3 mm retrolisthesis. Mild annular disc bulge with bilateral facet hypertrophy. No significant spinal stenosis. Mild to moderate bilateral L1 foraminal narrowing. L2-3: Minimal annular disc bulge. Moderate right worse than left facet hypertrophy. No significant spinal stenosis. Mild to moderate bilateral L2 foraminal stenosis. L3-4: Mild disc bulge, slightly eccentric to the right. Severe bilateral facet arthrosis. Resultant mild canal with bilateral subarticular stenosis. Moderate bilateral L3 foraminal narrowing. L4-5: 8 mm anterolisthesis. Associated broad posterior pseudo disc bulge/uncovering, eccentric to the right. Severe bilateral facet arthrosis with ligament flavum hypertrophy. Resultant severe canal with bilateral subarticular stenosis. Moderate to severe bilateral L4 foraminal narrowing, left worse than right. L5-S1: Minimal disc bulge. Moderate left worse than right facet hypertrophy. No spinal stenosis. Foramina remain patent. IMPRESSION: 1. No acute abnormality within the lumbar spine. 2. 8 mm anterolisthesis of L4 on L5 with resultant severe spinal stenosis, with moderate to severe bilateral L4 foraminal narrowing, left worse than right. 3. Additional multifactorial degenerative changes at L1-2, L2-3, L3-4, and L5-S1 with resultant mild to moderate bilateral L1, L2, and L3  foraminal stenosis as above. 4. Aortic Atherosclerosis (ICD10-I70.0). Electronically Signed   By: Jeannine Boga M.D.   On: 01/26/2021 21:16   MR ANGIO HEAD WO CONTRAST  Result Date: 01/27/2021 CLINICAL DATA:  Severe left-sided weakness.  Inability to  ambulate. EXAM: MRI HEAD WITHOUT AND WITH CONTRAST MRA HEAD WITHOUT CONTRAST TECHNIQUE: Multiplanar, multiecho pulse sequences of the brain and surrounding structures were obtained without and with intravenous contrast. Angiographic images of the head were obtained using MRA technique without contrast. CONTRAST:  6.78m GADAVIST GADOBUTROL 1 MMOL/ML IV SOLN COMPARISON:  Head CT 01/26/2021 FINDINGS: MRI HEAD FINDINGS Brain: There is a moderate-sized acute distal right ACA territory infarct involving the body and splenium of the corpus callosum, right cingulate gyrus, and parasagittal right frontal and parietal lobes. Susceptibility artifact limits assessment of the posterior left cerebrum and cerebellum, mainly on diffusion-weighted and FLAIR sequences. T2 hyperintensities in the cerebral white matter bilaterally are nonspecific but compatible with mild chronic small vessel ischemic disease. Chronic lacunar infarcts are present in the bilateral basal ganglia, thalami, and cerebellum. There is mild cerebral atrophy. No abnormal enhancement is identified. Vascular: Major intracranial arterial flow voids are preserved. The major dural venous sinuses are enhancing. Skull and upper cervical spine: Unremarkable bone marrow signal. Sinuses/Orbits: Unremarkable orbits. Paranasal sinuses and mastoid air cells are clear. Other: None. MRA HEAD FINDINGS The visualized distal vertebral arteries are widely patent to the basilar with the left being slightly dominant. Patent PICA and SCA origins are identified bilaterally. The basilar artery is widely patent. There is a fetal type origin of the left PCA. A 2 mm outpouching from the distal basilar artery in the expected region of the left P1 origin may represent an aneurysm or the stump of an occluded P1 segment. There are severe distal right P2 and bilateral P3 stenoses. The internal carotid arteries are patent from skull base to carotid termini. The cavernous segments are tortuous  with atherosclerotic type irregularity, however no significant ICA stenosis is evident. ACAs and MCAs are patent with mild branch vessel irregularity but no evidence of a proximal branch occlusion or flow limiting A1 or M1 stenosis. An aneurysm projecting superiorly from the anterior communicating artery/right A1-A2 junction region measures 5 x 4 mm IMPRESSION: 1. Moderate-sized acute distal right ACA territory infarct. 2. 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 3. Intracranial atherosclerosis without large vessel occlusion in the anterior circulation. 4. 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 5. 2 mm outpouching from the distal basilar artery which may represent an aneurysm versus the stump of an occluded left P1 segment. 6. 5 mm anterior communicating aneurysm. Electronically Signed   By: ALogan BoresM.D.   On: 01/27/2021 14:38   MR BRAIN W WO CONTRAST  Result Date: 01/27/2021 CLINICAL DATA:  Severe left-sided weakness.  Inability to ambulate. EXAM: MRI HEAD WITHOUT AND WITH CONTRAST MRA HEAD WITHOUT CONTRAST TECHNIQUE: Multiplanar, multiecho pulse sequences of the brain and surrounding structures were obtained without and with intravenous contrast. Angiographic images of the head were obtained using MRA technique without contrast. CONTRAST:  6.542mGADAVIST GADOBUTROL 1 MMOL/ML IV SOLN COMPARISON:  Head CT 01/26/2021 FINDINGS: MRI HEAD FINDINGS Brain: There is a moderate-sized acute distal right ACA territory infarct involving the body and splenium of the corpus callosum, right cingulate gyrus, and parasagittal right frontal and parietal lobes. Susceptibility artifact limits assessment of the posterior left cerebrum and cerebellum, mainly on diffusion-weighted and  FLAIR sequences. T2 hyperintensities in the cerebral white matter bilaterally are nonspecific but compatible with mild chronic small vessel ischemic disease.  Chronic lacunar infarcts are present in the bilateral basal ganglia, thalami, and cerebellum. There is mild cerebral atrophy. No abnormal enhancement is identified. Vascular: Major intracranial arterial flow voids are preserved. The major dural venous sinuses are enhancing. Skull and upper cervical spine: Unremarkable bone marrow signal. Sinuses/Orbits: Unremarkable orbits. Paranasal sinuses and mastoid air cells are clear. Other: None. MRA HEAD FINDINGS The visualized distal vertebral arteries are widely patent to the basilar with the left being slightly dominant. Patent PICA and SCA origins are identified bilaterally. The basilar artery is widely patent. There is a fetal type origin of the left PCA. A 2 mm outpouching from the distal basilar artery in the expected region of the left P1 origin may represent an aneurysm or the stump of an occluded P1 segment. There are severe distal right P2 and bilateral P3 stenoses. The internal carotid arteries are patent from skull base to carotid termini. The cavernous segments are tortuous with atherosclerotic type irregularity, however no significant ICA stenosis is evident. ACAs and MCAs are patent with mild branch vessel irregularity but no evidence of a proximal branch occlusion or flow limiting A1 or M1 stenosis. An aneurysm projecting superiorly from the anterior communicating artery/right A1-A2 junction region measures 5 x 4 mm IMPRESSION: 1. Moderate-sized acute distal right ACA territory infarct. 2. 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 3. Intracranial atherosclerosis without large vessel occlusion in the anterior circulation. 4. 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 5. 2 mm outpouching from the distal basilar artery which may represent an aneurysm versus the stump of an occluded left P1 segment. 6. 5 mm anterior communicating aneurysm. Electronically Signed   By: Logan Bores M.D.   On: 01/27/2021 14:38   US Carotid Bilateral (at [facility] and [facility] only)  Result Date: 01/27/2021 CLINICAL DATA:  Ambulatory dysfunction Hypertension Hyperlipidemia Tobacco use EXAM: BILATERAL CAROTID DUPLEX ULTRASOUND TECHNIQUE: Pearline Cables scale imaging, color Doppler and duplex ultrasound were performed of bilateral carotid and vertebral arteries in the neck. COMPARISON:  None. FINDINGS: Criteria: Quantification of carotid stenosis is based on velocity parameters that correlate the residual internal carotid diameter with NASCET-based stenosis levels, using the diameter of the distal internal carotid lumen as the denominator for stenosis measurement. The following velocity measurements were obtained: RIGHT ICA: 70/22 cm/sec CCA: [id number] cm/sec SYSTOLIC ICA/CCA RATIO:  1.5 ECA: 120 cm/sec LEFT ICA: 83/14 cm/sec CCA: [id number] cm/sec SYSTOLIC ICA/CCA RATIO:  1.2 ECA: 156 cm/sec RIGHT CAROTID ARTERY: Mild atheromatous plaque of the carotid bulb extending into the internal carotid artery origin velocity parameters indicative of less than 50% stenosis. RIGHT VERTEBRAL ARTERY:  Antegrade flow. LEFT CAROTID ARTERY: Bulky heterogeneous plaque of the left carotid bifurcation with velocity parameters indicative of less than 50% stenosis. LEFT VERTEBRAL ARTERY:  Antegrade flow. IMPRESSION: Less than 50% stenosis of the internal carotid arteries. Degree of stenosis in the left internal carotid artery may be greater as bulky plaque at the origin may be obscuring higher velocities. Further evaluation with CT angiography of the neck would be beneficial. Electronically Signed   By: Miachel Roux M.D.   On: 01/27/2021 10:16   ECHOCARDIOGRAM COMPLETE  Result Date: 01/28/2021    ECHOCARDIOGRAM REPORT   Patient Name:   ZAIAH GARBARINI Date of Exam: 01/28/2021 Medical Rec #:  [id number]  Height:       64.0 in Accession #:    [id number]      Weight:       145.0 lb Date of Birth:  [birth year]        BSA:          1.706 m Patient Age:     70 years        BP:           155/105 mmHg Patient Gender: F               HR:           85 bpm. Exam Location:  [facility] Procedure: 2D Echo, Cardiac Doppler, Color Doppler and Intracardiac            Opacification Agent Indications:    CVA  History:        Patient has no prior history of Echocardiogram examinations.                 Stroke; Risk Factors:Hypertension, Dyslipidemia and Current                 Smoker. Spinal stenosis.  Sonographer:    Dustin Flock RDCS Referring Phys: [id number] ASIA B Benedict  Sonographer Comments: Technically difficult study due to poor echo windows. Spinal stenosis and CVA. IMPRESSIONS  1. Images are very limited dues to poor acoustic windows.  2. Left ventricular ejection fraction, by estimation, is 65 to 70%. The left ventricle has normal function. Left ventricular endocardial border not optimally defined to evaluate regional wall motion. There is mild left ventricular hypertrophy. Left ventricular diastolic parameters are indeterminate.  3. Right ventricular systolic function is normal. The right ventricular size is normal.  4. The mitral valve is grossly normal. Trivial mitral valve regurgitation.  5. The aortic valve was not well visualized. Aortic valve regurgitation is not visualized.  6. Unable to estimate CVP. FINDINGS  Left Ventricle: Left ventricular ejection fraction, by estimation, is 65 to 70%. The left ventricle has normal function. Left ventricular endocardial border not optimally defined to evaluate regional wall motion. Definity contrast agent was given IV to delineate the left ventricular endocardial borders. The left ventricular internal cavity size was normal in size. There is mild left ventricular hypertrophy. Left ventricular diastolic parameters are indeterminate. Right Ventricle: The right ventricular size is normal. Right vetricular wall thickness was not assessed. Right ventricular systolic function is normal. Left Atrium: Left atrial size was  normal in size. Right Atrium: Right atrial size was normal in size. Pericardium: There is no evidence of pericardial effusion. Presence of pericardial fat pad. Mitral Valve: The mitral valve is grossly normal. Trivial mitral valve regurgitation. Tricuspid Valve: The tricuspid valve is not well visualized. Tricuspid valve regurgitation is trivial. Aortic Valve: The aortic valve was not well visualized. Aortic valve regurgitation is not visualized. Pulmonic Valve: The pulmonic valve was not assessed. Pulmonic valve regurgitation is not visualized. Aorta: The aortic root was not well visualized. Venous: Unable to estimate CVP. The inferior vena cava was not well visualized. IAS/Shunts: No atrial level shunt detected by color flow Doppler.   Diastology LV e' medial:    4.13 cm/s LV E/e' medial:  9.9 LV e' lateral:   7.62 cm/s LV E/e' lateral: 5.3  RIGHT VENTRICLE RV Basal diam:  2.50 cm RV S prime:     8.70 cm/s LEFT ATRIUM             Index  RIGHT ATRIUM           Index LA Vol (A2C):   32.5 ml 19.05 ml/m RA Area:     11.40 cm LA Vol (A4C):   34.8 ml 20.39 ml/m RA Volume:   26.10 ml  15.29 ml/m LA Biplane Vol: 34.9 ml 20.45 ml/m  AORTIC VALVE LVOT Vmax:   71.10 cm/s LVOT Vmean:  44.400 cm/s LVOT VTI:    0.143 m MITRAL VALVE MV Area (PHT): 4.12 cm    SHUNTS MV Decel Time: 184 msec    Systemic VTI: 0.14 m MV E velocity: 40.70 cm/s MV A velocity: 78.40 cm/s MV E/A ratio:  0.52 Rozann Lesches MD Electronically signed by Rozann Lesches MD Signature Date/Time: 01/28/2021/11:38:20 AM    Final       Discharge Exam: Vitals:   01/30/21 0007 01/30/21 0537  BP: (!) 159/89 (!) 178/89  Pulse:  80  Resp:  18  Temp:  98 F (36.7 C)  SpO2:  92%   Vitals:   01/29/21 1952 01/29/21 2352 01/30/21 0007 01/30/21 0537  BP: (!) 132/57 (!) 179/86 (!) 159/89 (!) 178/89  Pulse: 91 81  80  Resp: '18 18  18  '$ Temp: 97.6 F (36.4 C) 98.4 F (36.9 C)  98 F (36.7 C)  TempSrc: Oral     SpO2: 91% 93%  92%  Weight:       Height:        General: Pt is alert, awake, not in acute distress, noted dysarthria Cardiovascular: RRR, S1/S2 +, no rubs, no gallops Respiratory: CTA bilaterally, no wheezing, no rhonchi Abdominal: Soft, NT, ND, bowel sounds + Extremities: no edema, no cyanosis, dense left hemiparesis    The results of significant diagnostics from this hospitalization (including imaging, microbiology, ancillary and laboratory) are listed below for reference.     Microbiology: Recent Results (from the past 240 hour(s))  Resp Panel by RT-PCR (Flu A&B, Covid) Nasopharyngeal Swab     Status: None   Collection Time: 01/26/21  8:05 PM   Specimen: Nasopharyngeal Swab; Nasopharyngeal(NP) swabs in vial transport medium  Result Value Ref Range Status   SARS Coronavirus 2 by RT PCR NEGATIVE NEGATIVE Final    Comment: (NOTE) SARS-CoV-2 target nucleic acids are NOT DETECTED.  The SARS-CoV-2 RNA is generally detectable in upper respiratory specimens during the acute phase of infection. The lowest concentration of SARS-CoV-2 viral copies this assay can detect is 138 copies/mL. A negative result does not preclude SARS-Cov-2 infection and should not be used as the sole basis for treatment or other patient management decisions. A negative result may occur with  improper specimen collection/handling, submission of specimen other than nasopharyngeal swab, presence of viral mutation(s) within the areas targeted by this assay, and inadequate number of viral copies(<138 copies/mL). A negative result must be combined with clinical observations, patient history, and epidemiological information. The expected result is Negative.  Fact Sheet for Patients:  EntrepreneurPulse.com.au  Fact Sheet for Healthcare Providers:  IncredibleEmployment.be  This test is no t yet approved or cleared by the [place] FDA and  has been authorized for detection and/or diagnosis of SARS-CoV-2  by FDA under an Emergency Use Authorization (EUA). This EUA will remain  in effect (meaning this test can be used) for the duration of the COVID-19 declaration under Section 564(b)(1) of the Act, 21 U.S.C.section 360bbb-3(b)(1), unless the authorization is terminated  or revoked sooner.       Influenza A by PCR NEGATIVE NEGATIVE Final  Influenza B by PCR NEGATIVE NEGATIVE Final    Comment: (NOTE) The Xpert Xpress SARS-CoV-2/FLU/RSV plus assay is intended as an aid in the diagnosis of influenza from Nasopharyngeal swab specimens and should not be used as a sole basis for treatment. Nasal washings and aspirates are unacceptable for Xpert Xpress SARS-CoV-2/FLU/RSV testing.  Fact Sheet for Patients: EntrepreneurPulse.com.au  Fact Sheet for Healthcare Providers: IncredibleEmployment.be  This test is not yet approved or cleared by the [place] FDA and has been authorized for detection and/or diagnosis of SARS-CoV-2 by FDA under an Emergency Use Authorization (EUA). This EUA will remain in effect (meaning this test can be used) for the duration of the COVID-19 declaration under Section 564(b)(1) of the Act, 21 U.S.C. section 360bbb-3(b)(1), unless the authorization is terminated or revoked.  Performed at [facility], [address], [place], [place] [address]      Labs: BNP (last 3 results) No results for input(s): BNP in the last 8760 hours. Basic Metabolic Panel: Recent Labs  Lab 01/26/21 1814  NA 137  K 4.1  CL 104  CO2 24  GLUCOSE 117*  BUN 19  CREATININE 1.22*  CALCIUM 9.0   Liver Function Tests: Recent Labs  Lab 01/26/21 1814  AST 21  ALT 18  ALKPHOS 48  BILITOT 1.0  PROT 7.5  ALBUMIN 4.3   No results for input(s): LIPASE, AMYLASE in the last 168 hours. No results for input(s): AMMONIA in the last 168 hours. CBC: Recent Labs  Lab 01/26/21 1814  WBC 10.9*  NEUTROABS 9.0*  HGB 14.5  HCT 45.5  MCV 104.6*   PLT 200   Cardiac Enzymes: No results for input(s): CKTOTAL, CKMB, CKMBINDEX, TROPONINI in the last 168 hours. BNP: Invalid input(s): POCBNP CBG: No results for input(s): GLUCAP in the last 168 hours. D-Dimer No results for input(s): DDIMER in the last 72 hours. Hgb A1c No results for input(s): HGBA1C in the last 72 hours. Lipid Profile No results for input(s): CHOL, HDL, LDLCALC, TRIG, CHOLHDL, LDLDIRECT in the last 72 hours. Thyroid function studies Recent Labs    01/28/21 1109  T3FREE 1.3*   Anemia work up No results for input(s): VITAMINB12, FOLATE, FERRITIN, TIBC, IRON, RETICCTPCT in the last 72 hours. Urinalysis    Component Value Date/Time   COLORURINE YELLOW 11/09/2020 1717   APPEARANCEUR CLEAR 11/09/2020 1717   LABSPEC 1.020 11/09/2020 1717   PHURINE 5.5 11/09/2020 1717   GLUCOSEU NEGATIVE 11/09/2020 1717   HGBUR NEGATIVE 11/09/2020 1717   BILIRUBINUR NEGATIVE 11/09/2020 1717   KETONESUR NEGATIVE 11/09/2020 1717   UROBILINOGEN 0.2 11/09/2020 1717   NITRITE POSITIVE (A) 11/09/2020 1717   LEUKOCYTESUR NEGATIVE 11/09/2020 1717   Sepsis Labs Invalid input(s): PROCALCITONIN,  WBC,  LACTICIDVEN Microbiology Recent Results (from the past 240 hour(s))  Resp Panel by RT-PCR (Flu A&B, Covid) Nasopharyngeal Swab     Status: None   Collection Time: 01/26/21  8:05 PM   Specimen: Nasopharyngeal Swab; Nasopharyngeal(NP) swabs in vial transport medium  Result Value Ref Range Status   SARS Coronavirus 2 by RT PCR NEGATIVE NEGATIVE Final    Comment: (NOTE) SARS-CoV-2 target nucleic acids are NOT DETECTED.  The SARS-CoV-2 RNA is generally detectable in upper respiratory specimens during the acute phase of infection. The lowest concentration of SARS-CoV-2 viral copies this assay can detect is 138 copies/mL. A negative result does not preclude SARS-Cov-2 infection and should not be used as the sole basis for treatment or other patient management decisions. A negative  result may  occur with  improper specimen collection/handling, submission of specimen other than nasopharyngeal swab, presence of viral mutation(s) within the areas targeted by this assay, and inadequate number of viral copies(<138 copies/mL). A negative result must be combined with clinical observations, patient history, and epidemiological information. The expected result is Negative.  Fact Sheet for Patients:  EntrepreneurPulse.com.au  Fact Sheet for Healthcare Providers:  IncredibleEmployment.be  This test is no t yet approved or cleared by the [place] FDA and  has been authorized for detection and/or diagnosis of SARS-CoV-2 by FDA under an Emergency Use Authorization (EUA). This EUA will remain  in effect (meaning this test can be used) for the duration of the COVID-19 declaration under Section 564(b)(1) of the Act, 21 U.S.C.section 360bbb-3(b)(1), unless the authorization is terminated  or revoked sooner.       Influenza A by PCR NEGATIVE NEGATIVE Final   Influenza B by PCR NEGATIVE NEGATIVE Final    Comment: (NOTE) The Xpert Xpress SARS-CoV-2/FLU/RSV plus assay is intended as an aid in the diagnosis of influenza from Nasopharyngeal swab specimens and should not be used as a sole basis for treatment. Nasal washings and aspirates are unacceptable for Xpert Xpress SARS-CoV-2/FLU/RSV testing.  Fact Sheet for Patients: EntrepreneurPulse.com.au  Fact Sheet for Healthcare Providers: IncredibleEmployment.be  This test is not yet approved or cleared by the [place] FDA and has been authorized for detection and/or diagnosis of SARS-CoV-2 by FDA under an Emergency Use Authorization (EUA). This EUA will remain in effect (meaning this test can be used) for the duration of the COVID-19 declaration under Section 564(b)(1) of the Act, 21 U.S.C. section 360bbb-3(b)(1), unless the authorization is  terminated or revoked.  Performed at [facility], [address] [place], [place] [address]      Time coordinating discharge: 35 minutes  SIGNED:   Rodena Goldmann, DO [facility] 01/30/2021, 10:31 AM  If 7PM-7AM, please contact night-coverage [email or web address]

## 2021-02-01 ENCOUNTER — Emergency Department (HOSPITAL_COMMUNITY)
Admission: EM | Admit: 2021-02-01 | Discharge: 2021-02-01 | Disposition: A | Discharge status: Home / Self Care | Payer: Medicare HMO | Attending: Emergency Medicine | Admitting: Emergency Medicine

## 2021-02-01 ENCOUNTER — Other Ambulatory Visit: Payer: Self-pay

## 2021-02-01 ENCOUNTER — Encounter (HOSPITAL_COMMUNITY): Payer: Self-pay | Admitting: Emergency Medicine

## 2021-02-01 ENCOUNTER — Emergency Department (HOSPITAL_COMMUNITY): Payer: Medicare HMO

## 2021-02-01 DIAGNOSIS — I129 Hypertensive chronic kidney disease with stage 1 through stage 4 chronic kidney disease, or unspecified chronic kidney disease: Secondary | ICD-10-CM | POA: Diagnosis not present

## 2021-02-01 DIAGNOSIS — R0902 Hypoxemia: Secondary | ICD-10-CM | POA: Diagnosis not present

## 2021-02-01 DIAGNOSIS — I998 Other disorder of circulatory system: Secondary | ICD-10-CM | POA: Insufficient documentation

## 2021-02-01 DIAGNOSIS — R404 Transient alteration of awareness: Secondary | ICD-10-CM | POA: Diagnosis not present

## 2021-02-01 DIAGNOSIS — R5381 Other malaise: Secondary | ICD-10-CM | POA: Diagnosis not present

## 2021-02-01 DIAGNOSIS — R531 Weakness: Secondary | ICD-10-CM | POA: Diagnosis not present

## 2021-02-01 DIAGNOSIS — F32A Depression, unspecified: Secondary | ICD-10-CM | POA: Diagnosis not present

## 2021-02-01 DIAGNOSIS — F17200 Nicotine dependence, unspecified, uncomplicated: Secondary | ICD-10-CM | POA: Diagnosis not present

## 2021-02-01 DIAGNOSIS — R209 Unspecified disturbances of skin sensation: Secondary | ICD-10-CM

## 2021-02-01 DIAGNOSIS — G2581 Restless legs syndrome: Secondary | ICD-10-CM | POA: Diagnosis not present

## 2021-02-01 DIAGNOSIS — N189 Chronic kidney disease, unspecified: Secondary | ICD-10-CM | POA: Insufficient documentation

## 2021-02-01 DIAGNOSIS — Z20822 Contact with and (suspected) exposure to covid-19: Secondary | ICD-10-CM | POA: Diagnosis not present

## 2021-02-01 DIAGNOSIS — E785 Hyperlipidemia, unspecified: Secondary | ICD-10-CM | POA: Diagnosis not present

## 2021-02-01 DIAGNOSIS — F419 Anxiety disorder, unspecified: Secondary | ICD-10-CM | POA: Diagnosis not present

## 2021-02-01 DIAGNOSIS — I10 Essential (primary) hypertension: Secondary | ICD-10-CM | POA: Diagnosis not present

## 2021-02-01 DIAGNOSIS — E039 Hypothyroidism, unspecified: Secondary | ICD-10-CM | POA: Diagnosis not present

## 2021-02-01 DIAGNOSIS — Z743 Need for continuous supervision: Secondary | ICD-10-CM | POA: Diagnosis not present

## 2021-02-01 DIAGNOSIS — R0989 Other specified symptoms and signs involving the circulatory and respiratory systems: Secondary | ICD-10-CM

## 2021-02-01 DIAGNOSIS — R230 Cyanosis: Secondary | ICD-10-CM | POA: Diagnosis not present

## 2021-02-01 DIAGNOSIS — F909 Attention-deficit hyperactivity disorder, unspecified type: Secondary | ICD-10-CM | POA: Diagnosis not present

## 2021-02-01 LAB — CBC WITH DIFFERENTIAL/PLATELET
Abs Immature Granulocytes: 0.02 10*3/uL (ref 0.00–0.07)
Basophils Absolute: 0.1 10*3/uL (ref 0.0–0.1)
Basophils Relative: 1 %
Eosinophils Absolute: 0.1 10*3/uL (ref 0.0–0.5)
Eosinophils Relative: 2 %
HCT: 49.3 % — ABNORMAL HIGH (ref 36.0–46.0)
Hemoglobin: 16.1 g/dL — ABNORMAL HIGH (ref 12.0–15.0)
Immature Granulocytes: 0 %
Lymphocytes Relative: 25 %
Lymphs Abs: 1.8 10*3/uL (ref 0.7–4.0)
MCH: 34 pg (ref 26.0–34.0)
MCHC: 32.7 g/dL (ref 30.0–36.0)
MCV: 104 fL — ABNORMAL HIGH (ref 80.0–100.0)
Monocytes Absolute: 0.7 10*3/uL (ref 0.1–1.0)
Monocytes Relative: 9 %
Neutro Abs: 4.7 10*3/uL (ref 1.7–7.7)
Neutrophils Relative %: 63 %
Platelets: 237 10*3/uL (ref 150–400)
RBC: 4.74 MIL/uL (ref 3.87–5.11)
RDW: 14.4 % (ref 11.5–15.5)
WBC: 7.4 10*3/uL (ref 4.0–10.5)
nRBC: 0 % (ref 0.0–0.2)

## 2021-02-01 LAB — RESP PANEL BY RT-PCR (FLU A&B, COVID) ARPGX2
Influenza A by PCR: NEGATIVE
Influenza B by PCR: NEGATIVE
SARS Coronavirus 2 by RT PCR: NEGATIVE

## 2021-02-01 LAB — COMPREHENSIVE METABOLIC PANEL
ALT: 17 U/L (ref 0–44)
AST: 30 U/L (ref 15–41)
Albumin: 4.1 g/dL (ref 3.5–5.0)
Alkaline Phosphatase: 42 U/L (ref 38–126)
Anion gap: 11 (ref 5–15)
BUN: 45 mg/dL — ABNORMAL HIGH (ref 8–23)
CO2: 24 mmol/L (ref 22–32)
Calcium: 9.2 mg/dL (ref 8.9–10.3)
Chloride: 101 mmol/L (ref 98–111)
Creatinine, Ser: 1.66 mg/dL — ABNORMAL HIGH (ref 0.44–1.00)
GFR, Estimated: 33 mL/min — ABNORMAL LOW (ref >60)
Glucose, Bld: 135 mg/dL — ABNORMAL HIGH (ref 70–99)
Potassium: 4.4 mmol/L (ref 3.5–5.1)
Sodium: 136 mmol/L (ref 135–145)
Total Bilirubin: 0.7 mg/dL (ref 0.3–1.2)
Total Protein: 7.4 g/dL (ref 6.5–8.1)

## 2021-02-01 LAB — PROTIME-INR
INR: 0.9 (ref 0.8–1.2)
Prothrombin Time: 12.2 seconds (ref 11.4–15.2)

## 2021-02-01 NOTE — ED Triage Notes (Signed)
[facility] called report and stated that pt's feet were black in color, cool in touch, pt couldn't feel pain to feet. Pt is new resident to SNF and she arrived yesterday  Pulse found in both feet with doppler. Cool to touch. Pt doesn't c/o of any pain in feet

## 2021-02-01 NOTE — Discharge Instructions (Signed)
You were seen in the emergency department poor circulation to the right foot.  You have strong pulses in the foot and we performed an ultrasound showing good flow to the foot.  We spoke with the vascular surgeon on-call who will be calling the nursing home tomorrow to schedule an appointment in the office with them tomorrow.  Please make this appointment.  Return to the emergency department any new or suddenly worsening symptoms such as pain or worsening circulation.

## 2021-02-01 NOTE — ED Notes (Signed)
Pt in bed, pt has purple toes, pt has positive sensation to touch, resps even and unlabored, pt oriented to day of the week and person, re oriented person to place, pt has strong pedal pulses.

## 2021-02-01 NOTE — ED Provider Notes (Signed)
Emergency Department Provider Note   I have reviewed the triage vital signs and the nursing notes.   HISTORY  Chief Complaint Circulatory Problem   HPI Susan Davidson is a 70 y.o. female with past medical history reviewed below presents to the emergency department from the [facility] with cyanosis to the right foot.   The staff report that she had decreased sensation in this area.  The patient denies any pain and has no difficulty moving the toes or foot.  She was not aware of the color difference.  She is not having pain in the thigh or leg.  No back or abdomen pain.  No chest pain or shortness of breath. No radiation of symptoms or modifying factors.   Past Medical History:  Diagnosis Date  . ANXIETY 02/12/2008   Qualifier: Diagnosis of  By: Jenny Reichmann MD, Hunt Oris   . DEPRESSION 02/12/2008   Qualifier: Diagnosis of  By: Elveria Royals   . HYPERLIPIDEMIA 02/12/2008   Qualifier: Diagnosis of  By: Elveria Royals   . HYPERTENSION 02/12/2008   Qualifier: Diagnosis of  By: Elveria Royals   . HYPOTHYROIDISM 02/12/2008   Qualifier: Diagnosis of  By: Elveria Royals   . Impaired glucose tolerance 08/27/2011  . OSTEOPENIA 02/12/2008   Qualifier: Diagnosis of  By: Jenny Reichmann MD, Hunt Oris   . VITAMIN D DEFICIENCY 04/14/2010   Qualifier: Diagnosis of  By: Jenny Reichmann MD, Hunt Oris     Patient Active Problem List   Diagnosis Date Noted  . Generalized weakness 01/27/2021  . Spinal stenosis at L4-L5 level 01/27/2021  . CVA (cerebral vascular accident) ([place]) 01/27/2021  . Ambulatory dysfunction 01/26/2021  . Tobacco use disorder 01/26/2021  . CKD (chronic kidney disease) 11/15/2020  . B12 deficiency 11/09/2020  . Urinary frequency 11/02/2018  . Daytime somnolence 10/19/2012  . Impaired glucose tolerance 08/27/2011  . Preventative health care 08/27/2011  . Vitamin D deficiency 04/14/2010  . HYPERSOMNIA 02/19/2009  . Hypothyroidism 02/12/2008  . HLD  (hyperlipidemia) 02/12/2008  . ANXIETY 02/12/2008  . Depression 02/12/2008  . Essential hypertension 02/12/2008  . OSTEOPENIA 02/12/2008    History reviewed. No pertinent surgical history.  Allergies Other, Epinephrine, and Influenza vac split quad  Family History  Problem Relation Age of Onset  . Heart disease Father   . Bipolar disorder Sister   . Diabetes Neg Hx     Social History Social History   Tobacco Use  . Smoking status: Current Every Day Smoker  . Smokeless tobacco: Never Used  Vaping Use  . Vaping Use: Never used  Substance Use Topics  . Alcohol use: Yes  . Drug use: No    Review of Systems  Constitutional: No fever/chills Cardiovascular: Denies chest pain. Respiratory: Denies shortness of breath. Gastrointestinal: No abdominal pain. No nausea, no vomiting. No diarrhea. No constipation. Musculoskeletal: Negative for back pain. Skin: Cyanosis to the right foot.  Neurological: Negative for headaches, focal weakness or numbness.  10-point ROS otherwise negative.  ____________________________________________   PHYSICAL EXAM:  VITAL SIGNS: ED Triage Vitals  Enc Vitals Group     BP 02/01/21 1607 119/65     Pulse Rate 02/01/21 1607 83     Resp 02/01/21 1607 16     Temp 02/01/21 1609 98 F (36.7 C)     Temp src --      SpO2 02/01/21 1607 97 %   Constitutional: Alert and oriented. Well appearing and in no acute distress. Eyes: Conjunctivae  are normal.  Head: Atraumatic. Nose: No congestion/rhinnorhea. Mouth/Throat: Mucous membranes are moist.   Neck: No stridor.   Cardiovascular: Normal rate, regular rhythm. Poor peripheral circulation in the distal right foot and toes with cyanosis, however, palpable DP and PT pulses bilaterally.  Respiratory: Normal respiratory effort.  No retractions. Lungs CTAB. Gastrointestinal: Soft and nontender. No distention.  Musculoskeletal: Normal ROM of bilateral lower extremities. No point tenderness or deformity.   Neurologic:  Normal speech and language. No gross focal neurologic deficits are appreciated.  Skin:  Skin is cool bilaterally with cyanosis to the toes and midfoot on the right.     ____________________________________________   LABS (all labs ordered are listed, but only abnormal results are displayed)  Labs Reviewed  COMPREHENSIVE METABOLIC PANEL - Abnormal; Notable for the following components:      Result Value   Glucose, Bld 135 (*)    BUN 45 (*)    Creatinine, Ser 1.66 (*)    GFR, Estimated 33 (*)    All other components within normal limits  CBC WITH DIFFERENTIAL/PLATELET - Abnormal; Notable for the following components:   Hemoglobin 16.1 (*)    HCT 49.3 (*)    MCV 104.0 (*)    All other components within normal limits  RESP PANEL BY RT-PCR (FLU A&B, COVID) ARPGX2  PROTIME-INR   ____________________________________________  RADIOLOGY  US ARTERIAL SEG MULTIPLE LE (ABI, SEGMENTAL PRESSURES, PVR'S)  Result Date: 02/01/2021 CLINICAL DATA:  Dusky right foot EXAM: NONINVASIVE PHYSIOLOGIC VASCULAR STUDY OF BILATERAL LOWER EXTREMITIES TECHNIQUE: Evaluation of both lower extremities were performed at rest, including calculation of ankle-brachial indices with single level Doppler, pressure and pulse volume recording. COMPARISON:  None. FINDINGS: Right ABI:  0.91 Left ABI:  0.97 Right Lower Extremity: There is a normal arterial waveform involving the posterior tibial artery. There is a dampened arterial waveform involving the dorsalis pedis artery. Left Lower Extremity:  Normal arterial waveforms are noted. IMPRESSION: ABI values are near normal bilaterally. Electronically Signed   By: Constance Holster M.D.   On: 02/01/2021 17:46    ____________________________________________   PROCEDURES  Procedure(s) performed:   Procedures  None  ____________________________________________   INITIAL IMPRESSION / ASSESSMENT AND PLAN / ED COURSE  Pertinent labs & imaging results  that were available during my care of the patient were reviewed by me and considered in my medical decision making (see chart for details).   Patient presents to the emergency department with cyanosis to the right foot.  She has palpable DP and PT pulses.  She is not complaining of pain in the foot.  The foot is cool to touch with cyanosis associated.  I sent the patient for ABI which showed near normal values bilaterally. Platelets are WNL. Labs reassuring. No COVID. Will discuss with vascular surgery regarding additional studies and/or follow up.   Lab work reviewed.  Platelets are within normal limits.  COVID is negative.  PT/INR also within normal limits.  Obtained ABI ultrasounds which showed near normal flow bilaterally.  Despite this, I did discuss the case with Dr. Oneida Alar on-call with vascular surgery given the color difference.  Reviewed imaging, labs, and picture in chart of the feet. No additional studies in the ED setting.  He will arrange follow-up in the office tomorrow.  Patient is a resident at the [facility] in [place].  Patient's husband at bedside. Discussed plan and patient clear for discharge back to facility for tonight.  ____________________________________________  FINAL CLINICAL IMPRESSION(S) / ED DIAGNOSES  Final diagnoses:  Poor circulation of extremity    Note:  This document was prepared using Dragon voice recognition software and may include unintentional dictation errors.  Nanda Quinton, MD, [facility]    Deryk Bozman, [facility], MD 02/01/21 2026

## 2021-02-01 NOTE — ED Notes (Signed)
Pt verbalized understanding d/c instructions, reviewed instructions with ems, pt from dpt

## 2021-02-01 NOTE — Telephone Encounter (Signed)
Monitor order placed and monitor being sent to SNF.

## 2021-02-01 NOTE — Telephone Encounter (Signed)
Patient was discharged to [facility] per hospital discharge summary.

## 2021-02-01 NOTE — Progress Notes (Signed)
Called by [facility] regarding this pt with dusky foot on same side as recent stroke. She has palpable pulse and good doppler signal with ABIs greater than 0.9 bilaterally. History seems like most likely altered vasomotor tone from stroke but we will see her in the office tomorrow to confirm this.  Ruta Hinds MD

## 2021-02-02 ENCOUNTER — Other Ambulatory Visit: Payer: Self-pay

## 2021-02-02 DIAGNOSIS — I679 Cerebrovascular disease, unspecified: Secondary | ICD-10-CM | POA: Diagnosis not present

## 2021-02-02 DIAGNOSIS — R238 Other skin changes: Secondary | ICD-10-CM

## 2021-02-02 DIAGNOSIS — G8194 Hemiplegia, unspecified affecting left nondominant side: Secondary | ICD-10-CM | POA: Diagnosis not present

## 2021-02-03 ENCOUNTER — Other Ambulatory Visit: Payer: Self-pay

## 2021-02-03 ENCOUNTER — Encounter: Payer: Self-pay | Admitting: Vascular Surgery

## 2021-02-03 ENCOUNTER — Ambulatory Visit (INDEPENDENT_AMBULATORY_CARE_PROVIDER_SITE_OTHER): Payer: Medicare HMO | Admitting: Vascular Surgery

## 2021-02-03 ENCOUNTER — Ambulatory Visit (HOSPITAL_COMMUNITY)
Admission: RE | Admit: 2021-02-03 | Discharge: 2021-02-03 | Disposition: A | Discharge status: Home / Self Care | Payer: Medicare HMO | Source: Ambulatory Visit | Attending: Vascular Surgery | Admitting: Vascular Surgery

## 2021-02-03 VITALS — BP 101/68 | HR 91 | Temp 97.7°F | Resp 16 | Ht 64.0 in | Wt 152.0 lb

## 2021-02-03 DIAGNOSIS — I739 Peripheral vascular disease, unspecified: Secondary | ICD-10-CM | POA: Diagnosis not present

## 2021-02-03 DIAGNOSIS — R238 Other skin changes: Secondary | ICD-10-CM | POA: Insufficient documentation

## 2021-02-03 NOTE — Progress Notes (Signed)
REASON FOR CONSULT:    To evaluate for peripheral vascular disease.  The consult is requested by the [facility] emergency department.  ASSESSMENT & PLAN:   PERIPHERAL VASCULAR DISEASE: This patient has acrocyanosis of the toes bilaterally but no evidence of significant peripheral vascular disease based on her exam and noninvasive studies of the right lower extremity.  Given her recent stroke and somewhat debilitated state I would not recommend an aggressive work-up unless she developed a nonhealing wound or severe rest pain.  She is a smoker and we have discussed the importance of tobacco cessation.  I will be happy to see her back at any time if any new vascular issues arise but I reassured her that she had excellent Doppler signals in both feet and no evidence of significant arterial occlusive disease bilaterally.  Deitra Mayo, MD Office: [phone]   HPI:   Susan Davidson is a pleasant 70 y.o. female, who had a recent stroke.  This was associated with left-sided weakness.  She was noted to have bluish discoloration of both feet but especially on the right side.  She had good Doppler signals but is sent for vascular consultation to be sure she had no significant peripheral vascular disease.  Prior to her stroke, she denied any history of claudication, rest pain, or nonhealing ulcers.   Her risk factors for peripheral vascular disease include hypertension, hyperlipidemia, and tobacco use.  She smokes 7 cigarettes a day currently.  She has been smoking since she was 70 years old.  She denies any history of diabetes or family history of premature cardiovascular disease.  Past Medical History:  Diagnosis Date  . ANXIETY 02/12/2008   Qualifier: Diagnosis of  By: Jenny Reichmann MD, Hunt Oris   . DEPRESSION 02/12/2008   Qualifier: Diagnosis of  By: Elveria Royals   . HYPERLIPIDEMIA 02/12/2008   Qualifier: Diagnosis of  By: Elveria Royals   . HYPERTENSION 02/12/2008   Qualifier:  Diagnosis of  By: Elveria Royals   . HYPOTHYROIDISM 02/12/2008   Qualifier: Diagnosis of  By: Elveria Royals   . Impaired glucose tolerance 08/27/2011  . OSTEOPENIA 02/12/2008   Qualifier: Diagnosis of  By: Jenny Reichmann MD, Hunt Oris   . VITAMIN D DEFICIENCY 04/14/2010   Qualifier: Diagnosis of  By: Jenny Reichmann MD, Hunt Oris     Family History  Problem Relation Age of Onset  . Heart disease Father   . Bipolar disorder Sister   . Diabetes Neg Hx     SOCIAL HISTORY: Social History   Socioeconomic History  . Marital status: Married    Spouse name: Not on file  . Number of children: Not on file  . Years of education: Not on file  . Highest education level: Not on file  Occupational History  . Not on file  Tobacco Use  . Smoking status: Current Every Day Smoker  . Smokeless tobacco: Never Used  Vaping Use  . Vaping Use: Never used  Substance and Sexual Activity  . Alcohol use: Yes  . Drug use: No  . Sexual activity: Not on file  Other Topics Concern  . Not on file  Social History Narrative  . Not on file   Social Determinants of Health   Financial Resource Strain: Not on file  Food Insecurity: Not on file  Transportation Needs: Not on file  Physical Activity: Not on file  Stress: Not on file  Social Connections: Not on file  Intimate Partner Violence: Not on  file    Allergies  Allergen Reactions  . Other Anaphylaxis    Fire ant venom  . Epinephrine   . Influenza Vac Split Quad     fatigue    Current Outpatient Medications  Medication Sig Dispense Refill  . ALPRAZolam (XANAX) 0.5 MG tablet Take 1 tablet (0.5 mg total) by mouth 2 (two) times daily as needed for anxiety. 10 tablet 0  . amphetamine-dextroamphetamine (ADDERALL) 20 MG tablet Take 1 tablet (20 mg total) by mouth 2 (two) times daily. 10 tablet 0  . aspirin EC 81 MG EC tablet Take 1 tablet (81 mg total) by mouth daily. Swallow whole. 30 tablet 11  . clopidogrel (PLAVIX) 75 MG tablet Take 1 tablet (75  mg total) by mouth daily. 30 tablet 0  . levothyroxine (SYNTHROID) 50 MCG tablet TAKE 1 TABLET BY MOUTH DAILY BEFORE BREAKFAST 90 tablet 3  . lisinopril (ZESTRIL) 10 MG tablet Take 1 tablet (10 mg total) by mouth daily. 90 tablet 3  . lovastatin (MEVACOR) 20 MG tablet TAKE 1 TABLET BY MOUTH EVERY EVENING AT 6 PM 90 tablet 3  . PARoxetine (PAXIL) 40 MG tablet Take 2 tablets by mouth daily.    . risperiDONE (RISPERDAL) 1 MG tablet Take 1 mg by mouth 2 (two) times daily.    . vitamin B-12 (CYANOCOBALAMIN) 1000 MCG tablet Take 1 tablet (1,000 mcg total) by mouth daily. 90 tablet 3   No current facility-administered medications for this visit.    REVIEW OF SYSTEMS:  '[X]'$  denotes positive finding, '[ ]'$  denotes negative finding Cardiac  Comments:  Chest pain or chest pressure:    Shortness of breath upon exertion:    Short of breath when lying flat:    Irregular heart rhythm:        Vascular    Pain in calf, thigh, or hip brought on by ambulation:    Pain in feet at night that wakes you up from your sleep:     Blood clot in your veins:    Leg swelling:         Pulmonary    Oxygen at home:    Productive cough:     Wheezing:         Neurologic    Sudden weakness in arms or legs:     Sudden numbness in arms or legs:     Sudden onset of difficulty speaking or slurred speech:    Temporary loss of vision in one eye:     Problems with dizziness:         Gastrointestinal    Blood in stool:     Vomited blood:         Genitourinary    Burning when urinating:     Blood in urine:        Psychiatric    Major depression:         Hematologic    Bleeding problems:    Problems with blood clotting too easily:        Skin    Rashes or ulcers:        Constitutional    Fever or chills:     PHYSICAL EXAM:   Vitals:   02/03/21 1414  BP: 101/68  Pulse: 91  Resp: 16  Temp: 97.7 F (36.5 C)  SpO2: 92%  Weight: 152 lb (68.9 kg)  Height: '5\' 4"'$  (1.626 m)    GENERAL: The patient is a  well-nourished female, in no acute distress. The vital  signs are documented above. CARDIAC: There is a regular rate and rhythm.  VASCULAR: I do not detect carotid bruits. We were unable to get her out of the wheelchair so was difficult to assess her femoral pulses.  She did have a biphasic signals in the anterior tibial and posterior tibial positions bilaterally in her feet. She has acrocyanosis in the toes bilaterally. PULMONARY: There is good air exchange bilaterally without wheezing or rales. ABDOMEN: Soft and non-tender with normal pitched bowel sounds.  MUSCULOSKELETAL: There are no major deformities or cyanosis. NEUROLOGIC: No focal weakness or paresthesias are detected. SKIN: There are no ulcers or rashes noted. PSYCHIATRIC: The patient has a normal affect.  DATA:    ARTERIAL DUPLEX: I have independently interpreted her arterial duplex scan today.  This was of the right lower extremity.  There is multiphasic flow throughout the entire right lower extremity including the anterior tibial and posterior tibial arteries.  There is a moderate stenosis in the mid superficial femoral artery suggesting a greater than 50% stenosis.

## 2021-02-04 DIAGNOSIS — I10 Essential (primary) hypertension: Secondary | ICD-10-CM | POA: Diagnosis not present

## 2021-02-04 DIAGNOSIS — Z79899 Other long term (current) drug therapy: Secondary | ICD-10-CM | POA: Diagnosis not present

## 2021-02-04 DIAGNOSIS — G8194 Hemiplegia, unspecified affecting left nondominant side: Secondary | ICD-10-CM | POA: Diagnosis not present

## 2021-02-04 DIAGNOSIS — E039 Hypothyroidism, unspecified: Secondary | ICD-10-CM | POA: Diagnosis not present

## 2021-02-04 DIAGNOSIS — E539 Vitamin B deficiency, unspecified: Secondary | ICD-10-CM | POA: Diagnosis not present

## 2021-02-05 DIAGNOSIS — E559 Vitamin D deficiency, unspecified: Secondary | ICD-10-CM | POA: Diagnosis not present

## 2021-02-05 DIAGNOSIS — E119 Type 2 diabetes mellitus without complications: Secondary | ICD-10-CM | POA: Diagnosis not present

## 2021-02-05 DIAGNOSIS — D649 Anemia, unspecified: Secondary | ICD-10-CM | POA: Diagnosis not present

## 2021-02-08 DIAGNOSIS — F419 Anxiety disorder, unspecified: Secondary | ICD-10-CM | POA: Diagnosis not present

## 2021-02-08 DIAGNOSIS — G8194 Hemiplegia, unspecified affecting left nondominant side: Secondary | ICD-10-CM | POA: Diagnosis not present

## 2021-02-08 DIAGNOSIS — F909 Attention-deficit hyperactivity disorder, unspecified type: Secondary | ICD-10-CM | POA: Diagnosis not present

## 2021-02-08 DIAGNOSIS — I679 Cerebrovascular disease, unspecified: Secondary | ICD-10-CM | POA: Diagnosis not present

## 2021-02-08 DIAGNOSIS — W19XXXD Unspecified fall, subsequent encounter: Secondary | ICD-10-CM | POA: Diagnosis not present

## 2021-02-08 DIAGNOSIS — E039 Hypothyroidism, unspecified: Secondary | ICD-10-CM | POA: Diagnosis not present

## 2021-02-22 DIAGNOSIS — I10 Essential (primary) hypertension: Secondary | ICD-10-CM | POA: Diagnosis not present

## 2021-02-22 DIAGNOSIS — E039 Hypothyroidism, unspecified: Secondary | ICD-10-CM | POA: Diagnosis not present

## 2021-02-22 DIAGNOSIS — Z79899 Other long term (current) drug therapy: Secondary | ICD-10-CM | POA: Diagnosis not present

## 2021-02-22 DIAGNOSIS — F909 Attention-deficit hyperactivity disorder, unspecified type: Secondary | ICD-10-CM | POA: Diagnosis not present

## 2021-02-22 DIAGNOSIS — F32A Depression, unspecified: Secondary | ICD-10-CM | POA: Diagnosis not present

## 2021-02-22 DIAGNOSIS — I679 Cerebrovascular disease, unspecified: Secondary | ICD-10-CM | POA: Diagnosis not present

## 2021-02-22 DIAGNOSIS — G8194 Hemiplegia, unspecified affecting left nondominant side: Secondary | ICD-10-CM | POA: Diagnosis not present

## 2021-02-24 DIAGNOSIS — D649 Anemia, unspecified: Secondary | ICD-10-CM | POA: Diagnosis not present

## 2021-02-24 DIAGNOSIS — N184 Chronic kidney disease, stage 4 (severe): Secondary | ICD-10-CM | POA: Diagnosis not present

## 2021-02-24 DIAGNOSIS — N390 Urinary tract infection, site not specified: Secondary | ICD-10-CM | POA: Diagnosis not present

## 2021-02-24 DIAGNOSIS — D509 Iron deficiency anemia, unspecified: Secondary | ICD-10-CM | POA: Diagnosis not present

## 2021-02-25 DIAGNOSIS — Z79899 Other long term (current) drug therapy: Secondary | ICD-10-CM | POA: Diagnosis not present

## 2021-02-25 DIAGNOSIS — F32A Depression, unspecified: Secondary | ICD-10-CM | POA: Diagnosis not present

## 2021-02-25 DIAGNOSIS — F909 Attention-deficit hyperactivity disorder, unspecified type: Secondary | ICD-10-CM | POA: Diagnosis not present

## 2021-02-25 DIAGNOSIS — E039 Hypothyroidism, unspecified: Secondary | ICD-10-CM | POA: Diagnosis not present

## 2021-02-25 DIAGNOSIS — I679 Cerebrovascular disease, unspecified: Secondary | ICD-10-CM | POA: Diagnosis not present

## 2021-03-01 DIAGNOSIS — F32A Depression, unspecified: Secondary | ICD-10-CM | POA: Diagnosis not present

## 2021-03-01 DIAGNOSIS — E785 Hyperlipidemia, unspecified: Secondary | ICD-10-CM | POA: Diagnosis not present

## 2021-03-01 DIAGNOSIS — I679 Cerebrovascular disease, unspecified: Secondary | ICD-10-CM | POA: Diagnosis not present

## 2021-03-01 DIAGNOSIS — G8194 Hemiplegia, unspecified affecting left nondominant side: Secondary | ICD-10-CM | POA: Diagnosis not present

## 2021-03-01 DIAGNOSIS — I10 Essential (primary) hypertension: Secondary | ICD-10-CM | POA: Diagnosis not present

## 2021-03-01 DIAGNOSIS — F419 Anxiety disorder, unspecified: Secondary | ICD-10-CM | POA: Diagnosis not present

## 2021-03-04 DIAGNOSIS — Z79899 Other long term (current) drug therapy: Secondary | ICD-10-CM | POA: Diagnosis not present

## 2021-03-04 DIAGNOSIS — F419 Anxiety disorder, unspecified: Secondary | ICD-10-CM | POA: Diagnosis not present

## 2021-03-04 DIAGNOSIS — G8194 Hemiplegia, unspecified affecting left nondominant side: Secondary | ICD-10-CM | POA: Diagnosis not present

## 2021-03-04 DIAGNOSIS — I10 Essential (primary) hypertension: Secondary | ICD-10-CM | POA: Diagnosis not present

## 2021-03-04 DIAGNOSIS — F909 Attention-deficit hyperactivity disorder, unspecified type: Secondary | ICD-10-CM | POA: Diagnosis not present

## 2021-03-05 ENCOUNTER — Emergency Department (HOSPITAL_COMMUNITY)
Admission: EM | Admit: 2021-03-05 | Discharge: 2021-03-05 | Disposition: A | Discharge status: Home / Self Care | Payer: Medicare HMO | Attending: Emergency Medicine | Admitting: Emergency Medicine

## 2021-03-05 ENCOUNTER — Encounter (HOSPITAL_COMMUNITY): Payer: Self-pay | Admitting: Emergency Medicine

## 2021-03-05 ENCOUNTER — Other Ambulatory Visit: Payer: Self-pay

## 2021-03-05 ENCOUNTER — Emergency Department (HOSPITAL_COMMUNITY): Payer: Medicare HMO

## 2021-03-05 DIAGNOSIS — M50322 Other cervical disc degeneration at C5-C6 level: Secondary | ICD-10-CM | POA: Diagnosis not present

## 2021-03-05 DIAGNOSIS — M47813 Spondylosis without myelopathy or radiculopathy, cervicothoracic region: Secondary | ICD-10-CM | POA: Diagnosis not present

## 2021-03-05 DIAGNOSIS — I129 Hypertensive chronic kidney disease with stage 1 through stage 4 chronic kidney disease, or unspecified chronic kidney disease: Secondary | ICD-10-CM | POA: Insufficient documentation

## 2021-03-05 DIAGNOSIS — F17200 Nicotine dependence, unspecified, uncomplicated: Secondary | ICD-10-CM | POA: Diagnosis not present

## 2021-03-05 DIAGNOSIS — M47812 Spondylosis without myelopathy or radiculopathy, cervical region: Secondary | ICD-10-CM | POA: Diagnosis not present

## 2021-03-05 DIAGNOSIS — Y92129 Unspecified place in nursing home as the place of occurrence of the external cause: Secondary | ICD-10-CM | POA: Insufficient documentation

## 2021-03-05 DIAGNOSIS — Z7902 Long term (current) use of antithrombotics/antiplatelets: Secondary | ICD-10-CM | POA: Insufficient documentation

## 2021-03-05 DIAGNOSIS — Z79899 Other long term (current) drug therapy: Secondary | ICD-10-CM | POA: Insufficient documentation

## 2021-03-05 DIAGNOSIS — I672 Cerebral atherosclerosis: Secondary | ICD-10-CM | POA: Diagnosis not present

## 2021-03-05 DIAGNOSIS — S0083XA Contusion of other part of head, initial encounter: Secondary | ICD-10-CM | POA: Diagnosis not present

## 2021-03-05 DIAGNOSIS — N189 Chronic kidney disease, unspecified: Secondary | ICD-10-CM | POA: Insufficient documentation

## 2021-03-05 DIAGNOSIS — M4802 Spinal stenosis, cervical region: Secondary | ICD-10-CM | POA: Diagnosis not present

## 2021-03-05 DIAGNOSIS — M5030 Other cervical disc degeneration, unspecified cervical region: Secondary | ICD-10-CM | POA: Insufficient documentation

## 2021-03-05 DIAGNOSIS — R404 Transient alteration of awareness: Secondary | ICD-10-CM | POA: Diagnosis not present

## 2021-03-05 DIAGNOSIS — W19XXXA Unspecified fall, initial encounter: Secondary | ICD-10-CM

## 2021-03-05 DIAGNOSIS — W06XXXA Fall from bed, initial encounter: Secondary | ICD-10-CM | POA: Insufficient documentation

## 2021-03-05 DIAGNOSIS — E039 Hypothyroidism, unspecified: Secondary | ICD-10-CM | POA: Diagnosis not present

## 2021-03-05 DIAGNOSIS — S199XXA Unspecified injury of neck, initial encounter: Secondary | ICD-10-CM | POA: Diagnosis not present

## 2021-03-05 DIAGNOSIS — Z7982 Long term (current) use of aspirin: Secondary | ICD-10-CM | POA: Diagnosis not present

## 2021-03-05 DIAGNOSIS — S0990XA Unspecified injury of head, initial encounter: Secondary | ICD-10-CM | POA: Diagnosis not present

## 2021-03-05 DIAGNOSIS — I10 Essential (primary) hypertension: Secondary | ICD-10-CM | POA: Diagnosis not present

## 2021-03-05 DIAGNOSIS — S0003XA Contusion of scalp, initial encounter: Secondary | ICD-10-CM | POA: Diagnosis not present

## 2021-03-05 NOTE — ED Notes (Signed)
Pt had a small BM and voided in bed pan.

## 2021-03-05 NOTE — Discharge Instructions (Addendum)
There were no serious injuries from the fall.  Use ice on the sore areas and take Tylenol for pain.  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  Follow-up with her regular doctor as needed for problems.

## 2021-03-05 NOTE — ED Triage Notes (Signed)
Pt found laying on left side in floor bedside bed be staff at [facility]. Denies pain. Hematoma to back of head noted. Patient alert and oriented x 3.

## 2021-03-05 NOTE — ED Provider Notes (Signed)
[facility] Provider Note   CSN: [id number] Arrival date & time: 03/05/21  1702     History Chief Complaint  Patient presents with  . Fall    Susan Davidson is a 70 y.o. female.  HPI She presents for evaluation of head injury.  She was found on floor at bedside at her nursing care facility.  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  She is able to give some history, recalls trying to adjust her bed, for about an hour then suddenly fell out of bed as she was attempting to do that.  She was able to summon help who arrived and she was transferred here by EMS.  She is unable to give additional history about what happened or her recent health.  Level 5 caveat-altered mental status    Past Medical History:  Diagnosis Date  . ANXIETY 02/12/2008   Qualifier: Diagnosis of  By: Jenny Reichmann MD, Hunt Oris   . DEPRESSION 02/12/2008   Qualifier: Diagnosis of  By: Elveria Royals   . HYPERLIPIDEMIA 02/12/2008   Qualifier: Diagnosis of  By: Elveria Royals   . HYPERTENSION 02/12/2008   Qualifier: Diagnosis of  By: Elveria Royals   . HYPOTHYROIDISM 02/12/2008   Qualifier: Diagnosis of  By: Elveria Royals   . Impaired glucose tolerance 08/27/2011  . OSTEOPENIA 02/12/2008   Qualifier: Diagnosis of  By: Jenny Reichmann MD, Hunt Oris   . VITAMIN D DEFICIENCY 04/14/2010   Qualifier: Diagnosis of  By: Jenny Reichmann MD, Hunt Oris     Patient Active Problem List   Diagnosis Date Noted  . Generalized weakness 01/27/2021  . Spinal stenosis at L4-L5 level 01/27/2021  . CVA (cerebral vascular accident) ([place]) 01/27/2021  . Ambulatory dysfunction 01/26/2021  . Tobacco use disorder 01/26/2021  . CKD (chronic kidney disease) 11/15/2020  . B12 deficiency 11/09/2020  . Urinary frequency 11/02/2018  . Daytime somnolence 10/19/2012  . Impaired glucose tolerance 08/27/2011  . Preventative health care 08/27/2011  . Vitamin D deficiency  04/14/2010  . HYPERSOMNIA 02/19/2009  . Hypothyroidism 02/12/2008  . HLD (hyperlipidemia) 02/12/2008  . ANXIETY 02/12/2008  . Depression 02/12/2008  . Essential hypertension 02/12/2008  . OSTEOPENIA 02/12/2008    History reviewed. No pertinent surgical history.   OB History   No obstetric history on file.     Family History  Problem Relation Age of Onset  . Heart disease Father   . Bipolar disorder Sister   . Diabetes Neg Hx     Social History   Tobacco Use  . Smoking status: Current Every Day Smoker  . Smokeless tobacco: Never Used  Vaping Use  . Vaping Use: Never used  Substance Use Topics  . Alcohol use: Yes  . Drug use: No    Home Medications Prior to Admission medications   Medication Sig Start Date End Date Taking? Authorizing Provider  ALPRAZolam Duanne Moron) 0.5 MG tablet Take 1 tablet (0.5 mg total) by mouth 2 (two) times daily as needed for anxiety. 01/30/21   Manuella Ghazi, Pratik D, DO  amphetamine-dextroamphetamine (ADDERALL) 20 MG tablet Take 1 tablet (20 mg total) by mouth 2 (two) times daily. 01/30/21   Manuella Ghazi, Pratik D, DO  aspirin EC 81 MG EC tablet Take 1 tablet (81 mg total) by mouth daily. Swallow whole. 01/31/21   Manuella Ghazi, Pratik D, DO  clopidogrel (PLAVIX) 75 MG tablet Take 1 tablet (75 mg total) by mouth daily. 01/31/21   Manuella Ghazi, Campbell  D, DO  levothyroxine (SYNTHROID) 50 MCG tablet TAKE 1 TABLET BY MOUTH DAILY BEFORE BREAKFAST 11/09/20   Biagio Borg, MD  lisinopril (ZESTRIL) 10 MG tablet Take 1 tablet (10 mg total) by mouth daily. 11/09/20   Biagio Borg, MD  lovastatin (MEVACOR) 20 MG tablet TAKE 1 TABLET BY MOUTH EVERY EVENING AT 6 PM 11/09/20   Biagio Borg, MD  PARoxetine (PAXIL) 40 MG tablet Take 2 tablets by mouth daily. 01/20/21   [provider]  risperiDONE (RISPERDAL) 1 MG tablet Take 1 mg by mouth 2 (two) times daily. 10/27/20   [provider]  vitamin B-12 (CYANOCOBALAMIN) 1000 MCG tablet Take 1 tablet (1,000 mcg total) by mouth daily. 03/14/20    Biagio Borg, MD    Allergies    Other, Epinephrine, and Influenza vac split quad  Review of Systems   Review of Systems  Unable to perform ROS: Mental status change    Physical Exam Updated Vital Signs BP (!) 194/89   Pulse 80   Temp 98.4 F (36.9 C)   Resp 17   Ht '5\' 3"'$  (1.6 m)   Wt 63.5 kg   SpO2 95%   BMI 24.80 kg/m   Physical Exam Vitals and nursing note reviewed.  Constitutional:      General: She is not in acute distress.    Appearance: She is well-developed. She is not ill-appearing, toxic-appearing or diaphoretic.  HENT:     Head: Normocephalic.     Comments: Left parietal contusion without abrasion or bleeding.    Right Ear: External ear normal.     Left Ear: External ear normal.  Eyes:     Conjunctiva/sclera: Conjunctivae normal.     Pupils: Pupils are equal, round, and reactive to light.  Neck:     Trachea: Phonation normal.     Comments: Wearing cervical collar which was applied by triage. Cardiovascular:     Rate and Rhythm: Normal rate and regular rhythm.     Heart sounds: Normal heart sounds.  Pulmonary:     Effort: Pulmonary effort is normal.     Breath sounds: Normal breath sounds.  Abdominal:     Palpations: Abdomen is soft.     Tenderness: There is no abdominal tenderness.  Musculoskeletal:        General: No swelling or tenderness. Normal range of motion.     Right lower leg: No edema.     Left lower leg: No edema.  Skin:    General: Skin is warm and dry.  Neurological:     Mental Status: She is alert.     Cranial Nerves: No cranial nerve deficit.     Sensory: No sensory deficit.     Motor: No abnormal muscle tone.     Coordination: Coordination normal.  Psychiatric:        Mood and Affect: Mood normal.        Behavior: Behavior normal.     ED Results / Procedures / Treatments   Labs (all labs ordered are listed, but only abnormal results are displayed) Labs Reviewed - No data to display  EKG None  Radiology No results  found.  Procedures Procedures   Medications Ordered in ED Medications - No data to display  ED Course  I have reviewed the triage vital signs and the nursing notes.  Pertinent labs & imaging results that were available during my care of the patient were reviewed by me and considered in my medical decision  making (see chart for details).    MDM Rules/Calculators/A&P                           Patient Vitals for the past 24 hrs:  BP Temp Pulse Resp SpO2 Height Weight  03/05/21 1737 -- -- 80 17 95 % -- --  03/05/21 1729 (!) 194/89 98.4 F (36.9 C) 81 16 95 % -- --  03/05/21 1725 -- -- -- -- -- '5\' 3"'$  (1.6 m) 63.5 kg    At time of discharge -reevaluation with update and discussion. After initial assessment and treatment, an updated evaluation reveals she is comfortable and has no further complaints--. Daleen Bo   Medical Decision Making:  This patient is presenting for evaluation of fall from bed, which does require a range of treatment options, and is a complaint that involves a moderate risk of morbidity and mortality. The differential diagnoses include contusion, intracranial injury, cervical spine injury. I decided to review old records, and in summary elderly female, rolled out of bed accidentally.  She presents with her normal mental status.  She is confused at baseline.  I did not require additional historical information from anyone.   Radiologic Tests Ordered, included CT head and CT cervical spine.  I independently Visualized: Radiographic images, which show no acute abnormalities    Critical Interventions-clinical evaluation, radiography, observation and reassessment  After These Interventions, the Patient was reevaluated and was found stable for discharge.  No acute intracranial injury or cervical spine injury.  CRITICAL CARE-no Performed by: Daleen Bo  Nursing Notes Reviewed/ Care Coordinated Applicable Imaging Reviewed Interpretation of Laboratory Data  incorporated into ED treatment  The patient appears reasonably screened and/or stabilized for discharge and I doubt any other medical condition or other [facility] requiring further screening, evaluation, or treatment in the ED at this time prior to discharge.  Plan: Home Medications-continue usual medications; Home Treatments-symptomatic treatment for headache; return here if the recommended treatment, does not improve the symptoms; Recommended follow up-PCP, as needed     Final Clinical Impression(s) / ED Diagnoses Final diagnoses:  Fall, initial encounter  Injury of head, initial encounter  Disc disease, degenerative, cervical    Rx / DC Orders ED Discharge Orders    None       Daleen Bo, MD 03/06/21 1133

## 2021-03-08 DIAGNOSIS — Z79899 Other long term (current) drug therapy: Secondary | ICD-10-CM | POA: Diagnosis not present

## 2021-03-08 DIAGNOSIS — F419 Anxiety disorder, unspecified: Secondary | ICD-10-CM | POA: Diagnosis not present

## 2021-03-08 DIAGNOSIS — F32A Depression, unspecified: Secondary | ICD-10-CM | POA: Diagnosis not present

## 2021-03-08 DIAGNOSIS — G8194 Hemiplegia, unspecified affecting left nondominant side: Secondary | ICD-10-CM | POA: Diagnosis not present

## 2021-03-08 DIAGNOSIS — E039 Hypothyroidism, unspecified: Secondary | ICD-10-CM | POA: Diagnosis not present

## 2021-03-08 DIAGNOSIS — F909 Attention-deficit hyperactivity disorder, unspecified type: Secondary | ICD-10-CM | POA: Diagnosis not present

## 2021-03-17 DIAGNOSIS — F411 Generalized anxiety disorder: Secondary | ICD-10-CM | POA: Diagnosis not present

## 2021-03-17 DIAGNOSIS — F0390 Unspecified dementia without behavioral disturbance: Secondary | ICD-10-CM | POA: Diagnosis not present

## 2021-03-17 DIAGNOSIS — F330 Major depressive disorder, recurrent, mild: Secondary | ICD-10-CM | POA: Diagnosis not present

## 2021-03-18 DIAGNOSIS — F909 Attention-deficit hyperactivity disorder, unspecified type: Secondary | ICD-10-CM | POA: Diagnosis not present

## 2021-03-18 DIAGNOSIS — Z79899 Other long term (current) drug therapy: Secondary | ICD-10-CM | POA: Diagnosis not present

## 2021-03-18 DIAGNOSIS — I10 Essential (primary) hypertension: Secondary | ICD-10-CM | POA: Diagnosis not present

## 2021-03-18 DIAGNOSIS — G8194 Hemiplegia, unspecified affecting left nondominant side: Secondary | ICD-10-CM | POA: Diagnosis not present

## 2021-03-18 DIAGNOSIS — E039 Hypothyroidism, unspecified: Secondary | ICD-10-CM | POA: Diagnosis not present

## 2021-03-18 DIAGNOSIS — E785 Hyperlipidemia, unspecified: Secondary | ICD-10-CM | POA: Diagnosis not present

## 2021-03-18 DIAGNOSIS — F32A Depression, unspecified: Secondary | ICD-10-CM | POA: Diagnosis not present

## 2021-03-18 DIAGNOSIS — F419 Anxiety disorder, unspecified: Secondary | ICD-10-CM | POA: Diagnosis not present

## 2021-03-29 DIAGNOSIS — G8194 Hemiplegia, unspecified affecting left nondominant side: Secondary | ICD-10-CM | POA: Diagnosis not present

## 2021-03-29 DIAGNOSIS — F32A Depression, unspecified: Secondary | ICD-10-CM | POA: Diagnosis not present

## 2021-03-29 DIAGNOSIS — Z79899 Other long term (current) drug therapy: Secondary | ICD-10-CM | POA: Diagnosis not present

## 2021-03-29 DIAGNOSIS — E039 Hypothyroidism, unspecified: Secondary | ICD-10-CM | POA: Diagnosis not present

## 2021-03-29 DIAGNOSIS — I679 Cerebrovascular disease, unspecified: Secondary | ICD-10-CM | POA: Diagnosis not present

## 2021-03-29 DIAGNOSIS — I10 Essential (primary) hypertension: Secondary | ICD-10-CM | POA: Diagnosis not present

## 2021-03-29 DIAGNOSIS — W19XXXD Unspecified fall, subsequent encounter: Secondary | ICD-10-CM | POA: Diagnosis not present

## 2021-03-29 DIAGNOSIS — F419 Anxiety disorder, unspecified: Secondary | ICD-10-CM | POA: Diagnosis not present

## 2021-04-07 ENCOUNTER — Encounter (HOSPITAL_COMMUNITY): Payer: Self-pay

## 2021-04-07 ENCOUNTER — Emergency Department (HOSPITAL_COMMUNITY)
Admission: EM | Admit: 2021-04-07 | Discharge: 2021-04-07 | Disposition: A | Discharge status: Home / Self Care | Payer: Medicare HMO | Attending: Emergency Medicine | Admitting: Emergency Medicine

## 2021-04-07 ENCOUNTER — Other Ambulatory Visit: Payer: Self-pay

## 2021-04-07 DIAGNOSIS — Z79899 Other long term (current) drug therapy: Secondary | ICD-10-CM | POA: Insufficient documentation

## 2021-04-07 DIAGNOSIS — F17200 Nicotine dependence, unspecified, uncomplicated: Secondary | ICD-10-CM | POA: Insufficient documentation

## 2021-04-07 DIAGNOSIS — R531 Weakness: Secondary | ICD-10-CM | POA: Diagnosis not present

## 2021-04-07 DIAGNOSIS — Z7982 Long term (current) use of aspirin: Secondary | ICD-10-CM | POA: Insufficient documentation

## 2021-04-07 DIAGNOSIS — R471 Dysarthria and anarthria: Secondary | ICD-10-CM | POA: Insufficient documentation

## 2021-04-07 DIAGNOSIS — F419 Anxiety disorder, unspecified: Secondary | ICD-10-CM | POA: Diagnosis not present

## 2021-04-07 DIAGNOSIS — I129 Hypertensive chronic kidney disease with stage 1 through stage 4 chronic kidney disease, or unspecified chronic kidney disease: Secondary | ICD-10-CM | POA: Diagnosis not present

## 2021-04-07 DIAGNOSIS — N189 Chronic kidney disease, unspecified: Secondary | ICD-10-CM | POA: Insufficient documentation

## 2021-04-07 DIAGNOSIS — I10 Essential (primary) hypertension: Secondary | ICD-10-CM | POA: Diagnosis not present

## 2021-04-07 DIAGNOSIS — E039 Hypothyroidism, unspecified: Secondary | ICD-10-CM | POA: Diagnosis not present

## 2021-04-07 LAB — CBC WITH DIFFERENTIAL/PLATELET
Abs Immature Granulocytes: 0.02 10*3/uL (ref 0.00–0.07)
Basophils Absolute: 0.1 10*3/uL (ref 0.0–0.1)
Basophils Relative: 1 %
Eosinophils Absolute: 0.2 10*3/uL (ref 0.0–0.5)
Eosinophils Relative: 2 %
HCT: 39.7 % (ref 36.0–46.0)
Hemoglobin: 12.7 g/dL (ref 12.0–15.0)
Immature Granulocytes: 0 %
Lymphocytes Relative: 26 %
Lymphs Abs: 1.8 10*3/uL (ref 0.7–4.0)
MCH: 33.3 pg (ref 26.0–34.0)
MCHC: 32 g/dL (ref 30.0–36.0)
MCV: 104.2 fL — ABNORMAL HIGH (ref 80.0–100.0)
Monocytes Absolute: 0.6 10*3/uL (ref 0.1–1.0)
Monocytes Relative: 8 %
Neutro Abs: 4.4 10*3/uL (ref 1.7–7.7)
Neutrophils Relative %: 63 %
Platelets: 266 10*3/uL (ref 150–400)
RBC: 3.81 MIL/uL — ABNORMAL LOW (ref 3.87–5.11)
RDW: 14.4 % (ref 11.5–15.5)
WBC: 6.9 10*3/uL (ref 4.0–10.5)
nRBC: 0 % (ref 0.0–0.2)

## 2021-04-07 LAB — COMPREHENSIVE METABOLIC PANEL
ALT: 12 U/L (ref 0–44)
AST: 13 U/L — ABNORMAL LOW (ref 15–41)
Albumin: 3.9 g/dL (ref 3.5–5.0)
Alkaline Phosphatase: 50 U/L (ref 38–126)
Anion gap: 6 (ref 5–15)
BUN: 28 mg/dL — ABNORMAL HIGH (ref 8–23)
CO2: 27 mmol/L (ref 22–32)
Calcium: 8.9 mg/dL (ref 8.9–10.3)
Chloride: 106 mmol/L (ref 98–111)
Creatinine, Ser: 1.21 mg/dL — ABNORMAL HIGH (ref 0.44–1.00)
GFR, Estimated: 48 mL/min — ABNORMAL LOW (ref >60)
Glucose, Bld: 120 mg/dL — ABNORMAL HIGH (ref 70–99)
Potassium: 4.1 mmol/L (ref 3.5–5.1)
Sodium: 139 mmol/L (ref 135–145)
Total Bilirubin: 0.6 mg/dL (ref 0.3–1.2)
Total Protein: 6.7 g/dL (ref 6.5–8.1)

## 2021-04-07 LAB — ETHANOL: Alcohol, Ethyl (B): <10 mg/dL (ref <10)

## 2021-04-07 MED ORDER — LORAZEPAM 1 MG PO TABS: 1.0000 mg | ORAL_TABLET | Freq: Two times a day (BID) | ORAL | 0 refills | Status: DC

## 2021-04-07 MED ORDER — LORAZEPAM 1 MG PO TABS
1.0000 mg | ORAL_TABLET | Freq: Once | ORAL | Status: AC
  Administered 2021-04-07: 1 mg via ORAL
  Filled 2021-04-07: qty 1

## 2021-04-07 NOTE — ED Triage Notes (Signed)
Pt sent to ED from [facility] for phychiatric evaluation. Per EMS pt has been uncooperative at [facility] and off her xanax x 1 month. Pt calm and cooperative at present.

## 2021-04-07 NOTE — ED Provider Notes (Signed)
[facility] EMERGENCY DEPARTMENT Provider Note   CSN: [id number] Arrival date & time: 04/07/21  1646     History Chief Complaint  Patient presents with   V70.1    Susan Davidson is a 70 y.o. female.  HPI  This patient is a 70 year old female, she presents to the hospital today with recommendations from her nursing facility at [facility] that she needs a psychiatric evaluation.  They have reported that she has been "uncooperative", states that she has been off of her Xanax for about a month, she is otherwise without complaint.  She denies chest pain shortness of breath nausea vomiting fevers chills coughing swelling of the legs rashes headaches or any other complaints.  Review of the medical record shows that the patient was seen about 1 month ago after having a fall, was seen a month before that as well as a couple of weeks before that for delirium, at that time she had dense left-sided weakness and dysarthria that was thought to be related to a acute stroke, at that point she was discharged to a nursing facility  The patient does have a known history of underlying psychiatric disorder thought to be anxiety according to the patient the patient is currently taking Risperdal, Paxil and according to the medical record alprazolam though reports from facility state that she is not taking this at this time  Past Medical History:  Diagnosis Date   ANXIETY 02/12/2008   Qualifier: Diagnosis of  By: Jenny Reichmann MD, Hunt Oris    DEPRESSION 02/12/2008   Qualifier: Diagnosis of  By: Sherwood, Butler 02/12/2008   Qualifier: Diagnosis of  By: Elveria Royals    HYPERTENSION 02/12/2008   Qualifier: Diagnosis of  By: Sherwood, C[place] 02/12/2008   Qualifier: Diagnosis of  By: Elveria Royals    Impaired glucose tolerance 08/27/2011   OSTEOPENIA 02/12/2008   Qualifier: Diagnosis of  By: Jenny Reichmann MD, Hunt Oris    VITAMIN D DEFICIENCY 04/14/2010    Qualifier: Diagnosis of  By: Jenny Reichmann MD, Hunt Oris     Patient Active Problem List   Diagnosis Date Noted   Generalized weakness 01/27/2021   Spinal stenosis at L4-L5 level 01/27/2021   CVA (cerebral vascular accident) ([place]) 01/27/2021   Ambulatory dysfunction 01/26/2021   Tobacco use disorder 01/26/2021   CKD (chronic kidney disease) 11/15/2020   B12 deficiency 11/09/2020   Urinary frequency 11/02/2018   Daytime somnolence 10/19/2012   Impaired glucose tolerance 08/27/2011   Preventative health care 08/27/2011   Vitamin D deficiency 04/14/2010   HYPERSOMNIA 02/19/2009   Hypothyroidism 02/12/2008   HLD (hyperlipidemia) 02/12/2008   ANXIETY 02/12/2008   Depression 02/12/2008   Essential hypertension 02/12/2008   OSTEOPENIA 02/12/2008    History reviewed. No pertinent surgical history.   OB History   No obstetric history on file.     Family History  Problem Relation Age of Onset   Heart disease Father    Bipolar disorder Sister    Diabetes Neg Hx     Social History   Tobacco Use   Smoking status: Every Day    Pack years: 0.00   Smokeless tobacco: Never  Vaping Use   Vaping Use: Never used  Substance Use Topics   Alcohol use: Yes   Drug use: No    Home Medications Prior to Admission medications   Medication Sig Start Date End Date Taking? Authorizing Provider  amphetamine-dextroamphetamine (ADDERALL) 20 MG tablet Take  1 tablet (20 mg total) by mouth 2 (two) times daily. 01/30/21   Manuella Ghazi, Pratik D, DO  aspirin EC 81 MG EC tablet Take 1 tablet (81 mg total) by mouth daily. Swallow whole. 01/31/21   Manuella Ghazi, Pratik D, DO  clopidogrel (PLAVIX) 75 MG tablet Take 1 tablet (75 mg total) by mouth daily. 01/31/21   Manuella Ghazi, Pratik D, DO  levothyroxine (SYNTHROID) 50 MCG tablet TAKE 1 TABLET BY MOUTH DAILY BEFORE BREAKFAST 11/09/20   Biagio Borg, MD  lisinopril (ZESTRIL) 10 MG tablet Take 1 tablet (10 mg total) by mouth daily. 11/09/20   Biagio Borg, MD  lovastatin (MEVACOR) 20 MG  tablet TAKE 1 TABLET BY MOUTH EVERY EVENING AT 6 PM 11/09/20   Biagio Borg, MD  PARoxetine (PAXIL) 40 MG tablet Take 2 tablets by mouth daily. 01/20/21   [provider]  risperiDONE (RISPERDAL) 1 MG tablet Take 1 mg by mouth 2 (two) times daily. 10/27/20   [provider]  vitamin B-12 (CYANOCOBALAMIN) 1000 MCG tablet Take 1 tablet (1,000 mcg total) by mouth daily. 03/14/20   Biagio Borg, MD    Allergies    Other, Epinephrine, and Influenza vac split quad  Review of Systems   Review of Systems  All other systems reviewed and are negative.  Physical Exam Updated Vital Signs BP (!) 147/75 (BP Location: Right Arm)   Pulse 76   Temp 98.5 F (36.9 C) (Oral)   Resp 19   Ht 1.575 m ('5\' 2"'$ )   Wt 60.3 kg   BMI 24.33 kg/m   Physical Exam Vitals and nursing note reviewed.  Constitutional:      General: She is not in acute distress.    Appearance: She is well-developed.  HENT:     Head: Normocephalic and atraumatic.     Mouth/Throat:     Pharynx: No oropharyngeal exudate.  Eyes:     General: No scleral icterus.       Right eye: No discharge.        Left eye: No discharge.     Conjunctiva/sclera: Conjunctivae normal.     Pupils: Pupils are equal, round, and reactive to light.  Neck:     Thyroid: No thyromegaly.     Vascular: No JVD.  Cardiovascular:     Rate and Rhythm: Normal rate and regular rhythm.     Heart sounds: Normal heart sounds. No murmur heard.   No friction rub. No gallop.  Pulmonary:     Effort: Pulmonary effort is normal. No respiratory distress.     Breath sounds: Normal breath sounds. No wheezing or rales.  Abdominal:     General: Bowel sounds are normal. There is no distension.     Palpations: Abdomen is soft. There is no mass.     Tenderness: There is no abdominal tenderness.  Musculoskeletal:        General: No tenderness. Normal range of motion.     Cervical back: Normal range of motion and neck supple.  Lymphadenopathy:      Cervical: No cervical adenopathy.  Skin:    General: Skin is warm and dry.     Findings: No erythema or rash.  Neurological:     Mental Status: She is alert.     Coordination: Coordination normal.  Psychiatric:        Behavior: Behavior normal.     Comments: The patient is very calm, redirectable, answers my questions to the best of her knowledge, has some  cognitive abnormalities but is not responding to any internal stimuli, she is not actively hallucinating, she is very calm, she is not violent or combative or agitated at all.    ED Results / Procedures / Treatments   Labs (all labs ordered are listed, but only abnormal results are displayed) Labs Reviewed  COMPREHENSIVE METABOLIC PANEL - Abnormal; Notable for the following components:      Result Value   Glucose, Bld 120 (*)    BUN 28 (*)    Creatinine, Ser 1.21 (*)    AST 13 (*)    GFR, Estimated 48 (*)    All other components within normal limits  CBC WITH DIFFERENTIAL/PLATELET - Abnormal; Notable for the following components:   RBC 3.81 (*)    MCV 104.2 (*)    All other components within normal limits  RESP PANEL BY RT-PCR (FLU A&B, COVID) ARPGX2  ETHANOL  RAPID URINE DRUG SCREEN, HOSP PERFORMED    EKG EKG Interpretation  Date/Time:  Wednesday April 07 2021 17:46:41 EDT Ventricular Rate:  74 PR Interval:  214 QRS Duration: 70 QT Interval:  402 QTC Calculation: 446 R Axis:   85 Text Interpretation: Sinus rhythm with 1st degree A-V block Low voltage QRS Borderline ECG Confirmed by Noemi Chapel [phone]) on 04/07/2021 6:29:43 PM  Radiology No results found.  Procedures Procedures   Medications Ordered in ED Medications  LORazepam (ATIVAN) tablet 1 mg (1 mg Oral Given 04/07/21 2011)    ED Course  I have reviewed the triage vital signs and the nursing notes.  Pertinent labs & imaging results that were available during my care of the patient were reviewed by me and considered in my medical decision making (see  chart for details).    MDM Rules/Calculators/A&P                          Come to find out within the last month the patient had her scheduled Xanax changed over to as needed Ativan, there are some shifts where she does not get any at all, the nurse at the patient's facility states that there are some nurses unsure if that do not assess her for agitation whatsoever and that she has not getting the Ativan very frequently if at all.  She also had her dose of Risperdal cut in half taking it only at night.  These 2 things likely explain the symptoms that are being complained about by the nursing staff.  We will make a change to make the Ativan scheduled again, psychiatry can follow-up with patient at facility.  There is no need for further or aggressive evaluations or interventions at this time  Updated home medication list to give Ativan twice a day scheduled, they can follow-up with his at the nursing facility  Final Clinical Impression(s) / ED Diagnoses Final diagnoses:  Anxiety    Rx / DC Orders ED Discharge Orders     None        Noemi Chapel, MD 04/07/21 2014

## 2021-04-07 NOTE — ED Notes (Signed)
Pt has been having hallucinations and climbing around the room on all 4s. Per staff she has appt tomorrow to see her psych doc there. psych doc there cut her dose of Risperdal about 4 days ago. '1mg'$  twice a day, changed '1mg'$  at bedtime. xanax d/c'd but changed to ativan but they are waiting for the ativan to be delivered to facility. staff says she was no problem before they changed her medications.

## 2021-04-07 NOTE — Discharge Instructions (Addendum)
The changes in behavior are likely related to the patient not getting the medications that she needs.  I would like for her to have Ativan, 1 mg twice a day, she is not getting this regularly and is likely the reason that she has had some decrease in her behavioral consistency.  Please start giving 1 mg of Ativan by mouth twice a day, her family doctor and psychiatrist or nurse practitioner at the facility may see her tomorrow to make this a formal order or change as they see fit.  There is no other needs for acute psychiatric interventions or admission, she is stable to go back to [facility]

## 2021-04-07 NOTE — ED Notes (Signed)
[facility] called for tranportation at this time.

## 2021-04-18 DIAGNOSIS — R41841 Cognitive communication deficit: Secondary | ICD-10-CM | POA: Diagnosis not present

## 2021-04-18 DIAGNOSIS — I63521 Cerebral infarction due to unspecified occlusion or stenosis of right anterior cerebral artery: Secondary | ICD-10-CM | POA: Diagnosis not present

## 2021-04-20 ENCOUNTER — Other Ambulatory Visit: Payer: Self-pay

## 2021-04-20 DIAGNOSIS — E039 Hypothyroidism, unspecified: Secondary | ICD-10-CM | POA: Diagnosis not present

## 2021-04-20 DIAGNOSIS — E785 Hyperlipidemia, unspecified: Secondary | ICD-10-CM | POA: Diagnosis not present

## 2021-04-20 DIAGNOSIS — I679 Cerebrovascular disease, unspecified: Secondary | ICD-10-CM | POA: Diagnosis not present

## 2021-04-20 NOTE — Patient Outreach (Signed)
[facility]) Care Management  04/20/2021  Susan Davidson 04/18/1951 [id number]   Referral Date: 04/20/21 Referral Source: [employer] Report Date of [id number]  Facility:  [facility]: [facility]   Referral received.  No outreach warranted at this time. Patient remains in SNF.     Plan: RN CM will close case.    Jone Baseman, RN, MSN [facility] Care Management Care Management Coordinator Direct Line [phone] Toll Free: [phone]  Fax: [phone]

## 2021-04-30 DIAGNOSIS — Z20828 Contact with and (suspected) exposure to other viral communicable diseases: Secondary | ICD-10-CM | POA: Diagnosis not present

## 2021-05-04 ENCOUNTER — Emergency Department (HOSPITAL_COMMUNITY): Payer: Medicare HMO

## 2021-05-04 ENCOUNTER — Other Ambulatory Visit: Payer: Self-pay

## 2021-05-04 ENCOUNTER — Encounter (HOSPITAL_COMMUNITY): Payer: Self-pay | Admitting: *Deleted

## 2021-05-04 ENCOUNTER — Inpatient Hospital Stay (HOSPITAL_COMMUNITY)
Admission: EM | Admit: 2021-05-04 | Discharge: 2021-05-07 | DRG: 177 | Disposition: A | Discharge status: Skilled Nursing Facility | Payer: Medicare HMO | Source: Skilled Nursing Facility | Attending: Internal Medicine | Admitting: Internal Medicine

## 2021-05-04 DIAGNOSIS — U071 COVID-19: Principal | ICD-10-CM | POA: Diagnosis present

## 2021-05-04 DIAGNOSIS — J1282 Pneumonia due to coronavirus disease 2019: Secondary | ICD-10-CM | POA: Diagnosis not present

## 2021-05-04 DIAGNOSIS — Z8249 Family history of ischemic heart disease and other diseases of the circulatory system: Secondary | ICD-10-CM

## 2021-05-04 DIAGNOSIS — R279 Unspecified lack of coordination: Secondary | ICD-10-CM | POA: Diagnosis not present

## 2021-05-04 DIAGNOSIS — Z888 Allergy status to other drugs, medicaments and biological substances status: Secondary | ICD-10-CM | POA: Diagnosis not present

## 2021-05-04 DIAGNOSIS — I69354 Hemiplegia and hemiparesis following cerebral infarction affecting left non-dominant side: Secondary | ICD-10-CM

## 2021-05-04 DIAGNOSIS — E782 Mixed hyperlipidemia: Secondary | ICD-10-CM | POA: Diagnosis not present

## 2021-05-04 DIAGNOSIS — Z7401 Bed confinement status: Secondary | ICD-10-CM | POA: Diagnosis not present

## 2021-05-04 DIAGNOSIS — Z79890 Hormone replacement therapy (postmenopausal): Secondary | ICD-10-CM | POA: Diagnosis not present

## 2021-05-04 DIAGNOSIS — R0602 Shortness of breath: Secondary | ICD-10-CM | POA: Diagnosis not present

## 2021-05-04 DIAGNOSIS — Z818 Family history of other mental and behavioral disorders: Secondary | ICD-10-CM | POA: Diagnosis not present

## 2021-05-04 DIAGNOSIS — Z887 Allergy status to serum and vaccine status: Secondary | ICD-10-CM

## 2021-05-04 DIAGNOSIS — F419 Anxiety disorder, unspecified: Secondary | ICD-10-CM | POA: Diagnosis present

## 2021-05-04 DIAGNOSIS — E785 Hyperlipidemia, unspecified: Secondary | ICD-10-CM | POA: Diagnosis present

## 2021-05-04 DIAGNOSIS — G9341 Metabolic encephalopathy: Secondary | ICD-10-CM | POA: Diagnosis not present

## 2021-05-04 DIAGNOSIS — R410 Disorientation, unspecified: Secondary | ICD-10-CM | POA: Diagnosis not present

## 2021-05-04 DIAGNOSIS — J189 Pneumonia, unspecified organism: Secondary | ICD-10-CM

## 2021-05-04 DIAGNOSIS — Z7982 Long term (current) use of aspirin: Secondary | ICD-10-CM | POA: Diagnosis not present

## 2021-05-04 DIAGNOSIS — N1832 Chronic kidney disease, stage 3b: Secondary | ICD-10-CM | POA: Diagnosis present

## 2021-05-04 DIAGNOSIS — Z7902 Long term (current) use of antithrombotics/antiplatelets: Secondary | ICD-10-CM

## 2021-05-04 DIAGNOSIS — I129 Hypertensive chronic kidney disease with stage 1 through stage 4 chronic kidney disease, or unspecified chronic kidney disease: Secondary | ICD-10-CM | POA: Diagnosis present

## 2021-05-04 DIAGNOSIS — E039 Hypothyroidism, unspecified: Secondary | ICD-10-CM | POA: Diagnosis not present

## 2021-05-04 DIAGNOSIS — R4182 Altered mental status, unspecified: Secondary | ICD-10-CM

## 2021-05-04 DIAGNOSIS — I10 Essential (primary) hypertension: Secondary | ICD-10-CM | POA: Diagnosis not present

## 2021-05-04 DIAGNOSIS — N183 Chronic kidney disease, stage 3 unspecified: Secondary | ICD-10-CM

## 2021-05-04 DIAGNOSIS — Z79899 Other long term (current) drug therapy: Secondary | ICD-10-CM

## 2021-05-04 DIAGNOSIS — R5381 Other malaise: Secondary | ICD-10-CM | POA: Diagnosis not present

## 2021-05-04 DIAGNOSIS — F17210 Nicotine dependence, cigarettes, uncomplicated: Secondary | ICD-10-CM | POA: Diagnosis present

## 2021-05-04 DIAGNOSIS — R404 Transient alteration of awareness: Secondary | ICD-10-CM | POA: Diagnosis not present

## 2021-05-04 DIAGNOSIS — F32A Depression, unspecified: Secondary | ICD-10-CM | POA: Diagnosis not present

## 2021-05-04 DIAGNOSIS — N390 Urinary tract infection, site not specified: Secondary | ICD-10-CM | POA: Diagnosis not present

## 2021-05-04 DIAGNOSIS — R531 Weakness: Secondary | ICD-10-CM | POA: Diagnosis not present

## 2021-05-04 LAB — CBC WITH DIFFERENTIAL/PLATELET
Abs Immature Granulocytes: 0.01 10*3/uL (ref 0.00–0.07)
Basophils Absolute: 0 10*3/uL (ref 0.0–0.1)
Basophils Relative: 1 %
Eosinophils Absolute: 0.2 10*3/uL (ref 0.0–0.5)
Eosinophils Relative: 3 %
HCT: 44.8 % (ref 36.0–46.0)
Hemoglobin: 14.2 g/dL (ref 12.0–15.0)
Immature Granulocytes: 0 %
Lymphocytes Relative: 24 %
Lymphs Abs: 1.5 10*3/uL (ref 0.7–4.0)
MCH: 32.7 pg (ref 26.0–34.0)
MCHC: 31.7 g/dL (ref 30.0–36.0)
MCV: 103.2 fL — ABNORMAL HIGH (ref 80.0–100.0)
Monocytes Absolute: 0.3 10*3/uL (ref 0.1–1.0)
Monocytes Relative: 5 %
Neutro Abs: 4.3 10*3/uL (ref 1.7–7.7)
Neutrophils Relative %: 67 %
Platelets: 330 10*3/uL (ref 150–400)
RBC: 4.34 MIL/uL (ref 3.87–5.11)
RDW: 14 % (ref 11.5–15.5)
WBC: 6.4 10*3/uL (ref 4.0–10.5)
nRBC: 0 % (ref 0.0–0.2)

## 2021-05-04 LAB — COMPREHENSIVE METABOLIC PANEL
ALT: 13 U/L (ref 0–44)
AST: 15 U/L (ref 15–41)
Albumin: 4.2 g/dL (ref 3.5–5.0)
Alkaline Phosphatase: 54 U/L (ref 38–126)
Anion gap: 8 (ref 5–15)
BUN: 26 mg/dL — ABNORMAL HIGH (ref 8–23)
CO2: 26 mmol/L (ref 22–32)
Calcium: 9 mg/dL (ref 8.9–10.3)
Chloride: 103 mmol/L (ref 98–111)
Creatinine, Ser: 1.08 mg/dL — ABNORMAL HIGH (ref 0.44–1.00)
GFR, Estimated: 55 mL/min — ABNORMAL LOW (ref >60)
Glucose, Bld: 130 mg/dL — ABNORMAL HIGH (ref 70–99)
Potassium: 4.6 mmol/L (ref 3.5–5.1)
Sodium: 137 mmol/L (ref 135–145)
Total Bilirubin: 0.6 mg/dL (ref 0.3–1.2)
Total Protein: 8 g/dL (ref 6.5–8.1)

## 2021-05-04 LAB — URINALYSIS, COMPLETE (UACMP) WITH MICROSCOPIC
Bilirubin Urine: NEGATIVE
Glucose, UA: NEGATIVE mg/dL
Hgb urine dipstick: NEGATIVE
Ketones, ur: NEGATIVE mg/dL
Nitrite: POSITIVE — AB
Protein, ur: NEGATIVE mg/dL
Specific Gravity, Urine: 1.015 (ref 1.005–1.030)
WBC, UA: >50 WBC/hpf — ABNORMAL HIGH (ref 0–5)
pH: 5 (ref 5.0–8.0)

## 2021-05-04 LAB — LACTIC ACID, PLASMA
Lactic Acid, Venous: 1.2 mmol/L (ref 0.5–1.9)
Lactic Acid, Venous: 1.3 mmol/L (ref 0.5–1.9)

## 2021-05-04 LAB — PROCALCITONIN: Procalcitonin: <0.1 ng/mL

## 2021-05-04 LAB — TRIGLYCERIDES: Triglycerides: 172 mg/dL — ABNORMAL HIGH (ref <150)

## 2021-05-04 LAB — RESP PANEL BY RT-PCR (FLU A&B, COVID) ARPGX2
Influenza A by PCR: NEGATIVE
Influenza B by PCR: NEGATIVE
SARS Coronavirus 2 by RT PCR: POSITIVE — AB

## 2021-05-04 LAB — C-REACTIVE PROTEIN: CRP: 0.5 mg/dL (ref <1.0)

## 2021-05-04 LAB — D-DIMER, QUANTITATIVE: D-Dimer, Quant: 0.72 ug/mL-FEU — ABNORMAL HIGH (ref 0.00–0.50)

## 2021-05-04 LAB — TSH: TSH: 26.064 u[IU]/mL — ABNORMAL HIGH (ref 0.350–4.500)

## 2021-05-04 LAB — FIBRINOGEN: Fibrinogen: 483 mg/dL — ABNORMAL HIGH (ref 210–475)

## 2021-05-04 LAB — LACTATE DEHYDROGENASE: LDH: 129 U/L (ref 98–192)

## 2021-05-04 LAB — FERRITIN: Ferritin: 138 ng/mL (ref 11–307)

## 2021-05-04 LAB — CBG MONITORING, ED: Glucose-Capillary: 118 mg/dL — ABNORMAL HIGH (ref 70–99)

## 2021-05-04 MED ORDER — CLOPIDOGREL BISULFATE 75 MG PO TABS
75.0000 mg | ORAL_TABLET | Freq: Every day | ORAL | Status: DC
  Administered 2021-05-05 – 2021-05-07 (×3): 75 mg via ORAL
  Filled 2021-05-04 (×3): qty 1

## 2021-05-04 MED ORDER — ONDANSETRON HCL 4 MG/2ML IJ SOLN: 4.0000 mg | Freq: Four times a day (QID) | INTRAMUSCULAR | Status: DC | PRN

## 2021-05-04 MED ORDER — SODIUM CHLORIDE 0.9 % IV SOLN
500.0000 mg | INTRAVENOUS | Status: DC
  Administered 2021-05-04 – 2021-05-05 (×2): 500 mg via INTRAVENOUS
  Filled 2021-05-04 (×2): qty 500

## 2021-05-04 MED ORDER — ACETAMINOPHEN 650 MG RE SUPP: 650.0000 mg | Freq: Four times a day (QID) | RECTAL | Status: DC | PRN

## 2021-05-04 MED ORDER — VITAMIN B-12 1000 MCG PO TABS
1000.0000 ug | ORAL_TABLET | Freq: Every day | ORAL | Status: DC
  Administered 2021-05-05 – 2021-05-07 (×3): 1000 ug via ORAL
  Filled 2021-05-04 (×3): qty 1

## 2021-05-04 MED ORDER — BEBTELOVIMAB 175 MG/2 ML IV (EUA)
175.0000 mg | Freq: Once | INTRAMUSCULAR | Status: AC
  Administered 2021-05-04: 175 mg via INTRAVENOUS
  Filled 2021-05-04: qty 2

## 2021-05-04 MED ORDER — ROSUVASTATIN CALCIUM 10 MG PO TABS
5.0000 mg | ORAL_TABLET | Freq: Every day | ORAL | Status: DC
  Administered 2021-05-05 – 2021-05-07 (×3): 5 mg via ORAL
  Filled 2021-05-04 (×3): qty 1

## 2021-05-04 MED ORDER — PAROXETINE HCL 20 MG PO TABS
20.0000 mg | ORAL_TABLET | Freq: Every day | ORAL | Status: DC
  Administered 2021-05-05 – 2021-05-07 (×3): 20 mg via ORAL
  Filled 2021-05-04 (×3): qty 1

## 2021-05-04 MED ORDER — LEVOTHYROXINE SODIUM 50 MCG PO TABS
50.0000 ug | ORAL_TABLET | Freq: Every day | ORAL | Status: DC
  Administered 2021-05-05: 50 ug via ORAL
  Filled 2021-05-04: qty 1

## 2021-05-04 MED ORDER — ONDANSETRON HCL 4 MG PO TABS: 4.0000 mg | ORAL_TABLET | Freq: Four times a day (QID) | ORAL | Status: DC | PRN

## 2021-05-04 MED ORDER — LISINOPRIL 10 MG PO TABS
10.0000 mg | ORAL_TABLET | Freq: Every day | ORAL | Status: DC
  Administered 2021-05-05 – 2021-05-07 (×3): 10 mg via ORAL
  Filled 2021-05-04 (×3): qty 1

## 2021-05-04 MED ORDER — ASPIRIN 81 MG PO CHEW
81.0000 mg | CHEWABLE_TABLET | Freq: Every day | ORAL | Status: DC
  Administered 2021-05-05 – 2021-05-07 (×3): 81 mg via ORAL
  Filled 2021-05-04 (×3): qty 1

## 2021-05-04 MED ORDER — ENOXAPARIN SODIUM 40 MG/0.4ML IJ SOSY
40.0000 mg | PREFILLED_SYRINGE | INTRAMUSCULAR | Status: DC
  Administered 2021-05-04 – 2021-05-06 (×3): 40 mg via SUBCUTANEOUS
  Filled 2021-05-04 (×3): qty 0.4

## 2021-05-04 MED ORDER — LORAZEPAM 1 MG PO TABS
1.0000 mg | ORAL_TABLET | Freq: Two times a day (BID) | ORAL | Status: DC
  Administered 2021-05-04 – 2021-05-07 (×7): 1 mg via ORAL
  Filled 2021-05-04 (×7): qty 1

## 2021-05-04 MED ORDER — CEFTRIAXONE SODIUM 1 G IJ SOLR
1.0000 g | INTRAMUSCULAR | Status: DC
  Administered 2021-05-05 – 2021-05-07 (×3): 1 g via INTRAVENOUS
  Filled 2021-05-04 (×3): qty 10

## 2021-05-04 MED ORDER — SODIUM CHLORIDE 0.9 % IV SOLN: INTRAVENOUS | Status: AC

## 2021-05-04 MED ORDER — SODIUM CHLORIDE 0.9 % IV SOLN
1.0000 g | Freq: Once | INTRAVENOUS | Status: AC
  Administered 2021-05-04: 1 g via INTRAVENOUS
  Filled 2021-05-04: qty 10

## 2021-05-04 MED ORDER — MIRTAZAPINE 15 MG PO TABS
7.5000 mg | ORAL_TABLET | Freq: Every day | ORAL | Status: DC
  Administered 2021-05-04 – 2021-05-06 (×3): 7.5 mg via ORAL
  Filled 2021-05-04 (×3): qty 1

## 2021-05-04 MED ORDER — ACETAMINOPHEN 325 MG PO TABS
650.0000 mg | ORAL_TABLET | Freq: Four times a day (QID) | ORAL | Status: DC | PRN
  Administered 2021-05-04: 650 mg via ORAL
  Filled 2021-05-04: qty 2

## 2021-05-04 NOTE — ED Notes (Signed)
Pt repositioned in bed for comfort, pt generally unhappy, pt repeatedly asking for someone to sit with her because she is scared. Pt provided verbal reassurance

## 2021-05-04 NOTE — ED Notes (Signed)
Pt here via EMS sent from [facility] where pt has resided x 1 month s/p CVA. Pt is alert and oriented x 3. Pt has left side deficit from stroke, diminished strength. Pt also tested positive for covid 10 days ago. Pt sent here for reported "altered mental status". Pt is alert and talkative, answers questions appropriately, pt denies pain, pt on cardiac monitor, pt reoprts increased fatigue.

## 2021-05-04 NOTE — ED Provider Notes (Signed)
[facility] EMERGENCY DEPARTMENT Provider Note   CSN: [id number] Arrival date & time: 05/04/21  1037     History Chief Complaint  Patient presents with   Altered Mental Status    Susan Davidson is a 70 y.o. female.   Altered Mental Status  Patient presents for altered MAP mental status.  Was diagnosed with COVID 04/29/2021.  Nursing home states she is then sleeping more than normal and acting confused and strange.  When speaking with her she is oriented to place, time, self.  She is unclear as to why she is here.  She is keeping her eyes shut, but does follow commands.  Level 5 caveat applies due to altered mental status.  Spoke with staff at [employer].  They states she has been more tired than normal, although initially they chalked this up to being COVID-positive.  She also started acting differently such as not wanting to take her medicine and being more argumentative which is not typical of her.  Denies any physical complaints.  Past Medical History:  Diagnosis Date   ANXIETY 02/12/2008   Qualifier: Diagnosis of  By: Jenny Reichmann MD, Hunt Oris    DEPRESSION 02/12/2008   Qualifier: Diagnosis of  By: Elveria Royals    HYPERLIPIDEMIA 02/12/2008   Qualifier: Diagnosis of  By: Elveria Royals    HYPERTENSION 02/12/2008   Qualifier: Diagnosis of  By: Sherwood, [place] 02/12/2008   Qualifier: Diagnosis of  By: Elveria Royals    Impaired glucose tolerance 08/27/2011   OSTEOPENIA 02/12/2008   Qualifier: Diagnosis of  By: Jenny Reichmann MD, Hunt Oris    VITAMIN D DEFICIENCY 04/14/2010   Qualifier: Diagnosis of  By: Jenny Reichmann MD, Hunt Oris     Patient Active Problem List   Diagnosis Date Noted   Generalized weakness 01/27/2021   Spinal stenosis at L4-L5 level 01/27/2021   CVA (cerebral vascular accident) ([place]) 01/27/2021   Ambulatory dysfunction 01/26/2021   Tobacco use disorder 01/26/2021   CKD (chronic kidney disease) 11/15/2020   B12 deficiency  11/09/2020   Urinary frequency 11/02/2018   Daytime somnolence 10/19/2012   Impaired glucose tolerance 08/27/2011   Preventative health care 08/27/2011   Vitamin D deficiency 04/14/2010   HYPERSOMNIA 02/19/2009   Hypothyroidism 02/12/2008   HLD (hyperlipidemia) 02/12/2008   ANXIETY 02/12/2008   Depression 02/12/2008   Essential hypertension 02/12/2008   OSTEOPENIA 02/12/2008    History reviewed. No pertinent surgical history.   OB History   No obstetric history on file.     Family History  Problem Relation Age of Onset   Heart disease Father    Bipolar disorder Sister    Diabetes Neg Hx     Social History   Tobacco Use   Smoking status: Every Day   Smokeless tobacco: Never  Vaping Use   Vaping Use: Never used  Substance Use Topics   Alcohol use: Yes   Drug use: No    Home Medications Prior to Admission medications   Medication Sig Start Date End Date Taking? Authorizing Provider  amphetamine-dextroamphetamine (ADDERALL) 20 MG tablet Take 1 tablet (20 mg total) by mouth 2 (two) times daily. 01/30/21   Manuella Ghazi, Pratik D, DO  aspirin EC 81 MG EC tablet Take 1 tablet (81 mg total) by mouth daily. Swallow whole. 01/31/21   Manuella Ghazi, Pratik D, DO  clopidogrel (PLAVIX) 75 MG tablet Take 1 tablet (75 mg total) by mouth daily. 01/31/21   Heath Lark D,  DO  levothyroxine (SYNTHROID) 50 MCG tablet TAKE 1 TABLET BY MOUTH DAILY BEFORE BREAKFAST 11/09/20   Biagio Borg, MD  lisinopril (ZESTRIL) 10 MG tablet Take 1 tablet (10 mg total) by mouth daily. 11/09/20   Biagio Borg, MD  LORazepam (ATIVAN) 1 MG tablet Take 1 tablet (1 mg total) by mouth 2 (two) times daily. 04/07/21 05/07/21  Noemi Chapel, MD  lovastatin (MEVACOR) 20 MG tablet TAKE 1 TABLET BY MOUTH EVERY EVENING AT 6 PM 11/09/20   Biagio Borg, MD  PARoxetine (PAXIL) 40 MG tablet Take 2 tablets by mouth daily. 01/20/21   [provider]  risperiDONE (RISPERDAL) 1 MG tablet Take 1 mg by mouth 2 (two) times daily. 10/27/20    [provider]  vitamin B-12 (CYANOCOBALAMIN) 1000 MCG tablet Take 1 tablet (1,000 mcg total) by mouth daily. 03/14/20   Biagio Borg, MD    Allergies    Other, Epinephrine, and Influenza vac split quad  Review of Systems   Review of Systems  Unable to perform ROS: Mental status change   Physical Exam Updated Vital Signs BP (!) 142/76   Pulse 81   Temp 98 F (36.7 C) (Oral)   Resp 16   SpO2 95%   Physical Exam Vitals and nursing note reviewed. Exam conducted with a chaperone present.  Constitutional:      General: She is not in acute distress.    Appearance: Normal appearance.  HENT:     Head: Normocephalic and atraumatic.  Eyes:     General: No scleral icterus.    Extraocular Movements: Extraocular movements intact.     Pupils: Pupils are equal, round, and reactive to light.  Skin:    Coloration: Skin is not jaundiced.  Neurological:     Mental Status: She is alert and oriented to person, place, and time. Mental status is at baseline.     Coordination: Coordination normal.     Comments: Patient is able to follow commands, no dysarthria.  There is some right-sided facial droop, this seems to be left over from previous stroke.  Grip strength is equal bilaterally   ED Results / Procedures / Treatments   Labs (all labs ordered are listed, but only abnormal results are displayed) Labs Reviewed  COMPREHENSIVE METABOLIC PANEL  CBC WITH DIFFERENTIAL/PLATELET  URINALYSIS, COMPLETE (UACMP) WITH MICROSCOPIC  CBG MONITORING, ED    EKG None  Radiology No results found.  Procedures Procedures   Medications Ordered in ED Medications - No data to display  ED Course  I have reviewed the triage vital signs and the nursing notes.  Pertinent labs & imaging results that were available during my care of the patient were reviewed by me and considered in my medical decision making (see chart for details).  Clinical Course as of 05/04/21 1443  Tue May 04, 2021   1220 D-Dimer, Quant(!): 0.72 Age adjusted is less than/equal to age adjusted dimer cut off. Doubt PE.  [HS]  [id number] Comprehensive metabolic panel(!) Creatinine at baseline, not AKI.  No anion gap, no electrolyte derangement [HS]  1222 CBC WITH DIFFERENTIAL(!) No leukocytosis, no anemia [HS]  1425 Urinalysis, Complete w Microscopic Urine, Catheterized(!) [HS]  1425 Patient has a urinary tract infection.  She is already on ceftriaxone for pneumonia so she is getting initial treatment. Will need additional abx.  [HS]    Clinical Course User Index [HS] Sherrill Raring, PA-C   MDM Rules/Calculators/A&P  Patient has altered mental status.  Work-up initiated.  She is stable and able to follow commands, no focal deficits other than the right-sided facial droop left over from her previous stroke.    Patient is COVID-positive, radiograph shows evidence of pneumonia.  She is not complaining of shortness of breath or chest pain, she is satting appropriately on room air and not in any respiratory distress.  She also has a UTI, this could be contributing to her confusion.  Will initiate antibiotic treatment and plan for admission.  Final Clinical Impression(s) / ED Diagnoses Final diagnoses:  None    Rx / DC Orders ED Discharge Orders     None        Sherrill Raring, Hershal Coria 05/04/21 1444    Isla Pence, MD 05/04/21 [phone]

## 2021-05-04 NOTE — H&P (Addendum)
History and Physical  Susan Davidson [id number] DOB: [birth year] DOA: 05/04/2021   PCP: Caprice Renshaw, MD   Patient coming from: Home  Chief Complaint: altered mental status  HPI:  Susan Davidson is a 70 y.o. 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  Patient was diagnosed COVID-positive on 04/21/2021.  Over the next couple days, the patient had increasing somnolence, confusion, and intermittent agitation.  Apparently, the patient was refusing to take some of her medications and she was being more argumentative.  This was unusual for the patient.  Because of her worsening mental status, the patient was transferred to emergency department for further evaluation.  The patient herself denies any fevers, chills, headache, chest pain, shortness breath, cough, hemoptysis, nausea, vomiting, direct abdominal pain. In the emergency department, the patient was afebrile hemodynamically stable with oxygen saturation 98-100% on room air.  BMP was unremarkable with sodium 137, potassium 4.6, serum creatinine 1.08.  AST 15, ALT 13, alk phosphatase 54, total bilirubin 0.6.  WBC 6.4, hemoglobin 14.2, platelets 230,000. UA>50 WBC.  CRP 0.5, lactic 1.2, PCT <0.10.  Chest x-ray showed groundglass opacities in the right lower lobe.  The patient was started on ceftriaxone.  Assessment/Plan: Acute metabolic encephalopathy -Secondary to UTI and COVID-19 infection -Further work-up if no improvement  UTI -Start ceftriaxone pending culture data  COVID-19 pneumonia -Stable on room air -patient essentially asymptomatic -CRP 0.5 -PCT <0.10 -give dose Bebtelovimab -check inflammatory markers in am  History of stroke -Continue Aspirin Plavix and Crestor  Essential hypertension -Continue lisinopril  Anxiety/depression -Continue paxil,  alprazolam and Remeron  Hypothyroidism -Continue Synthroid  CKD stage 3b -start IVF x 24 hours -am BMP -baseline creatinine 1.0-1.3       Past Medical History:  Diagnosis Date   ANXIETY 02/12/2008   Qualifier: Diagnosis of  By: Jenny Reichmann MD, Hunt Oris    DEPRESSION 02/12/2008   Qualifier: Diagnosis of  By: Sherwood, [place] 02/12/2008   Qualifier: Diagnosis of  By: Elveria Royals    HYPERTENSION 02/12/2008   Qualifier: Diagnosis of  By: Sherwood, [place] 02/12/2008   Qualifier: Diagnosis of  By: Elveria Royals    Impaired glucose tolerance 08/27/2011   OSTEOPENIA 02/12/2008   Qualifier: Diagnosis of  By: Jenny Reichmann MD, Hunt Oris    VITAMIN D DEFICIENCY 04/14/2010   Qualifier: Diagnosis of  By: Jenny Reichmann MD, Hunt Oris    History reviewed. No pertinent surgical history. Social History:  reports that she has been smoking. She has never used smokeless tobacco. She reports current alcohol use. She reports that she does not use drugs.   Family History  Problem Relation Age of Onset   Heart disease Father    Bipolar disorder Sister    Diabetes Neg Hx      Allergies  Allergen Reactions   Other Anaphylaxis    Fire ant venom   Epinephrine    Influenza Vac Split Quad     fatigue     Prior to Admission medications   Medication Sig Start Date End Date Taking? Authorizing Provider  amphetamine-dextroamphetamine (ADDERALL) 20 MG tablet Take 1 tablet (20 mg total) by mouth 2 (two) times daily. 01/30/21   Manuella Ghazi, Pratik D, DO  aspirin EC 81 MG EC tablet Take 1 tablet (81 mg total) by mouth daily.  Swallow whole. 01/31/21   Manuella Ghazi, Pratik D, DO  clopidogrel (PLAVIX) 75 MG tablet Take 1 tablet (75 mg total) by mouth daily. 01/31/21   Manuella Ghazi, Pratik D, DO  levothyroxine (SYNTHROID) 50 MCG tablet TAKE 1 TABLET BY MOUTH DAILY BEFORE BREAKFAST 11/09/20   Biagio Borg, MD  lisinopril (ZESTRIL) 10 MG tablet Take 1 tablet (10 mg total) by mouth daily. 11/09/20    Biagio Borg, MD  LORazepam (ATIVAN) 1 MG tablet Take 1 tablet (1 mg total) by mouth 2 (two) times daily. 04/07/21 05/07/21  Noemi Chapel, MD  lovastatin (MEVACOR) 20 MG tablet TAKE 1 TABLET BY MOUTH EVERY EVENING AT 6 PM 11/09/20   Biagio Borg, MD  mirtazapine (REMERON) 7.5 MG tablet Take 7.5 mg by mouth at bedtime. 04/29/21   [provider]  PARoxetine (PAXIL) 20 MG tablet Take 20 mg by mouth daily. 04/18/21   [provider]  PARoxetine (PAXIL) 40 MG tablet Take 2 tablets by mouth daily. 01/20/21   [provider]  risperiDONE (RISPERDAL) 1 MG tablet Take 1 mg by mouth 2 (two) times daily. 10/27/20   [provider]  rosuvastatin (CRESTOR) 5 MG tablet Take 5 mg by mouth daily. 04/27/21   [provider]  vitamin B-12 (CYANOCOBALAMIN) 1000 MCG tablet Take 1 tablet (1,000 mcg total) by mouth daily. 03/14/20   Biagio Borg, MD    Review of Systems:  Constitutional:  No weight loss, night sweats, Fevers, chills, fatigue.  Head&Eyes: No headache.  No vision loss.  No eye pain or scotoma ENT:  No Difficulty swallowing,Tooth/dental problems,Sore throat,  No ear ache, post nasal drip,  Cardio-vascular:  No chest pain, Orthopnea, PND, swelling in lower extremities,  dizziness, palpitations  GI:  No  abdominal pain, nausea, vomiting, diarrhea, loss of appetite, hematochezia, melena, heartburn, indigestion, Resp:  No shortness of breath with exertion or at rest. No cough. No coughing up of blood .No wheezing.No chest wall deformity  Skin:  no rash or lesions.  GU:  no dysuria, change in color of urine, no urgency or frequency. No flank pain.  Musculoskeletal:  No joint pain or swelling. No decreased range of motion. No back pain.  Psych:  No change in mood or affect. Neurologic: No headache, no dysesthesia, no focal weakness, no vision loss. No syncope  Physical Exam: Vitals:   05/04/21 1300 05/04/21 1330 05/04/21 1345 05/04/21 1400  BP: 132/85  138/86  (!) 151/77  Pulse: 85 78 80   Resp: '14 18 18 ' (!) 21  Temp:      TempSrc:      SpO2: 94% 95% 97%    General:  A&O x 2, NAD, nontoxic, pleasant/cooperative Head/Eye: No conjunctival hemorrhage, no icterus, [place]/AT, No nystagmus ENT:  No icterus,  No thrush, good dentition, no pharyngeal exudate Neck:  No masses, no lymphadenpathy, no bruits CV:  RRR, no rub, no gallop, no S3 Lung: Bilateral rales, regular in the left.  No wheezing Abdomen: soft/NT, +BS, nondistended, no peritoneal signs Ext: No cyanosis, No rashes, No petechiae, No lymphangitis, No edema Neuro: CNII-XII intact, strength 4/5 in bilateral upper and lower extremities, no dysmetria  Labs on Admission:  Basic Metabolic Panel: Recent Labs  Lab 05/04/21 1111  NA 137  K 4.6  CL 103  CO2 26  GLUCOSE 130*  BUN 26*  CREATININE 1.08*  CALCIUM 9.0   Liver Function Tests: Recent Labs  Lab 05/04/21 1111  AST 15  ALT 13  ALKPHOS  54  BILITOT 0.6  PROT 8.0  ALBUMIN 4.2   No results for input(s): LIPASE, AMYLASE in the last 168 hours. No results for input(s): AMMONIA in the last 168 hours. CBC: Recent Labs  Lab 05/04/21 1111  WBC 6.4  NEUTROABS 4.3  HGB 14.2  HCT 44.8  MCV 103.2*  PLT 330   Coagulation Profile: No results for input(s): INR, PROTIME in the last 168 hours. Cardiac Enzymes: No results for input(s): CKTOTAL, CKMB, CKMBINDEX, TROPONINI in the last 168 hours. BNP: Invalid input(s): POCBNP CBG: Recent Labs  Lab 05/04/21 1056  GLUCAP 118*   Urine analysis:    Component Value Date/Time   COLORURINE YELLOW 05/04/2021 1359   APPEARANCEUR CLOUDY (A) 05/04/2021 1359   LABSPEC 1.015 05/04/2021 1359   PHURINE 5.0 05/04/2021 1359   GLUCOSEU NEGATIVE 05/04/2021 1359   GLUCOSEU NEGATIVE 11/09/2020 1717   HGBUR NEGATIVE 05/04/2021 1359   BILIRUBINUR NEGATIVE 05/04/2021 1359   KETONESUR NEGATIVE 05/04/2021 1359   PROTEINUR NEGATIVE 05/04/2021 1359   UROBILINOGEN 0.2 11/09/2020 1717    NITRITE POSITIVE (A) 05/04/2021 1359   LEUKOCYTESUR LARGE (A) 05/04/2021 1359   Sepsis Labs: '@LABRCNTIP' (procalcitonin:4,lacticidven:4) ) Recent Results (from the past 240 hour(s))  Resp Panel by RT-PCR (Flu A&B, Covid) Nasopharyngeal Swab     Status: Abnormal   Collection Time: 05/04/21 11:11 AM   Specimen: Nasopharyngeal Swab; Nasopharyngeal(NP) swabs in vial transport medium  Result Value Ref Range Status   SARS Coronavirus 2 by RT PCR POSITIVE (A) NEGATIVE Final    Comment: CRITICAL RESULT CALLED TO, READ BACK BY AND VERIFIED WITH: LONG, J AT 1256 ON 8.2.22 BY RUCINSKI,B (NOTE) SARS-CoV-2 target nucleic acids are DETECTED.  The SARS-CoV-2 RNA is generally detectable in upper respiratory specimens during the acute phase of infection. Positive results are indicative of the presence of the identified virus, but do not rule out bacterial infection or co-infection with other pathogens not detected by the test. Clinical correlation with patient history and other diagnostic information is necessary to determine patient infection status. The expected result is Negative.  Fact Sheet for Patients: EntrepreneurPulse.com.au  Fact Sheet for Healthcare Providers: IncredibleEmployment.be  This test is not yet approved or cleared by the [place] FDA and  has been authorized for detection and/or diagnosis of SARS-CoV-2 by FDA under an Emergency Use Authorization (EUA).  This EUA will remain in effect (meaning t his test can be used) for the duration of  the COVID-19 declaration under Section 564(b)(1) of the Act, 21 U.S.C. section 360bbb-3(b)(1), unless the authorization is terminated or revoked sooner.     Influenza A by PCR NEGATIVE NEGATIVE Final   Influenza B by PCR NEGATIVE NEGATIVE Final    Comment: (NOTE) The Xpert Xpress SARS-CoV-2/FLU/RSV plus assay is intended as an aid in the diagnosis of influenza from Nasopharyngeal swab specimens  and should not be used as a sole basis for treatment. Nasal washings and aspirates are unacceptable for Xpert Xpress SARS-CoV-2/FLU/RSV testing.  Fact Sheet for Patients: EntrepreneurPulse.com.au  Fact Sheet for Healthcare Providers: IncredibleEmployment.be  This test is not yet approved or cleared by the [place] FDA and has been authorized for detection and/or diagnosis of SARS-CoV-2 by FDA under an Emergency Use Authorization (EUA). This EUA will remain in effect (meaning this test can be used) for the duration of the COVID-19 declaration under Section 564(b)(1) of the Act, 21 U.S.C. section 360bbb-3(b)(1), unless the authorization is terminated or revoked.  Performed at [facility], [address], [place], [place]  [address]   Blood Culture (routine x 2)     Status: None (Preliminary result)   Collection Time: 05/04/21 11:11 AM   Specimen: Right Antecubital; Blood  Result Value Ref Range Status   Specimen Description RIGHT ANTECUBITAL  Final   Special Requests   Final    BOTTLES DRAWN AEROBIC AND ANAEROBIC Blood Culture adequate volume Performed at [facility], [address] [place], [place] [address]    Culture PENDING  Incomplete   Report Status PENDING  Incomplete  Blood Culture (routine x 2)     Status: None (Preliminary result)   Collection Time: 05/04/21 11:56 AM   Specimen: Left Antecubital; Blood  Result Value Ref Range Status   Specimen Description LEFT ANTECUBITAL  Final   Special Requests   Final    BOTTLES DRAWN AEROBIC AND ANAEROBIC Blood Culture adequate volume Performed at [facility], [address], [place], [place] [address]    Culture PENDING  Incomplete   Report Status PENDING  Incomplete     Radiological Exams on Admission: DG Chest Port 1 View  Result Date: 05/04/2021 CLINICAL DATA:  COVID positive, lethargy EXAM: PORTABLE CHEST 1 VIEW COMPARISON:  None. FINDINGS: The heart size and mediastinal contours  are within normal limits. Minimal, subtle ground-glass opacity, most conspicuous in the peripheral right lung and right lung base. The visualized skeletal structures are unremarkable. IMPRESSION: Minimal, subtle ground-glass opacity, most conspicuous in the peripheral right lung and right lung base, generally consistent with reported history of COVID-19. Electronically Signed   By: Eddie Candle M.D.   On: 05/04/2021 12:02    EKG: Independently reviewed. Sinus, nonspecific ST change    Time spent:60 minutes Code Status:   FULL Family Communication:  No Family at bedside Disposition Plan: expect 2day hospitalization Consults called: none  DVT Prophylaxis: [place] Lovenox  Orson Eva, DO  [facility] Pager [phone]  If 7PM-7AM, please contact night-coverage [email or web address] Password [email or web address] 05/04/2021, 3:08 PM

## 2021-05-04 NOTE — ED Triage Notes (Addendum)
Pt Covid positive reported on 7/28 and EMS transported here for being lethargic per EMS report, pt able to answer questions.  100 % on RA, HR 76 BP 120/70 per EMS. Pt is from [place].

## 2021-05-04 NOTE — ED Notes (Signed)
Gave pt additional pillow

## 2021-05-04 NOTE — ED Notes (Signed)
Pt transported for CT 

## 2021-05-04 NOTE — ED Notes (Signed)
Pt very anxious, constantly calling out and redirected.

## 2021-05-05 ENCOUNTER — Other Ambulatory Visit: Payer: Self-pay

## 2021-05-05 DIAGNOSIS — N1832 Chronic kidney disease, stage 3b: Secondary | ICD-10-CM

## 2021-05-05 DIAGNOSIS — N390 Urinary tract infection, site not specified: Secondary | ICD-10-CM

## 2021-05-05 DIAGNOSIS — G9341 Metabolic encephalopathy: Secondary | ICD-10-CM

## 2021-05-05 DIAGNOSIS — R0602 Shortness of breath: Secondary | ICD-10-CM

## 2021-05-05 DIAGNOSIS — U071 COVID-19: Principal | ICD-10-CM

## 2021-05-05 DIAGNOSIS — I10 Essential (primary) hypertension: Secondary | ICD-10-CM

## 2021-05-05 LAB — CBC
HCT: 41.3 % (ref 36.0–46.0)
Hemoglobin: 13.1 g/dL (ref 12.0–15.0)
MCH: 32.8 pg (ref 26.0–34.0)
MCHC: 31.7 g/dL (ref 30.0–36.0)
MCV: 103.5 fL — ABNORMAL HIGH (ref 80.0–100.0)
Platelets: 342 10*3/uL (ref 150–400)
RBC: 3.99 MIL/uL (ref 3.87–5.11)
RDW: 13.9 % (ref 11.5–15.5)
WBC: 7.3 10*3/uL (ref 4.0–10.5)
nRBC: 0 % (ref 0.0–0.2)

## 2021-05-05 LAB — COMPREHENSIVE METABOLIC PANEL
ALT: 12 U/L (ref 0–44)
AST: 16 U/L (ref 15–41)
Albumin: 3.8 g/dL (ref 3.5–5.0)
Alkaline Phosphatase: 49 U/L (ref 38–126)
Anion gap: 8 (ref 5–15)
BUN: 22 mg/dL (ref 8–23)
CO2: 25 mmol/L (ref 22–32)
Calcium: 8.8 mg/dL — ABNORMAL LOW (ref 8.9–10.3)
Chloride: 105 mmol/L (ref 98–111)
Creatinine, Ser: 0.99 mg/dL (ref 0.44–1.00)
GFR, Estimated: >60 mL/min (ref >60)
Glucose, Bld: 114 mg/dL — ABNORMAL HIGH (ref 70–99)
Potassium: 4.4 mmol/L (ref 3.5–5.1)
Sodium: 138 mmol/L (ref 135–145)
Total Bilirubin: 0.5 mg/dL (ref 0.3–1.2)
Total Protein: 7.3 g/dL (ref 6.5–8.1)

## 2021-05-05 LAB — D-DIMER, QUANTITATIVE: D-Dimer, Quant: 0.77 ug/mL-FEU — ABNORMAL HIGH (ref 0.00–0.50)

## 2021-05-05 LAB — FERRITIN: Ferritin: 135 ng/mL (ref 11–307)

## 2021-05-05 LAB — VITAMIN B12: Vitamin B-12: 1196 pg/mL — ABNORMAL HIGH (ref 180–914)

## 2021-05-05 LAB — TSH: TSH: 20.46 u[IU]/mL — ABNORMAL HIGH (ref 0.350–4.500)

## 2021-05-05 LAB — C-REACTIVE PROTEIN: CRP: 0.5 mg/dL (ref <1.0)

## 2021-05-05 MED ORDER — LEVOTHYROXINE SODIUM 75 MCG PO TABS
75.0000 ug | ORAL_TABLET | Freq: Every day | ORAL | Status: DC
  Administered 2021-05-06 – 2021-05-07 (×2): 75 ug via ORAL
  Filled 2021-05-05 (×2): qty 1

## 2021-05-05 NOTE — Progress Notes (Signed)
PROGRESS NOTE    Susan Davidson  UUE:[id number] DOB: 20-May-1951 DOA: 05/04/2021 PCP: Caprice Renshaw, MD    Chief Complaint  Patient presents with   Altered Mental Status    Brief admission Narrative:  Susan Davidson is a 70 y.o. 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  Patient was diagnosed COVID-positive on 04/21/2021.  Over the next couple days, the patient had increasing somnolence, confusion, and intermittent agitation.  Apparently, the patient was refusing to take some of her medications and she was being more argumentative.  This was unusual for the patient.  Because of her worsening mental status, the patient was transferred to emergency department for further evaluation.  The patient herself denies any fevers, chills, headache, chest pain, shortness breath, cough, hemoptysis, nausea, vomiting, direct abdominal pain. In the emergency department, the patient was afebrile hemodynamically stable with oxygen saturation 98-100% on room air.  BMP was unremarkable with sodium 137, potassium 4.6, serum creatinine 1.08.  AST 15, ALT 13, alk phosphatase 54, total bilirubin 0.6.  WBC 6.4, hemoglobin 14.2, platelets 230,000. UA>50 WBC.  CRP 0.5, lactic 1.2, PCT <0.10.  Chest x-ray showed groundglass opacities in the right lower lobe.  The patient was started on ceftriaxone.  Assessment & Plan: 1-acute metabolic encephalopathy -In the setting of UTI -Continue fluid resuscitation and supportive care -Constant reorientation and minimize the use of sedative agents. -Empirically started on Rocephin -Follow culture results. -Will also check B12 and adjust thyroid dose due to elevated TSH.  2-COVID-19 infection -Patient is stable on room air -Diagnosed with [facility] [place] about 1 week prior to admission at her skilled nursing facility. -Given  risk factors patient treated with monoclonal antibody infusion -CRP and procalcitonin stable and suggesting no bacterial superinfection. -Continue as needed bronchodilators and supportive care.  3-history of stroke -No new focal deficits -Continue secondary prevention with the use of aspirin, Plavix and statins.  4-history of anxiety/depression -Continue home Paxil and as needed alprazolam -Overall mood stable.  5-hypothyroidism -TSH above 26 currently -Synthroid dose adjusted to 75 mcg daily. -Repeat thyroid panel in 6 weeks.  6-chronic kidney disease a stage IIIb -Baseline creatinine 1-1.3 -Continue IV fluids and supportive care -Follow renal function trend with repeat basic metabolic panel in AM.  Active Problems:   HLD (hyperlipidemia)   Essential hypertension   Acute metabolic encephalopathy   CKD (chronic kidney disease) stage 3, GFR 30-59 ml/min (HCC)   Pneumonia due to COVID-19 virus      DVT prophylaxis: lovenox Code Status: Full Code. Family Communication: no family at bedside. Disposition:   Status is: Inpatient  Remains inpatient appropriate because:IV treatments appropriate due to intensity of illness or inability to take PO  Dispo: The patient is from: SNF              Anticipated d/c is to: SNF              Patient currently is not medically stable to d/c.   Difficult to place patient No    Consultants:  None   Procedures:  See below for x-ray reports.   Antimicrobials:  Rocephin   Subjective: Afebrile, mentation still impaired and not back to baseline. Denies CP, nausea and vomiting.   Objective: Vitals:   05/05/21 0500 05/05/21 0606 05/05/21 0800 05/05/21 1505  BP: 114/60 (!) 183/88 (!) 158/89 (!) 164/70  Pulse:  86 79  73  Resp: 16 (!) _0 Temp:  98.6 F (37 C)  97.7 F (36.5 C)  TempSrc:    Oral  SpO2:  95% 97%   Weight:      Height:        Intake/Output Summary (Last 24 hours) at 05/05/2021 1923 Last data filed at  05/05/2021 1835 Gross per 24 hour  Intake 1600 ml  Output --  Net 1600 ml   Filed Weights   05/04/21 1638  Weight: 60 kg    Examination: General exam: Afebrile, no CP, no nausea, no vomiting. Intermittently confused and disoriented. Respiratory system: Clear to auscultation. Respiratory effort normal. No requiring oxygen saturation. Cardiovascular system: S1 & S2 heard, RRR. No JVD, murmurs, rubs, gallops or clicks. No pedal edema. Gastrointestinal system: Abdomen is nondistended, soft and nontender. No organomegaly or masses felt. Normal bowel sounds heard. Central nervous system: Alert and oriented. No focal neurological deficits. Extremities: no cyanosis, no clubbing.  Skin: No rashes, lesions or ulcers Psychiatry: Judgement and insight appear impaired in the setting of acute metabolic encephalopathy.   Data Reviewed: I have personally reviewed following labs and imaging studies  CBC: Recent Labs  Lab 05/04/21 1111 05/05/21 0426  WBC 6.4 7.3  NEUTROABS 4.3  --   HGB 14.2 13.1  HCT 44.8 41.3  MCV 103.2* 103.5*  PLT 330 606    Basic Metabolic Panel: Recent Labs  Lab 05/04/21 1111 05/05/21 0426  NA 137 138  K 4.6 4.4  CL 103 105  CO2 26 25  GLUCOSE 130* 114*  BUN 26* 22  CREATININE 1.08* 0.99  CALCIUM 9.0 8.8*    GFR: Estimated Creatinine Clearance: 40.6 mL/min (by C-G formula based on SCr of 0.99 mg/dL).  Liver Function Tests: Recent Labs  Lab 05/04/21 1111 05/05/21 0426  AST 15 16  ALT 13 12  ALKPHOS 54 49  BILITOT 0.6 0.5  PROT 8.0 7.3  ALBUMIN 4.2 3.8    CBG: Recent Labs  Lab 05/04/21 1056  GLUCAP 118*    Recent Results (from the past 240 hour(s))  Resp Panel by RT-PCR (Flu A&B, Covid) Nasopharyngeal Swab     Status: Abnormal   Collection Time: 05/04/21 11:11 AM   Specimen: Nasopharyngeal Swab; Nasopharyngeal(NP) swabs in vial transport medium  Result Value Ref Range Status   SARS Coronavirus 2 by RT PCR POSITIVE (A) NEGATIVE Final     Comment: CRITICAL RESULT CALLED TO, READ BACK BY AND VERIFIED WITH: LONG, J AT 1256 ON 8.2.22 BY RUCINSKI,B (NOTE) SARS-CoV-2 target nucleic acids are DETECTED.  The SARS-CoV-2 RNA is generally detectable in upper respiratory specimens during the acute phase of infection. Positive results are indicative of the presence of the identified virus, but do not rule out bacterial infection or co-infection with other pathogens not detected by the test. Clinical correlation with patient history and other diagnostic information is necessary to determine patient infection status. The expected result is Negative.  Fact Sheet for Patients: EntrepreneurPulse.com.au  Fact Sheet for Healthcare Providers: IncredibleEmployment.be  This test is not yet approved or cleared by the [place] FDA and  has been authorized for detection and/or diagnosis of SARS-CoV-2 by FDA under an Emergency Use Authorization (EUA).  This EUA will remain in effect (meaning t his test can be used) for the duration of  the COVID-19 declaration under Section 564(b)(1) of the Act, 21 U.S.C. section 360bbb-3(b)(1), unless the authorization is terminated or revoked sooner.     Influenza A by  PCR NEGATIVE NEGATIVE Final   Influenza B by PCR NEGATIVE NEGATIVE Final    Comment: (NOTE) The Xpert Xpress SARS-CoV-2/FLU/RSV plus assay is intended as an aid in the diagnosis of influenza from Nasopharyngeal swab specimens and should not be used as a sole basis for treatment. Nasal washings and aspirates are unacceptable for Xpert Xpress SARS-CoV-2/FLU/RSV testing.  Fact Sheet for Patients: EntrepreneurPulse.com.au  Fact Sheet for Healthcare Providers: IncredibleEmployment.be  This test is not yet approved or cleared by the [place] FDA and has been authorized for detection and/or diagnosis of SARS-CoV-2 by FDA under an Emergency Use Authorization  (EUA). This EUA will remain in effect (meaning this test can be used) for the duration of the COVID-19 declaration under Section 564(b)(1) of the Act, 21 U.S.C. section 360bbb-3(b)(1), unless the authorization is terminated or revoked.  Performed at [facility], [address], [place], [place] [address]   Blood Culture (routine x 2)     Status: None (Preliminary result)   Collection Time: 05/04/21 11:11 AM   Specimen: Right Antecubital; Blood  Result Value Ref Range Status   Specimen Description RIGHT ANTECUBITAL  Final   Special Requests   Final    BOTTLES DRAWN AEROBIC AND ANAEROBIC Blood Culture adequate volume   Culture   Final    NO GROWTH < 24 HOURS Performed at [facility], [address], [place], [place] [address]    Report Status PENDING  Incomplete  Blood Culture (routine x 2)     Status: None (Preliminary result)   Collection Time: 05/04/21 11:56 AM   Specimen: Left Antecubital; Blood  Result Value Ref Range Status   Specimen Description LEFT ANTECUBITAL  Final   Special Requests   Final    BOTTLES DRAWN AEROBIC AND ANAEROBIC Blood Culture adequate volume   Culture   Final    NO GROWTH < 24 HOURS Performed at [facility], [address] [place], [place] [address]    Report Status PENDING  Incomplete     Radiology Studies: CT HEAD WO CONTRAST  Result Date: 05/04/2021 CLINICAL DATA:  Mental status changes.  Known COVID positive. EXAM: CT HEAD WITHOUT CONTRAST TECHNIQUE: Contiguous axial images were obtained from the base of the skull through the vertex without intravenous contrast. COMPARISON:  03/05/2021 FINDINGS: Brain: Mild low density in the periventricular white matter likely related to small vessel disease. Remote right parietal vertex infarct again identified. No mass lesion, hemorrhage, hydrocephalus, acute infarct, intra-axial, or extra-axial fluid collection. Vascular: No hyperdense vessel or unexpected calcification. Intracranial atherosclerosis. Skull:  Normal Sinuses/Orbits: Normal imaged portions of the orbits and globes. Mucosal thickening of ethmoid air cells. Other: None. IMPRESSION: 1.  No acute intracranial abnormality. 2. Small vessel ischemic change with remote right parietal infarct. 3. Sinus disease. Electronically Signed   By: Abigail Miyamoto M.D.   On: 05/04/2021 14:33   DG Chest Port 1 View  Result Date: 05/04/2021 CLINICAL DATA:  COVID positive, lethargy EXAM: PORTABLE CHEST 1 VIEW COMPARISON:  None. FINDINGS: The heart size and mediastinal contours are within normal limits. Minimal, subtle ground-glass opacity, most conspicuous in the peripheral right lung and right lung base. The visualized skeletal structures are unremarkable. IMPRESSION: Minimal, subtle ground-glass opacity, most conspicuous in the peripheral right lung and right lung base, generally consistent with reported history of COVID-19. Electronically Signed   By: Eddie Candle M.D.   On: 05/04/2021 12:02     Scheduled Meds:  aspirin  81 mg Oral Daily   clopidogrel  75 mg Oral Daily   enoxaparin (LOVENOX) injection  40 mg Subcutaneous Q24H   levothyroxine  50 mcg Oral Q0600   lisinopril  10 mg Oral Daily   LORazepam  1 mg Oral BID   mirtazapine  7.5 mg Oral QHS   PARoxetine  20 mg Oral Daily   rosuvastatin  5 mg Oral Daily   vitamin B-12  1,000 mcg Oral Daily   Continuous Infusions:  azithromycin Stopped (05/05/21 1426)   cefTRIAXone (ROCEPHIN)  IV 1 g (05/05/21 1053)     LOS: 1 day    Time spent: 30 minutes   Barton Dubois, MD [facility]   To contact the attending provider between 7A-7P or the covering provider during after hours 7P-7A, please log into the web site [email or web address] and access using universal [place] password for that web site. If you do not have the password, please call the hospital operator.  05/05/2021, 7:23 PM

## 2021-05-06 MED ORDER — SODIUM CHLORIDE 0.9 % IV SOLN: INTRAVENOUS | Status: DC

## 2021-05-06 NOTE — Progress Notes (Signed)
Patient is screaming out and very anxious.. When this nurse went into check on her she stated that she was wanting her anxiety medicine. As MD for PRN and he stated to give xanax BID medication early.

## 2021-05-06 NOTE — Progress Notes (Signed)
PROGRESS NOTE    Susan Davidson  MRN:[id number] DOB: 01/13/1951 DOA: 05/04/2021 PCP: Blass, Joel, MD    Chief Complaint  Patient presents with   Altered Mental Status    Brief admission Narrative:  Susan Davidson is a 70 y.o. 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  Patient was diagnosed COVID-positive on 04/21/2021.  Over the next couple days, the patient had increasing somnolence, confusion, and intermittent agitation.  Apparently, the patient was refusing to take some of her medications and she was being more argumentative.  This was unusual for the patient.  Because of her worsening mental status, the patient was transferred to emergency department for further evaluation.  The patient herself denies any fevers, chills, headache, chest pain, shortness breath, cough, hemoptysis, nausea, vomiting, direct abdominal pain. In the emergency department, the patient was afebrile hemodynamically stable with oxygen saturation 98-100% on room air.  BMP was unremarkable with sodium 137, potassium 4.6, serum creatinine 1.08.  AST 15, ALT 13, alk phosphatase 54, total bilirubin 0.6.  WBC 6.4, hemoglobin 14.2, platelets 230,000. UA>50 WBC.  CRP 0.5, lactic 1.2, PCT <0.10.  Chest x-ray showed groundglass opacities in the right lower lobe.  The patient was started on ceftriaxone.  Assessment & Plan: 1-acute metabolic encephalopathy -In the setting of UTI -Continue to maintain adequate hydration -Continue Constant reorientation and minimize the use of sedative agents. -Continue Rocephin -Continue to follow final culture results. -B12 level within normal limits; elevated TSH as mentioned below, continue adjusted dose of Synthroid.  2-COVID-19 infection -Patient denies shortness of breath currently. -Diagnosed with [place] about 1 week  prior to admission at her skilled nursing facility. -Given risk factors patient was treated with monoclonal antibody infusion. -CRP and procalcitonin stable and suggesting no bacterial superinfection. -Continue as needed bronchodilators and supportive care. -No requiring oxygen supplementation.  Good saturation on room air.  3-history of stroke -No new focal deficits -Continue secondary prevention with the use of aspirin, Plavix and statins.  4-history of anxiety/depression -Continue home Paxil and as needed alprazolam -Overall mood stable.  5-hypothyroidism -TSH above 20-26 on presentation -Continue Synthroid dose adjusted to 75 mcg daily. -Repeat thyroid panel in 6 weeks.  6-chronic kidney disease a stage IIIb -Baseline creatinine 1-1.3 -Stable and at baseline currently. -Continue to maintain adequate hydration. -Continue to follow renal function trend intermittently.   DVT prophylaxis: lovenox Code Status: Full Code. Family Communication: no family at bedside. Disposition:   Status is: Inpatient  Remains inpatient appropriate because:IV treatments appropriate due to intensity of illness or inability to take PO  Dispo: The patient is from: SNF              Anticipated d/c is to: SNF              Patient currently is not medically stable to d/c.  Patient reaching medical stability; hopefully in the next 24 to 48 hours to return back to skilled nursing facility.   Difficult to place patient No    Consultants:  None   Procedures:  See below for x-ray reports.   Antimicrobials:  Rocephin   Subjective: No fever, no chest pain, no nausea vomiting.  Improving her mentation appreciated.  Good oxygen saturation on room air.  Objective: Vitals:   05/05/21 1505 05/06/21 0001 05/06/21 0406 05/06/21 1356  BP: (!) 164/70 (!) 168/90 (!) 158/76 (!) 162/85    Pulse: 73 72 77 81  Resp: 14 19 19 18  Temp: 97.7 F (36.5 C) (!) 97.4 F (36.3 C) 98 F (36.7 C) 98.1 F (36.7  C)  TempSrc: Oral Oral Oral Oral  SpO2:  95% 96% 100%  Weight:      Height:        Intake/Output Summary (Last 24 hours) at 05/06/2021 1818 Last data filed at 05/06/2021 1300 Gross per 24 hour  Intake 1660 ml  Output 500 ml  Net 1160 ml   Filed Weights   05/04/21 1638  Weight: 60 kg    Examination: General exam: Alert, awake, oriented x 3 intermittently; following commands appropriately and not demonstrating hallucinations or acute distress currently.  Patient denies chest pain, shortness of breath, nausea or vomiting.  Respiratory system: Clear to auscultation. Respiratory effort normal.  No using accessory muscle.  Good saturation on room air. Cardiovascular system:RRR. No rubs or gallops; no JVD. Gastrointestinal system: Abdomen is nondistended, soft and nontender. No organomegaly or masses felt. Normal bowel sounds heard. Central nervous system: Alert and oriented. No focal neurological deficits. Extremities: No cyanosis or clubbing. Skin: No petechiae. Psychiatry: Judgement and insight appear improved today; still intermittently mildly confused.  No hallucinations.  Overall mood & affect more appropriate today.    Data Reviewed: I have personally reviewed following labs and imaging studies  CBC: Recent Labs  Lab 05/04/21 1111 05/05/21 0426  WBC 6.4 7.3  NEUTROABS 4.3  --   HGB 14.2 13.1  HCT 44.8 41.3  MCV 103.2* 103.5*  PLT 330 342    Basic Metabolic Panel: Recent Labs  Lab 05/04/21 1111 05/05/21 0426  NA 137 138  K 4.6 4.4  CL 103 105  CO2 26 25  GLUCOSE 130* 114*  BUN 26* 22  CREATININE 1.08* 0.99  CALCIUM 9.0 8.8*    GFR: Estimated Creatinine Clearance: 40.6 mL/min (by C-G formula based on SCr of 0.99 mg/dL).  Liver Function Tests: Recent Labs  Lab 05/04/21 1111 05/05/21 0426  AST 15 16  ALT 13 12  ALKPHOS 54 49  BILITOT 0.6 0.5  PROT 8.0 7.3  ALBUMIN 4.2 3.8    CBG: Recent Labs  Lab 05/04/21 1056  GLUCAP 118*    Recent Results  (from the past 240 hour(s))  Resp Panel by RT-PCR (Flu A&B, Covid) Nasopharyngeal Swab     Status: Abnormal   Collection Time: 05/04/21 11:11 AM   Specimen: Nasopharyngeal Swab; Nasopharyngeal(NP) swabs in vial transport medium  Result Value Ref Range Status   SARS Coronavirus 2 by RT PCR POSITIVE (A) NEGATIVE Final    Comment: CRITICAL RESULT CALLED TO, READ BACK BY AND VERIFIED WITH: LONG, J AT 1256 ON 8.2.22 BY RUCINSKI,B (NOTE) SARS-CoV-2 target nucleic acids are DETECTED.  The SARS-CoV-2 RNA is generally detectable in upper respiratory specimens during the acute phase of infection. Positive results are indicative of the presence of the identified virus, but do not rule out bacterial infection or co-infection with other pathogens not detected by the test. Clinical correlation with patient history and other diagnostic information is necessary to determine patient infection status. The expected result is Negative.  Fact Sheet for Patients: https://www.fda.gov/media/152166/download  Fact Sheet for Healthcare Providers: https://www.fda.gov/media/152162/download  This test is not yet approved or cleared by the [place] FDA and  has been authorized for detection and/or diagnosis of SARS-CoV-2 by FDA under an Emergency Use Authorization (EUA).  This EUA will remain in effect (meaning t his test can be used)   for the duration of  the COVID-19 declaration under Section 564(b)(1) of the Act, 21 U.S.C. section 360bbb-3(b)(1), unless the authorization is terminated or revoked sooner.     Influenza A by PCR NEGATIVE NEGATIVE Final   Influenza B by PCR NEGATIVE NEGATIVE Final    Comment: (NOTE) The Xpert Xpress SARS-CoV-2/FLU/RSV plus assay is intended as an aid in the diagnosis of influenza from Nasopharyngeal swab specimens and should not be used as a sole basis for treatment. Nasal washings and aspirates are unacceptable for Xpert Xpress SARS-CoV-2/FLU/RSV testing.  Fact Sheet  for Patients: https://www.fda.gov/media/152166/download  Fact Sheet for Healthcare Providers: https://www.fda.gov/media/152162/download  This test is not yet approved or cleared by the [place] FDA and has been authorized for detection and/or diagnosis of SARS-CoV-2 by FDA under an Emergency Use Authorization (EUA). This EUA will remain in effect (meaning this test can be used) for the duration of the COVID-19 declaration under Section 564(b)(1) of the Act, 21 U.S.C. section 360bbb-3(b)(1), unless the authorization is terminated or revoked.  Performed at [facility], [address] [place], [place] [address]   Blood Culture (routine x 2)     Status: None (Preliminary result)   Collection Time: 05/04/21 11:11 AM   Specimen: Right Antecubital; Blood  Result Value Ref Range Status   Specimen Description RIGHT ANTECUBITAL  Final   Special Requests   Final    BOTTLES DRAWN AEROBIC AND ANAEROBIC Blood Culture adequate volume   Culture   Final    NO GROWTH < 24 HOURS Performed at [facility], [address] [place], [place] [address]    Report Status PENDING  Incomplete  Blood Culture (routine x 2)     Status: None (Preliminary result)   Collection Time: 05/04/21 11:56 AM   Specimen: Left Antecubital; Blood  Result Value Ref Range Status   Specimen Description LEFT ANTECUBITAL  Final   Special Requests   Final    BOTTLES DRAWN AEROBIC AND ANAEROBIC Blood Culture adequate volume   Culture   Final    NO GROWTH < 24 HOURS Performed at [facility], [address] [place], [place] [address]    Report Status PENDING  Incomplete     Radiology Studies: No results found.   Scheduled Meds:  aspirin  81 mg Oral Daily   clopidogrel  75 mg Oral Daily   enoxaparin (LOVENOX) injection  40 mg Subcutaneous Q24H   levothyroxine  75 mcg Oral Q0600   lisinopril  10 mg Oral Daily   LORazepam  1 mg Oral BID   mirtazapine  7.5 mg Oral QHS   PARoxetine  20 mg Oral Daily   rosuvastatin   5 mg Oral Daily   vitamin B-12  1,000 mcg Oral Daily   Continuous Infusions:  cefTRIAXone (ROCEPHIN)  IV 1 g (05/06/21 1235)     LOS: 2 days    Time spent: 30 minutes    , MD [facility]   To contact the attending provider between 7A-7P or the covering provider during after hours 7P-7A, please log into the web site [email or web address] and access using universal [place] password for that web site. If you do not have the password, please call the hospital operator.  05/06/2021, 6:18 PM    

## 2021-05-07 DIAGNOSIS — E782 Mixed hyperlipidemia: Secondary | ICD-10-CM

## 2021-05-07 LAB — BASIC METABOLIC PANEL
Anion gap: 7 (ref 5–15)
BUN: 13 mg/dL (ref 8–23)
CO2: 26 mmol/L (ref 22–32)
Calcium: 8.7 mg/dL — ABNORMAL LOW (ref 8.9–10.3)
Chloride: 108 mmol/L (ref 98–111)
Creatinine, Ser: 0.97 mg/dL (ref 0.44–1.00)
GFR, Estimated: >60 mL/min (ref >60)
Glucose, Bld: 97 mg/dL (ref 70–99)
Potassium: 4 mmol/L (ref 3.5–5.1)
Sodium: 141 mmol/L (ref 135–145)

## 2021-05-07 LAB — CBC
HCT: 39.7 % (ref 36.0–46.0)
Hemoglobin: 12.7 g/dL (ref 12.0–15.0)
MCH: 32.9 pg (ref 26.0–34.0)
MCHC: 32 g/dL (ref 30.0–36.0)
MCV: 102.8 fL — ABNORMAL HIGH (ref 80.0–100.0)
Platelets: 357 10*3/uL (ref 150–400)
RBC: 3.86 MIL/uL — ABNORMAL LOW (ref 3.87–5.11)
RDW: 13.7 % (ref 11.5–15.5)
WBC: 5.4 10*3/uL (ref 4.0–10.5)
nRBC: 0 % (ref 0.0–0.2)

## 2021-05-07 MED ORDER — LORAZEPAM 1 MG PO TABS: 1.0000 mg | ORAL_TABLET | Freq: Two times a day (BID) | ORAL | 0 refills | Status: AC

## 2021-05-07 MED ORDER — CEFDINIR 300 MG PO CAPS: 300.0000 mg | ORAL_CAPSULE | Freq: Two times a day (BID) | ORAL | 0 refills | Status: AC

## 2021-05-07 MED ORDER — LEVOTHYROXINE SODIUM 75 MCG PO TABS: 75.0000 ug | ORAL_TABLET | Freq: Every day | ORAL | 2 refills | Status: DC

## 2021-05-07 NOTE — Discharge Summary (Signed)
Physician Discharge Summary  Susan Davidson WNI:[id number] DOB: 30-May-1951 DOA: 05/04/2021  PCP: Caprice Renshaw, MD  Admit date: 05/04/2021 Discharge date: 05/07/2021  Time spent: 35 minutes  Recommendations for Outpatient Follow-up:  Repeat thyroid panel in 4-6 weeks. Repeat basic metabolic panel in 1 week to follow electrolytes and renal function    Discharge Diagnoses:  Active Problems:   HLD (hyperlipidemia)   Essential hypertension   Acute metabolic encephalopathy   CKD (chronic kidney disease) stage 3, GFR 30-59 ml/min (HCC)   COVID-19 virus infection   Discharge Condition: Stable and improved.  Discharged back to skilled nursing home with instruction to follow-up with PCP in 10 days.  CODE STATUS: Full code.  Diet recommendation: Heart healthy diet.  Filed Weights   05/04/21 1638  Weight: 60 kg    History of present illness:  Susan Davidson is a 70 y.o. 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  Patient was diagnosed COVID-positive on 04/21/2021.  Over the next couple days, the patient had increasing somnolence, confusion, and intermittent agitation.  Apparently, the patient was refusing to take some of her medications and she was being more argumentative.  This was unusual for the patient.  Because of her worsening mental status, the patient was transferred to emergency department for further evaluation.  The patient herself denies any fevers, chills, headache, chest pain, shortness breath, cough, hemoptysis, nausea, vomiting, direct abdominal pain. In the emergency department, the patient was afebrile hemodynamically stable with oxygen saturation 98-100% on room air.  BMP was unremarkable with sodium 137, potassium 4.6, serum creatinine 1.08.  AST 15, ALT 13, alk phosphatase 54, total bilirubin 0.6.  WBC 6.4,  hemoglobin 14.2, platelets 230,000. UA>50 WBC.  CRP 0.5, lactic 1.2, PCT <0.10.  Chest x-ray showed groundglass opacities in the right lower lobe.  The patient was started on ceftriaxone.  Hospital Course:  1-acute metabolic encephalopathy -In the setting of UTI -Continue Constant reorientation and minimize the use of sedative agents. -Completed treatment for UTI using cefdinir orally. -Maintain adequate hydration. -B12 level within normal limits; elevated TSH as mentioned below, continue adjusted dose of Synthroid.   2-COVID-19 infection -Patient denies shortness of breath currently. -Diagnosed with COVID about 1 week prior to admission at her skilled nursing facility. -Given risk factors patient was treated with monoclonal antibody infusion. -CRP and procalcitonin stable and suggesting no pulmonary bacterial superinfection. -Continue as needed bronchodilators and supportive care. -No requiring oxygen supplementation.  Good saturation on room air. -Continue to practice the 3W's and supportive care.   3-history of stroke -No new focal deficits -Continue secondary prevention with the use of aspirin, Plavix and statins. -Continue rehabilitation and risk factors modifications.   4-history of anxiety/depression -Continue home Paxil and as needed lorazepam. -Overall mood is a stable; intermittent episode of anxiety while hospitalized described by nursing staff.   5-hypothyroidism -TSH above 20-26 on presentation -Continue Synthroid, dose adjusted to 75 mcg daily. -Repeat thyroid panel in 6 weeks.   6-chronic kidney disease a stage IIIb -Baseline creatinine 1-1.3 -Stable and at baseline currently. -Continue to maintain adequate hydration. -Repeat basic metabolic panel in 1 week to assess renal function stability and trend.  Procedures: See below for x-ray reports.  Consultations: None  Discharge Exam: Vitals:   05/06/21 2307 05/07/21 0532  BP: (!) 151/81 (!) 170/94  Pulse:  86 84  Resp: 18 20  Temp: 98.1 F (36.7 C) 98.3 F (36.8 C)  SpO2: 94% (!) 84%   General exam: Alert, awake, oriented x 3; following commands appropriately and not demonstrating hallucinations or acute distress currently.  Patient denies chest pain, shortness of breath, nausea or vomiting.  Intermittent episodes of anxiety described by nursing staff, but will control with the use of anxiolytic prescriptions provided prior to admission. Respiratory system: Clear to auscultation. Respiratory effort normal.  No using accessory muscle.  Good saturation on room air. Cardiovascular system:RRR. No rubs or gallops; no JVD. Gastrointestinal system: Abdomen is nondistended, soft and nontender. No organomegaly or masses felt. Normal bowel sounds heard. Central nervous system: Alert and oriented. No focal neurological deficits. Extremities: No cyanosis or clubbing. Skin: No petechiae. Psychiatry: Judgement and insight appear improved and stable.  Overall with a stable mood during today's evaluation; per nursing staff intermittent episodes of anxiety appreciated.   Discharge Instructions    Allergies as of 05/07/2021       Reactions   Other Anaphylaxis   Fire ant venom   Epinephrine    Influenza Vac Split Quad    fatigue        Medication List     STOP taking these medications    lovastatin 20 MG tablet Commonly known as: MEVACOR       TAKE these medications    amphetamine-dextroamphetamine 20 MG tablet Commonly known as: ADDERALL Take 1 tablet (20 mg total) by mouth 2 (two) times daily.   aspirin 81 MG EC tablet Take 1 tablet (81 mg total) by mouth daily. Swallow whole.   cefdinir 300 MG capsule Commonly known as: OMNICEF Take 1 capsule (300 mg total) by mouth 2 (two) times daily for 5 days.   clopidogrel 75 MG tablet Commonly known as: PLAVIX Take 1 tablet (75 mg total) by mouth daily.   levothyroxine 75 MCG tablet Commonly known as: SYNTHROID Take 1 tablet (75 mcg  total) by mouth daily at 6 (six) AM. Start taking on: May 08, 2021 What changed:  medication strength how much to take how to take this when to take this additional instructions   lisinopril 10 MG tablet Commonly known as: ZESTRIL Take 1 tablet (10 mg total) by mouth daily.   LORazepam 1 MG tablet Commonly known as: Ativan Take 1 tablet (1 mg total) by mouth 2 (two) times daily for 5 days.   mirtazapine 7.5 MG tablet Commonly known as: REMERON Take 7.5 mg by mouth at bedtime.   multivitamin tablet Take 1 tablet by mouth daily.   PARoxetine 20 MG tablet Commonly known as: PAXIL Take 20 mg by mouth daily. What changed: Another medication with the same name was removed. Continue taking this medication, and follow the directions you see here.   risperiDONE 1 MG tablet Commonly known as: RISPERDAL Take 1 mg by mouth 2 (two) times daily.   rosuvastatin 5 MG tablet Commonly known as: CRESTOR Take 5 mg by mouth daily.   vitamin B-12 1000 MCG tablet Commonly known as: CYANOCOBALAMIN Take 1 tablet (1,000 mcg total) by mouth daily.   Vitamin D3 50 MCG (2000 UT) Tabs Take 1 tablet by mouth daily.               Discharge Care Instructions  (From admission, onward)           Start     Ordered   05/07/21 0000  Discharge wound care:       Comments: Keep pressure off left  heel to prevent break down of skin and further progression of deep tissue injury; the use of prevalon boots or extra pillows to off load pressure in her feet will help with prevention.   05/07/21 1139           Allergies  Allergen Reactions   Other Anaphylaxis    Fire ant venom   Epinephrine    Influenza Vac Split Quad     fatigue    Follow-up Information     Caprice Renshaw, MD. Schedule an appointment as soon as possible for a visit in 10 day(s).   Specialty: Internal Medicine Contact information: [place]  [phone]                  The  results of significant diagnostics from this hospitalization (including imaging, microbiology, ancillary and laboratory) are listed below for reference.    Significant Diagnostic Studies: CT HEAD WO CONTRAST  Result Date: 05/04/2021 CLINICAL DATA:  Mental status changes.  Known COVID positive. EXAM: CT HEAD WITHOUT CONTRAST TECHNIQUE: Contiguous axial images were obtained from the base of the skull through the vertex without intravenous contrast. COMPARISON:  03/05/2021 FINDINGS: Brain: Mild low density in the periventricular white matter likely related to small vessel disease. Remote right parietal vertex infarct again identified. No mass lesion, hemorrhage, hydrocephalus, acute infarct, intra-axial, or extra-axial fluid collection. Vascular: No hyperdense vessel or unexpected calcification. Intracranial atherosclerosis. Skull: Normal Sinuses/Orbits: Normal imaged portions of the orbits and globes. Mucosal thickening of ethmoid air cells. Other: None. IMPRESSION: 1.  No acute intracranial abnormality. 2. Small vessel ischemic change with remote right parietal infarct. 3. Sinus disease. Electronically Signed   By: Abigail Miyamoto M.D.   On: 05/04/2021 14:33   DG Chest Port 1 View  Result Date: 05/04/2021 CLINICAL DATA:  COVID positive, lethargy EXAM: PORTABLE CHEST 1 VIEW COMPARISON:  None. FINDINGS: The heart size and mediastinal contours are within normal limits. Minimal, subtle ground-glass opacity, most conspicuous in the peripheral right lung and right lung base. The visualized skeletal structures are unremarkable. IMPRESSION: Minimal, subtle ground-glass opacity, most conspicuous in the peripheral right lung and right lung base, generally consistent with reported history of COVID-19. Electronically Signed   By: Eddie Candle M.D.   On: 05/04/2021 12:02    Microbiology: Recent Results (from the past 240 hour(s))  Resp Panel by RT-PCR (Flu A&B, Covid) Nasopharyngeal Swab     Status: Abnormal   Collection  Time: 05/04/21 11:11 AM   Specimen: Nasopharyngeal Swab; Nasopharyngeal(NP) swabs in vial transport medium  Result Value Ref Range Status   SARS Coronavirus 2 by RT PCR POSITIVE (A) NEGATIVE Final    Comment: CRITICAL RESULT CALLED TO, READ BACK BY AND VERIFIED WITH: LONG, J AT 1256 ON 8.2.22 BY RUCINSKI,B (NOTE) SARS-CoV-2 target nucleic acids are DETECTED.  The SARS-CoV-2 RNA is generally detectable in upper respiratory specimens during the acute phase of infection. Positive results are indicative of the presence of the identified virus, but do not rule out bacterial infection or co-infection with other pathogens not detected by the test. Clinical correlation with patient history and other diagnostic information is necessary to determine patient infection status. The expected result is Negative.  Fact Sheet for Patients: EntrepreneurPulse.com.au  Fact Sheet for Healthcare Providers: IncredibleEmployment.be  This test is not yet approved or cleared by the [place] FDA and  has been authorized for detection and/or diagnosis of SARS-CoV-2 by FDA under an Emergency Use Authorization (EUA).  This  EUA will remain in effect (meaning t his test can be used) for the duration of  the COVID-19 declaration under Section 564(b)(1) of the Act, 21 U.S.C. section 360bbb-3(b)(1), unless the authorization is terminated or revoked sooner.     Influenza A by PCR NEGATIVE NEGATIVE Final   Influenza B by PCR NEGATIVE NEGATIVE Final    Comment: (NOTE) The Xpert Xpress SARS-CoV-2/FLU/RSV plus assay is intended as an aid in the diagnosis of influenza from Nasopharyngeal swab specimens and should not be used as a sole basis for treatment. Nasal washings and aspirates are unacceptable for Xpert Xpress SARS-CoV-2/FLU/RSV testing.  Fact Sheet for Patients: EntrepreneurPulse.com.au  Fact Sheet for Healthcare  Providers: IncredibleEmployment.be  This test is not yet approved or cleared by the [place] FDA and has been authorized for detection and/or diagnosis of SARS-CoV-2 by FDA under an Emergency Use Authorization (EUA). This EUA will remain in effect (meaning this test can be used) for the duration of the COVID-19 declaration under Section 564(b)(1) of the Act, 21 U.S.C. section 360bbb-3(b)(1), unless the authorization is terminated or revoked.  Performed at [facility], [address], [place], [place] [address]   Blood Culture (routine x 2)     Status: None (Preliminary result)   Collection Time: 05/04/21 11:11 AM   Specimen: Right Antecubital; Blood  Result Value Ref Range Status   Specimen Description RIGHT ANTECUBITAL  Final   Special Requests   Final    BOTTLES DRAWN AEROBIC AND ANAEROBIC Blood Culture adequate volume   Culture   Final    NO GROWTH < 24 HOURS Performed at [facility], [address] [place], [place] [address]    Report Status PENDING  Incomplete  Blood Culture (routine x 2)     Status: None (Preliminary result)   Collection Time: 05/04/21 11:56 AM   Specimen: Left Antecubital; Blood  Result Value Ref Range Status   Specimen Description LEFT ANTECUBITAL  Final   Special Requests   Final    BOTTLES DRAWN AEROBIC AND ANAEROBIC Blood Culture adequate volume   Culture   Final    NO GROWTH < 24 HOURS Performed at [facility], [address], [place], [place] [address]    Report Status PENDING  Incomplete     Labs: Basic Metabolic Panel: Recent Labs  Lab 05/04/21 1111 05/05/21 0426 05/07/21 0530  NA 137 138 141  K 4.6 4.4 4.0  CL 103 105 108  CO2 _0 GLUCOSE 130* 114* 97  BUN 26* 22 13  CREATININE 1.08* 0.99 0.97  CALCIUM 9.0 8.8* 8.7*   Liver Function Tests: Recent Labs  Lab 05/04/21 1111 05/05/21 0426  AST 15 16  ALT 13 12  ALKPHOS 54 49  BILITOT 0.6 0.5  PROT 8.0 7.3  ALBUMIN 4.2 3.8   No results for  input(s): LIPASE, AMYLASE in the last 168 hours. No results for input(s): AMMONIA in the last 168 hours. CBC: Recent Labs  Lab 05/04/21 1111 05/05/21 0426 05/07/21 0530  WBC 6.4 7.3 5.4  NEUTROABS 4.3  --   --   HGB 14.2 13.1 12.7  HCT 44.8 41.3 39.7  MCV 103.2* 103.5* 102.8*  PLT 330 342 357   Cardiac Enzymes: No results for input(s): CKTOTAL, CKMB, CKMBINDEX, TROPONINI in the last 168 hours. BNP: BNP (last 3 results) No results for input(s): BNP in the last 8760 hours.  ProBNP (last 3 results) No results for input(s): PROBNP in the last 8760 hours.  CBG: Recent  Labs  Lab 05/04/21 1056  GLUCAP 118*       Signed:  Barton Dubois MD.  [facility] 05/07/2021, 9:19 AM

## 2021-05-07 NOTE — NC FL2 (Signed)
[place] LEVEL OF CARE SCREENING TOOL     IDENTIFICATION  Patient Name: Susan Davidson Birthdate: May 23, 1951 Sex: female Admission Date (Current Location): 05/04/2021  [facility] and [place] Number:  [employer] and Address:  [place] [address], [place]      Provider Number: [id number]  Attending Physician Name and Address:  Barton Dubois, MD  Relative Name and Phone Number:  [name], [name] (Spouse)   [phone]    Current Level of Care: Hospital Recommended Level of Care: [place] Prior Approval Number:    Date Approved/Denied:   PASRR Number:    Discharge Plan: SNF    Current Diagnoses: Patient Active Problem List   Diagnosis Date Noted   Acute metabolic encephalopathy [id number]   CKD (chronic kidney disease) stage 3, GFR 30-59 ml/min ([place]) 05/04/2021   COVID-19 virus infection 05/04/2021   Lower urinary tract infectious disease    Generalized weakness 01/27/2021   Spinal stenosis at L4-L5 level 01/27/2021   CVA (cerebral vascular accident) ([place]) 01/27/2021   Ambulatory dysfunction 01/26/2021   Tobacco use disorder 01/26/2021   CKD (chronic kidney disease) 11/15/2020   B12 deficiency 11/09/2020   Urinary frequency 11/02/2018   Daytime somnolence 10/19/2012   Impaired glucose tolerance 08/27/2011   Preventative health care 08/27/2011   Vitamin D deficiency 04/14/2010   HYPERSOMNIA 02/19/2009   Hypothyroidism 02/12/2008   HLD (hyperlipidemia) 02/12/2008   ANXIETY 02/12/2008   Depression 02/12/2008   Essential hypertension 02/12/2008   OSTEOPENIA 02/12/2008    Orientation RESPIRATION BLADDER Height & Weight     Self, Time, Situation, Place  Normal Continent Weight: 132 lb 4.4 oz (60 kg) Height:  '5\' 2"'$  (157.5 cm)  BEHAVIORAL SYMPTOMS/MOOD NEUROLOGICAL BOWEL NUTRITION STATUS      Continent Diet (heart healthy)  AMBULATORY STATUS COMMUNICATION OF NEEDS Skin   Extensive Assist   Normal                        Personal Care Assistance Level of Assistance  Bathing, Feeding, Dressing Bathing Assistance: Maximum assistance Feeding assistance: Independent Dressing Assistance: Maximum assistance     Functional Limitations Info  Sight, Hearing, Speech Sight Info: Adequate Hearing Info: Adequate Speech Info: Adequate    SPECIAL CARE FACTORS FREQUENCY              Restorative Feeding Program Frequency: n/a        Contractures Contractures Info: Not present    Additional Factors Info  Code Status, Allergies, Isolation Precautions Code Status Info: Full Allergies Info: Fire ant venom, epinephrine, influenza vac split quad     Isolation Precautions Info: COVID+ 05/03/21 per facility     Current Medications (05/07/2021):  This is the current hospital active medication list Current Facility-Administered Medications  Medication Dose Route Frequency Provider Last Rate Last Admin   0.9 %  sodium chloride infusion   Intravenous Continuous Barton Dubois, MD 75 mL/hr at 05/06/21 2026 New Bag at 05/06/21 2026   acetaminophen (TYLENOL) tablet 650 mg  650 mg Oral Q6H PRN Orson Eva, MD   650 mg at 05/04/21 [id number]   Or   acetaminophen (TYLENOL) suppository 650 mg  650 mg Rectal Q6H PRN Tat, Shanon Brow, MD       aspirin chewable tablet 81 mg  81 mg Oral Daily Tat, David, MD   81 mg at 05/07/21 1004   cefTRIAXone (ROCEPHIN) 1 g in sodium chloride 0.9 % 100 mL  IVPB  1 g Intravenous Q24H Tat, Shanon Brow, MD 200 mL/hr at 05/06/21 1235 1 g at 05/06/21 1235   clopidogrel (PLAVIX) tablet 75 mg  75 mg Oral Daily Tat, David, MD   75 mg at 05/07/21 1004   enoxaparin (LOVENOX) injection 40 mg  40 mg Subcutaneous Q24H Tat, Shanon Brow, MD   40 mg at 05/06/21 1700   levothyroxine (SYNTHROID) tablet 75 mcg  75 mcg Oral Q0600 Barton Dubois, MD   75 mcg at 05/07/21 0529   lisinopril (ZESTRIL) tablet 10 mg  10 mg Oral Daily Tat, David, MD   10 mg at 05/07/21 1004   LORazepam (ATIVAN) tablet 1 mg  1 mg Oral  BID Tat, David, MD   1 mg at 05/07/21 1004   mirtazapine (REMERON) tablet 7.5 mg  7.5 mg Oral QHS Orson Eva, MD   7.5 mg at 05/06/21 2027   ondansetron (ZOFRAN) tablet 4 mg  4 mg Oral Q6H PRN Tat, Shanon Brow, MD       Or   ondansetron [facility]) injection 4 mg  4 mg Intravenous Q6H PRN Tat, Shanon Brow, MD       PARoxetine (PAXIL) tablet 20 mg  20 mg Oral Daily Tat, David, MD   20 mg at 05/07/21 1004   rosuvastatin (CRESTOR) tablet 5 mg  5 mg Oral Daily Tat, David, MD   5 mg at 05/07/21 1004   vitamin B-12 (CYANOCOBALAMIN) tablet 1,000 mcg  1,000 mcg Oral Daily Tat, Shanon Brow, MD   1,000 mcg at 05/07/21 1004     Discharge Medications: Please see discharge summary for a list of discharge medications.  Relevant Imaging Results:  Relevant Lab Results:   Additional Information SSN: [id number] Clydene Pugh, LCSW

## 2021-05-07 NOTE — Care Management Important Message (Signed)
Important Message  Patient Details  Name: Susan Davidson MRN: [id number] Date of Birth: Jul 19, 1951   Medicare Important Message Given:  Yes - Important Message mailed due to current National Emergency     Tommy Medal 05/07/2021, 1:58 PM

## 2021-05-09 LAB — CULTURE, BLOOD (ROUTINE X 2)
Culture: NO GROWTH
Culture: NO GROWTH
Special Requests: ADEQUATE
Special Requests: ADEQUATE

## 2021-05-11 DIAGNOSIS — G9341 Metabolic encephalopathy: Secondary | ICD-10-CM | POA: Diagnosis not present

## 2021-05-11 DIAGNOSIS — J189 Pneumonia, unspecified organism: Secondary | ICD-10-CM | POA: Diagnosis not present

## 2021-05-18 DIAGNOSIS — D649 Anemia, unspecified: Secondary | ICD-10-CM | POA: Diagnosis not present

## 2021-05-18 DIAGNOSIS — E039 Hypothyroidism, unspecified: Secondary | ICD-10-CM | POA: Diagnosis not present

## 2021-05-18 DIAGNOSIS — N184 Chronic kidney disease, stage 4 (severe): Secondary | ICD-10-CM | POA: Diagnosis not present

## 2021-05-19 ENCOUNTER — Telehealth: Payer: Self-pay

## 2021-05-19 NOTE — Telephone Encounter (Signed)
FYI-This patient needs a new patient (hospital fu). It will be for her thyroid. [facility] faxed [place] to make an appt. I have called them 2x and unable to reach anyone.

## 2021-05-27 ENCOUNTER — Encounter: Payer: Self-pay | Admitting: Nurse Practitioner

## 2021-05-27 ENCOUNTER — Ambulatory Visit (INDEPENDENT_AMBULATORY_CARE_PROVIDER_SITE_OTHER): Payer: Medicare HMO | Admitting: Nurse Practitioner

## 2021-05-27 ENCOUNTER — Other Ambulatory Visit: Payer: Self-pay

## 2021-05-27 VITALS — BP 94/58 | HR 109 | Ht 62.0 in | Wt 133.0 lb

## 2021-05-27 DIAGNOSIS — E890 Postprocedural hypothyroidism: Secondary | ICD-10-CM

## 2021-05-27 DIAGNOSIS — E039 Hypothyroidism, unspecified: Secondary | ICD-10-CM | POA: Diagnosis not present

## 2021-05-27 MED ORDER — LEVOTHYROXINE SODIUM 88 MCG PO TABS: 88.0000 ug | ORAL_TABLET | Freq: Every day | ORAL | 1 refills | Status: DC

## 2021-05-27 NOTE — Patient Instructions (Signed)

## 2021-05-27 NOTE — Progress Notes (Signed)
Endocrinology Consult Note                                         05/27/2021, 3:30 PM  Subjective:   Subjective    Susan Davidson is a 70 y.o.-year-old female patient being seen in consultation for hypothyroidism referred by Caprice Renshaw, MD.   Past Medical History:  Diagnosis Date   ANXIETY 02/12/2008   Qualifier: Diagnosis of  By: Jenny Reichmann MD, Hunt Oris    DEPRESSION 02/12/2008   Qualifier: Diagnosis of  By: Elveria Royals    HYPERLIPIDEMIA 02/12/2008   Qualifier: Diagnosis of  By: Elveria Royals    HYPERTENSION 02/12/2008   Qualifier: Diagnosis of  By: Elveria Royals    HYPOTHYROIDISM 02/12/2008   Qualifier: Diagnosis of  By: Elveria Royals    Impaired glucose tolerance 08/27/2011   OSTEOPENIA 02/12/2008   Qualifier: Diagnosis of  By: Jenny Reichmann MD, Hunt Oris    VITAMIN D DEFICIENCY 04/14/2010   Qualifier: Diagnosis of  By: Jenny Reichmann MD, Hunt Oris     History reviewed. No pertinent surgical history.  Social History   Socioeconomic History   Marital status: Married    Spouse name: Not on file   Number of children: Not on file   Years of education: Not on file   Highest education level: Not on file  Occupational History   Not on file  Tobacco Use   Smoking status: Every Day   Smokeless tobacco: Never  Vaping Use   Vaping Use: Never used  Substance and Sexual Activity   Alcohol use: Yes   Drug use: No   Sexual activity: Not on file  Other Topics Concern   Not on file  Social History Narrative   Not on file   Social Determinants of Health   Financial Resource Strain: Not on file  Food Insecurity: Not on file  Transportation Needs: Not on file  Physical Activity: Not on file  Stress: Not on file  Social Connections: Not on file    Family History  Problem Relation Age of Onset   Heart disease Father    Bipolar disorder Sister    Diabetes Neg Hx     Outpatient Encounter  Medications as of 05/27/2021  Medication Sig   amphetamine-dextroamphetamine (ADDERALL) 20 MG tablet Take 1 tablet (20 mg total) by mouth 2 (two) times daily.   Cholecalciferol (VITAMIN D3) 50 MCG (2000 UT) TABS Take 1 tablet by mouth daily.   clopidogrel (PLAVIX) 75 MG tablet Take 1 tablet (75 mg total) by mouth daily.   levothyroxine (SYNTHROID) 88 MCG tablet Take 1 tablet (88 mcg total) by mouth daily.   lisinopril (ZESTRIL) 10 MG tablet Take 1 tablet (10 mg total) by mouth daily.   LORazepam (ATIVAN) 1 MG tablet Take 1 mg by mouth 2 (two) times daily.   melatonin 5 MG TABS Take 5 mg by mouth.   mirtazapine (REMERON) 7.5 MG tablet Take 7.5 mg by mouth at bedtime.  Multiple Vitamin (MULTIVITAMIN) tablet Take 1 tablet by mouth daily.   PARoxetine (PAXIL) 20 MG tablet Take 20 mg by mouth daily.   risperiDONE (RISPERDAL) 1 MG tablet Take 1 mg by mouth 2 (two) times daily.   rosuvastatin (CRESTOR) 5 MG tablet Take 5 mg by mouth daily.   vitamin B-12 (CYANOCOBALAMIN) 1000 MCG tablet Take 1 tablet (1,000 mcg total) by mouth daily.   [DISCONTINUED] levothyroxine (SYNTHROID) 112 MCG tablet Take 112 mcg by mouth daily before breakfast.   [DISCONTINUED] levothyroxine (SYNTHROID) 75 MCG tablet Take 1 tablet (75 mcg total) by mouth daily at 6 (six) AM.   [DISCONTINUED] aspirin EC 81 MG EC tablet Take 1 tablet (81 mg total) by mouth daily. Swallow whole.   No facility-administered encounter medications on file as of 05/27/2021.    ALLERGIES: Allergies  Allergen Reactions   Other Anaphylaxis    Fire ant venom   Epinephrine    Influenza Vac Split Quad     fatigue   VACCINATION STATUS: Immunization History  Administered Date(s) Administered   Moderna Sars-Covid-2 Vaccination 12/02/2019   Tdap 10/19/2012     HPI   Susan Davidson  is a patient who resides in a nursing home with the above medical history. she was diagnosed with hyperthyroidism at approximate age of 25 years, which required  RAI ablation and subsequent initiation of thyroid hormone replacement. she was given various doses of Levothyroxine over the years, currently on 112 mcg(recently increased by GP at nursing home). she reports compliance to this medication:  but takes it with other medications including vitamins.  I reviewed patient's thyroid tests:  Lab Results  Component Value Date   TSH 20.460 (H) 05/05/2021   TSH 26.064 (H) 05/04/2021   TSH 26.362 (H) 01/27/2021   TSH 39.15 (H) 11/09/2020   TSH 34.78 (H) 03/11/2020   TSH 30.78 (H) 11/02/2018   TSH 0.08 (L) 05/11/2017   TSH 0.34 (L) 04/14/2016   TSH 0.17 (L) 01/22/2015   TSH 0.59 12/27/2013   FREET4 0.66 01/28/2021   FREET4 0.38 (L) 11/09/2020     Pt describes: - fatigue - cold intolerance - depression - constipation - dry skin - hair loss  Pt denies feeling nodules in neck, hoarseness, dysphagia/odynophagia, SOB with lying down.  she denies family history of thyroid disorders.  No family history of thyroid cancer.   No recent use of iodine supplements.  Denies use of Biotin containing supplements.  She has never had any previous imaging of her thyroid in the past.  I reviewed her chart and she also has a history of CVA, encephalopathy, CKD, depression, HLD, Osteopenia, vitamin D deficiency.   ROS:  Constitutional: no weight gain/loss, + fatigue, no subjective hyperthermia, + subjective hypothermia Eyes: no blurry vision, no xerophthalmia ENT: no sore throat, no nodules palpated in throat, no dysphagia/odynophagia, no hoarseness Cardiovascular: no chest pain, no SOB, no palpitations, no leg swelling Respiratory: no cough, no SOB Gastrointestinal: no nausea/vomiting/diarrhea Musculoskeletal: no muscle/joint aches, in WC related to recent stroke affecting left side Skin: no rashes Neurological: no tremors, no numbness, no tingling, no dizziness Psychiatric: no depression, no anxiety   Objective:   Objective     BP (!) 94/58    Pulse (!) 109   Ht '5\' 2"'$  (1.575 m)   Wt 133 lb (60.3 kg)   BMI 24.33 kg/m  Wt Readings from Last 3 Encounters:  05/27/21 133 lb (60.3 kg)  05/04/21 132 lb 4.4 oz (60 kg)  04/07/21  133 lb (60.3 kg)    BP Readings from Last 3 Encounters:  05/27/21 (!) 94/58  05/07/21 (!) 170/94  04/07/21 139/81     Constitutional:  Body mass index is 24.33 kg/m., not in acute distress, normal state of mind Eyes: PERRLA, EOMI, no exophthalmos ENT: moist mucous membranes, no thyromegaly, no cervical lymphadenopathy Cardiovascular: normal precordial activity, irregular rhythm, no murmur/rubs/gallops Respiratory:  adequate breathing efforts, no gross chest deformity, rhonchi bilaterally Gastrointestinal: abdomen soft, non-tender, no distension, bowel sounds present Musculoskeletal: no gross deformities, in WC due to recent stroke Skin: moist, warm, no rashes Neurological: no tremor with outstretched hands, deep tendon reflexes normal in BLE.   CMP ( most recent) CMP     Component Value Date/Time   NA 141 05/07/2021 0530   K 4.0 05/07/2021 0530   CL 108 05/07/2021 0530   CO2 26 05/07/2021 0530   GLUCOSE 97 05/07/2021 0530   BUN 13 05/07/2021 0530   CREATININE 0.97 05/07/2021 0530   CALCIUM 8.7 (L) 05/07/2021 0530   PROT 7.3 05/05/2021 0426   ALBUMIN 3.8 05/05/2021 0426   AST 16 05/05/2021 0426   ALT 12 05/05/2021 0426   ALKPHOS 49 05/05/2021 0426   BILITOT 0.5 05/05/2021 0426   GFRNONAA >60 05/07/2021 0530   GFRAA 73 02/12/2008 1525     Diabetic Labs (most recent): Lab Results  Component Value Date   HGBA1C 6.4 (H) 01/27/2021   HGBA1C 6.6 (H) 11/09/2020   HGBA1C 6.3 11/02/2018     Lipid Panel ( most recent) Lipid Panel     Component Value Date/Time   CHOL 184 01/27/2021 0555   TRIG 172 (H) 05/04/2021 1111   HDL 56 01/27/2021 0555   CHOLHDL 3.3 01/27/2021 0555   VLDL 24 01/27/2021 0555   LDLCALC 104 (H) 01/27/2021 0555   LDLDIRECT 137.5 10/19/2012 1106       Lab  Results  Component Value Date   TSH 20.460 (H) 05/05/2021   TSH 26.064 (H) 05/04/2021   TSH 26.362 (H) 01/27/2021   TSH 39.15 (H) 11/09/2020   TSH 34.78 (H) 03/11/2020   TSH 30.78 (H) 11/02/2018   TSH 0.08 (L) 05/11/2017   TSH 0.34 (L) 04/14/2016   TSH 0.17 (L) 01/22/2015   TSH 0.59 12/27/2013   FREET4 0.66 01/28/2021   FREET4 0.38 (L) 11/09/2020      Assessment & Plan:   ASSESSMENT / PLAN:  1. Hypothyroidism-s/p RAI ablation for hyperthyroidism many years ago   Patient with long-standing hypothyroidism, on Levothyroxine therapy. On physical exam, patient  does not have gross goiter, thyroid nodules, or neck compression symptoms.  She is on a much higher dose of thyroid hormone according to her weight based max dose of 96 mcg.    I advised her to lower her dose of Levothyroxine back to a safe dose of 88 mcg po daily before breakfast and wrote specific directions on how her medication should be administered to the nursing home in which she resides.  - We discussed about correct intake of levothyroxine, at fasting, with water, separated by at least 30 minutes from breakfast, and separated by more than 4 hours from calcium, iron, multivitamins, acid reflux medications (PPIs). -Patient is made aware of the fact that thyroid hormone replacement is needed for life, dose to be adjusted by periodic monitoring of thyroid function tests.  - Will check thyroid tests before next visit: TSH, free T4.    -Due to absence of clinical goiter, no need for thyroid ultrasound.    -  Time spent with the patient: 45 minutes, of which >50% was spent in obtaining information about her symptoms, reviewing her previous labs, evaluations, and treatments, counseling her about her hypothyroidism, and developing a plan to confirm the diagnosis and long term treatment as necessary. Please refer to "Patient Self Inventory" in the Media tab for reviewed elements of pertinent patient history.  [name]  participated in the discussions, expressed understanding, and voiced agreement with the above plans.  All questions were answered to her satisfaction. she is encouraged to contact clinic should she have any questions or concerns prior to her return visit.   FOLLOW UP PLAN:  Return in about 8 weeks (around 07/22/2021) for Thyroid follow up, Previsit labs.  Rayetta Pigg, [facility] [address] [place],  [address] Phone: [phone] Fax: [phone]  05/27/2021, 3:30 PM

## 2021-05-31 DIAGNOSIS — E785 Hyperlipidemia, unspecified: Secondary | ICD-10-CM | POA: Diagnosis not present

## 2021-05-31 DIAGNOSIS — R7989 Other specified abnormal findings of blood chemistry: Secondary | ICD-10-CM | POA: Diagnosis not present

## 2021-05-31 DIAGNOSIS — E039 Hypothyroidism, unspecified: Secondary | ICD-10-CM | POA: Diagnosis not present

## 2021-05-31 DIAGNOSIS — F419 Anxiety disorder, unspecified: Secondary | ICD-10-CM | POA: Diagnosis not present

## 2021-05-31 DIAGNOSIS — F32A Depression, unspecified: Secondary | ICD-10-CM | POA: Diagnosis not present

## 2021-06-01 DIAGNOSIS — I69359 Hemiplegia and hemiparesis following cerebral infarction affecting unspecified side: Secondary | ICD-10-CM | POA: Diagnosis not present

## 2021-06-01 DIAGNOSIS — R2689 Other abnormalities of gait and mobility: Secondary | ICD-10-CM | POA: Diagnosis not present

## 2021-06-01 DIAGNOSIS — I671 Cerebral aneurysm, nonruptured: Secondary | ICD-10-CM | POA: Diagnosis not present

## 2021-06-01 DIAGNOSIS — Z79899 Other long term (current) drug therapy: Secondary | ICD-10-CM | POA: Diagnosis not present

## 2021-06-01 DIAGNOSIS — G20 Parkinson's disease: Secondary | ICD-10-CM | POA: Diagnosis not present

## 2021-06-03 DIAGNOSIS — R2689 Other abnormalities of gait and mobility: Secondary | ICD-10-CM | POA: Diagnosis not present

## 2021-06-03 DIAGNOSIS — I672 Cerebral atherosclerosis: Secondary | ICD-10-CM | POA: Diagnosis not present

## 2021-06-03 DIAGNOSIS — M6281 Muscle weakness (generalized): Secondary | ICD-10-CM | POA: Diagnosis not present

## 2021-06-07 DIAGNOSIS — I10 Essential (primary) hypertension: Secondary | ICD-10-CM | POA: Diagnosis not present

## 2021-06-07 DIAGNOSIS — M6281 Muscle weakness (generalized): Secondary | ICD-10-CM | POA: Diagnosis not present

## 2021-06-07 DIAGNOSIS — R2689 Other abnormalities of gait and mobility: Secondary | ICD-10-CM | POA: Diagnosis not present

## 2021-06-07 DIAGNOSIS — F909 Attention-deficit hyperactivity disorder, unspecified type: Secondary | ICD-10-CM | POA: Diagnosis not present

## 2021-06-07 DIAGNOSIS — I672 Cerebral atherosclerosis: Secondary | ICD-10-CM | POA: Diagnosis not present

## 2021-06-07 DIAGNOSIS — F32A Depression, unspecified: Secondary | ICD-10-CM | POA: Diagnosis not present

## 2021-06-07 DIAGNOSIS — Z79899 Other long term (current) drug therapy: Secondary | ICD-10-CM | POA: Diagnosis not present

## 2021-06-07 DIAGNOSIS — E039 Hypothyroidism, unspecified: Secondary | ICD-10-CM | POA: Diagnosis not present

## 2021-06-07 DIAGNOSIS — E785 Hyperlipidemia, unspecified: Secondary | ICD-10-CM | POA: Diagnosis not present

## 2021-06-07 DIAGNOSIS — F419 Anxiety disorder, unspecified: Secondary | ICD-10-CM | POA: Diagnosis not present

## 2021-06-08 DIAGNOSIS — M6281 Muscle weakness (generalized): Secondary | ICD-10-CM | POA: Diagnosis not present

## 2021-06-08 DIAGNOSIS — R2689 Other abnormalities of gait and mobility: Secondary | ICD-10-CM | POA: Diagnosis not present

## 2021-06-08 DIAGNOSIS — I672 Cerebral atherosclerosis: Secondary | ICD-10-CM | POA: Diagnosis not present

## 2021-06-09 DIAGNOSIS — M6281 Muscle weakness (generalized): Secondary | ICD-10-CM | POA: Diagnosis not present

## 2021-06-09 DIAGNOSIS — I672 Cerebral atherosclerosis: Secondary | ICD-10-CM | POA: Diagnosis not present

## 2021-06-09 DIAGNOSIS — R2689 Other abnormalities of gait and mobility: Secondary | ICD-10-CM | POA: Diagnosis not present

## 2021-06-10 DIAGNOSIS — G8194 Hemiplegia, unspecified affecting left nondominant side: Secondary | ICD-10-CM | POA: Diagnosis not present

## 2021-06-10 DIAGNOSIS — R2689 Other abnormalities of gait and mobility: Secondary | ICD-10-CM | POA: Diagnosis not present

## 2021-06-10 DIAGNOSIS — E039 Hypothyroidism, unspecified: Secondary | ICD-10-CM | POA: Diagnosis not present

## 2021-06-10 DIAGNOSIS — I679 Cerebrovascular disease, unspecified: Secondary | ICD-10-CM | POA: Diagnosis not present

## 2021-06-10 DIAGNOSIS — E785 Hyperlipidemia, unspecified: Secondary | ICD-10-CM | POA: Diagnosis not present

## 2021-06-10 DIAGNOSIS — M6281 Muscle weakness (generalized): Secondary | ICD-10-CM | POA: Diagnosis not present

## 2021-06-10 DIAGNOSIS — I10 Essential (primary) hypertension: Secondary | ICD-10-CM | POA: Diagnosis not present

## 2021-06-10 DIAGNOSIS — F32A Depression, unspecified: Secondary | ICD-10-CM | POA: Diagnosis not present

## 2021-06-10 DIAGNOSIS — F419 Anxiety disorder, unspecified: Secondary | ICD-10-CM | POA: Diagnosis not present

## 2021-06-10 DIAGNOSIS — I672 Cerebral atherosclerosis: Secondary | ICD-10-CM | POA: Diagnosis not present

## 2021-06-11 DIAGNOSIS — F411 Generalized anxiety disorder: Secondary | ICD-10-CM | POA: Diagnosis not present

## 2021-06-11 DIAGNOSIS — F330 Major depressive disorder, recurrent, mild: Secondary | ICD-10-CM | POA: Diagnosis not present

## 2021-06-11 DIAGNOSIS — F0390 Unspecified dementia without behavioral disturbance: Secondary | ICD-10-CM | POA: Diagnosis not present

## 2021-06-14 DIAGNOSIS — I672 Cerebral atherosclerosis: Secondary | ICD-10-CM | POA: Diagnosis not present

## 2021-06-14 DIAGNOSIS — M6281 Muscle weakness (generalized): Secondary | ICD-10-CM | POA: Diagnosis not present

## 2021-06-14 DIAGNOSIS — R2689 Other abnormalities of gait and mobility: Secondary | ICD-10-CM | POA: Diagnosis not present

## 2021-06-15 DIAGNOSIS — R2689 Other abnormalities of gait and mobility: Secondary | ICD-10-CM | POA: Diagnosis not present

## 2021-06-15 DIAGNOSIS — I672 Cerebral atherosclerosis: Secondary | ICD-10-CM | POA: Diagnosis not present

## 2021-06-15 DIAGNOSIS — M6281 Muscle weakness (generalized): Secondary | ICD-10-CM | POA: Diagnosis not present

## 2021-06-16 DIAGNOSIS — I672 Cerebral atherosclerosis: Secondary | ICD-10-CM | POA: Diagnosis not present

## 2021-06-16 DIAGNOSIS — R2689 Other abnormalities of gait and mobility: Secondary | ICD-10-CM | POA: Diagnosis not present

## 2021-06-16 DIAGNOSIS — M6281 Muscle weakness (generalized): Secondary | ICD-10-CM | POA: Diagnosis not present

## 2021-06-18 DIAGNOSIS — R2689 Other abnormalities of gait and mobility: Secondary | ICD-10-CM | POA: Diagnosis not present

## 2021-06-18 DIAGNOSIS — I672 Cerebral atherosclerosis: Secondary | ICD-10-CM | POA: Diagnosis not present

## 2021-06-18 DIAGNOSIS — M6281 Muscle weakness (generalized): Secondary | ICD-10-CM | POA: Diagnosis not present

## 2021-06-21 DIAGNOSIS — R2689 Other abnormalities of gait and mobility: Secondary | ICD-10-CM | POA: Diagnosis not present

## 2021-06-21 DIAGNOSIS — M6281 Muscle weakness (generalized): Secondary | ICD-10-CM | POA: Diagnosis not present

## 2021-06-21 DIAGNOSIS — I672 Cerebral atherosclerosis: Secondary | ICD-10-CM | POA: Diagnosis not present

## 2021-06-22 DIAGNOSIS — M6281 Muscle weakness (generalized): Secondary | ICD-10-CM | POA: Diagnosis not present

## 2021-06-22 DIAGNOSIS — I672 Cerebral atherosclerosis: Secondary | ICD-10-CM | POA: Diagnosis not present

## 2021-06-22 DIAGNOSIS — R2689 Other abnormalities of gait and mobility: Secondary | ICD-10-CM | POA: Diagnosis not present

## 2021-06-23 DIAGNOSIS — I672 Cerebral atherosclerosis: Secondary | ICD-10-CM | POA: Diagnosis not present

## 2021-06-23 DIAGNOSIS — R2689 Other abnormalities of gait and mobility: Secondary | ICD-10-CM | POA: Diagnosis not present

## 2021-06-23 DIAGNOSIS — M6281 Muscle weakness (generalized): Secondary | ICD-10-CM | POA: Diagnosis not present

## 2021-06-25 DIAGNOSIS — M6281 Muscle weakness (generalized): Secondary | ICD-10-CM | POA: Diagnosis not present

## 2021-06-25 DIAGNOSIS — I672 Cerebral atherosclerosis: Secondary | ICD-10-CM | POA: Diagnosis not present

## 2021-06-25 DIAGNOSIS — R2689 Other abnormalities of gait and mobility: Secondary | ICD-10-CM | POA: Diagnosis not present

## 2021-06-28 DIAGNOSIS — Z79899 Other long term (current) drug therapy: Secondary | ICD-10-CM | POA: Diagnosis not present

## 2021-06-28 DIAGNOSIS — F32A Depression, unspecified: Secondary | ICD-10-CM | POA: Diagnosis not present

## 2021-06-28 DIAGNOSIS — F419 Anxiety disorder, unspecified: Secondary | ICD-10-CM | POA: Diagnosis not present

## 2021-06-28 DIAGNOSIS — M6281 Muscle weakness (generalized): Secondary | ICD-10-CM | POA: Diagnosis not present

## 2021-06-28 DIAGNOSIS — E039 Hypothyroidism, unspecified: Secondary | ICD-10-CM | POA: Diagnosis not present

## 2021-06-28 DIAGNOSIS — R2689 Other abnormalities of gait and mobility: Secondary | ICD-10-CM | POA: Diagnosis not present

## 2021-06-28 DIAGNOSIS — F909 Attention-deficit hyperactivity disorder, unspecified type: Secondary | ICD-10-CM | POA: Diagnosis not present

## 2021-06-28 DIAGNOSIS — I672 Cerebral atherosclerosis: Secondary | ICD-10-CM | POA: Diagnosis not present

## 2021-06-28 DIAGNOSIS — I10 Essential (primary) hypertension: Secondary | ICD-10-CM | POA: Diagnosis not present

## 2021-06-29 DIAGNOSIS — I672 Cerebral atherosclerosis: Secondary | ICD-10-CM | POA: Diagnosis not present

## 2021-06-29 DIAGNOSIS — M6281 Muscle weakness (generalized): Secondary | ICD-10-CM | POA: Diagnosis not present

## 2021-06-29 DIAGNOSIS — R2689 Other abnormalities of gait and mobility: Secondary | ICD-10-CM | POA: Diagnosis not present

## 2021-06-30 DIAGNOSIS — I672 Cerebral atherosclerosis: Secondary | ICD-10-CM | POA: Diagnosis not present

## 2021-06-30 DIAGNOSIS — R2689 Other abnormalities of gait and mobility: Secondary | ICD-10-CM | POA: Diagnosis not present

## 2021-06-30 DIAGNOSIS — M6281 Muscle weakness (generalized): Secondary | ICD-10-CM | POA: Diagnosis not present

## 2021-07-01 DIAGNOSIS — F419 Anxiety disorder, unspecified: Secondary | ICD-10-CM | POA: Diagnosis not present

## 2021-07-01 DIAGNOSIS — Z79899 Other long term (current) drug therapy: Secondary | ICD-10-CM | POA: Diagnosis not present

## 2021-07-01 DIAGNOSIS — I679 Cerebrovascular disease, unspecified: Secondary | ICD-10-CM | POA: Diagnosis not present

## 2021-07-01 DIAGNOSIS — Z720 Tobacco use: Secondary | ICD-10-CM | POA: Diagnosis not present

## 2021-07-01 DIAGNOSIS — I672 Cerebral atherosclerosis: Secondary | ICD-10-CM | POA: Diagnosis not present

## 2021-07-01 DIAGNOSIS — R2689 Other abnormalities of gait and mobility: Secondary | ICD-10-CM | POA: Diagnosis not present

## 2021-07-01 DIAGNOSIS — F909 Attention-deficit hyperactivity disorder, unspecified type: Secondary | ICD-10-CM | POA: Diagnosis not present

## 2021-07-01 DIAGNOSIS — E039 Hypothyroidism, unspecified: Secondary | ICD-10-CM | POA: Diagnosis not present

## 2021-07-01 DIAGNOSIS — F32A Depression, unspecified: Secondary | ICD-10-CM | POA: Diagnosis not present

## 2021-07-01 DIAGNOSIS — M6281 Muscle weakness (generalized): Secondary | ICD-10-CM | POA: Diagnosis not present

## 2021-07-01 DIAGNOSIS — F411 Generalized anxiety disorder: Secondary | ICD-10-CM | POA: Diagnosis not present

## 2021-07-01 DIAGNOSIS — E539 Vitamin B deficiency, unspecified: Secondary | ICD-10-CM | POA: Diagnosis not present

## 2021-07-01 DIAGNOSIS — F321 Major depressive disorder, single episode, moderate: Secondary | ICD-10-CM | POA: Diagnosis not present

## 2021-07-02 DIAGNOSIS — M6281 Muscle weakness (generalized): Secondary | ICD-10-CM | POA: Diagnosis not present

## 2021-07-02 DIAGNOSIS — I672 Cerebral atherosclerosis: Secondary | ICD-10-CM | POA: Diagnosis not present

## 2021-07-02 DIAGNOSIS — R2689 Other abnormalities of gait and mobility: Secondary | ICD-10-CM | POA: Diagnosis not present

## 2021-07-05 DIAGNOSIS — R2689 Other abnormalities of gait and mobility: Secondary | ICD-10-CM | POA: Diagnosis not present

## 2021-07-05 DIAGNOSIS — I672 Cerebral atherosclerosis: Secondary | ICD-10-CM | POA: Diagnosis not present

## 2021-07-05 DIAGNOSIS — M6281 Muscle weakness (generalized): Secondary | ICD-10-CM | POA: Diagnosis not present

## 2021-07-06 DIAGNOSIS — I672 Cerebral atherosclerosis: Secondary | ICD-10-CM | POA: Diagnosis not present

## 2021-07-06 DIAGNOSIS — M6281 Muscle weakness (generalized): Secondary | ICD-10-CM | POA: Diagnosis not present

## 2021-07-06 DIAGNOSIS — R2689 Other abnormalities of gait and mobility: Secondary | ICD-10-CM | POA: Diagnosis not present

## 2021-07-08 DIAGNOSIS — M6281 Muscle weakness (generalized): Secondary | ICD-10-CM | POA: Diagnosis not present

## 2021-07-08 DIAGNOSIS — I672 Cerebral atherosclerosis: Secondary | ICD-10-CM | POA: Diagnosis not present

## 2021-07-08 DIAGNOSIS — R2689 Other abnormalities of gait and mobility: Secondary | ICD-10-CM | POA: Diagnosis not present

## 2021-07-08 DIAGNOSIS — I679 Cerebrovascular disease, unspecified: Secondary | ICD-10-CM | POA: Diagnosis not present

## 2021-07-08 DIAGNOSIS — E039 Hypothyroidism, unspecified: Secondary | ICD-10-CM | POA: Diagnosis not present

## 2021-07-08 DIAGNOSIS — E559 Vitamin D deficiency, unspecified: Secondary | ICD-10-CM | POA: Diagnosis not present

## 2021-07-09 DIAGNOSIS — R2689 Other abnormalities of gait and mobility: Secondary | ICD-10-CM | POA: Diagnosis not present

## 2021-07-09 DIAGNOSIS — I672 Cerebral atherosclerosis: Secondary | ICD-10-CM | POA: Diagnosis not present

## 2021-07-09 DIAGNOSIS — M6281 Muscle weakness (generalized): Secondary | ICD-10-CM | POA: Diagnosis not present

## 2021-07-12 DIAGNOSIS — I672 Cerebral atherosclerosis: Secondary | ICD-10-CM | POA: Diagnosis not present

## 2021-07-12 DIAGNOSIS — M6281 Muscle weakness (generalized): Secondary | ICD-10-CM | POA: Diagnosis not present

## 2021-07-12 DIAGNOSIS — R2689 Other abnormalities of gait and mobility: Secondary | ICD-10-CM | POA: Diagnosis not present

## 2021-07-13 DIAGNOSIS — I672 Cerebral atherosclerosis: Secondary | ICD-10-CM | POA: Diagnosis not present

## 2021-07-13 DIAGNOSIS — F321 Major depressive disorder, single episode, moderate: Secondary | ICD-10-CM | POA: Diagnosis not present

## 2021-07-13 DIAGNOSIS — M6281 Muscle weakness (generalized): Secondary | ICD-10-CM | POA: Diagnosis not present

## 2021-07-13 DIAGNOSIS — F411 Generalized anxiety disorder: Secondary | ICD-10-CM | POA: Diagnosis not present

## 2021-07-13 DIAGNOSIS — R2689 Other abnormalities of gait and mobility: Secondary | ICD-10-CM | POA: Diagnosis not present

## 2021-07-15 DIAGNOSIS — M6281 Muscle weakness (generalized): Secondary | ICD-10-CM | POA: Diagnosis not present

## 2021-07-15 DIAGNOSIS — I672 Cerebral atherosclerosis: Secondary | ICD-10-CM | POA: Diagnosis not present

## 2021-07-15 DIAGNOSIS — R2689 Other abnormalities of gait and mobility: Secondary | ICD-10-CM | POA: Diagnosis not present

## 2021-07-16 DIAGNOSIS — M6281 Muscle weakness (generalized): Secondary | ICD-10-CM | POA: Diagnosis not present

## 2021-07-16 DIAGNOSIS — I672 Cerebral atherosclerosis: Secondary | ICD-10-CM | POA: Diagnosis not present

## 2021-07-16 DIAGNOSIS — R2689 Other abnormalities of gait and mobility: Secondary | ICD-10-CM | POA: Diagnosis not present

## 2021-07-19 DIAGNOSIS — F321 Major depressive disorder, single episode, moderate: Secondary | ICD-10-CM | POA: Diagnosis not present

## 2021-07-19 DIAGNOSIS — F411 Generalized anxiety disorder: Secondary | ICD-10-CM | POA: Diagnosis not present

## 2021-07-20 DIAGNOSIS — R2689 Other abnormalities of gait and mobility: Secondary | ICD-10-CM | POA: Diagnosis not present

## 2021-07-20 DIAGNOSIS — M6281 Muscle weakness (generalized): Secondary | ICD-10-CM | POA: Diagnosis not present

## 2021-07-20 DIAGNOSIS — I672 Cerebral atherosclerosis: Secondary | ICD-10-CM | POA: Diagnosis not present

## 2021-07-21 DIAGNOSIS — I672 Cerebral atherosclerosis: Secondary | ICD-10-CM | POA: Diagnosis not present

## 2021-07-21 DIAGNOSIS — M6281 Muscle weakness (generalized): Secondary | ICD-10-CM | POA: Diagnosis not present

## 2021-07-21 DIAGNOSIS — R2689 Other abnormalities of gait and mobility: Secondary | ICD-10-CM | POA: Diagnosis not present

## 2021-07-22 ENCOUNTER — Ambulatory Visit: Payer: Medicare HMO | Admitting: Nurse Practitioner

## 2021-07-22 DIAGNOSIS — M6281 Muscle weakness (generalized): Secondary | ICD-10-CM | POA: Diagnosis not present

## 2021-07-22 DIAGNOSIS — I672 Cerebral atherosclerosis: Secondary | ICD-10-CM | POA: Diagnosis not present

## 2021-07-22 DIAGNOSIS — R2689 Other abnormalities of gait and mobility: Secondary | ICD-10-CM | POA: Diagnosis not present

## 2021-07-22 DIAGNOSIS — E039 Hypothyroidism, unspecified: Secondary | ICD-10-CM | POA: Diagnosis not present

## 2021-07-22 LAB — TSH: TSH: 1.42 (ref 0.41–5.90)

## 2021-07-23 DIAGNOSIS — R2689 Other abnormalities of gait and mobility: Secondary | ICD-10-CM | POA: Diagnosis not present

## 2021-07-23 DIAGNOSIS — M6281 Muscle weakness (generalized): Secondary | ICD-10-CM | POA: Diagnosis not present

## 2021-07-23 DIAGNOSIS — I672 Cerebral atherosclerosis: Secondary | ICD-10-CM | POA: Diagnosis not present

## 2021-07-25 DIAGNOSIS — F411 Generalized anxiety disorder: Secondary | ICD-10-CM | POA: Diagnosis not present

## 2021-07-25 DIAGNOSIS — F321 Major depressive disorder, single episode, moderate: Secondary | ICD-10-CM | POA: Diagnosis not present

## 2021-07-26 DIAGNOSIS — E539 Vitamin B deficiency, unspecified: Secondary | ICD-10-CM | POA: Diagnosis not present

## 2021-07-26 DIAGNOSIS — E785 Hyperlipidemia, unspecified: Secondary | ICD-10-CM | POA: Diagnosis not present

## 2021-07-26 DIAGNOSIS — F419 Anxiety disorder, unspecified: Secondary | ICD-10-CM | POA: Diagnosis not present

## 2021-07-26 DIAGNOSIS — F330 Major depressive disorder, recurrent, mild: Secondary | ICD-10-CM | POA: Diagnosis not present

## 2021-07-26 DIAGNOSIS — F32A Depression, unspecified: Secondary | ICD-10-CM | POA: Diagnosis not present

## 2021-07-26 DIAGNOSIS — M6281 Muscle weakness (generalized): Secondary | ICD-10-CM | POA: Diagnosis not present

## 2021-07-26 DIAGNOSIS — R2689 Other abnormalities of gait and mobility: Secondary | ICD-10-CM | POA: Diagnosis not present

## 2021-07-26 DIAGNOSIS — F909 Attention-deficit hyperactivity disorder, unspecified type: Secondary | ICD-10-CM | POA: Diagnosis not present

## 2021-07-26 DIAGNOSIS — F411 Generalized anxiety disorder: Secondary | ICD-10-CM | POA: Diagnosis not present

## 2021-07-26 DIAGNOSIS — G2401 Drug induced subacute dyskinesia: Secondary | ICD-10-CM | POA: Diagnosis not present

## 2021-07-26 DIAGNOSIS — F0390 Unspecified dementia without behavioral disturbance: Secondary | ICD-10-CM | POA: Diagnosis not present

## 2021-07-26 DIAGNOSIS — I672 Cerebral atherosclerosis: Secondary | ICD-10-CM | POA: Diagnosis not present

## 2021-07-26 DIAGNOSIS — Z79899 Other long term (current) drug therapy: Secondary | ICD-10-CM | POA: Diagnosis not present

## 2021-07-26 DIAGNOSIS — E039 Hypothyroidism, unspecified: Secondary | ICD-10-CM | POA: Diagnosis not present

## 2021-07-26 NOTE — Patient Instructions (Signed)

## 2021-07-27 ENCOUNTER — Encounter: Payer: Self-pay | Admitting: Nurse Practitioner

## 2021-07-27 ENCOUNTER — Ambulatory Visit: Payer: Medicare HMO | Admitting: Nurse Practitioner

## 2021-07-27 ENCOUNTER — Other Ambulatory Visit: Payer: Self-pay

## 2021-07-27 ENCOUNTER — Ambulatory Visit (INDEPENDENT_AMBULATORY_CARE_PROVIDER_SITE_OTHER): Payer: Medicare HMO | Admitting: Nurse Practitioner

## 2021-07-27 VITALS — BP 124/75 | HR 79 | Ht 62.0 in | Wt 133.0 lb

## 2021-07-27 DIAGNOSIS — I672 Cerebral atherosclerosis: Secondary | ICD-10-CM | POA: Diagnosis not present

## 2021-07-27 DIAGNOSIS — M6281 Muscle weakness (generalized): Secondary | ICD-10-CM | POA: Diagnosis not present

## 2021-07-27 DIAGNOSIS — R2689 Other abnormalities of gait and mobility: Secondary | ICD-10-CM | POA: Diagnosis not present

## 2021-07-27 DIAGNOSIS — E890 Postprocedural hypothyroidism: Secondary | ICD-10-CM

## 2021-07-27 NOTE — Progress Notes (Signed)
Endocrinology Follow Up Note                                         07/27/2021, 11:40 AM  Subjective:   Subjective    Susan Davidson is a 70 y.o.-year-old female patient being seen in follow up after being seen in consultation for hypothyroidism referred by Caprice Renshaw, MD.   Past Medical History:  Diagnosis Date   ANXIETY 02/12/2008   Qualifier: Diagnosis of  By: Jenny Reichmann MD, Hunt Oris    DEPRESSION 02/12/2008   Qualifier: Diagnosis of  By: Elveria Royals    HYPERLIPIDEMIA 02/12/2008   Qualifier: Diagnosis of  By: Elveria Royals    HYPERTENSION 02/12/2008   Qualifier: Diagnosis of  By: Sherwood, [place] 02/12/2008   Qualifier: Diagnosis of  By: Elveria Royals    Impaired glucose tolerance 08/27/2011   OSTEOPENIA 02/12/2008   Qualifier: Diagnosis of  By: Jenny Reichmann MD, Hunt Oris    VITAMIN D DEFICIENCY 04/14/2010   Qualifier: Diagnosis of  By: Jenny Reichmann MD, Hunt Oris     History reviewed. No pertinent surgical history.  Social History   Socioeconomic History   Marital status: Married    Spouse name: Not on file   Number of children: Not on file   Years of education: Not on file   Highest education level: Not on file  Occupational History   Not on file  Tobacco Use   Smoking status: Every Day   Smokeless tobacco: Never  Vaping Use   Vaping Use: Never used  Substance and Sexual Activity   Alcohol use: Yes   Drug use: No   Sexual activity: Not on file  Other Topics Concern   Not on file  Social History Narrative   Not on file   Social Determinants of Health   Financial Resource Strain: Not on file  Food Insecurity: Not on file  Transportation Needs: Not on file  Physical Activity: Not on file  Stress: Not on file  Social Connections: Not on file    Family History  Problem Relation Age of Onset   Heart disease Father    Bipolar disorder Sister    Diabetes Neg  Hx     Outpatient Encounter Medications as of 07/27/2021  Medication Sig   amphetamine-dextroamphetamine (ADDERALL) 20 MG tablet Take 1 tablet (20 mg total) by mouth 2 (two) times daily.   Cholecalciferol (VITAMIN D3) 50 MCG (2000 UT) TABS Take 1 tablet by mouth daily.   clopidogrel (PLAVIX) 75 MG tablet Take 1 tablet (75 mg total) by mouth daily.   levothyroxine (SYNTHROID) 88 MCG tablet Take 1 tablet (88 mcg total) by mouth daily.   lisinopril (ZESTRIL) 10 MG tablet Take 1 tablet (10 mg total) by mouth daily.   LORazepam (ATIVAN) 1 MG tablet Take 1 mg by mouth 2 (two) times daily.   melatonin 5 MG TABS Take 5 mg by mouth.   mirtazapine (REMERON) 7.5 MG tablet Take 7.5  mg by mouth at bedtime.   Multiple Vitamin (MULTIVITAMIN) tablet Take 1 tablet by mouth daily.   PARoxetine (PAXIL) 20 MG tablet Take 20 mg by mouth daily.   risperiDONE (RISPERDAL) 1 MG tablet Take 1 mg by mouth 2 (two) times daily.   rosuvastatin (CRESTOR) 5 MG tablet Take 5 mg by mouth daily.   vitamin B-12 (CYANOCOBALAMIN) 1000 MCG tablet Take 1 tablet (1,000 mcg total) by mouth daily.   No facility-administered encounter medications on file as of 07/27/2021.    ALLERGIES: Allergies  Allergen Reactions   Other Anaphylaxis    Fire ant venom   Epinephrine    Influenza Vac Split Quad     fatigue   VACCINATION STATUS: Immunization History  Administered Date(s) Administered   Moderna Sars-Covid-2 Vaccination 12/02/2019   Tdap 10/19/2012     HPI   TRIVA HUEBER  is a patient who resides in a nursing home with the above medical history. she was diagnosed with hyperthyroidism at approximate age of 7 years, which required RAI ablation and subsequent initiation of thyroid hormone replacement. she was given various doses of Levothyroxine over the years, currently on 88 mcg po daily before breakfast.  The nursing home has been more consistent with giving medication properly so it can be absorbed properly.  I  reviewed patient's thyroid tests:  Lab Results  Component Value Date   TSH 1.42 07/22/2021   TSH 20.460 (H) 05/05/2021   TSH 26.064 (H) 05/04/2021   TSH 26.362 (H) 01/27/2021   TSH 39.15 (H) 11/09/2020   TSH 34.78 (H) 03/11/2020   TSH 30.78 (H) 11/02/2018   TSH 0.08 (L) 05/11/2017   TSH 0.34 (L) 04/14/2016   TSH 0.17 (L) 01/22/2015   FREET4 0.66 01/28/2021   FREET4 0.38 (L) 11/09/2020     Patient has no complaints today.  Pt denies feeling nodules in neck, hoarseness, dysphagia/odynophagia, SOB with lying down.  she denies family history of thyroid disorders.  No family history of thyroid cancer.   No recent use of iodine supplements.  Denies use of Biotin containing supplements.  She has never had any previous imaging of her thyroid in the past.  I reviewed her chart and she also has a history of CVA, encephalopathy, CKD, depression, HLD, Osteopenia, vitamin D deficiency.   ROS:  Constitutional: no weight gain/loss, + fatigue-improved, no subjective hyperthermia, + subjective hypothermia-improved Eyes: no blurry vision, no xerophthalmia ENT: no sore throat, no nodules palpated in throat, no dysphagia/odynophagia, no hoarseness Cardiovascular: no chest pain, no SOB, no palpitations, no leg swelling Respiratory: no cough, no SOB Gastrointestinal: no nausea/vomiting/diarrhea Musculoskeletal: no muscle/joint aches, in WC related to recent stroke affecting left side Skin: no rashes Neurological: no tremors, no numbness, no tingling, no dizziness Psychiatric: no depression, no anxiety   Objective:   Objective     BP 124/75   Pulse 79   Ht 5\' 2"  (1.575 m)   Wt 133 lb (60.3 kg)   BMI 24.33 kg/m  Wt Readings from Last 3 Encounters:  07/27/21 133 lb (60.3 kg)  05/27/21 133 lb (60.3 kg)  05/04/21 132 lb 4.4 oz (60 kg)    BP Readings from Last 3 Encounters:  07/27/21 124/75  05/27/21 (!) 94/58  05/07/21 (!) 170/94      Physical Exam- Limited  Constitutional:   Body mass index is 24.33 kg/m. , not in acute distress, normal state of mind Eyes:  EOMI, no exophthalmos Neck: Supple Thyroid: No gross goiter Cardiovascular: RRR,  no murmurs, rubs, or gallops, no edema Respiratory: Adequate breathing efforts, no crackles, rales, rhonchi, or wheezing Musculoskeletal: no gross deformities, WC bound due to recent stroke Skin:  no rashes, no hyperemia Neurological: no tremor with outstretched hands   CMP ( most recent) CMP     Component Value Date/Time   NA 141 05/07/2021 0530   K 4.0 05/07/2021 0530   CL 108 05/07/2021 0530   CO2 26 05/07/2021 0530   GLUCOSE 97 05/07/2021 0530   BUN 13 05/07/2021 0530   CREATININE 0.97 05/07/2021 0530   CALCIUM 8.7 (L) 05/07/2021 0530   PROT 7.3 05/05/2021 0426   ALBUMIN 3.8 05/05/2021 0426   AST 16 05/05/2021 0426   ALT 12 05/05/2021 0426   ALKPHOS 49 05/05/2021 0426   BILITOT 0.5 05/05/2021 0426   GFRNONAA >60 05/07/2021 0530   GFRAA 73 02/12/2008 1525     Diabetic Labs (most recent): Lab Results  Component Value Date   HGBA1C 6.4 (H) 01/27/2021   HGBA1C 6.6 (H) 11/09/2020   HGBA1C 6.3 11/02/2018     Lipid Panel ( most recent) Lipid Panel     Component Value Date/Time   CHOL 184 01/27/2021 0555   TRIG 172 (H) 05/04/2021 1111   HDL 56 01/27/2021 0555   CHOLHDL 3.3 01/27/2021 0555   VLDL 24 01/27/2021 0555   LDLCALC 104 (H) 01/27/2021 0555   LDLDIRECT 137.5 10/19/2012 1106       Lab Results  Component Value Date   TSH 1.42 07/22/2021   TSH 20.460 (H) 05/05/2021   TSH 26.064 (H) 05/04/2021   TSH 26.362 (H) 01/27/2021   TSH 39.15 (H) 11/09/2020   TSH 34.78 (H) 03/11/2020   TSH 30.78 (H) 11/02/2018   TSH 0.08 (L) 05/11/2017   TSH 0.34 (L) 04/14/2016   TSH 0.17 (L) 01/22/2015   FREET4 0.66 01/28/2021   FREET4 0.38 (L) 11/09/2020      Assessment & Plan:   ASSESSMENT / PLAN:  1. Hypothyroidism-s/p RAI ablation for hyperthyroidism many years ago   Patient with long-standing  hypothyroidism, on Levothyroxine therapy. On physical exam, patient  does not have gross goiter, thyroid nodules, or neck compression symptoms.     -Her previsit thyroid function tests are consistent with appropriate hormone replacement.  She is advised to continue Levothyroxine 88 mcg po daily before breakfast.   - We discussed about correct intake of levothyroxine, at fasting, with water, separated by at least 30 minutes from breakfast, and separated by more than 4 hours from calcium, iron, multivitamins, acid reflux medications (PPIs). -Patient is made aware of the fact that thyroid hormone replacement is needed for life, dose to be adjusted by periodic monitoring of thyroid function tests.  - Will check thyroid tests before next visit: TSH, free T4.    -Due to absence of clinical goiter, no need for thyroid ultrasound.     I spent 20 minutes in the care of the patient today including review of labs from Thyroid Function, CMP, and other relevant labs ; imaging/biopsy records (current and previous including abstractions from other facilities); face-to-face time discussing  her lab results and symptoms, medications doses, her options of short and long term treatment based on the latest standards of care / guidelines;   and documenting the encounter.  [name]  participated in the discussions, expressed understanding, and voiced agreement with the above plans.  All questions were answered to her satisfaction. she is encouraged to contact clinic should she have any questions  or concerns prior to her return visit.   FOLLOW UP PLAN:  Return in about 4 months (around 11/27/2021) for Thyroid follow up, Previsit labs.  Rayetta Pigg, [facility] [address] [place], [place] [address] Phone: [phone] Fax: [phone]  07/27/2021, 11:40 AM

## 2021-07-28 DIAGNOSIS — I671 Cerebral aneurysm, nonruptured: Secondary | ICD-10-CM | POA: Diagnosis not present

## 2021-07-28 DIAGNOSIS — F429 Obsessive-compulsive disorder, unspecified: Secondary | ICD-10-CM | POA: Diagnosis not present

## 2021-07-28 DIAGNOSIS — R2689 Other abnormalities of gait and mobility: Secondary | ICD-10-CM | POA: Diagnosis not present

## 2021-07-28 DIAGNOSIS — G20 Parkinson's disease: Secondary | ICD-10-CM | POA: Diagnosis not present

## 2021-07-28 DIAGNOSIS — I69359 Hemiplegia and hemiparesis following cerebral infarction affecting unspecified side: Secondary | ICD-10-CM | POA: Diagnosis not present

## 2021-07-28 DIAGNOSIS — Z79899 Other long term (current) drug therapy: Secondary | ICD-10-CM | POA: Diagnosis not present

## 2021-07-29 DIAGNOSIS — M6281 Muscle weakness (generalized): Secondary | ICD-10-CM | POA: Diagnosis not present

## 2021-07-29 DIAGNOSIS — R2689 Other abnormalities of gait and mobility: Secondary | ICD-10-CM | POA: Diagnosis not present

## 2021-07-29 DIAGNOSIS — I672 Cerebral atherosclerosis: Secondary | ICD-10-CM | POA: Diagnosis not present

## 2021-08-02 ENCOUNTER — Observation Stay (HOSPITAL_COMMUNITY)
Admission: EM | Admit: 2021-08-02 | Discharge: 2021-08-04 | Disposition: A | Discharge status: Home / Self Care | Payer: Medicare HMO | Attending: Family Medicine | Admitting: Family Medicine

## 2021-08-02 ENCOUNTER — Emergency Department (HOSPITAL_COMMUNITY): Payer: Medicare HMO

## 2021-08-02 ENCOUNTER — Telehealth (HOSPITAL_COMMUNITY): Payer: Self-pay

## 2021-08-02 ENCOUNTER — Other Ambulatory Visit (HOSPITAL_COMMUNITY): Payer: Self-pay | Admitting: Interventional Radiology

## 2021-08-02 ENCOUNTER — Encounter (HOSPITAL_COMMUNITY): Payer: Self-pay | Admitting: Emergency Medicine

## 2021-08-02 ENCOUNTER — Other Ambulatory Visit: Payer: Self-pay

## 2021-08-02 DIAGNOSIS — Z8673 Personal history of transient ischemic attack (TIA), and cerebral infarction without residual deficits: Secondary | ICD-10-CM | POA: Insufficient documentation

## 2021-08-02 DIAGNOSIS — F0390 Unspecified dementia without behavioral disturbance: Secondary | ICD-10-CM | POA: Diagnosis not present

## 2021-08-02 DIAGNOSIS — I639 Cerebral infarction, unspecified: Secondary | ICD-10-CM | POA: Diagnosis present

## 2021-08-02 DIAGNOSIS — E785 Hyperlipidemia, unspecified: Secondary | ICD-10-CM | POA: Diagnosis not present

## 2021-08-02 DIAGNOSIS — F17200 Nicotine dependence, unspecified, uncomplicated: Secondary | ICD-10-CM | POA: Insufficient documentation

## 2021-08-02 DIAGNOSIS — R4020 Unspecified coma: Secondary | ICD-10-CM | POA: Diagnosis not present

## 2021-08-02 DIAGNOSIS — F419 Anxiety disorder, unspecified: Secondary | ICD-10-CM | POA: Diagnosis not present

## 2021-08-02 DIAGNOSIS — F909 Attention-deficit hyperactivity disorder, unspecified type: Secondary | ICD-10-CM | POA: Diagnosis not present

## 2021-08-02 DIAGNOSIS — Z79899 Other long term (current) drug therapy: Secondary | ICD-10-CM | POA: Insufficient documentation

## 2021-08-02 DIAGNOSIS — R402421 Glasgow coma scale score 9-12, in the field [EMT or ambulance]: Secondary | ICD-10-CM | POA: Diagnosis not present

## 2021-08-02 DIAGNOSIS — Z7902 Long term (current) use of antithrombotics/antiplatelets: Secondary | ICD-10-CM | POA: Insufficient documentation

## 2021-08-02 DIAGNOSIS — I10 Essential (primary) hypertension: Secondary | ICD-10-CM | POA: Diagnosis not present

## 2021-08-02 DIAGNOSIS — E039 Hypothyroidism, unspecified: Secondary | ICD-10-CM | POA: Diagnosis not present

## 2021-08-02 DIAGNOSIS — U071 COVID-19: Secondary | ICD-10-CM

## 2021-08-02 DIAGNOSIS — R410 Disorientation, unspecified: Secondary | ICD-10-CM | POA: Diagnosis not present

## 2021-08-02 DIAGNOSIS — R2689 Other abnormalities of gait and mobility: Secondary | ICD-10-CM | POA: Diagnosis not present

## 2021-08-02 DIAGNOSIS — G8194 Hemiplegia, unspecified affecting left nondominant side: Secondary | ICD-10-CM | POA: Diagnosis not present

## 2021-08-02 DIAGNOSIS — R4189 Other symptoms and signs involving cognitive functions and awareness: Secondary | ICD-10-CM | POA: Diagnosis not present

## 2021-08-02 DIAGNOSIS — I672 Cerebral atherosclerosis: Secondary | ICD-10-CM | POA: Diagnosis not present

## 2021-08-02 DIAGNOSIS — N183 Chronic kidney disease, stage 3 unspecified: Secondary | ICD-10-CM | POA: Diagnosis present

## 2021-08-02 DIAGNOSIS — R404 Transient alteration of awareness: Secondary | ICD-10-CM | POA: Diagnosis not present

## 2021-08-02 DIAGNOSIS — I129 Hypertensive chronic kidney disease with stage 1 through stage 4 chronic kidney disease, or unspecified chronic kidney disease: Secondary | ICD-10-CM | POA: Diagnosis not present

## 2021-08-02 DIAGNOSIS — F32A Depression, unspecified: Secondary | ICD-10-CM | POA: Diagnosis not present

## 2021-08-02 DIAGNOSIS — R0989 Other specified symptoms and signs involving the circulatory and respiratory systems: Secondary | ICD-10-CM | POA: Diagnosis not present

## 2021-08-02 DIAGNOSIS — N1832 Chronic kidney disease, stage 3b: Secondary | ICD-10-CM | POA: Insufficient documentation

## 2021-08-02 DIAGNOSIS — R0689 Other abnormalities of breathing: Secondary | ICD-10-CM | POA: Diagnosis not present

## 2021-08-02 DIAGNOSIS — M6281 Muscle weakness (generalized): Secondary | ICD-10-CM | POA: Diagnosis not present

## 2021-08-02 DIAGNOSIS — I959 Hypotension, unspecified: Secondary | ICD-10-CM | POA: Diagnosis present

## 2021-08-02 DIAGNOSIS — I679 Cerebrovascular disease, unspecified: Secondary | ICD-10-CM | POA: Diagnosis not present

## 2021-08-02 DIAGNOSIS — I671 Cerebral aneurysm, nonruptured: Secondary | ICD-10-CM

## 2021-08-02 DIAGNOSIS — R52 Pain, unspecified: Secondary | ICD-10-CM | POA: Diagnosis not present

## 2021-08-02 DIAGNOSIS — R55 Syncope and collapse: Secondary | ICD-10-CM | POA: Diagnosis not present

## 2021-08-02 LAB — URINALYSIS, ROUTINE W REFLEX MICROSCOPIC
Bacteria, UA: NONE SEEN
Bilirubin Urine: NEGATIVE
Glucose, UA: NEGATIVE mg/dL
Hgb urine dipstick: NEGATIVE
Ketones, ur: 20 mg/dL — AB
Nitrite: NEGATIVE
Protein, ur: NEGATIVE mg/dL
Specific Gravity, Urine: 1.02 (ref 1.005–1.030)
Trans Epithel, UA: 1
pH: 5 (ref 5.0–8.0)

## 2021-08-02 LAB — CBC WITH DIFFERENTIAL/PLATELET
Abs Immature Granulocytes: 0.02 10*3/uL (ref 0.00–0.07)
Basophils Absolute: 0 10*3/uL (ref 0.0–0.1)
Basophils Relative: 0 %
Eosinophils Absolute: 0 10*3/uL (ref 0.0–0.5)
Eosinophils Relative: 0 %
HCT: 44.6 % (ref 36.0–46.0)
Hemoglobin: 14.3 g/dL (ref 12.0–15.0)
Immature Granulocytes: 0 %
Lymphocytes Relative: 14 %
Lymphs Abs: 1.2 10*3/uL (ref 0.7–4.0)
MCH: 33.1 pg (ref 26.0–34.0)
MCHC: 32.1 g/dL (ref 30.0–36.0)
MCV: 103.2 fL — ABNORMAL HIGH (ref 80.0–100.0)
Monocytes Absolute: 0.6 10*3/uL (ref 0.1–1.0)
Monocytes Relative: 6 %
Neutro Abs: 7 10*3/uL (ref 1.7–7.7)
Neutrophils Relative %: 80 %
Platelets: 253 10*3/uL (ref 150–400)
RBC: 4.32 MIL/uL (ref 3.87–5.11)
RDW: 13.3 % (ref 11.5–15.5)
WBC: 8.8 10*3/uL (ref 4.0–10.5)
nRBC: 0 % (ref 0.0–0.2)

## 2021-08-02 LAB — TROPONIN I (HIGH SENSITIVITY)
Troponin I (High Sensitivity): 7 ng/L (ref <18)
Troponin I (High Sensitivity): 6 ng/L (ref <18)

## 2021-08-02 LAB — BASIC METABOLIC PANEL
Anion gap: 10 (ref 5–15)
BUN: 21 mg/dL (ref 8–23)
CO2: 25 mmol/L (ref 22–32)
Calcium: 8.6 mg/dL — ABNORMAL LOW (ref 8.9–10.3)
Chloride: 107 mmol/L (ref 98–111)
Creatinine, Ser: 1.29 mg/dL — ABNORMAL HIGH (ref 0.44–1.00)
GFR, Estimated: 44 mL/min — ABNORMAL LOW (ref >60)
Glucose, Bld: 152 mg/dL — ABNORMAL HIGH (ref 70–99)
Potassium: 3.8 mmol/L (ref 3.5–5.1)
Sodium: 142 mmol/L (ref 135–145)

## 2021-08-02 LAB — RESP PANEL BY RT-PCR (FLU A&B, COVID) ARPGX2
Influenza A by PCR: NEGATIVE
Influenza B by PCR: NEGATIVE
SARS Coronavirus 2 by RT PCR: POSITIVE — AB

## 2021-08-02 MED ORDER — NIRMATRELVIR/RITONAVIR (PAXLOVID) TABLET (RENAL DOSING)
2.0000 | ORAL_TABLET | Freq: Two times a day (BID) | ORAL | Status: DC
Start: 2021-08-03 — End: 2021-08-05
  Administered 2021-08-04: 2 via ORAL
  Filled 2021-08-02 (×2): qty 20

## 2021-08-02 MED ORDER — LACTATED RINGERS IV SOLN: INTRAVENOUS | Status: AC

## 2021-08-02 NOTE — H&P (Addendum)
History and Physical    Susan Davidson PFX:[id number] DOB: [birth year] DOA: 08/02/2021  PCP: Caprice Renshaw, MD   Patient coming from: [facility]  I have personally briefly reviewed patient's old medical records in [place]  Chief Complaint: Unresponsiveness  HPI: Susan Davidson is a 70 y.o. female with medical history significant for CKD 3, stroke, hypothyroidism hypertension.  Patient was brought to the ED via EMS report that patient was found unresponsive in the hallway.  When EMS arrived they could not find a pulse or gets a blood pressure reading, patient was hypoxic requiring oxygen.  Patient was given fluids in route and mental status improved.  No record that patient required CPR.  I talked to nurse at nursing home, unable to reach nurse who was present at the time, but per documentation, patient's O2 sats was 87%, heart rate 67, they were unable to get the blood pressure reading and EMS was called.  Also residents are currently infected with COVID, and her positive test today would likely be a new infection.  Of my evaluation patient is awake and alert oriented to person and place.  Spouse is at bedside reports memory issues also.  Per spouse, today he was told earlier in the day, that patient was weak, requiring more assistance.  History from patient is a bit sketchy.  She does not remember passing out, she tells me she had some dizziness and abdominal cramps, but later denies either of these.  She reports recent constipation.  Reports good oral intake.  No vomiting no loose stools.  No cough or difficulty breathing.  No weakness of extremities.  ED Course: Temperature 98.2.  Pulse 91-101.  Respiratory rate 15-17.  Blood pressure systolic 329-924. Orthostatic vitals obtained-and are positive with drop in systolic pressure from 268-341.  COVID test positive. Chest x-ray and head CT without acute abnormality.  Troponin 7 >6.  EKG shows sinus tachycardia rate of 95, with QTC of 492.   PVCs present without significant change from prior. Hospitalist admit for syncope versus unresponsiveness.  Review of Systems: As per HPI all other systems reviewed and negative.  Past Medical History:  Diagnosis Date   ANXIETY 02/12/2008   Qualifier: Diagnosis of  By: Jenny Reichmann MD, Hunt Oris    DEPRESSION 02/12/2008   Qualifier: Diagnosis of  By: Elveria Royals    HYPERLIPIDEMIA 02/12/2008   Qualifier: Diagnosis of  By: Elveria Royals    HYPERTENSION 02/12/2008   Qualifier: Diagnosis of  By: Sherwood, [place] 02/12/2008   Qualifier: Diagnosis of  By: Elveria Royals    Impaired glucose tolerance 08/27/2011   OSTEOPENIA 02/12/2008   Qualifier: Diagnosis of  By: Jenny Reichmann MD, Hunt Oris    VITAMIN D DEFICIENCY 04/14/2010   Qualifier: Diagnosis of  By: Jenny Reichmann MD, Hunt Oris     History reviewed. No pertinent surgical history.   reports that she has been smoking. She has never used smokeless tobacco. She reports current alcohol use. She reports that she does not use drugs.  Allergies  Allergen Reactions   Other Anaphylaxis    Fire ant venom   Epinephrine    Influenza Vac Split Quad     fatigue    Family History  Problem Relation Age of Onset   Heart disease Father    Bipolar disorder Sister    Diabetes Neg Hx     Acceptable: Family history reviewed and not pertinent (If you reviewed it)  Prior to  Admission medications   Medication Sig Start Date End Date Taking? Authorizing Provider  levothyroxine (SYNTHROID) 88 MCG tablet Take 1 tablet (88 mcg total) by mouth daily. 05/27/21  Yes Brita Romp, NP  lisinopril (ZESTRIL) 10 MG tablet Take 1 tablet (10 mg total) by mouth daily. 11/09/20  Yes Biagio Borg, MD  melatonin 5 MG TABS Take 5 mg by mouth.   Yes [provider]  Multiple Vitamin (MULTIVITAMIN) tablet Take 1 tablet by mouth daily.   Yes [provider]  PARoxetine (PAXIL) 20 MG tablet Take 20 mg by mouth daily. 04/18/21   Yes [provider]  rosuvastatin (CRESTOR) 5 MG tablet Take 5 mg by mouth daily. 04/27/21  Yes [provider]  amphetamine-dextroamphetamine (ADDERALL) 20 MG tablet Take 1 tablet (20 mg total) by mouth 2 (two) times daily. 01/30/21   Manuella Ghazi, Pratik D, DO  Cholecalciferol (VITAMIN D3) 50 MCG (2000 UT) TABS Take 1 tablet by mouth daily.    [provider]  clopidogrel (PLAVIX) 75 MG tablet Take 1 tablet (75 mg total) by mouth daily. 01/31/21   Manuella Ghazi, Pratik D, DO  LORazepam (ATIVAN) 1 MG tablet Take 1 mg by mouth 2 (two) times daily.    [provider]  mirtazapine (REMERON) 7.5 MG tablet Take 7.5 mg by mouth at bedtime. 04/29/21   [provider]  risperiDONE (RISPERDAL) 1 MG tablet Take 1 mg by mouth 2 (two) times daily. 10/27/20   [provider]  vitamin B-12 (CYANOCOBALAMIN) 1000 MCG tablet Take 1 tablet (1,000 mcg total) by mouth daily. 03/14/20   Biagio Borg, MD    Physical Exam: Vitals:   08/02/21 1830 08/02/21 1900 08/02/21 1930 08/02/21 2000  BP: 136/76 (!) 142/80 (!) 143/67 129/72  Pulse: 97 98 (!) 101 (!) 101  Resp: 17 15 17 16   Temp:      TempSrc:      SpO2: 100% 100% 100% 99%  Weight:      Height:        Constitutional: NAD, calm, comfortable Vitals:   08/02/21 1830 08/02/21 1900 08/02/21 1930 08/02/21 2000  BP: 136/76 (!) 142/80 (!) 143/67 129/72  Pulse: 97 98 (!) 101 (!) 101  Resp: 17 15 17 16   Temp:      TempSrc:      SpO2: 100% 100% 100% 99%  Weight:      Height:       Eyes: PERRL, lids and conjunctivae normal ENMT: Mucous membranes are moist.   Neck: normal, supple, no masses, no thyromegaly Respiratory: clear to auscultation bilaterally, no wheezing, no crackles. Normal respiratory effort. No accessory muscle use.  Cardiovascular: Regular rate and rhythm, no murmurs / rubs / gallops. 2+ pedal pulses. No carotid bruits.  Abdomen: no tenderness, no masses palpated. No hepatosplenomegaly. Bowel sounds positive.   Musculoskeletal: no clubbing / cyanosis. No joint deformity upper and lower extremities. Good ROM, no contractures. Normal muscle tone.  Skin: no rashes, lesions, ulcers. No induration Neurologic: No facial asymmetry, speech is clear without evidence of aphasia, 5/5 strength right upper and lower extremity, 4+/5 strength in left upper and lower extremity chronic, at baseline.  Sensation intact globally Psychiatric: Speech occasionally confused.  Alert and oriented to person and place.    Labs on Admission: I have personally reviewed following labs and imaging studies  CBC: Recent Labs  Lab 08/02/21 1538  WBC 8.8  NEUTROABS 7.0  HGB 14.3  HCT 44.6  MCV 103.2*  PLT 253  Basic Metabolic Panel: Recent Labs  Lab 08/02/21 1538  NA 142  K 3.8  CL 107  CO2 25  GLUCOSE 152*  BUN 21  CREATININE 1.29*  CALCIUM 8.6*   Urine analysis:    Component Value Date/Time   COLORURINE YELLOW 08/02/2021 2000   APPEARANCEUR HAZY (A) 08/02/2021 2000   LABSPEC 1.020 08/02/2021 2000   PHURINE 5.0 08/02/2021 2000   GLUCOSEU NEGATIVE 08/02/2021 2000   GLUCOSEU NEGATIVE 11/09/2020 [place] 08/02/2021 2000   BILIRUBINUR NEGATIVE 08/02/2021 2000   KETONESUR 20 (A) 08/02/2021 2000   PROTEINUR NEGATIVE 08/02/2021 2000   UROBILINOGEN 0.2 11/09/2020 1717   NITRITE NEGATIVE 08/02/2021 2000   LEUKOCYTESUR MODERATE (A) 08/02/2021 2000    Radiological Exams on Admission: DG Chest 2 View  Result Date: 08/02/2021 CLINICAL DATA:  Syncope with loss of consciousness. EXAM: CHEST - 2 VIEW COMPARISON:  Chest x-ray 05/04/2021. FINDINGS: The heart and mediastinal contours are unchanged. Aortic calcification. No focal consolidation. No pulmonary edema. No pleural effusion. No pneumothorax. No acute osseous abnormality. IMPRESSION: 1. No active cardiopulmonary disease. 2.  Aortic Atherosclerosis (ICD10-I70.0). Electronically Signed   By: Iven Finn M.D.   On: 08/02/2021 18:15   CT Head Wo  Contrast  Result Date: 08/02/2021 CLINICAL DATA:  Unresponsive. EXAM: CT HEAD WITHOUT CONTRAST TECHNIQUE: Contiguous axial images were obtained from the base of the skull through the vertex without intravenous contrast. COMPARISON:  May 04, 2021. FINDINGS: Brain: Old right posterior parietal infarction is noted. No mass effect or midline shift is noted. Ventricular size is within normal limits. There is no evidence of mass lesion, hemorrhage or acute infarction. Vascular: No hyperdense vessel or unexpected calcification. Skull: Normal. Negative for fracture or focal lesion. Sinuses/Orbits: No acute finding. Other: None. IMPRESSION: No acute intracranial abnormality seen. Electronically Signed   By: Marijo Conception M.D.   On: 08/02/2021 18:05    EKG: Independently reviewed.  Sinus tachycardia rate 95, QTc 492.  PVCs present.  No significant change from prior.  Assessment/Plan Principal Problem:   Unresponsiveness Active Problems:   CVA (cerebral vascular accident) ([place])   CKD (chronic kidney disease) stage 3, GFR 30-59 ml/min ([place])   COVID-19 virus infection   Unresponsiveness/Syncope-  Per EMS, could not get a pulse, blood pressure patient was hypoxic.  Here in the ED, patient's vitals are stable, she is on room air and appear.  Her orthostatic vitals are positive with systolic dropping from 283 to 129. She is positive for Covid but this would not explain why "she didn't have a pulse or blood pressure reading".  CT is unremarkable. - EKG, Trops x 2 unremarkable - 1 L given en route, continue  L/R 100cc/hr x 15hrs - Recent Echo- 01/2021-limited due to poor acoustic windows, EF 65 to 70%. Normal LV function. Mild LVH. Left ventricular diastolic parameters are indeterminate.  - monitor on tele -Hold Xanax  COVID-19 infection- positive today, but she was admitted 8/2 and was positive on that admission and treated with monoclonal antibody.  No respiratory symptoms today. chest x-ray is clear, she  is currently on room air.  Generalized weakness today.  COVID-19 infection at nursing home.  Staff is unsure how many doses patient has gotten but feels patient has gotten at least 2 doses of the vaccine. -Pharmacy consult for paxlovid -Obtain and trend inflammatory markers  CKD 3 B- Cr 1.29. Stable.  CVA- with mild residual left-sided deficits.  Head CT without acute changes.  HTN-  -  Hold lisinopril for now with reported initial vitals by EMS  Depression-  -Per nursing home, patient is on Depakote to 250 mg 3 times daily hold for now,  check levels -Hold Xanax 0.5 mg 3 times daily and as needed dose -Med reconciliation pending  -Dyslipidemia  -Crestor  DVT prophylaxis: Lovenox Code Status: Full code Family Communication:  Spouse at bedside Disposition Plan:  ~ 1 -2 days, pending  Consults called: None Admission status: Obs tele    Bethena Roys MD [facility]  08/02/2021, 11:48 PM

## 2021-08-02 NOTE — ED Triage Notes (Signed)
Pt to the ED from [place] with [facility]. Pt was found unresponsive in the hallway.  When EMS arrived they did not find pulses and could not get a blood pressure.  Pt is Alert now and has received a bolus of fluid in route.

## 2021-08-02 NOTE — Telephone Encounter (Signed)
Called to schedule consult, no answer, left vm. AW  

## 2021-08-02 NOTE — ED Provider Notes (Signed)
[name] EMERGENCY DEPARTMENT Provider Note   CSN: [id number] Arrival date & time: 08/02/21  1526     History Chief Complaint  Patient presents with   Hypotension    Susan Davidson is a 70 y.o. female who presents from [facility] after having a hypotensive episode in which she became pale, gray, unresponsive after physical therapy. Patient became hypoxic, required oxygen, EMS was unable to find pulses or get a blood pressure. Patient received a fluid bolus in route and became responsive. Patient with baseline dementia, normally AO to self, day, but not place. Has history of recent strokes, and nurse at her facility reports that her doctor is worried she may be having TIAs sporadically. Nurse at home confirms patient did not have a fall at any time. Patient at this time has no complaints.  HPI     Past Medical History:  Diagnosis Date   ANXIETY 02/12/2008   Qualifier: Diagnosis of  By: Jenny Reichmann MD, Hunt Oris    DEPRESSION 02/12/2008   Qualifier: Diagnosis of  By: Elveria Royals    HYPERLIPIDEMIA 02/12/2008   Qualifier: Diagnosis of  By: Elveria Royals    HYPERTENSION 02/12/2008   Qualifier: Diagnosis of  By: Sherwood, [place] 02/12/2008   Qualifier: Diagnosis of  By: Elveria Royals    Impaired glucose tolerance 08/27/2011   OSTEOPENIA 02/12/2008   Qualifier: Diagnosis of  By: Jenny Reichmann MD, Hunt Oris    VITAMIN D DEFICIENCY 04/14/2010   Qualifier: Diagnosis of  By: Jenny Reichmann MD, Hunt Oris     Patient Active Problem List   Diagnosis Date Noted   Unresponsiveness 83/15/1761   Acute metabolic encephalopathy 60/73/7106   CKD (chronic kidney disease) stage 3, GFR 30-59 ml/min ([place]) 05/04/2021   COVID-19 virus infection 05/04/2021   Lower urinary tract infectious disease    Generalized weakness 01/27/2021   Spinal stenosis at L4-L5 level 01/27/2021   CVA (cerebral vascular accident) ([place]) 01/27/2021   Ambulatory dysfunction 01/26/2021    Tobacco use disorder 01/26/2021   CKD (chronic kidney disease) 11/15/2020   B12 deficiency 11/09/2020   Urinary frequency 11/02/2018   Daytime somnolence 10/19/2012   Impaired glucose tolerance 08/27/2011   Preventative health care 08/27/2011   Vitamin D deficiency 04/14/2010   HYPERSOMNIA 02/19/2009   Hypothyroidism 02/12/2008   HLD (hyperlipidemia) 02/12/2008   ANXIETY 02/12/2008   Depression 02/12/2008   Essential hypertension 02/12/2008   OSTEOPENIA 02/12/2008    History reviewed. No pertinent surgical history.   OB History   No obstetric history on file.     Family History  Problem Relation Age of Onset   Heart disease Father    Bipolar disorder Sister    Diabetes Neg Hx     Social History   Tobacco Use   Smoking status: Every Day   Smokeless tobacco: Never  Vaping Use   Vaping Use: Never used  Substance Use Topics   Alcohol use: Yes   Drug use: No    Home Medications Prior to Admission medications   Medication Sig Start Date End Date Taking? Authorizing Provider  levothyroxine (SYNTHROID) 88 MCG tablet Take 1 tablet (88 mcg total) by mouth daily. 05/27/21  Yes Brita Romp, NP  lisinopril (ZESTRIL) 10 MG tablet Take 1 tablet (10 mg total) by mouth daily. 11/09/20  Yes Biagio Borg, MD  melatonin 5 MG TABS Take 5 mg by mouth.   Yes [provider]  Multiple Vitamin (MULTIVITAMIN)  tablet Take 1 tablet by mouth daily.   Yes [provider]  PARoxetine (PAXIL) 20 MG tablet Take 20 mg by mouth daily. 04/18/21  Yes [provider]  rosuvastatin (CRESTOR) 5 MG tablet Take 5 mg by mouth daily. 04/27/21  Yes [provider]  amphetamine-dextroamphetamine (ADDERALL) 20 MG tablet Take 1 tablet (20 mg total) by mouth 2 (two) times daily. 01/30/21   Manuella Ghazi, Pratik D, DO  Cholecalciferol (VITAMIN D3) 50 MCG (2000 UT) TABS Take 1 tablet by mouth daily.    [provider]  clopidogrel (PLAVIX) 75 MG tablet Take 1 tablet (75 mg  total) by mouth daily. 01/31/21   Manuella Ghazi, Pratik D, DO  LORazepam (ATIVAN) 1 MG tablet Take 1 mg by mouth 2 (two) times daily.    [provider]  mirtazapine (REMERON) 7.5 MG tablet Take 7.5 mg by mouth at bedtime. 04/29/21   [provider]  risperiDONE (RISPERDAL) 1 MG tablet Take 1 mg by mouth 2 (two) times daily. 10/27/20   [provider]  vitamin B-12 (CYANOCOBALAMIN) 1000 MCG tablet Take 1 tablet (1,000 mcg total) by mouth daily. 03/14/20   Biagio Borg, MD    Allergies    Other, Epinephrine, and Influenza vac split quad  Review of Systems   Review of Systems  Reason unable to perform ROS: dementia.  Constitutional:  Positive for activity change.  Respiratory:  Negative for chest tightness and shortness of breath.   Cardiovascular:  Negative for chest pain and palpitations.  Neurological:  Positive for light-headedness.  All other systems reviewed and are negative.  Physical Exam Updated Vital Signs BP 129/72   Pulse (!) 101   Temp 98.2 F (36.8 C) (Oral)   Resp 16   Ht 5\' 2"  (1.575 m)   Wt 60.3 kg   SpO2 99%   BMI 24.33 kg/m   Physical Exam Vitals and nursing note reviewed.  Constitutional:      General: She is not in acute distress.    Appearance: Normal appearance.     Comments: Pale appearing older female in no acute distress  HENT:     Head: Normocephalic and atraumatic.  Eyes:     General:        Right eye: No discharge.        Left eye: No discharge.  Cardiovascular:     Rate and Rhythm: Normal rate and regular rhythm.     Pulses: Normal pulses.     Heart sounds: No murmur heard.   No friction rub. No gallop.  Pulmonary:     Effort: Pulmonary effort is normal.     Breath sounds: Normal breath sounds.     Comments: No respiratory distress without nasal cannula Abdominal:     General: Bowel sounds are normal.     Palpations: Abdomen is soft.  Skin:    General: Skin is warm and dry.     Capillary Refill: Capillary refill takes  less than 2 seconds.     Comments: Full examination does not reveal any injury or evidence of a fall  Neurological:     Mental Status: She is alert and oriented to person, place, and time.  Psychiatric:        Mood and Affect: Mood normal.        Behavior: Behavior normal.    ED Results / Procedures / Treatments   Labs (all labs ordered are listed, but only abnormal results are displayed) Labs Reviewed  CBC WITH DIFFERENTIAL/PLATELET -  Abnormal; Notable for the following components:      Result Value   MCV 103.2 (*)    All other components within normal limits  BASIC METABOLIC PANEL - Abnormal; Notable for the following components:   Glucose, Bld 152 (*)    Creatinine, Ser 1.29 (*)    Calcium 8.6 (*)    GFR, Estimated 44 (*)    All other components within normal limits  RESP PANEL BY RT-PCR (FLU A&B, COVID) [id number]  URINALYSIS, ROUTINE W REFLEX MICROSCOPIC  TROPONIN I (HIGH SENSITIVITY)  TROPONIN I (HIGH SENSITIVITY)    EKG None  Radiology DG Chest 2 View  Result Date: 08/02/2021 CLINICAL DATA:  Syncope with loss of consciousness. EXAM: CHEST - 2 VIEW COMPARISON:  Chest x-ray 05/04/2021. FINDINGS: The heart and mediastinal contours are unchanged. Aortic calcification. No focal consolidation. No pulmonary edema. No pleural effusion. No pneumothorax. No acute osseous abnormality. IMPRESSION: 1. No active cardiopulmonary disease. 2.  Aortic Atherosclerosis (ICD10-I70.0). Electronically Signed   By: Iven Finn M.D.   On: 08/02/2021 18:15   CT Head Wo Contrast  Result Date: 08/02/2021 CLINICAL DATA:  Unresponsive. EXAM: CT HEAD WITHOUT CONTRAST TECHNIQUE: Contiguous axial images were obtained from the base of the skull through the vertex without intravenous contrast. COMPARISON:  May 04, 2021. FINDINGS: Brain: Old right posterior parietal infarction is noted. No mass effect or midline shift is noted. Ventricular size is within normal limits. There is no evidence of mass  lesion, hemorrhage or acute infarction. Vascular: No hyperdense vessel or unexpected calcification. Skull: Normal. Negative for fracture or focal lesion. Sinuses/Orbits: No acute finding. Other: None. IMPRESSION: No acute intracranial abnormality seen. Electronically Signed   By: Marijo Conception M.D.   On: 08/02/2021 18:05    Procedures Procedures   Medications Ordered in ED Medications - No data to display  ED Course  I have reviewed the triage vital signs and the nursing notes.  Pertinent labs & imaging results that were available during my care of the patient were reviewed by me and considered in my medical decision making (see chart for details).    MDM Rules/Calculators/A&P                         I discussed this case with my attending physician who cosigned this note including patient's presenting symptoms, physical exam, and planned diagnostics and interventions. Attending physician stated agreement with plan or made changes to plan which were implemented.   Attending physician assessed patient at bedside.  Overall well-appearing, but disoriented female laying on bed in no acute distress.  She is quite pale, somewhat clammy.  Patient does have intact pulses throughout.  Patient can respond to some questions, however exhibits some confusion.  She is oriented to herself.  Patient appears markedly improved both in her lab work, and her general appearance after receiving fluid bolus in route via EMS.  Patient does have some evidence of orthostatic hypotension with acceleration of pulse by 11 bpm, and decrease in pressure systolically of nearly 30 mmHg from lying to sitting.  Patient appears overall stable at this time, no longer requiring oxygen however she did experience a pulseless episode with oxygen desaturation, extreme hypotension, no blood pressure earlier today.  Given high risk shock like versus cardiac episode, do believe patient needs to be monitored overnight for reassurance.   No evidence of fall, no history of fall confirmed at patient's care facility today.  CT head does not  reveal acute stroke.  Patient does have some evidence of mild kidney injury, creatinine elevated from last baseline.  Spoke with hospitalist about admission hospitalist agrees to admission at this time.  Final Clinical Impression(s) / ED Diagnoses Final diagnoses:  None    Rx / DC Orders ED Discharge Orders     None        Dorien Chihuahua 08/02/21 2107    Milton Ferguson, MD 08/06/21 [phone]

## 2021-08-02 NOTE — ED Notes (Signed)
Orthostatic vitals obtained except standing Pt states stays in bed other than therapy, 2-3 to stand and walk

## 2021-08-03 ENCOUNTER — Other Ambulatory Visit (HOSPITAL_COMMUNITY): Payer: Medicare HMO

## 2021-08-03 ENCOUNTER — Observation Stay (HOSPITAL_COMMUNITY): Payer: Medicare HMO

## 2021-08-03 DIAGNOSIS — U071 COVID-19: Secondary | ICD-10-CM | POA: Diagnosis not present

## 2021-08-03 DIAGNOSIS — I493 Ventricular premature depolarization: Secondary | ICD-10-CM | POA: Diagnosis not present

## 2021-08-03 DIAGNOSIS — N1832 Chronic kidney disease, stage 3b: Secondary | ICD-10-CM

## 2021-08-03 DIAGNOSIS — J9811 Atelectasis: Secondary | ICD-10-CM | POA: Diagnosis not present

## 2021-08-03 DIAGNOSIS — R4189 Other symptoms and signs involving cognitive functions and awareness: Secondary | ICD-10-CM | POA: Diagnosis not present

## 2021-08-03 LAB — CBC WITH DIFFERENTIAL/PLATELET
Abs Immature Granulocytes: 0.04 10*3/uL (ref 0.00–0.07)
Basophils Absolute: 0 10*3/uL (ref 0.0–0.1)
Basophils Relative: 0 %
Eosinophils Absolute: 0 10*3/uL (ref 0.0–0.5)
Eosinophils Relative: 0 %
HCT: 46.1 % — ABNORMAL HIGH (ref 36.0–46.0)
Hemoglobin: 14.8 g/dL (ref 12.0–15.0)
Immature Granulocytes: 0 %
Lymphocytes Relative: 16 %
Lymphs Abs: 1.8 10*3/uL (ref 0.7–4.0)
MCH: 32.5 pg (ref 26.0–34.0)
MCHC: 32.1 g/dL (ref 30.0–36.0)
MCV: 101.3 fL — ABNORMAL HIGH (ref 80.0–100.0)
Monocytes Absolute: 0.8 10*3/uL (ref 0.1–1.0)
Monocytes Relative: 7 %
Neutro Abs: 8.9 10*3/uL — ABNORMAL HIGH (ref 1.7–7.7)
Neutrophils Relative %: 77 %
Platelets: 254 10*3/uL (ref 150–400)
RBC: 4.55 MIL/uL (ref 3.87–5.11)
RDW: 13.6 % (ref 11.5–15.5)
WBC: 11.6 10*3/uL — ABNORMAL HIGH (ref 4.0–10.5)
nRBC: 0 % (ref 0.0–0.2)

## 2021-08-03 LAB — COMPREHENSIVE METABOLIC PANEL
ALT: 11 U/L (ref 0–44)
AST: 14 U/L — ABNORMAL LOW (ref 15–41)
Albumin: 3.6 g/dL (ref 3.5–5.0)
Alkaline Phosphatase: 44 U/L (ref 38–126)
Anion gap: 10 (ref 5–15)
BUN: 23 mg/dL (ref 8–23)
CO2: 24 mmol/L (ref 22–32)
Calcium: 9 mg/dL (ref 8.9–10.3)
Chloride: 107 mmol/L (ref 98–111)
Creatinine, Ser: 0.95 mg/dL (ref 0.44–1.00)
GFR, Estimated: >60 mL/min (ref >60)
Glucose, Bld: 134 mg/dL — ABNORMAL HIGH (ref 70–99)
Potassium: 4 mmol/L (ref 3.5–5.1)
Sodium: 141 mmol/L (ref 135–145)
Total Bilirubin: 0.6 mg/dL (ref 0.3–1.2)
Total Protein: 6.6 g/dL (ref 6.5–8.1)

## 2021-08-03 LAB — LACTATE DEHYDROGENASE: LDH: 121 U/L (ref 98–192)

## 2021-08-03 LAB — TSH: TSH: 1.402 u[IU]/mL (ref 0.350–4.500)

## 2021-08-03 LAB — FERRITIN
Ferritin: 89 ng/mL (ref 11–307)
Ferritin: 98 ng/mL (ref 11–307)

## 2021-08-03 LAB — PROCALCITONIN: Procalcitonin: <0.1 ng/mL

## 2021-08-03 LAB — C-REACTIVE PROTEIN
CRP: 0.5 mg/dL (ref <1.0)
CRP: 0.6 mg/dL (ref <1.0)

## 2021-08-03 LAB — D-DIMER, QUANTITATIVE
D-Dimer, Quant: 6.13 ug/mL-FEU — ABNORMAL HIGH (ref 0.00–0.50)
D-Dimer, Quant: 1.08 ug/mL-FEU — ABNORMAL HIGH (ref 0.00–0.50)

## 2021-08-03 LAB — PHOSPHORUS: Phosphorus: 3.4 mg/dL (ref 2.5–4.6)

## 2021-08-03 LAB — MAGNESIUM: Magnesium: 2.3 mg/dL (ref 1.7–2.4)

## 2021-08-03 LAB — VALPROIC ACID LEVEL: Valproic Acid Lvl: 52 ug/mL (ref 50.0–100.0)

## 2021-08-03 MED ORDER — RISPERIDONE 1 MG PO TABS
1.0000 mg | ORAL_TABLET | Freq: Two times a day (BID) | ORAL | Status: DC
  Administered 2021-08-03 – 2021-08-04 (×2): 1 mg via ORAL
  Filled 2021-08-03 (×2): qty 1

## 2021-08-03 MED ORDER — ASCORBIC ACID 500 MG PO TABS
500.0000 mg | ORAL_TABLET | Freq: Every day | ORAL | Status: DC
  Administered 2021-08-03 – 2021-08-04 (×2): 500 mg via ORAL
  Filled 2021-08-03 (×2): qty 1

## 2021-08-03 MED ORDER — MEMANTINE HCL 10 MG PO TABS
5.0000 mg | ORAL_TABLET | Freq: Two times a day (BID) | ORAL | Status: DC
  Administered 2021-08-03 – 2021-08-04 (×2): 5 mg via ORAL
  Filled 2021-08-03 (×2): qty 1

## 2021-08-03 MED ORDER — LEVOTHYROXINE SODIUM 88 MCG PO TABS
88.0000 ug | ORAL_TABLET | Freq: Every day | ORAL | Status: DC
  Administered 2021-08-03 – 2021-08-04 (×2): 88 ug via ORAL
  Filled 2021-08-03 (×2): qty 1

## 2021-08-03 MED ORDER — ENOXAPARIN SODIUM 40 MG/0.4ML IJ SOSY
40.0000 mg | PREFILLED_SYRINGE | INTRAMUSCULAR | Status: DC
  Administered 2021-08-03 – 2021-08-04 (×2): 40 mg via SUBCUTANEOUS
  Filled 2021-08-03 (×3): qty 0.4

## 2021-08-03 MED ORDER — CLOPIDOGREL BISULFATE 75 MG PO TABS
75.0000 mg | ORAL_TABLET | Freq: Every day | ORAL | Status: DC
  Administered 2021-08-03 – 2021-08-04 (×2): 75 mg via ORAL
  Filled 2021-08-03 (×2): qty 1

## 2021-08-03 MED ORDER — ACETAMINOPHEN 325 MG PO TABS: 650.0000 mg | ORAL_TABLET | Freq: Four times a day (QID) | ORAL | Status: DC | PRN

## 2021-08-03 MED ORDER — VALBENAZINE TOSYLATE 40 MG PO CAPS
80.0000 mg | ORAL_CAPSULE | Freq: Every day | ORAL | Status: DC
  Filled 2021-08-03 (×2): qty 2

## 2021-08-03 MED ORDER — PAROXETINE HCL 20 MG PO TABS
20.0000 mg | ORAL_TABLET | Freq: Every day | ORAL | Status: DC
  Administered 2021-08-03 – 2021-08-04 (×2): 20 mg via ORAL
  Filled 2021-08-03 (×2): qty 1

## 2021-08-03 MED ORDER — POLYETHYLENE GLYCOL 3350 17 G PO PACK: 17.0000 g | PACK | Freq: Every day | ORAL | Status: DC | PRN

## 2021-08-03 MED ORDER — ATROPINE SULFATE 1 MG/10ML IJ SOSY: 1.0000 mg | PREFILLED_SYRINGE | Freq: Once | INTRAMUSCULAR | Status: DC

## 2021-08-03 MED ORDER — ZINC SULFATE 220 (50 ZN) MG PO CAPS
220.0000 mg | ORAL_CAPSULE | Freq: Every day | ORAL | Status: DC
  Administered 2021-08-03 – 2021-08-04 (×2): 220 mg via ORAL
  Filled 2021-08-03 (×2): qty 1

## 2021-08-03 MED ORDER — IOHEXOL 350 MG/ML SOLN
75.0000 mL | Freq: Once | INTRAVENOUS | Status: AC | PRN
  Administered 2021-08-03: 75 mL via INTRAVENOUS

## 2021-08-03 MED ORDER — ROSUVASTATIN CALCIUM 10 MG PO TABS
5.0000 mg | ORAL_TABLET | Freq: Every day | ORAL | Status: DC
  Administered 2021-08-03 – 2021-08-04 (×2): 5 mg via ORAL
  Filled 2021-08-03 (×2): qty 1

## 2021-08-03 NOTE — Progress Notes (Addendum)
Patient became bradycardic down to 25 bpm for 3 minutes. Patient was awake, but hypotensive. 1mg  atropine given ordered, but improved before given.

## 2021-08-03 NOTE — Consult Note (Addendum)
Cardiology Consultation:   Patient ID: NYKERRIA MACCONNELL MRN: [id number]; DOB: 1950/12/09  Admit date: 08/02/2021 Date of Consult: 08/03/2021  PCP:  Caprice Renshaw, MD   [facility] Cardiologist:  New to [facility]   Patient Profile:   Susan Davidson is a 70 y.o. female with a hx of CVA who is being seen 08/03/2021 for the evaluation of reported bradycardia and episode of unconsciousness at the request of Dr. Roderic Palau.  History of Present Illness:   Susan Davidson is a 70 yo female with history of CKD3, CVA, HTN, hypothyroid, residing at [facility].   Patient reportedly was found unresponsive in hallway of SNF. No CPR done and EMS arrived couldn't find a pulse or BP reading. Hypoxic and given O2 HR 67. BP low given IV fluids. [id number] positive-asymptomatic. She's a poor historian and doesn't recall events. She had some nausea. She denies dizziness, chest pain, dyspnea, palpitations. Reviewed case in detail with Dr. Roderic Palau and reviewed all telemetry strips. No significant bradycardia or asystole. She was having frequent PVC's, bigeminy and some NSVT. Says she's been a heavy smoker, quit but started smoking again.EKG and troponins unremarkable. Echo 01/2021 EF 65-70% mild LVH  Past Medical History:  Diagnosis Date   ANXIETY 02/12/2008   Qualifier: Diagnosis of  By: Jenny Reichmann MD, Hunt Oris    DEPRESSION 02/12/2008   Qualifier: Diagnosis of  By: Elveria Royals    HYPERLIPIDEMIA 02/12/2008   Qualifier: Diagnosis of  By: Elveria Royals    HYPERTENSION 02/12/2008   Qualifier: Diagnosis of  By: Sherwood, [place] 02/12/2008   Qualifier: Diagnosis of  By: Elveria Royals    Impaired glucose tolerance 08/27/2011   OSTEOPENIA 02/12/2008   Qualifier: Diagnosis of  By: Jenny Reichmann MD, Hunt Oris    VITAMIN D DEFICIENCY 04/14/2010   Qualifier: Diagnosis of  By: Jenny Reichmann MD, Hunt Oris     History reviewed. No pertinent surgical history.   Home Medications:  Prior to Admission  medications   Medication Sig Start Date End Date Taking? Authorizing Provider  ALPRAZolam Duanne Moron) 0.5 MG tablet Take 0.5 mg by mouth in the morning, at noon, and at bedtime. 07/26/21  Yes [provider]  aspirin EC 81 MG tablet Take 81 mg by mouth daily. Swallow whole.   Yes [provider]  clopidogrel (PLAVIX) 75 MG tablet Take 1 tablet (75 mg total) by mouth daily. 01/31/21  Yes Shah, Pratik D, DO  divalproex (DEPAKOTE) 250 MG DR tablet Take 1 tablet by mouth in the morning, at noon, and at bedtime. 07/28/21  Yes [provider]  levothyroxine (SYNTHROID) 88 MCG tablet Take 1 tablet (88 mcg total) by mouth daily. 05/27/21  Yes Brita Romp, NP  lisinopril (ZESTRIL) 10 MG tablet Take 1 tablet (10 mg total) by mouth daily. 11/09/20  Yes Biagio Borg, MD  melatonin 5 MG TABS Take 5 mg by mouth.   Yes [provider]  memantine (NAMENDA) 5 MG tablet Take 5 mg by mouth 2 (two) times daily.   Yes [provider]  Multiple Vitamin (MULTIVITAMIN) tablet Take 1 tablet by mouth daily.   Yes [provider]  PARoxetine (PAXIL) 20 MG tablet Take 20 mg by mouth daily. 04/18/21  Yes [provider]  rosuvastatin (CRESTOR) 5 MG tablet Take 5 mg by mouth daily. 04/27/21  Yes [provider]  valbenazine (INGREZZA) 80 MG capsule Take 80 mg by mouth at bedtime.   Yes  [provider]  vitamin B-12 (CYANOCOBALAMIN) 1000 MCG tablet Take 1 tablet (1,000 mcg total) by mouth daily. 03/14/20  Yes Biagio Borg, MD  amphetamine-dextroamphetamine (ADDERALL) 20 MG tablet Take 1 tablet (20 mg total) by mouth 2 (two) times daily. Patient not taking: Reported on 08/03/2021 01/30/21   Heath Lark D, DO  LORazepam (ATIVAN) 1 MG tablet Take 1 mg by mouth 2 (two) times daily. Patient not taking: Reported on 08/03/2021    [provider]  mirtazapine (REMERON) 7.5 MG tablet Take 7.5 mg by mouth at bedtime. Patient not taking: Reported on  08/03/2021 04/29/21   [provider]  risperiDONE (RISPERDAL) 1 MG tablet Take 1 mg by mouth 2 (two) times daily. Patient not taking: Reported on 08/03/2021 10/27/20   [provider]    Inpatient Medications: Scheduled Meds:  vitamin C  500 mg Oral Daily   clopidogrel  75 mg Oral Daily   enoxaparin (LOVENOX) injection  40 mg Subcutaneous Q24H   levothyroxine  88 mcg Oral Daily   nirmatrelvir/ritonavir EUA (renal dosing)  2 tablet Oral BID   PARoxetine  20 mg Oral Daily   rosuvastatin  5 mg Oral Daily   zinc sulfate  220 mg Oral Daily   Continuous Infusions:  lactated ringers 100 mL/hr at 08/03/21 0047   PRN Meds: acetaminophen, polyethylene glycol  Allergies:    Allergies  Allergen Reactions   Other Anaphylaxis    Fire ant venom   Epinephrine    Influenza Vac Split Quad     fatigue    Social History:   Social History   Tobacco Use   Smoking status: Every Day   Smokeless tobacco: Never  Substance Use Topics   Alcohol use: Yes     Family History:     Family History  Problem Relation Age of Onset   Heart disease Father    Bipolar disorder Sister    Diabetes Neg Hx      ROS:  Please see the history of present illness.  Review of Systems  Reason unable to perform ROS: unreliable history, CVA.   All other ROS reviewed and negative.     Physical Exam/Data:   Vitals:   08/03/21 0450 08/03/21 0530 08/03/21 0600 08/03/21 0900  BP: (!) 154/78 (!) 145/65 (!) 97/43 (!) 111/49  Pulse: 92 95 91 78  Resp: 14 18 (!) 22 15  Temp:      TempSrc:      SpO2: 100% 100% 100% 99%  Weight:      Height:       No intake or output data in the 24 hours ending 08/03/21 1100 Last 3 Weights 08/02/2021 07/27/2021 05/27/2021  Weight (lbs) 133 lb 133 lb 133 lb  Weight (kg) 60.328 kg 60.328 kg 60.328 kg     Body mass index is 24.33 kg/m.  General:Thin, in no acute distress  HEENT: normal Neck: no JVD Vascular: No carotid bruits; Distal pulses 2+  bilaterally Cardiac:  normal S1, S2; RRR; no murmur   Lungs:  clear to auscultation bilaterally, no wheezing, rhonchi or rales  Abd: soft, nontender, no hepatomegaly  Ext: no edema Musculoskeletal:  No deformities, BUE and BLE strength normal and equal Skin: warm and dry  Neuro:  CNs 2-12 intact, no focal abnormalities noted Psych:  Normal affect   EKG:  The EKG was personally reviewed and demonstrates:   NSR low voltage, freq PVC's Telemetry:  Telemetry was personally reviewed and demonstrates:  all tele  reviewed-NSR with PVC's, NSVT, no pauses  Relevant CV Studies: Echo 01/2021 IMPRESSIONS    1. Images are very limited dues to poor acoustic windows.   2. Left ventricular ejection fraction, by estimation, is 65 to 70%. The  left ventricle has normal function. Left ventricular endocardial border  not optimally defined to evaluate regional wall motion. There is mild left  ventricular hypertrophy. Left  ventricular diastolic parameters are indeterminate.   3. Right ventricular systolic function is normal. The right ventricular  size is normal.   4. The mitral valve is grossly normal. Trivial mitral valve  regurgitation.   5. The aortic valve was not well visualized. Aortic valve regurgitation  is not visualized.   6. Unable to estimate CVP.   Laboratory Data:  High Sensitivity Troponin:   Recent Labs  Lab 08/02/21 1538 08/02/21 1920  TROPONINIHS 7 6     Chemistry Recent Labs  Lab 08/02/21 1538 08/03/21 0450  NA 142 141  K 3.8 4.0  CL 107 107  CO2 25 24  GLUCOSE 152* 134*  BUN 21 23  CREATININE 1.29* 0.95  CALCIUM 8.6* 9.0  MG  --  2.3  GFRNONAA 44* >60  ANIONGAP 10 10    Recent Labs  Lab 08/03/21 0450  PROT 6.6  ALBUMIN 3.6  AST 14*  ALT 11  ALKPHOS 44  BILITOT 0.6    Hematology Recent Labs  Lab 08/02/21 1538 08/03/21 0450  WBC 8.8 11.6*  RBC 4.32 4.55  HGB 14.3 14.8  HCT 44.6 46.1*  MCV 103.2* 101.3*  MCH 33.1 32.5  MCHC 32.1 32.1  RDW  13.3 13.6  PLT 253 254    DDimer  Recent Labs  Lab 08/02/21 1538 08/03/21 0450  DDIMER 6.13* 1.08*    Radiology/Studies:  DG Chest 2 View  Result Date: 08/02/2021 CLINICAL DATA:  Syncope with loss of consciousness. EXAM: CHEST - 2 VIEW COMPARISON:  Chest x-ray 05/04/2021. FINDINGS: The heart and mediastinal contours are unchanged. Aortic calcification. No focal consolidation. No pulmonary edema. No pleural effusion. No pneumothorax. No acute osseous abnormality. IMPRESSION: 1. No active cardiopulmonary disease. 2.  Aortic Atherosclerosis (ICD10-I70.0). Electronically Signed   By: Iven Finn M.D.   On: 08/02/2021 18:15   CT Head Wo Contrast  Result Date: 08/02/2021 CLINICAL DATA:  Unresponsive. EXAM: CT HEAD WITHOUT CONTRAST TECHNIQUE: Contiguous axial images were obtained from the base of the skull through the vertex without intravenous contrast. COMPARISON:  May 04, 2021. FINDINGS: Brain: Old right posterior parietal infarction is noted. No mass effect or midline shift is noted. Ventricular size is within normal limits. There is no evidence of mass lesion, hemorrhage or acute infarction. Vascular: No hyperdense vessel or unexpected calcification. Skull: Normal. Negative for fracture or focal lesion. Sinuses/Orbits: No acute finding. Other: None. IMPRESSION: No acute intracranial abnormality seen. Electronically Signed   By: Marijo Conception M.D.   On: 08/02/2021 18:05   CT Angio Chest Pulmonary Embolism (PE) W or WO Contrast  Result Date: 08/03/2021 CLINICAL DATA:  70 year old female with positive COVID-19. Hypotension. Abnormal D-dimer. EXAM: CT ANGIOGRAPHY CHEST WITH CONTRAST TECHNIQUE: Multidetector CT imaging of the chest was performed using the standard protocol during bolus administration of intravenous contrast. Multiplanar CT image reconstructions and MIPs were obtained to evaluate the vascular anatomy. CONTRAST:  28mL OMNIPAQUE IOHEXOL 350 MG/ML SOLN COMPARISON:  Chest  radiographs 08/02/2021.  CTA neck 01/27/2021. FINDINGS: Cardiovascular: Good contrast bolus timing in the pulmonary arterial tree. Respiratory motion at the lung bases.  No focal filling defect identified in the pulmonary arteries to suggest acute pulmonary embolism. Calcified coronary artery atherosclerosis on series 5, image 167. No cardiomegaly or pericardial effusion. Mild to moderate aortic atherosclerosis. Mediastinum/Nodes: Negative.  No lymphadenopathy. Lungs/Pleura: Elevated right hemidiaphragm with confluent and enhancing right lower lobe atelectasis. Platelike opacity in the left lower lobe also more resembles atelectasis than infection. The major airways are patent although there is globular retained secretions along the right lateral wall of the trachea at the thoracic inlet on series 6, image 13. No pleural effusion. No consolidation. Upper Abdomen: Negative visible liver, gallbladder, spleen, pancreas, adrenal glands, and bowel in the upper abdomen. Partially visible kidneys demonstrate symmetric renal enhancement but also moderate nonspecific pararenal space stranding (series 4, image 90). Musculoskeletal: Chronic or congenital mid sternal deformity on series 6, image 65. Osteopenia. Chronic T5 superior endplate Schmorl's node. Partially visible chronic left clavicle deformity. No acute osseous abnormality identified. Review of the MIP images confirms the above findings. IMPRESSION: 1. Negative for acute pulmonary embolus. 2. Elevated right hemidiaphragm with right lower lobe atelectasis. And plate-like opacity in the left lower lobe also more resembles atelectasis than infection. There are retained secretions in the trachea. No pleural effusion. 3. Partially visible pararenal space stranding is nonspecific. Query acute renal insufficiency. 4. Aortic Atherosclerosis (ICD10-I70.0). Electronically Signed   By: Genevie Ann M.D.   On: 08/03/2021 10:07     Assessment and Plan:   Unresponsiveness with  question of pulseless and hypotensive responded to IV fluids. No CPR done and no bradycardia here. Some NSVT/PVC'sShe's not reliable but denies dizziness.Will monitor, repeat echo and have her wear a ZIO at discharge.  [id number] positive asymptomatic  History of CVA on Plavix and statin  HTN-lisinopril on hold  HLD on statin  Risk Assessment/Risk Scores:       For questions or updates, please contact [facility] Please consult [email or web address] for contact info under    Signed, Ermalinda Barrios, PA-C 08/03/2021 11:00 AM   Attending note:  Patient seen under isolation for COVID-19 by Ms. Vita Barley, we discussed the case and I agree with her above findings.  She presents from SNF after reported episode of unresponsiveness and pulselessness, although it does not sound like CPR was required and she ultimately improved with IV fluids per EMS and supplemental oxygen.  Was reportedly found in the hall by staff.  We are consulted due to reported episode of prolonged bradycardia (heart rate in the 20s for 3 minutes?).  Telemetry reviewed extensively without any clear evidence of bradycardia.  She does have episodic frequent PVCs, some NSVT.  Otherwise in sinus rhythm.  No pauses noted.  She is afebrile.  Heart rate on telemetry in the 70s to 80s in sinus rhythm.  Systolic blood pressure 37T to 150s.  Pertinent lab work includes potassium 4.0, BUN 23, creatinine 0.95, high-sensitivity troponin I levels normal, hemoglobin 14.8, platelets 254, D-dimer 1.08, SARS coronavirus 2 test positive.  Chest CTA negative for acute pulmonary embolus.  Right hemidiaphragm elevated with right lower lobe atelectasis.  Secretions also noted in the trachea.  I personally reviewed her ECG which shows sinus rhythm with low voltage and frequent PVCs (upright lead III).  Episode of unresponsiveness, details not entirely clear.  No definitive prolonged episode of bradycardia per review of telemetry.  Does have episodic  frequent PVCs and some NSVT.  Would plan to observe on telemetry and obtain an echocardiogram to reevaluate cardiac structure and function.  LVEF normal as of  April.  Satira Sark, M.D., F.A.C.C.

## 2021-08-03 NOTE — Progress Notes (Signed)
[facility] PROGRESS NOTE    Susan Davidson  AJG:[id number] DOB: 08/31/1951 DOA: 08/02/2021 PCP: Caprice Renshaw, MD    Brief Narrative:  70 year old female with a history of hypertension, hypothyroidism, prior stroke, who is currently at a skilled nursing facility, was brought to the hospital when she was found unresponsive.  EMS was called.  It is unclear whether she was truly hypotensive/pulseless, but she was brought to the ER for evaluation.  Upon arrival here, she was noted to have low blood pressure which responded to IV fluids.  While monitoring in the emergency room, there was concern that she may be having episodes of bradycardia.  Review of strips indicate that she was having PVCs, but no significant bradycardia was noted.  She was incidentally noted to be COVID-positive.  She was admitted for further observation and cardiology was consulted.   Assessment & Plan:   Principal Problem:   Unresponsiveness Active Problems:   CVA (cerebral vascular accident) ([place])   CKD (chronic kidney disease) stage 3, GFR 30-59 ml/min ([place])   COVID-19 virus infection   Syncope/unresponsive episode -Details around this event are not entirely clear -Per record, she was reportedly found to be unresponsive in the hallway.  EMS was called, they could not locate a pulse or obtain a blood pressure.  It is unclear why CPR was not initiated, but patient was brought to the hospital and received IV fluids.  On arrival, she was noted to have low blood pressure and was hypoxic requiring oxygen.  Blood pressure improved after IV fluids as stated mental status. -?  If patient was orthostatic and became hypotensive leading to syncope -Her D-dimer was elevated on admission, but CT chest was negative for PE -Echocardiogram has been ordered  Bradycardia -Overnight notes indicate that patient had sustained bradycardia, which appeared to resolve on its own -Review of telemetry strips do not show any pauses or significant  bradycardia -Strips do show low voltage QRS complexes with frequent PVCs -I suspect that PVCs may be thought of as QRS complexes and low voltage QRS complexes were not read by machine -Appreciate cardiology assistance -Continue monitoring for now -May benefit from wearing a monitor after discharge since she is having PVCs  COVID-19 infection -Does not appear to be significantly short of breath -Appears to have had a positive test on 8/2 with monoclonal antibody at that time -Current chest x-ray appears to be clear -Inflammatory markers are not significantly elevated -She is currently on room air -Started on Paxlovid  Hypertension -Holding lisinopril since she was hypotensive on arrival  Hyperlipidemia -Continue statin  Hypothyroidism -TSH normal -Continue on Synthroid  History of cryptogenic stroke -Continue on Plavix   DVT prophylaxis: enoxaparin (LOVENOX) injection 40 mg Start: 08/03/21 1000  Code Status: Full code Family Communication: Tried calling patient's husband, unable to get through to him Disposition Plan: Status is: Observation  The patient remains OBS appropriate and will d/c before 2 midnights.        Consultants:  Cardiology  Procedures:    Antimicrobials:      Subjective: Patient is sleeping on my arrival, she does wake up to voice.  She knows she is in the hospital.  Says she was brought to the hospital because of nausea.  Does not have any complaints at this time.  Objective: Vitals:   08/03/21 0600 08/03/21 0900 08/03/21 1200 08/03/21 1400  BP: (!) 97/43 (!) 111/49 133/70 134/64  Pulse: 91 78 80 82  Resp: (!) 22 15 19  15  Temp:      TempSrc:      SpO2: 100% 99% 98% 100%  Weight:      Height:       No intake or output data in the 24 hours ending 08/03/21 1410 Filed Weights   08/02/21 1531  Weight: 60.3 kg    Examination:  General exam: Appears calm and comfortable  Respiratory system: Clear to auscultation. Respiratory  effort normal. Cardiovascular system: S1 & S2 heard, RRR. No JVD, murmurs, rubs, gallops or clicks. No pedal edema. Gastrointestinal system: Abdomen is nondistended, soft and nontender. No organomegaly or masses felt. Normal bowel sounds heard. Central nervous system: Alert and oriented. No focal neurological deficits. Extremities: Symmetric 5 x 5 power. Skin: No rashes, lesions or ulcers Psychiatry: Judgement and insight appear normal. Mood & affect appropriate.     Data Reviewed: I have personally reviewed following labs and imaging studies  CBC: Recent Labs  Lab 08/02/21 1538 08/03/21 0450  WBC 8.8 11.6*  NEUTROABS 7.0 8.9*  HGB 14.3 14.8  HCT 44.6 46.1*  MCV 103.2* 101.3*  PLT 253 867   Basic Metabolic Panel: Recent Labs  Lab 08/02/21 1538 08/03/21 0450  NA 142 141  K 3.8 4.0  CL 107 107  CO2 25 24  GLUCOSE 152* 134*  BUN 21 23  CREATININE 1.29* 0.95  CALCIUM 8.6* 9.0  MG  --  2.3  PHOS  --  3.4   GFR: Estimated Creatinine Clearance: 45.8 mL/min (by C-G formula based on SCr of 0.95 mg/dL). Liver Function Tests: Recent Labs  Lab 08/03/21 0450  AST 14*  ALT 11  ALKPHOS 44  BILITOT 0.6  PROT 6.6  ALBUMIN 3.6   No results for input(s): LIPASE, AMYLASE in the last 168 hours. No results for input(s): AMMONIA in the last 168 hours. Coagulation Profile: No results for input(s): INR, PROTIME in the last 168 hours. Cardiac Enzymes: No results for input(s): CKTOTAL, CKMB, CKMBINDEX, TROPONINI in the last 168 hours. BNP (last 3 results) No results for input(s): PROBNP in the last 8760 hours. HbA1C: No results for input(s): HGBA1C in the last 72 hours. CBG: No results for input(s): GLUCAP in the last 168 hours. Lipid Profile: No results for input(s): CHOL, HDL, LDLCALC, TRIG, CHOLHDL, LDLDIRECT in the last 72 hours. Thyroid Function Tests: Recent Labs    08/03/21 0450  TSH 1.402   Anemia Panel: Recent Labs    08/02/21 1538 08/03/21 0450  FERRITIN  89 98   Sepsis Labs: Recent Labs  Lab 08/02/21 1538  PROCALCITON <0.10    Recent Results (from the past 240 hour(s))  Resp Panel by RT-PCR (Flu A&B, Covid) Nasopharyngeal Swab     Status: Abnormal   Collection Time: 08/02/21  8:01 PM   Specimen: Nasopharyngeal Swab; Nasopharyngeal(NP) swabs in vial transport medium  Result Value Ref Range Status   SARS Coronavirus 2 by RT PCR POSITIVE (A) NEGATIVE Final    Comment: CRITICAL RESULT CALLED TO, READ BACK BY AND VERIFIED WITH: MACEY BRAME RN,[id number],08/02/2021,SELF S (NOTE) SARS-CoV-2 target nucleic acids are DETECTED.  The SARS-CoV-2 RNA is generally detectable in upper respiratory specimens during the acute phase of infection. Positive results are indicative of the presence of the identified virus, but do not rule out bacterial infection or co-infection with other pathogens not detected by the test. Clinical correlation with patient history and other diagnostic information is necessary to determine patient infection status. The expected result is Negative.  Fact Sheet for Patients: EntrepreneurPulse.com.au  Fact  Sheet for Healthcare Providers: IncredibleEmployment.be  This test is not yet approved or cleared by the [place] and  has been authorized for detection and/or diagnosis of SARS-CoV-2 by FDA under an Emergency Use Authorization (EUA).  This EUA will remain in effect (meaning thi s test can be used) for the duration of  the COVID-19 declaration under Section 564(b)(1) of the Act, 21 U.S.C. section 360bbb-3(b)(1), unless the authorization is terminated or revoked sooner.     Influenza A by PCR NEGATIVE NEGATIVE Final   Influenza B by PCR NEGATIVE NEGATIVE Final    Comment: (NOTE) The Xpert Xpress SARS-CoV-2/FLU/RSV plus assay is intended as an aid in the diagnosis of influenza from Nasopharyngeal swab specimens and should not be used as a sole basis for treatment. Nasal  washings and aspirates are unacceptable for Xpert Xpress SARS-CoV-2/FLU/RSV testing.  Fact Sheet for Patients: EntrepreneurPulse.com.au  Fact Sheet for Healthcare Providers: IncredibleEmployment.be  This test is not yet approved or cleared by the [place] FDA and has been authorized for detection and/or diagnosis of SARS-CoV-2 by FDA under an Emergency Use Authorization (EUA). This EUA will remain in effect (meaning this test can be used) for the duration of the COVID-19 declaration under Section 564(b)(1) of the Act, 21 U.S.C. section 360bbb-3(b)(1), unless the authorization is terminated or revoked.  Performed at [facility], [address], [place], [place] [address]          Radiology Studies: DG Chest 2 View  Result Date: 08/02/2021 CLINICAL DATA:  Syncope with loss of consciousness. EXAM: CHEST - 2 VIEW COMPARISON:  Chest x-ray 05/04/2021. FINDINGS: The heart and mediastinal contours are unchanged. Aortic calcification. No focal consolidation. No pulmonary edema. No pleural effusion. No pneumothorax. No acute osseous abnormality. IMPRESSION: 1. No active cardiopulmonary disease. 2.  Aortic Atherosclerosis (ICD10-I70.0). Electronically Signed   By: Iven Finn M.D.   On: 08/02/2021 18:15   CT Head Wo Contrast  Result Date: 08/02/2021 CLINICAL DATA:  Unresponsive. EXAM: CT HEAD WITHOUT CONTRAST TECHNIQUE: Contiguous axial images were obtained from the base of the skull through the vertex without intravenous contrast. COMPARISON:  May 04, 2021. FINDINGS: Brain: Old right posterior parietal infarction is noted. No mass effect or midline shift is noted. Ventricular size is within normal limits. There is no evidence of mass lesion, hemorrhage or acute infarction. Vascular: No hyperdense vessel or unexpected calcification. Skull: Normal. Negative for fracture or focal lesion. Sinuses/Orbits: No acute finding. Other: None. IMPRESSION: No  acute intracranial abnormality seen. Electronically Signed   By: Marijo Conception M.D.   On: 08/02/2021 18:05   CT Angio Chest Pulmonary Embolism (PE) W or WO Contrast  Result Date: 08/03/2021 CLINICAL DATA:  70 year old female with positive COVID-19. Hypotension. Abnormal D-dimer. EXAM: CT ANGIOGRAPHY CHEST WITH CONTRAST TECHNIQUE: Multidetector CT imaging of the chest was performed using the standard protocol during bolus administration of intravenous contrast. Multiplanar CT image reconstructions and MIPs were obtained to evaluate the vascular anatomy. CONTRAST:  33mL OMNIPAQUE IOHEXOL 350 MG/ML SOLN COMPARISON:  Chest radiographs 08/02/2021.  CTA neck 01/27/2021. FINDINGS: Cardiovascular: Good contrast bolus timing in the pulmonary arterial tree. Respiratory motion at the lung bases. No focal filling defect identified in the pulmonary arteries to suggest acute pulmonary embolism. Calcified coronary artery atherosclerosis on series 5, image 167. No cardiomegaly or pericardial effusion. Mild to moderate aortic atherosclerosis. Mediastinum/Nodes: Negative.  No lymphadenopathy. Lungs/Pleura: Elevated right hemidiaphragm with confluent and enhancing right lower lobe atelectasis. Platelike opacity in the left lower  lobe also more resembles atelectasis than infection. The major airways are patent although there is globular retained secretions along the right lateral wall of the trachea at the thoracic inlet on series 6, image 13. No pleural effusion. No consolidation. Upper Abdomen: Negative visible liver, gallbladder, spleen, pancreas, adrenal glands, and bowel in the upper abdomen. Partially visible kidneys demonstrate symmetric renal enhancement but also moderate nonspecific pararenal space stranding (series 4, image 90). Musculoskeletal: Chronic or congenital mid sternal deformity on series 6, image 65. Osteopenia. Chronic T5 superior endplate Schmorl's node. Partially visible chronic left clavicle deformity. No  acute osseous abnormality identified. Review of the MIP images confirms the above findings. IMPRESSION: 1. Negative for acute pulmonary embolus. 2. Elevated right hemidiaphragm with right lower lobe atelectasis. And plate-like opacity in the left lower lobe also more resembles atelectasis than infection. There are retained secretions in the trachea. No pleural effusion. 3. Partially visible pararenal space stranding is nonspecific. Query acute renal insufficiency. 4. Aortic Atherosclerosis (ICD10-I70.0). Electronically Signed   By: Genevie Ann M.D.   On: 08/03/2021 10:07        Scheduled Meds:  vitamin C  500 mg Oral Daily   clopidogrel  75 mg Oral Daily   enoxaparin (LOVENOX) injection  40 mg Subcutaneous Q24H   levothyroxine  88 mcg Oral Daily   nirmatrelvir/ritonavir EUA (renal dosing)  2 tablet Oral BID   PARoxetine  20 mg Oral Daily   rosuvastatin  5 mg Oral Daily   zinc sulfate  220 mg Oral Daily   Continuous Infusions:  lactated ringers 100 mL/hr at 08/03/21 1101     LOS: 0 days    Time spent: 81mins    Kathie Dike, MD [facility]   If 7PM-7AM, please contact night-coverage [email or web address]  08/03/2021, 2:10 PM

## 2021-08-04 ENCOUNTER — Observation Stay (HOSPITAL_BASED_OUTPATIENT_CLINIC_OR_DEPARTMENT_OTHER): Payer: Medicare HMO

## 2021-08-04 DIAGNOSIS — R9431 Abnormal electrocardiogram [ECG] [EKG]: Secondary | ICD-10-CM

## 2021-08-04 DIAGNOSIS — R4189 Other symptoms and signs involving cognitive functions and awareness: Principal | ICD-10-CM

## 2021-08-04 LAB — C-REACTIVE PROTEIN: CRP: 0.9 mg/dL (ref <1.0)

## 2021-08-04 LAB — CBC WITH DIFFERENTIAL/PLATELET
Abs Immature Granulocytes: 0.02 10*3/uL (ref 0.00–0.07)
Basophils Absolute: 0.1 10*3/uL (ref 0.0–0.1)
Basophils Relative: 1 %
Eosinophils Absolute: 0.2 10*3/uL (ref 0.0–0.5)
Eosinophils Relative: 2 %
HCT: 37.8 % (ref 36.0–46.0)
Hemoglobin: 12.4 g/dL (ref 12.0–15.0)
Immature Granulocytes: 0 %
Lymphocytes Relative: 27 %
Lymphs Abs: 1.9 10*3/uL (ref 0.7–4.0)
MCH: 32.9 pg (ref 26.0–34.0)
MCHC: 32.8 g/dL (ref 30.0–36.0)
MCV: 100.3 fL — ABNORMAL HIGH (ref 80.0–100.0)
Monocytes Absolute: 0.7 10*3/uL (ref 0.1–1.0)
Monocytes Relative: 9 %
Neutro Abs: 4.4 10*3/uL (ref 1.7–7.7)
Neutrophils Relative %: 61 %
Platelets: 225 10*3/uL (ref 150–400)
RBC: 3.77 MIL/uL — ABNORMAL LOW (ref 3.87–5.11)
RDW: 13.6 % (ref 11.5–15.5)
WBC: 7.2 10*3/uL (ref 4.0–10.5)
nRBC: 0 % (ref 0.0–0.2)

## 2021-08-04 LAB — ECHOCARDIOGRAM COMPLETE
AR max vel: 2.62 cm2
AV Area VTI: 2.45 cm2
AV Area mean vel: 2.31 cm2
AV Mean grad: 3 mmHg
AV Peak grad: 5.8 mmHg
Ao pk vel: 1.2 m/s
Area-P 1/2: 2.75 cm2
Height: 62 in
MV VTI: 3.45 cm2
S' Lateral: 1.9 cm
Weight: 2127.99 oz

## 2021-08-04 LAB — MAGNESIUM: Magnesium: 2 mg/dL (ref 1.7–2.4)

## 2021-08-04 LAB — D-DIMER, QUANTITATIVE: D-Dimer, Quant: 1.09 ug/mL-FEU — ABNORMAL HIGH (ref 0.00–0.50)

## 2021-08-04 LAB — COMPREHENSIVE METABOLIC PANEL
ALT: 10 U/L (ref 0–44)
AST: 10 U/L — ABNORMAL LOW (ref 15–41)
Albumin: 3.1 g/dL — ABNORMAL LOW (ref 3.5–5.0)
Alkaline Phosphatase: 40 U/L (ref 38–126)
Anion gap: 6 (ref 5–15)
BUN: 22 mg/dL (ref 8–23)
CO2: 28 mmol/L (ref 22–32)
Calcium: 8.7 mg/dL — ABNORMAL LOW (ref 8.9–10.3)
Chloride: 109 mmol/L (ref 98–111)
Creatinine, Ser: 0.93 mg/dL (ref 0.44–1.00)
GFR, Estimated: >60 mL/min (ref >60)
Glucose, Bld: 94 mg/dL (ref 70–99)
Potassium: 4.2 mmol/L (ref 3.5–5.1)
Sodium: 143 mmol/L (ref 135–145)
Total Bilirubin: 0.6 mg/dL (ref 0.3–1.2)
Total Protein: 5.9 g/dL — ABNORMAL LOW (ref 6.5–8.1)

## 2021-08-04 LAB — FERRITIN: Ferritin: 90 ng/mL (ref 11–307)

## 2021-08-04 LAB — PHOSPHORUS: Phosphorus: 3.7 mg/dL (ref 2.5–4.6)

## 2021-08-04 MED ORDER — ROSUVASTATIN CALCIUM 10 MG PO TABS: 5.0000 mg | ORAL_TABLET | Freq: Every day | ORAL | Status: DC

## 2021-08-04 MED ORDER — METOPROLOL TARTRATE 25 MG PO TABS: 12.5000 mg | ORAL_TABLET | Freq: Two times a day (BID) | ORAL | 3 refills | Status: DC

## 2021-08-04 MED ORDER — ROSUVASTATIN CALCIUM 5 MG PO TABS: 5.0000 mg | ORAL_TABLET | Freq: Every day | ORAL | 4 refills | Status: DC

## 2021-08-04 MED ORDER — ACETAMINOPHEN 325 MG PO TABS: 650.0000 mg | ORAL_TABLET | Freq: Four times a day (QID) | ORAL | 2 refills | Status: AC | PRN

## 2021-08-04 MED ORDER — ZINC SULFATE 220 (50 ZN) MG PO CAPS: 220.0000 mg | ORAL_CAPSULE | Freq: Every day | ORAL | 0 refills | Status: DC

## 2021-08-04 MED ORDER — ALPRAZOLAM 0.5 MG PO TABS: 0.5000 mg | ORAL_TABLET | Freq: Three times a day (TID) | ORAL | 0 refills | Status: DC

## 2021-08-04 MED ORDER — ASPIRIN EC 81 MG PO TBEC: 81.0000 mg | DELAYED_RELEASE_TABLET | Freq: Every day | ORAL | 11 refills | Status: DC

## 2021-08-04 MED ORDER — ASCORBIC ACID 500 MG PO TABS: 500.0000 mg | ORAL_TABLET | Freq: Every day | ORAL | 2 refills | Status: AC

## 2021-08-04 MED ORDER — NIRMATRELVIR/RITONAVIR (PAXLOVID) TABLET (RENAL DOSING): 2.0000 | ORAL_TABLET | Freq: Two times a day (BID) | ORAL | 0 refills | Status: AC

## 2021-08-04 MED ORDER — POLYETHYLENE GLYCOL 3350 17 G PO PACK: 17.0000 g | PACK | Freq: Every day | ORAL | 0 refills | Status: DC | PRN

## 2021-08-04 NOTE — NC FL2 (Signed)
Standard LEVEL OF CARE SCREENING TOOL     IDENTIFICATION  Patient Name: Susan Davidson Birthdate: [birth year] Sex: female Admission Date (Current Location): 08/02/2021  [place] and [place] Number:  [place] [facility] [id number] [place] and Address:  [place] [address], [place]      Provider Number: [id number]  Attending Physician Name and Address:  Roxan Hockey, MD  Relative Name and Phone Number:  SURY, WENTWORTH (Spouse)   [phone]    Current Level of Care: Hospital Recommended Level of Care: [place] Prior Approval Number:    Date Approved/Denied:   PASRR Number:    Discharge Plan: SNF    Current Diagnoses: Patient Active Problem List   Diagnosis Date Noted   Unresponsiveness 18/29/9371   Acute metabolic encephalopathy 69/67/8938   CKD (chronic kidney disease) stage 3, GFR 30-59 ml/min ([place]) 05/04/2021   COVID-19 virus infection 05/04/2021   Lower urinary tract infectious disease    Generalized weakness 01/27/2021   Spinal stenosis at L4-L5 level 01/27/2021   CVA (cerebral vascular accident) ([place]) 01/27/2021   Ambulatory dysfunction 01/26/2021   Tobacco use disorder 01/26/2021   CKD (chronic kidney disease) 11/15/2020   B12 deficiency 11/09/2020   Urinary frequency 11/02/2018   Daytime somnolence 10/19/2012   Impaired glucose tolerance 08/27/2011   Preventative health care 08/27/2011   Vitamin D deficiency 04/14/2010   HYPERSOMNIA 02/19/2009   Hypothyroidism 02/12/2008   HLD (hyperlipidemia) 02/12/2008   ANXIETY 02/12/2008   Depression 02/12/2008   Essential hypertension 02/12/2008   OSTEOPENIA 02/12/2008    Orientation RESPIRATION BLADDER Height & Weight     Self, Time, Situation, Place  Normal Incontinent Weight: 133 lb (60.3 kg) Height:  5\' 2"  (157.5 cm)  BEHAVIORAL SYMPTOMS/MOOD NEUROLOGICAL BOWEL NUTRITION STATUS      Continent Diet (low sodium heart healthy)  AMBULATORY STATUS  COMMUNICATION OF NEEDS Skin   Extensive Assist Verbally Normal                       Personal Care Assistance Level of Assistance  Bathing, Feeding, Dressing Bathing Assistance: Maximum assistance Feeding assistance: Independent Dressing Assistance: Maximum assistance     Functional Limitations Info  Sight, Hearing, Speech Sight Info: Adequate Hearing Info: Adequate Speech Info: Adequate    SPECIAL CARE FACTORS FREQUENCY                       Contractures Contractures Info: Not present    Additional Factors Info  Code Status, Allergies, Psychotropic, Isolation Precautions Code Status Info: Full Code Allergies Info: Fire ant venom, epinephrine, ifluenza vat split quad Psychotropic Info: ativan, risperdal, remeron   Isolation Precautions Info: COVID+ on 08/02/21     Current Medications (08/04/2021):  This is the current hospital active medication list Current Facility-Administered Medications  Medication Dose Route Frequency Provider Last Rate Last Admin   acetaminophen (TYLENOL) tablet 650 mg  650 mg Oral Q6H PRN Emokpae, Ejiroghene E, MD       ascorbic acid (VITAMIN C) tablet 500 mg  500 mg Oral Daily Emokpae, Ejiroghene E, MD   500 mg at 08/04/21 0848   clopidogrel (PLAVIX) tablet 75 mg  75 mg Oral Daily Emokpae, Ejiroghene E, MD   75 mg at 08/04/21 0848   enoxaparin (LOVENOX) injection 40 mg  40 mg Subcutaneous Q24H Emokpae, Ejiroghene E, MD   40 mg at 08/04/21 0849   levothyroxine (SYNTHROID) tablet 88 mcg  88 mcg Oral  Daily Emokpae, Ejiroghene E, MD   88 mcg at 08/04/21 0600   memantine (NAMENDA) tablet 5 mg  5 mg Oral BID Kathie Dike, MD   5 mg at 08/04/21 0847   nirmatrelvir/ritonavir EUA (renal dosing) (PAXLOVID) 2 tablet  2 tablet Oral BID Emokpae, Ejiroghene E, MD   2 tablet at 08/04/21 1119   PARoxetine (PAXIL) tablet 20 mg  20 mg Oral Daily Emokpae, Ejiroghene E, MD   20 mg at 08/04/21 0847   polyethylene glycol (MIRALAX / GLYCOLAX) packet 17 g  17  g Oral Daily PRN Emokpae, Ejiroghene E, MD       risperiDONE (RISPERDAL) tablet 1 mg  1 mg Oral BID Kathie Dike, MD   1 mg at 08/04/21 0847   [START ON 08/08/2021] rosuvastatin (CRESTOR) tablet 5 mg  5 mg Oral Daily Emokpae, Courage, MD       valbenazine (INGREZZA) capsule 80 mg  80 mg Oral QHS Memon, Jolaine Artist, MD       zinc sulfate capsule 220 mg  220 mg Oral Daily Emokpae, Ejiroghene E, MD   220 mg at 08/04/21 0847     Discharge Medications: Please see discharge summary for a list of discharge medications.  Relevant Imaging Results:  Relevant Lab Results:   Additional Information SSN: [id number] Clydene Pugh, LCSW

## 2021-08-04 NOTE — Discharge Instructions (Signed)
1)Hold Crestor while taking Paxlovid 2)Start Metoprolol 12.5 mg twice daily-Hold if SBP <  100 mmhg or HR < 60 BPM

## 2021-08-04 NOTE — Progress Notes (Addendum)
   Progress Note  Patient Name: Susan Davidson Date of Encounter: 08/04/2021  Primary Cardiologist: New to [facility]  Please see cardiology consultation note from yesterday.  I personally reviewed her telemetry.  She has been in sinus rhythm with no significant bradycardia or pauses.  She has had intermittent PVCs, no NSVT.  Echocardiogram shows vigorous LVEF at 70 to 75%, mildly sclerotic aortic valve without stenosis, and trivial pericardial effusion.  Orthostatic measurements yesterday equivocal.  Lisinopril held given relatively low blood pressure initially.  Also continues on treatment for COVID-19 per primary team.  At this point no clear arrhythmogenic cause for her reported unresponsive event, may have been related to low blood pressure.  The PVCs are not clearly symptomatic, also unifocal and upright when captured in lead III suggesting outflow tract etiology and a more benign course particularly with normal LVEF.  Unless true bradycardia, pauses, or recurrent NSVT noted while monitored in the hospital, do not think that an outpatient monitor would be of much benefit at this point.  Continue symptomatic treatment for COVID-19.  We will sign off for now.  Signed, Rozann Lesches, MD  08/04/2021, 9:32 AM

## 2021-08-04 NOTE — Progress Notes (Signed)
Called and gave report to [place] at receiving facility [facility]).

## 2021-08-04 NOTE — Discharge Summary (Signed)
Susan Davidson, is a 70 y.o. female  DOB 1950-10-31  MRN [id number].  Admission date:  08/02/2021  Admitting Physician  Bethena Roys, MD  Discharge Date:  08/04/2021   Primary MD  Caprice Renshaw, MD  Recommendations for primary care physician for things to follow:   1)Hold Crestor while taking Paxlovid 2)Start Metoprolol 12.5 mg twice daily-Hold if SBP <  100 mmhg or HR < 60 BPM  Admission Diagnosis  Unresponsiveness [R41.89] COVID-19 virus infection [U07.1]   Discharge Diagnosis  Unresponsiveness [R41.89] COVID-19 virus infection [U07.1]    Principal Problem:   Unresponsiveness Active Problems:   CVA (cerebral vascular accident) ([place])   CKD (chronic kidney disease) stage 3, GFR 30-59 ml/min ([place])   COVID-19 virus infection      Past Medical History:  Diagnosis Date   ANXIETY 02/12/2008   Qualifier: Diagnosis of  By: Jenny Reichmann MD, Hunt Oris    DEPRESSION 02/12/2008   Qualifier: Diagnosis of  By: Sherwood, Diggins 02/12/2008   Qualifier: Diagnosis of  By: Elveria Royals    HYPERTENSION 02/12/2008   Qualifier: Diagnosis of  By: Sherwood, V[place]leave 02/12/2008   Qualifier: Diagnosis of  By: Elveria Royals    Impaired glucose tolerance 08/27/2011   OSTEOPENIA 02/12/2008   Qualifier: Diagnosis of  By: Jenny Reichmann MD, Hunt Oris    VITAMIN D DEFICIENCY 04/14/2010   Qualifier: Diagnosis of  By: Jenny Reichmann MD, Hunt Oris     History reviewed. No pertinent surgical history.     HPI  from the history and physical done on the day of admission:   Chief Complaint: Unresponsiveness   HPI: Susan Davidson is a 70 y.o. female with medical history significant for CKD 3, stroke, hypothyroidism hypertension.  Patient was brought to the ED via EMS report that patient was found unresponsive in the hallway.  When EMS arrived they could not find a pulse or gets a  blood pressure reading, patient was hypoxic requiring oxygen.  Patient was given fluids in route and mental status improved.  No record that patient required CPR.   I talked to nurse at nursing home, unable to reach nurse who was present at the time, but per documentation, patient's O2 sats was 87%, heart rate 67, they were unable to get the blood pressure reading and EMS was called.  Also residents are currently infected with COVID, and her positive test today would likely be a new infection.   Of my evaluation patient is awake and alert oriented to person and place.  Spouse is at bedside reports memory issues also.  Per spouse, today he was told earlier in the day, that patient was weak, requiring more assistance.  History from patient is a bit sketchy.  She does not remember passing out, she tells me she had some dizziness and abdominal cramps, but later denies either of these.  She reports recent constipation.  Reports good oral intake.  No vomiting no loose stools.  No cough or difficulty breathing.  No weakness of extremities.   ED Course: Temperature 98.2.  Pulse 91-101.  Respiratory rate 15-17.  Blood pressure systolic 956-213. Orthostatic vitals obtained-and are positive with drop in systolic pressure from 086-578.  COVID test positive. Chest x-ray and head CT without acute abnormality.  Troponin 7 >6.  EKG shows sinus tachycardia rate of 95, with QTC of 492.  PVCs present without significant change from prior. Hospitalist admit for syncope versus unresponsiveness.      Hospital Course:    A/p 1)Unresponsive Episode/Syncope----- review of telemetry reveals sinus rhythm with no significant bradycardia or pauses.  -EF on echo 70 to 35% without significant aortic stenosis -No SVT however intermittent PVCs noted -Cardiology sees little utility in placing outpatient heart monitoring device at this time -Patient unresponsive episode may have been driven by hypotension or arrhythmias -BP  improved with discontinuation of lisinopril -Okay to use metoprolol 12.5 mg twice daily with Hold Parameters given frequent PVCs -Please see final cardiology note dated 08/04/2021.  2)Covid-19 infection--largely asymptomatic, -Initially tested positive for COVID on 05/04/21, received monoclonal antibody at that time - tested positive again on 08/03/2021 -Chest x-ray without acute findings, inflammatory markers not significantly elevated -Okay to complete paxlovid, hold Crestor while taking Paxlovid -Continue zinc and vitamin C  3)HTN-BP has been soft, improved overall, -Lisinopril discontinued --Okay to use metoprolol 12.5 mg twice daily with Hold Parameters given frequent PVCs  4)H/o CVA--- chronic residual left hemiparesis , continue Plavix, hold Crestor until after completing Paxlovid  5)Dementia without behavioral disturbance--- stable, no significant behavioral disturbance  --continue risperidone, Paxil and Namenda  6)Hypothyroidism--continue levothyroxine, TSH is 1.4  Discharge Condition: Stable  Follow UP   Contact information for after-discharge care     [facility] .   Service: Skilled Nursing Contact information: [address] [place] [phone]                      Consults obtained - Cardiology  Diet and Activity recommendation:  As advised  Discharge Instructions    Discharge Instructions     Call MD for:  difficulty breathing, headache or visual disturbances   Complete by: As directed    Call MD for:  persistant dizziness or light-headedness   Complete by: As directed    Call MD for:  persistant nausea and vomiting   Complete by: As directed    Call MD for:  severe uncontrolled pain   Complete by: As directed    Call MD for:  temperature >100.4   Complete by: As directed    Diet - low sodium heart healthy   Complete by: As directed    Discharge instructions    Complete by: As directed    1)Hold Crestor while taking Paxlovid 2)Start Metoprolol 12.5 mg twice daily-Hold if SBP <  100 mmhg or HR < 60 BPM   Increase activity slowly   Complete by: As directed          Discharge Medications     Allergies as of 08/04/2021       Reactions   Other Anaphylaxis   Fire ant venom   Epinephrine    Influenza Vac Split Quad    fatigue        Medication List     STOP taking these medications    amphetamine-dextroamphetamine 20 MG tablet Commonly known as: ADDERALL   lisinopril 10 MG tablet  Commonly known as: ZESTRIL   LORazepam 1 MG tablet Commonly known as: ATIVAN   mirtazapine 7.5 MG tablet Commonly known as: REMERON   risperiDONE 1 MG tablet Commonly known as: RISPERDAL       TAKE these medications    acetaminophen 325 MG tablet Commonly known as: TYLENOL Take 2 tablets (650 mg total) by mouth every 6 (six) hours as needed for mild pain or headache (fever >/= 101).   ALPRAZolam 0.5 MG tablet Commonly known as: XANAX Take 1 tablet (0.5 mg total) by mouth in the morning, at noon, and at bedtime.   ascorbic acid 500 MG tablet Commonly known as: VITAMIN C Take 1 tablet (500 mg total) by mouth daily. Start taking on: August 05, 2021   aspirin EC 81 MG tablet Take 1 tablet (81 mg total) by mouth daily with breakfast. Swallow whole. What changed: when to take this   clopidogrel 75 MG tablet Commonly known as: PLAVIX Take 1 tablet (75 mg total) by mouth daily.   divalproex 250 MG DR tablet Commonly known as: DEPAKOTE Take 1 tablet by mouth in the morning, at noon, and at bedtime.   levothyroxine 88 MCG tablet Commonly known as: SYNTHROID Take 1 tablet (88 mcg total) by mouth daily.   melatonin 5 MG Tabs Take 5 mg by mouth.   memantine 5 MG tablet Commonly known as: NAMENDA Take 5 mg by mouth 2 (two) times daily.   metoprolol tartrate 25 MG tablet Commonly known as: LOPRESSOR Take 0.5 tablets (12.5 mg total)  by mouth 2 (two) times daily. -Hold if SBP <  100 mmhg or HR < 60 BPM   multivitamin tablet Take 1 tablet by mouth daily.   nirmatrelvir/ritonavir EUA (renal dosing) 10 x 150 MG & 10 x 100MG  Tabs Commonly known as: PAXLOVID Take 2 tablets by mouth 2 (two) times daily for 5 days. Take nirmatrelvir (150 mg) one tablet twice daily for 5 days and ritonavir (100 mg) one tablet twice daily for 5 days.   PARoxetine 20 MG tablet Commonly known as: PAXIL Take 20 mg by mouth daily.   polyethylene glycol 17 g packet Commonly known as: MIRALAX / GLYCOLAX Take 17 g by mouth daily as needed for mild constipation.   rosuvastatin 5 MG tablet Commonly known as: CRESTOR Take 1 tablet (5 mg total) by mouth daily. Hold Crestor while taking Paxlovid Start taking on: August 10, 2021 What changed:  additional instructions These instructions start on August 10, 2021. If you are unsure what to do until then, ask your doctor or other care provider.   valbenazine 80 MG capsule Commonly known as: INGREZZA Take 80 mg by mouth at bedtime.   vitamin B-12 1000 MCG tablet Commonly known as: CYANOCOBALAMIN Take 1 tablet (1,000 mcg total) by mouth daily.   zinc sulfate 220 (50 Zn) MG capsule Take 1 capsule (220 mg total) by mouth daily. Start taking on: August 05, 2021        Major procedures and Radiology Reports - PLEASE review detailed and final reports for all details, in brief -  DG Chest 2 View  Result Date: 08/02/2021 CLINICAL DATA:  Syncope with loss of consciousness. EXAM: CHEST - 2 VIEW COMPARISON:  Chest x-ray 05/04/2021. FINDINGS: The heart and mediastinal contours are unchanged. Aortic calcification. No focal consolidation. No pulmonary edema. No pleural effusion. No pneumothorax. No acute osseous abnormality. IMPRESSION: 1. No active cardiopulmonary disease. 2.  Aortic Atherosclerosis (ICD10-I70.0). Electronically Signed   By: Thomasena Edis  Mckinley Jewel M.D.   On: 08/02/2021 18:15   CT Head Wo  Contrast  Result Date: 08/02/2021 CLINICAL DATA:  Unresponsive. EXAM: CT HEAD WITHOUT CONTRAST TECHNIQUE: Contiguous axial images were obtained from the base of the skull through the vertex without intravenous contrast. COMPARISON:  May 04, 2021. FINDINGS: Brain: Old right posterior parietal infarction is noted. No mass effect or midline shift is noted. Ventricular size is within normal limits. There is no evidence of mass lesion, hemorrhage or acute infarction. Vascular: No hyperdense vessel or unexpected calcification. Skull: Normal. Negative for fracture or focal lesion. Sinuses/Orbits: No acute finding. Other: None. IMPRESSION: No acute intracranial abnormality seen. Electronically Signed   By: Marijo Conception M.D.   On: 08/02/2021 18:05   CT Angio Chest Pulmonary Embolism (PE) W or WO Contrast  Result Date: 08/03/2021 CLINICAL DATA:  70 year old female with positive COVID-19. Hypotension. Abnormal D-dimer. EXAM: CT ANGIOGRAPHY CHEST WITH CONTRAST TECHNIQUE: Multidetector CT imaging of the chest was performed using the standard protocol during bolus administration of intravenous contrast. Multiplanar CT image reconstructions and MIPs were obtained to evaluate the vascular anatomy. CONTRAST:  51mL OMNIPAQUE IOHEXOL 350 MG/ML SOLN COMPARISON:  Chest radiographs 08/02/2021.  CTA neck 01/27/2021. FINDINGS: Cardiovascular: Good contrast bolus timing in the pulmonary arterial tree. Respiratory motion at the lung bases. No focal filling defect identified in the pulmonary arteries to suggest acute pulmonary embolism. Calcified coronary artery atherosclerosis on series 5, image 167. No cardiomegaly or pericardial effusion. Mild to moderate aortic atherosclerosis. Mediastinum/Nodes: Negative.  No lymphadenopathy. Lungs/Pleura: Elevated right hemidiaphragm with confluent and enhancing right lower lobe atelectasis. Platelike opacity in the left lower lobe also more resembles atelectasis than infection. The major  airways are patent although there is globular retained secretions along the right lateral wall of the trachea at the thoracic inlet on series 6, image 13. No pleural effusion. No consolidation. Upper Abdomen: Negative visible liver, gallbladder, spleen, pancreas, adrenal glands, and bowel in the upper abdomen. Partially visible kidneys demonstrate symmetric renal enhancement but also moderate nonspecific pararenal space stranding (series 4, image 90). Musculoskeletal: Chronic or congenital mid sternal deformity on series 6, image 65. Osteopenia. Chronic T5 superior endplate Schmorl's node. Partially visible chronic left clavicle deformity. No acute osseous abnormality identified. Review of the MIP images confirms the above findings. IMPRESSION: 1. Negative for acute pulmonary embolus. 2. Elevated right hemidiaphragm with right lower lobe atelectasis. And plate-like opacity in the left lower lobe also more resembles atelectasis than infection. There are retained secretions in the trachea. No pleural effusion. 3. Partially visible pararenal space stranding is nonspecific. Query acute renal insufficiency. 4. Aortic Atherosclerosis (ICD10-I70.0). Electronically Signed   By: Genevie Ann M.D.   On: 08/03/2021 10:07   ECHOCARDIOGRAM COMPLETE  Result Date: 08/04/2021    ECHOCARDIOGRAM REPORT   Patient Name:   ELFIDA SHIMADA Date of Exam: 08/04/2021 Medical Rec #:  [id number]       Height:       62.0 in Accession #:    [id number]      Weight:       133.0 lb Date of Birth:  04/26/1951        BSA:          1.607 m Patient Age:    5 years        BP:           137/60 mmHg Patient Gender: F               HR:  91 bpm. Exam Location:  [facility] Procedure: 2D Echo, Cardiac Doppler and Color Doppler Indications:    Abnormal ECG  History:        Patient has prior history of Echocardiogram examinations, most                 recent 01/08/2021. Stroke; Risk Factors:Hypertension, Dyslipidemia                 and Current Smoker.   Sonographer:    Wenda Low Referring Phys: [phone] [facility]  Sonographer Comments: Technically difficult study due to poor echo windows. COVID + IMPRESSIONS  1. Left ventricular ejection fraction, by estimation, is 70 to 75%. The left ventricle has hyperdynamic function. The left ventricle has no regional wall motion abnormalities. Left ventricular diastolic parameters are indeterminate.  2. Right ventricular systolic function is normal. The right ventricular size is normal. Tricuspid regurgitation signal is inadequate for assessing PA pressure.  3. There is a trivial pericardial effusion anterior to the right ventricle and posterior to the left ventricle.  4. The mitral valve is grossly normal. Trivial mitral valve regurgitation.  5. The aortic valve is tricuspid. There is mild calcification of the aortic valve. Aortic valve regurgitation is not visualized. Mild aortic valve sclerosis is present, with no evidence of aortic valve stenosis. Aortic valve mean gradient measures 3.0 mmHg.  6. The inferior vena cava is normal in size with greater than 50% respiratory variability, suggesting right atrial pressure of 3 mmHg. Comparison(s): No significant change from prior study. Prior images reviewed side by side. FINDINGS  Left Ventricle: Left ventricular ejection fraction, by estimation, is 70 to 75%. The left ventricle has hyperdynamic function. The left ventricle has no regional wall motion abnormalities. The left ventricular internal cavity size was normal in size. There is no left ventricular hypertrophy. Left ventricular diastolic parameters are indeterminate. Right Ventricle: The right ventricular size is normal. No increase in right ventricular wall thickness. Right ventricular systolic function is normal. Tricuspid regurgitation signal is inadequate for assessing PA pressure. Left Atrium: Left atrial size was normal in size. Right Atrium: Right atrial size was normal in size. Pericardium: Trivial  pericardial effusion is present. The pericardial effusion is anterior to the right ventricle and posterior to the left ventricle. Mitral Valve: The mitral valve is grossly normal. Trivial mitral valve regurgitation. MV peak gradient, 6.6 mmHg. The mean mitral valve gradient is 2.0 mmHg. Tricuspid Valve: The tricuspid valve is not well visualized. Tricuspid valve regurgitation is trivial. Aortic Valve: The aortic valve is tricuspid. There is mild calcification of the aortic valve. There is mild aortic valve annular calcification. Aortic valve regurgitation is not visualized. Mild aortic valve sclerosis is present, with no evidence of aortic valve stenosis. Aortic valve mean gradient measures 3.0 mmHg. Aortic valve peak gradient measures 5.8 mmHg. Aortic valve area, by VTI measures 2.45 cm. Pulmonic Valve: The pulmonic valve was not well visualized. Pulmonic valve regurgitation is trivial. Aorta: The aortic root is normal in size and structure. Venous: The inferior vena cava is normal in size with greater than 50% respiratory variability, suggesting right atrial pressure of 3 mmHg. IAS/Shunts: No atrial level shunt detected by color flow Doppler.  LEFT VENTRICLE PLAX 2D LVIDd:         3.70 cm   Diastology LVIDs:         1.90 cm   LV e' medial:    5.55 cm/s LV PW:         0.80  cm   LV E/e' medial:  11.6 LV IVS:        0.90 cm   LV e' lateral:   8.16 cm/s LVOT diam:     1.90 cm   LV E/e' lateral: 7.9 LV SV:         61 LV SV Index:   38 LVOT Area:     2.84 cm  RIGHT VENTRICLE RV Basal diam:  2.40 cm RV Mid diam:    2.10 cm LEFT ATRIUM         Index LA diam:    2.10 cm 1.31 cm/m  AORTIC VALVE AV Area (Vmax):    2.62 cm AV Area (Vmean):   2.31 cm AV Area (VTI):     2.45 cm AV Vmax:           120.00 cm/s AV Vmean:          84.800 cm/s AV VTI:            0.250 m AV Peak Grad:      5.8 mmHg AV Mean Grad:      3.0 mmHg LVOT Vmax:         111.00 cm/s LVOT Vmean:        69.000 cm/s LVOT VTI:          0.216 m LVOT/AV VTI  ratio: 0.86  AORTA Ao Root diam: 2.50 cm MITRAL VALVE MV Area (PHT): 2.75 cm    SHUNTS MV Area VTI:   3.45 cm    Systemic VTI:  0.22 m MV Peak grad:  6.6 mmHg    Systemic Diam: 1.90 cm MV Mean grad:  2.0 mmHg MV Vmax:       1.28 m/s MV Vmean:      65.6 cm/s MV Decel Time: 276 msec MV E velocity: 64.30 cm/s MV A velocity: 93.00 cm/s MV E/A ratio:  0.69 Rozann Lesches MD Electronically signed by Rozann Lesches MD Signature Date/Time: 08/04/2021/10:52:24 AM    Final     Micro Results    Recent Results (from the past 240 hour(s))  Resp Panel by RT-PCR (Flu A&B, Covid) Nasopharyngeal Swab     Status: Abnormal   Collection Time: 08/02/21  8:01 PM   Specimen: Nasopharyngeal Swab; Nasopharyngeal(NP) swabs in vial transport medium  Result Value Ref Range Status   SARS Coronavirus 2 by RT PCR POSITIVE (A) NEGATIVE Final    Comment: CRITICAL RESULT CALLED TO, READ BACK BY AND VERIFIED WITH: MACEY BRAME RN,[id number],08/02/2021,SELF S (NOTE) SARS-CoV-2 target nucleic acids are DETECTED.  The SARS-CoV-2 RNA is generally detectable in upper respiratory specimens during the acute phase of infection. Positive results are indicative of the presence of the identified virus, but do not rule out bacterial infection or co-infection with other pathogens not detected by the test. Clinical correlation with patient history and other diagnostic information is necessary to determine patient infection status. The expected result is Negative.  Fact Sheet for Patients: EntrepreneurPulse.com.au  Fact Sheet for Healthcare Providers: IncredibleEmployment.be  This test is not yet approved or cleared by the [place] FDA and  has been authorized for detection and/or diagnosis of SARS-CoV-2 by FDA under an Emergency Use Authorization (EUA).  This EUA will remain in effect (meaning thi s test can be used) for the duration of  the COVID-19 declaration under Section 564(b)(1) of the  Act, 21 U.S.C. section 360bbb-3(b)(1), unless the authorization is terminated or revoked sooner.     Influenza A by PCR NEGATIVE NEGATIVE Final  Influenza B by PCR NEGATIVE NEGATIVE Final    Comment: (NOTE) The Xpert Xpress SARS-CoV-2/FLU/RSV plus assay is intended as an aid in the diagnosis of influenza from Nasopharyngeal swab specimens and should not be used as a sole basis for treatment. Nasal washings and aspirates are unacceptable for Xpert Xpress SARS-CoV-2/FLU/RSV testing.  Fact Sheet for Patients: EntrepreneurPulse.com.au  Fact Sheet for Healthcare Providers: IncredibleEmployment.be  This test is not yet approved or cleared by the [place] FDA and has been authorized for detection and/or diagnosis of SARS-CoV-2 by FDA under an Emergency Use Authorization (EUA). This EUA will remain in effect (meaning this test can be used) for the duration of the COVID-19 declaration under Section 564(b)(1) of the Act, 21 U.S.C. section 360bbb-3(b)(1), unless the authorization is terminated or revoked.  Performed at [facility], [address], [place], [place] [address]     Today   Subjective    Mekayla Soman today has no new complaints  No fever  Or chills   No Nausea, Vomiting or Diarrhea -No productive cough or dyspnea        Husband visited -attempted to reach husband later for additional update at [phone] and [phone]   Patient has been seen and examined prior to discharge   Objective   Blood pressure (!) 92/38, pulse 98, temperature (!) 97.4 F (36.3 C), temperature source Oral, resp. rate 18, height 5\' 2"  (1.575 m), weight 60.3 kg, SpO2 92 %.   Intake/Output Summary (Last 24 hours) at 08/04/2021 1406 Last data filed at 08/04/2021 0600 Gross per 24 hour  Intake 1577.9 ml  Output 100 ml  Net 1477.9 ml   Exam Gen:- Awake Alert, no acute distress  HEENT:- [place].AT, No sclera icterus Neck-Supple Neck,No JVD,.   Lungs-  CTAB , good air movement bilaterally  CV- S1, S2 normal, regular Abd-  +ve B.Sounds, Abd Soft, No tenderness,    Extremity/Skin:- No  edema,   good pulses Psych-affect is appropriate, oriented x3 Neuro-chronic residual left hemiparesis ,no new focal deficits, no tremors    Data Review   CBC w Diff:  Lab Results  Component Value Date   WBC 7.2 08/04/2021   HGB 12.4 08/04/2021   HCT 37.8 08/04/2021   PLT 225 08/04/2021   LYMPHOPCT 27 08/04/2021   MONOPCT 9 08/04/2021   EOSPCT 2 08/04/2021   BASOPCT 1 08/04/2021    CMP:  Lab Results  Component Value Date   NA 143 08/04/2021   K 4.2 08/04/2021   CL 109 08/04/2021   CO2 28 08/04/2021   BUN 22 08/04/2021   CREATININE 0.93 08/04/2021   PROT 5.9 (L) 08/04/2021   ALBUMIN 3.1 (L) 08/04/2021   BILITOT 0.6 08/04/2021   ALKPHOS 40 08/04/2021   AST 10 (L) 08/04/2021   ALT 10 08/04/2021    Total Discharge time is about 33 minutes  Roxan Hockey M.D on 08/04/2021 at 2:06 PM  Go to [email or web address] -  for contact info  [facility] - Office  [phone]

## 2021-08-04 NOTE — Progress Notes (Signed)
*  PRELIMINARY RESULTS* Echocardiogram 2D Echocardiogram has been performed.  Susan Davidson 08/04/2021, 10:41 AM

## 2021-08-04 NOTE — TOC Initial Note (Signed)
Transition of Care [facility]) - Initial/Assessment Note    Patient Details  Name: Susan Davidson MRN: [id number] Date of Birth: Jul 18, 1951  Transition of Care [facility]) CM/SW Contact:    Ihor Gully, LCSW Phone Number: 08/04/2021, 2:25 PM  Clinical Narrative:                 Patient is a LTC resident at [facility]. Patient uses a wheelchair. COVID+. Agreeable to return to facility.  Expected Discharge Plan: Skilled Nursing Facility Barriers to Discharge: No Barriers Identified   Patient Goals and CMS Choice        Expected Discharge Plan and Services Expected Discharge Plan: [place]       Living arrangements for the past 2 months: [place] Expected Discharge Date: 08/04/21                                    Prior Living Arrangements/Services Living arrangements for the past 2 months: [place]   Patient language and need for interpreter reviewed:: Yes        Need for Family Participation in Patient Care: Yes (Comment) Care giver support system in place?: Yes (comment)   Criminal Activity/Legal Involvement Pertinent to Current Situation/Hospitalization: No - Comment as needed  Activities of Daily Living Home Assistive Devices/Equipment: Wheelchair ADL Screening (condition at time of admission) Patient's cognitive ability adequate to safely complete daily activities?: No Is the patient deaf or have difficulty hearing?: No Does the patient have difficulty seeing, even when wearing glasses/contacts?: No Does the patient have difficulty concentrating, remembering, or making decisions?: No Patient able to express need for assistance with ADLs?: Yes Does the patient have difficulty dressing or bathing?: Yes Independently performs ADLs?: No Communication: Independent Dressing (OT): Needs assistance Is this a change from baseline?: Pre-admission baseline Grooming: Needs assistance Is this a change from baseline?:  Pre-admission baseline Feeding: Independent Bathing: Needs assistance Is this a change from baseline?: Pre-admission baseline Toileting: Needs assistance Is this a change from baseline?: Pre-admission baseline In/Out Bed: Needs assistance Is this a change from baseline?: Pre-admission baseline Walks in Home: Dependent (does not wak) Is this a change from baseline?: Pre-admission baseline Does the patient have difficulty walking or climbing stairs?: Yes Weakness of Legs: Both Weakness of Arms/Hands: Both  Permission Sought/Granted                  Emotional Assessment     Affect (typically observed): Appropriate Orientation: : Oriented to Self, Oriented to Place, Oriented to  Time, Oriented to Situation Alcohol / Substance Use: Not Applicable Psych Involvement: No (comment)  Admission diagnosis:  Unresponsiveness [R41.89] COVID-19 virus infection [U07.1] Patient Active Problem List   Diagnosis Date Noted   Unresponsiveness 35/32/9924   Acute metabolic encephalopathy 26/83/4196   CKD (chronic kidney disease) stage 3, GFR 30-59 ml/min ([place]) 05/04/2021   COVID-19 virus infection 05/04/2021   Lower urinary tract infectious disease    Generalized weakness 01/27/2021   Spinal stenosis at L4-L5 level 01/27/2021   CVA (cerebral vascular accident) ([place]) 01/27/2021   Ambulatory dysfunction 01/26/2021   Tobacco use disorder 01/26/2021   CKD (chronic kidney disease) 11/15/2020   B12 deficiency 11/09/2020   Urinary frequency 11/02/2018   Daytime somnolence 10/19/2012   Impaired glucose tolerance 08/27/2011   Preventative health care 08/27/2011   Vitamin D deficiency 04/14/2010   HYPERSOMNIA 02/19/2009   Hypothyroidism 02/12/2008  HLD (hyperlipidemia) 02/12/2008   ANXIETY 02/12/2008   Depression 02/12/2008   Essential hypertension 02/12/2008   OSTEOPENIA 02/12/2008   PCP:  Caprice Renshaw, MD Pharmacy:   [place], [place] [address] [place] [address] Phone: [phone] Fax: [phone]     Social Determinants of Health ([place]) Interventions    Readmission Risk Interventions Readmission Risk Prevention Plan 01/29/2021  Medication Screening Complete  Transportation Screening Complete  Some recent data might be hidden

## 2021-08-04 NOTE — Progress Notes (Signed)
Paxlovid was not in pyxis or patient drawer. Spoke with [facility] who states this med was in ED yesterday and she would check. Later to find it was not to be found. It is now in patient specific drawer in med room and not in patient's room. However, patient is refusing this medication until she can speak to the doctor about it.

## 2021-08-05 DIAGNOSIS — F32A Depression, unspecified: Secondary | ICD-10-CM | POA: Diagnosis not present

## 2021-08-05 DIAGNOSIS — I10 Essential (primary) hypertension: Secondary | ICD-10-CM | POA: Diagnosis not present

## 2021-08-05 DIAGNOSIS — E039 Hypothyroidism, unspecified: Secondary | ICD-10-CM | POA: Diagnosis not present

## 2021-08-05 DIAGNOSIS — F419 Anxiety disorder, unspecified: Secondary | ICD-10-CM | POA: Diagnosis not present

## 2021-08-05 DIAGNOSIS — G8194 Hemiplegia, unspecified affecting left nondominant side: Secondary | ICD-10-CM | POA: Diagnosis not present

## 2021-08-05 DIAGNOSIS — E785 Hyperlipidemia, unspecified: Secondary | ICD-10-CM | POA: Diagnosis not present

## 2021-08-05 DIAGNOSIS — U071 COVID-19: Secondary | ICD-10-CM | POA: Diagnosis not present

## 2021-08-05 DIAGNOSIS — I679 Cerebrovascular disease, unspecified: Secondary | ICD-10-CM | POA: Diagnosis not present

## 2021-08-09 DIAGNOSIS — I10 Essential (primary) hypertension: Secondary | ICD-10-CM | POA: Diagnosis not present

## 2021-08-09 DIAGNOSIS — I679 Cerebrovascular disease, unspecified: Secondary | ICD-10-CM | POA: Diagnosis not present

## 2021-08-09 DIAGNOSIS — E785 Hyperlipidemia, unspecified: Secondary | ICD-10-CM | POA: Diagnosis not present

## 2021-08-09 DIAGNOSIS — F32A Depression, unspecified: Secondary | ICD-10-CM | POA: Diagnosis not present

## 2021-08-09 DIAGNOSIS — E039 Hypothyroidism, unspecified: Secondary | ICD-10-CM | POA: Diagnosis not present

## 2021-08-09 DIAGNOSIS — E539 Vitamin B deficiency, unspecified: Secondary | ICD-10-CM | POA: Diagnosis not present

## 2021-08-09 DIAGNOSIS — F909 Attention-deficit hyperactivity disorder, unspecified type: Secondary | ICD-10-CM | POA: Diagnosis not present

## 2021-08-09 DIAGNOSIS — F419 Anxiety disorder, unspecified: Secondary | ICD-10-CM | POA: Diagnosis not present

## 2021-08-10 DIAGNOSIS — E039 Hypothyroidism, unspecified: Secondary | ICD-10-CM | POA: Diagnosis not present

## 2021-08-10 DIAGNOSIS — N184 Chronic kidney disease, stage 4 (severe): Secondary | ICD-10-CM | POA: Diagnosis not present

## 2021-08-10 DIAGNOSIS — D649 Anemia, unspecified: Secondary | ICD-10-CM | POA: Diagnosis not present

## 2021-08-13 DIAGNOSIS — F319 Bipolar disorder, unspecified: Secondary | ICD-10-CM | POA: Diagnosis not present

## 2021-08-13 DIAGNOSIS — F411 Generalized anxiety disorder: Secondary | ICD-10-CM | POA: Diagnosis not present

## 2021-08-13 DIAGNOSIS — F321 Major depressive disorder, single episode, moderate: Secondary | ICD-10-CM | POA: Diagnosis not present

## 2021-08-17 ENCOUNTER — Ambulatory Visit (HOSPITAL_COMMUNITY)
Admission: RE | Admit: 2021-08-17 | Discharge: 2021-08-17 | Disposition: A | Discharge status: Home / Self Care | Payer: Medicare HMO | Source: Ambulatory Visit | Attending: Interventional Radiology | Admitting: Interventional Radiology

## 2021-08-17 ENCOUNTER — Other Ambulatory Visit: Payer: Self-pay

## 2021-08-17 DIAGNOSIS — I671 Cerebral aneurysm, nonruptured: Secondary | ICD-10-CM | POA: Diagnosis not present

## 2021-08-18 DIAGNOSIS — F321 Major depressive disorder, single episode, moderate: Secondary | ICD-10-CM | POA: Diagnosis not present

## 2021-08-18 DIAGNOSIS — F411 Generalized anxiety disorder: Secondary | ICD-10-CM | POA: Diagnosis not present

## 2021-08-18 HISTORY — PX: IR RADIOLOGIST EVAL & MGMT: IMG5224

## 2021-08-25 DIAGNOSIS — R1312 Dysphagia, oropharyngeal phase: Secondary | ICD-10-CM | POA: Diagnosis not present

## 2021-08-25 DIAGNOSIS — M6281 Muscle weakness (generalized): Secondary | ICD-10-CM | POA: Diagnosis not present

## 2021-08-25 DIAGNOSIS — I63521 Cerebral infarction due to unspecified occlusion or stenosis of right anterior cerebral artery: Secondary | ICD-10-CM | POA: Diagnosis not present

## 2021-08-25 DIAGNOSIS — I672 Cerebral atherosclerosis: Secondary | ICD-10-CM | POA: Diagnosis not present

## 2021-08-26 DIAGNOSIS — F321 Major depressive disorder, single episode, moderate: Secondary | ICD-10-CM | POA: Diagnosis not present

## 2021-08-26 DIAGNOSIS — F411 Generalized anxiety disorder: Secondary | ICD-10-CM | POA: Diagnosis not present

## 2021-08-30 DIAGNOSIS — R1312 Dysphagia, oropharyngeal phase: Secondary | ICD-10-CM | POA: Diagnosis not present

## 2021-08-30 DIAGNOSIS — I63521 Cerebral infarction due to unspecified occlusion or stenosis of right anterior cerebral artery: Secondary | ICD-10-CM | POA: Diagnosis not present

## 2021-08-30 DIAGNOSIS — M6281 Muscle weakness (generalized): Secondary | ICD-10-CM | POA: Diagnosis not present

## 2021-08-30 DIAGNOSIS — I672 Cerebral atherosclerosis: Secondary | ICD-10-CM | POA: Diagnosis not present

## 2021-08-31 DIAGNOSIS — I63521 Cerebral infarction due to unspecified occlusion or stenosis of right anterior cerebral artery: Secondary | ICD-10-CM | POA: Diagnosis not present

## 2021-08-31 DIAGNOSIS — M6281 Muscle weakness (generalized): Secondary | ICD-10-CM | POA: Diagnosis not present

## 2021-08-31 DIAGNOSIS — I672 Cerebral atherosclerosis: Secondary | ICD-10-CM | POA: Diagnosis not present

## 2021-08-31 DIAGNOSIS — R1312 Dysphagia, oropharyngeal phase: Secondary | ICD-10-CM | POA: Diagnosis not present

## 2021-09-01 DIAGNOSIS — I672 Cerebral atherosclerosis: Secondary | ICD-10-CM | POA: Diagnosis not present

## 2021-09-01 DIAGNOSIS — F411 Generalized anxiety disorder: Secondary | ICD-10-CM | POA: Diagnosis not present

## 2021-09-01 DIAGNOSIS — I63521 Cerebral infarction due to unspecified occlusion or stenosis of right anterior cerebral artery: Secondary | ICD-10-CM | POA: Diagnosis not present

## 2021-09-01 DIAGNOSIS — R1312 Dysphagia, oropharyngeal phase: Secondary | ICD-10-CM | POA: Diagnosis not present

## 2021-09-01 DIAGNOSIS — F321 Major depressive disorder, single episode, moderate: Secondary | ICD-10-CM | POA: Diagnosis not present

## 2021-09-01 DIAGNOSIS — M6281 Muscle weakness (generalized): Secondary | ICD-10-CM | POA: Diagnosis not present

## 2021-09-02 DIAGNOSIS — R1312 Dysphagia, oropharyngeal phase: Secondary | ICD-10-CM | POA: Diagnosis not present

## 2021-09-02 DIAGNOSIS — M6281 Muscle weakness (generalized): Secondary | ICD-10-CM | POA: Diagnosis not present

## 2021-09-02 DIAGNOSIS — I672 Cerebral atherosclerosis: Secondary | ICD-10-CM | POA: Diagnosis not present

## 2021-09-02 DIAGNOSIS — I63521 Cerebral infarction due to unspecified occlusion or stenosis of right anterior cerebral artery: Secondary | ICD-10-CM | POA: Diagnosis not present

## 2021-09-03 DIAGNOSIS — M6281 Muscle weakness (generalized): Secondary | ICD-10-CM | POA: Diagnosis not present

## 2021-09-03 DIAGNOSIS — I672 Cerebral atherosclerosis: Secondary | ICD-10-CM | POA: Diagnosis not present

## 2021-09-03 DIAGNOSIS — R1312 Dysphagia, oropharyngeal phase: Secondary | ICD-10-CM | POA: Diagnosis not present

## 2021-09-03 DIAGNOSIS — I63521 Cerebral infarction due to unspecified occlusion or stenosis of right anterior cerebral artery: Secondary | ICD-10-CM | POA: Diagnosis not present

## 2021-09-06 DIAGNOSIS — I63521 Cerebral infarction due to unspecified occlusion or stenosis of right anterior cerebral artery: Secondary | ICD-10-CM | POA: Diagnosis not present

## 2021-09-06 DIAGNOSIS — R1312 Dysphagia, oropharyngeal phase: Secondary | ICD-10-CM | POA: Diagnosis not present

## 2021-09-06 DIAGNOSIS — I672 Cerebral atherosclerosis: Secondary | ICD-10-CM | POA: Diagnosis not present

## 2021-09-06 DIAGNOSIS — M6281 Muscle weakness (generalized): Secondary | ICD-10-CM | POA: Diagnosis not present

## 2021-09-07 DIAGNOSIS — I672 Cerebral atherosclerosis: Secondary | ICD-10-CM | POA: Diagnosis not present

## 2021-09-07 DIAGNOSIS — M6281 Muscle weakness (generalized): Secondary | ICD-10-CM | POA: Diagnosis not present

## 2021-09-07 DIAGNOSIS — R1312 Dysphagia, oropharyngeal phase: Secondary | ICD-10-CM | POA: Diagnosis not present

## 2021-09-07 DIAGNOSIS — I63521 Cerebral infarction due to unspecified occlusion or stenosis of right anterior cerebral artery: Secondary | ICD-10-CM | POA: Diagnosis not present

## 2021-09-08 DIAGNOSIS — G2401 Drug induced subacute dyskinesia: Secondary | ICD-10-CM | POA: Diagnosis not present

## 2021-09-08 DIAGNOSIS — M6281 Muscle weakness (generalized): Secondary | ICD-10-CM | POA: Diagnosis not present

## 2021-09-08 DIAGNOSIS — F330 Major depressive disorder, recurrent, mild: Secondary | ICD-10-CM | POA: Diagnosis not present

## 2021-09-08 DIAGNOSIS — F01A3 Vascular dementia, mild, with mood disturbance: Secondary | ICD-10-CM | POA: Diagnosis not present

## 2021-09-08 DIAGNOSIS — I63521 Cerebral infarction due to unspecified occlusion or stenosis of right anterior cerebral artery: Secondary | ICD-10-CM | POA: Diagnosis not present

## 2021-09-08 DIAGNOSIS — R1312 Dysphagia, oropharyngeal phase: Secondary | ICD-10-CM | POA: Diagnosis not present

## 2021-09-08 DIAGNOSIS — F411 Generalized anxiety disorder: Secondary | ICD-10-CM | POA: Diagnosis not present

## 2021-09-08 DIAGNOSIS — I672 Cerebral atherosclerosis: Secondary | ICD-10-CM | POA: Diagnosis not present

## 2021-09-09 DIAGNOSIS — R1312 Dysphagia, oropharyngeal phase: Secondary | ICD-10-CM | POA: Diagnosis not present

## 2021-09-09 DIAGNOSIS — H5213 Myopia, bilateral: Secondary | ICD-10-CM | POA: Diagnosis not present

## 2021-09-09 DIAGNOSIS — M6281 Muscle weakness (generalized): Secondary | ICD-10-CM | POA: Diagnosis not present

## 2021-09-09 DIAGNOSIS — I63521 Cerebral infarction due to unspecified occlusion or stenosis of right anterior cerebral artery: Secondary | ICD-10-CM | POA: Diagnosis not present

## 2021-09-09 DIAGNOSIS — H2513 Age-related nuclear cataract, bilateral: Secondary | ICD-10-CM | POA: Diagnosis not present

## 2021-09-09 DIAGNOSIS — I672 Cerebral atherosclerosis: Secondary | ICD-10-CM | POA: Diagnosis not present

## 2021-09-10 DIAGNOSIS — R1312 Dysphagia, oropharyngeal phase: Secondary | ICD-10-CM | POA: Diagnosis not present

## 2021-09-10 DIAGNOSIS — M6281 Muscle weakness (generalized): Secondary | ICD-10-CM | POA: Diagnosis not present

## 2021-09-10 DIAGNOSIS — I63521 Cerebral infarction due to unspecified occlusion or stenosis of right anterior cerebral artery: Secondary | ICD-10-CM | POA: Diagnosis not present

## 2021-09-10 DIAGNOSIS — I672 Cerebral atherosclerosis: Secondary | ICD-10-CM | POA: Diagnosis not present

## 2021-09-13 DIAGNOSIS — R1312 Dysphagia, oropharyngeal phase: Secondary | ICD-10-CM | POA: Diagnosis not present

## 2021-09-13 DIAGNOSIS — I672 Cerebral atherosclerosis: Secondary | ICD-10-CM | POA: Diagnosis not present

## 2021-09-13 DIAGNOSIS — I63521 Cerebral infarction due to unspecified occlusion or stenosis of right anterior cerebral artery: Secondary | ICD-10-CM | POA: Diagnosis not present

## 2021-09-13 DIAGNOSIS — M6281 Muscle weakness (generalized): Secondary | ICD-10-CM | POA: Diagnosis not present

## 2021-09-15 DIAGNOSIS — F321 Major depressive disorder, single episode, moderate: Secondary | ICD-10-CM | POA: Diagnosis not present

## 2021-09-15 DIAGNOSIS — I672 Cerebral atherosclerosis: Secondary | ICD-10-CM | POA: Diagnosis not present

## 2021-09-15 DIAGNOSIS — M6281 Muscle weakness (generalized): Secondary | ICD-10-CM | POA: Diagnosis not present

## 2021-09-15 DIAGNOSIS — F319 Bipolar disorder, unspecified: Secondary | ICD-10-CM | POA: Diagnosis not present

## 2021-09-15 DIAGNOSIS — I63521 Cerebral infarction due to unspecified occlusion or stenosis of right anterior cerebral artery: Secondary | ICD-10-CM | POA: Diagnosis not present

## 2021-09-15 DIAGNOSIS — R1312 Dysphagia, oropharyngeal phase: Secondary | ICD-10-CM | POA: Diagnosis not present

## 2021-09-15 DIAGNOSIS — F411 Generalized anxiety disorder: Secondary | ICD-10-CM | POA: Diagnosis not present

## 2021-09-16 DIAGNOSIS — R1312 Dysphagia, oropharyngeal phase: Secondary | ICD-10-CM | POA: Diagnosis not present

## 2021-09-16 DIAGNOSIS — I63521 Cerebral infarction due to unspecified occlusion or stenosis of right anterior cerebral artery: Secondary | ICD-10-CM | POA: Diagnosis not present

## 2021-09-16 DIAGNOSIS — M6281 Muscle weakness (generalized): Secondary | ICD-10-CM | POA: Diagnosis not present

## 2021-09-16 DIAGNOSIS — I672 Cerebral atherosclerosis: Secondary | ICD-10-CM | POA: Diagnosis not present

## 2021-09-18 DIAGNOSIS — I63521 Cerebral infarction due to unspecified occlusion or stenosis of right anterior cerebral artery: Secondary | ICD-10-CM | POA: Diagnosis not present

## 2021-09-18 DIAGNOSIS — M6281 Muscle weakness (generalized): Secondary | ICD-10-CM | POA: Diagnosis not present

## 2021-09-18 DIAGNOSIS — R1312 Dysphagia, oropharyngeal phase: Secondary | ICD-10-CM | POA: Diagnosis not present

## 2021-09-18 DIAGNOSIS — I672 Cerebral atherosclerosis: Secondary | ICD-10-CM | POA: Diagnosis not present

## 2021-09-20 DIAGNOSIS — M6281 Muscle weakness (generalized): Secondary | ICD-10-CM | POA: Diagnosis not present

## 2021-09-20 DIAGNOSIS — I63521 Cerebral infarction due to unspecified occlusion or stenosis of right anterior cerebral artery: Secondary | ICD-10-CM | POA: Diagnosis not present

## 2021-09-20 DIAGNOSIS — R1312 Dysphagia, oropharyngeal phase: Secondary | ICD-10-CM | POA: Diagnosis not present

## 2021-09-20 DIAGNOSIS — I672 Cerebral atherosclerosis: Secondary | ICD-10-CM | POA: Diagnosis not present

## 2021-09-21 DIAGNOSIS — I672 Cerebral atherosclerosis: Secondary | ICD-10-CM | POA: Diagnosis not present

## 2021-09-21 DIAGNOSIS — M6281 Muscle weakness (generalized): Secondary | ICD-10-CM | POA: Diagnosis not present

## 2021-09-21 DIAGNOSIS — R1312 Dysphagia, oropharyngeal phase: Secondary | ICD-10-CM | POA: Diagnosis not present

## 2021-09-21 DIAGNOSIS — I63521 Cerebral infarction due to unspecified occlusion or stenosis of right anterior cerebral artery: Secondary | ICD-10-CM | POA: Diagnosis not present

## 2021-09-22 DIAGNOSIS — I63521 Cerebral infarction due to unspecified occlusion or stenosis of right anterior cerebral artery: Secondary | ICD-10-CM | POA: Diagnosis not present

## 2021-09-22 DIAGNOSIS — R1312 Dysphagia, oropharyngeal phase: Secondary | ICD-10-CM | POA: Diagnosis not present

## 2021-09-22 DIAGNOSIS — M6281 Muscle weakness (generalized): Secondary | ICD-10-CM | POA: Diagnosis not present

## 2021-09-22 DIAGNOSIS — I672 Cerebral atherosclerosis: Secondary | ICD-10-CM | POA: Diagnosis not present

## 2021-09-23 DIAGNOSIS — F321 Major depressive disorder, single episode, moderate: Secondary | ICD-10-CM | POA: Diagnosis not present

## 2021-09-23 DIAGNOSIS — F411 Generalized anxiety disorder: Secondary | ICD-10-CM | POA: Diagnosis not present

## 2021-09-24 DIAGNOSIS — R1312 Dysphagia, oropharyngeal phase: Secondary | ICD-10-CM | POA: Diagnosis not present

## 2021-09-24 DIAGNOSIS — M6281 Muscle weakness (generalized): Secondary | ICD-10-CM | POA: Diagnosis not present

## 2021-09-24 DIAGNOSIS — I63521 Cerebral infarction due to unspecified occlusion or stenosis of right anterior cerebral artery: Secondary | ICD-10-CM | POA: Diagnosis not present

## 2021-09-24 DIAGNOSIS — I672 Cerebral atherosclerosis: Secondary | ICD-10-CM | POA: Diagnosis not present

## 2021-09-28 DIAGNOSIS — I63521 Cerebral infarction due to unspecified occlusion or stenosis of right anterior cerebral artery: Secondary | ICD-10-CM | POA: Diagnosis not present

## 2021-09-28 DIAGNOSIS — R1312 Dysphagia, oropharyngeal phase: Secondary | ICD-10-CM | POA: Diagnosis not present

## 2021-09-28 DIAGNOSIS — I672 Cerebral atherosclerosis: Secondary | ICD-10-CM | POA: Diagnosis not present

## 2021-09-28 DIAGNOSIS — M6281 Muscle weakness (generalized): Secondary | ICD-10-CM | POA: Diagnosis not present

## 2021-09-29 DIAGNOSIS — M6281 Muscle weakness (generalized): Secondary | ICD-10-CM | POA: Diagnosis not present

## 2021-09-29 DIAGNOSIS — R1312 Dysphagia, oropharyngeal phase: Secondary | ICD-10-CM | POA: Diagnosis not present

## 2021-09-29 DIAGNOSIS — I63521 Cerebral infarction due to unspecified occlusion or stenosis of right anterior cerebral artery: Secondary | ICD-10-CM | POA: Diagnosis not present

## 2021-09-29 DIAGNOSIS — I672 Cerebral atherosclerosis: Secondary | ICD-10-CM | POA: Diagnosis not present

## 2021-09-30 DIAGNOSIS — F319 Bipolar disorder, unspecified: Secondary | ICD-10-CM | POA: Diagnosis not present

## 2021-09-30 DIAGNOSIS — F321 Major depressive disorder, single episode, moderate: Secondary | ICD-10-CM | POA: Diagnosis not present

## 2021-09-30 DIAGNOSIS — E785 Hyperlipidemia, unspecified: Secondary | ICD-10-CM | POA: Diagnosis not present

## 2021-09-30 DIAGNOSIS — F411 Generalized anxiety disorder: Secondary | ICD-10-CM | POA: Diagnosis not present

## 2021-09-30 DIAGNOSIS — E559 Vitamin D deficiency, unspecified: Secondary | ICD-10-CM | POA: Diagnosis not present

## 2021-09-30 DIAGNOSIS — I672 Cerebral atherosclerosis: Secondary | ICD-10-CM | POA: Diagnosis not present

## 2021-09-30 DIAGNOSIS — I63521 Cerebral infarction due to unspecified occlusion or stenosis of right anterior cerebral artery: Secondary | ICD-10-CM | POA: Diagnosis not present

## 2021-09-30 DIAGNOSIS — I10 Essential (primary) hypertension: Secondary | ICD-10-CM | POA: Diagnosis not present

## 2021-09-30 DIAGNOSIS — E039 Hypothyroidism, unspecified: Secondary | ICD-10-CM | POA: Diagnosis not present

## 2021-09-30 DIAGNOSIS — F32A Depression, unspecified: Secondary | ICD-10-CM | POA: Diagnosis not present

## 2021-09-30 DIAGNOSIS — R1312 Dysphagia, oropharyngeal phase: Secondary | ICD-10-CM | POA: Diagnosis not present

## 2021-09-30 DIAGNOSIS — F909 Attention-deficit hyperactivity disorder, unspecified type: Secondary | ICD-10-CM | POA: Diagnosis not present

## 2021-09-30 DIAGNOSIS — E539 Vitamin B deficiency, unspecified: Secondary | ICD-10-CM | POA: Diagnosis not present

## 2021-09-30 DIAGNOSIS — M6281 Muscle weakness (generalized): Secondary | ICD-10-CM | POA: Diagnosis not present

## 2021-09-30 DIAGNOSIS — G8194 Hemiplegia, unspecified affecting left nondominant side: Secondary | ICD-10-CM | POA: Diagnosis not present

## 2021-10-02 DIAGNOSIS — R1312 Dysphagia, oropharyngeal phase: Secondary | ICD-10-CM | POA: Diagnosis not present

## 2021-10-02 DIAGNOSIS — I63521 Cerebral infarction due to unspecified occlusion or stenosis of right anterior cerebral artery: Secondary | ICD-10-CM | POA: Diagnosis not present

## 2021-10-02 DIAGNOSIS — M6281 Muscle weakness (generalized): Secondary | ICD-10-CM | POA: Diagnosis not present

## 2021-10-02 DIAGNOSIS — I672 Cerebral atherosclerosis: Secondary | ICD-10-CM | POA: Diagnosis not present

## 2021-10-05 DIAGNOSIS — I672 Cerebral atherosclerosis: Secondary | ICD-10-CM | POA: Diagnosis not present

## 2021-10-05 DIAGNOSIS — R1312 Dysphagia, oropharyngeal phase: Secondary | ICD-10-CM | POA: Diagnosis not present

## 2021-10-07 DIAGNOSIS — E785 Hyperlipidemia, unspecified: Secondary | ICD-10-CM | POA: Diagnosis not present

## 2021-10-07 DIAGNOSIS — F32A Depression, unspecified: Secondary | ICD-10-CM | POA: Diagnosis not present

## 2021-10-07 DIAGNOSIS — G8194 Hemiplegia, unspecified affecting left nondominant side: Secondary | ICD-10-CM | POA: Diagnosis not present

## 2021-10-07 DIAGNOSIS — F909 Attention-deficit hyperactivity disorder, unspecified type: Secondary | ICD-10-CM | POA: Diagnosis not present

## 2021-10-07 DIAGNOSIS — W19XXXD Unspecified fall, subsequent encounter: Secondary | ICD-10-CM | POA: Diagnosis not present

## 2021-10-07 DIAGNOSIS — E539 Vitamin B deficiency, unspecified: Secondary | ICD-10-CM | POA: Diagnosis not present

## 2021-10-07 DIAGNOSIS — F419 Anxiety disorder, unspecified: Secondary | ICD-10-CM | POA: Diagnosis not present

## 2021-10-07 DIAGNOSIS — I10 Essential (primary) hypertension: Secondary | ICD-10-CM | POA: Diagnosis not present

## 2021-10-11 ENCOUNTER — Telehealth: Payer: Self-pay | Admitting: Internal Medicine

## 2021-10-11 NOTE — Telephone Encounter (Signed)
Ok for verbals 

## 2021-10-11 NOTE — Telephone Encounter (Signed)
Caren Griffins calling in  Requesting verbal order for nursing evaluation for patient 01.12.23  Says patient was recently discharged from nursing facility 01.06.23  Cb if needed [phone]

## 2021-10-12 NOTE — Telephone Encounter (Signed)
Verbals left on confidential voicemail

## 2021-10-13 ENCOUNTER — Inpatient Hospital Stay: Payer: Medicare HMO | Admitting: Internal Medicine

## 2021-10-18 NOTE — Telephone Encounter (Signed)
Ok for verbals 

## 2021-10-18 NOTE — Telephone Encounter (Signed)
Susan Davidson is calling in after completing the OT elevation. Susan Davidson is needing Verbal orders for:  [facility] OT 2 times a week for 2 weeks then 1time a week for 2 weeks Then 2 times a week for 2 weeks Then 1 time a week for 1 week  CB [phone]

## 2021-10-19 ENCOUNTER — Inpatient Hospital Stay: Payer: Medicare HMO | Admitting: Internal Medicine

## 2021-10-19 ENCOUNTER — Telehealth: Payer: Self-pay | Admitting: Internal Medicine

## 2021-10-19 DIAGNOSIS — Z79899 Other long term (current) drug therapy: Secondary | ICD-10-CM | POA: Diagnosis not present

## 2021-10-19 DIAGNOSIS — R2689 Other abnormalities of gait and mobility: Secondary | ICD-10-CM | POA: Diagnosis not present

## 2021-10-19 DIAGNOSIS — F429 Obsessive-compulsive disorder, unspecified: Secondary | ICD-10-CM | POA: Diagnosis not present

## 2021-10-19 DIAGNOSIS — G20 Parkinson's disease: Secondary | ICD-10-CM | POA: Diagnosis not present

## 2021-10-19 DIAGNOSIS — I69359 Hemiplegia and hemiparesis following cerebral infarction affecting unspecified side: Secondary | ICD-10-CM | POA: Diagnosis not present

## 2021-10-19 NOTE — Telephone Encounter (Signed)
Home Health verbal orders-caller/Agency: Amy/ [facility]  Callback number: [phone] (secure vm)  Requesting OT/PT/Skilled nursing/Social Work/Speech: PT  Frequency: 2w4 1w4  Start Date: 10-14-2021   Rep also requesting an order for a [facility] Nurse due to patient having a wound on right lower leg

## 2021-10-19 NOTE — Telephone Encounter (Signed)
[place] for verbal order for wound care nurse if this ok as a verbal, thanks

## 2021-10-19 NOTE — Telephone Encounter (Signed)
Verbal orders left on secure voicemail.

## 2021-10-19 NOTE — Telephone Encounter (Signed)
Verbals given  

## 2021-10-21 ENCOUNTER — Inpatient Hospital Stay: Payer: Medicare HMO | Admitting: Internal Medicine

## 2021-10-22 ENCOUNTER — Inpatient Hospital Stay: Payer: Medicare HMO | Admitting: Nurse Practitioner

## 2021-10-29 ENCOUNTER — Encounter: Payer: Self-pay | Admitting: Internal Medicine

## 2021-10-29 ENCOUNTER — Other Ambulatory Visit: Payer: Self-pay

## 2021-10-29 ENCOUNTER — Ambulatory Visit (INDEPENDENT_AMBULATORY_CARE_PROVIDER_SITE_OTHER): Payer: Medicare HMO | Admitting: Internal Medicine

## 2021-10-29 VITALS — BP 128/70 | HR 64 | Temp 99.7°F | Ht 62.0 in

## 2021-10-29 DIAGNOSIS — E782 Mixed hyperlipidemia: Secondary | ICD-10-CM | POA: Diagnosis not present

## 2021-10-29 DIAGNOSIS — I10 Essential (primary) hypertension: Secondary | ICD-10-CM

## 2021-10-29 DIAGNOSIS — Z8673 Personal history of transient ischemic attack (TIA), and cerebral infarction without residual deficits: Secondary | ICD-10-CM

## 2021-10-29 DIAGNOSIS — R7302 Impaired glucose tolerance (oral): Secondary | ICD-10-CM

## 2021-10-29 DIAGNOSIS — H9193 Unspecified hearing loss, bilateral: Secondary | ICD-10-CM | POA: Insufficient documentation

## 2021-10-29 DIAGNOSIS — E538 Deficiency of other specified B group vitamins: Secondary | ICD-10-CM

## 2021-10-29 DIAGNOSIS — F32A Depression, unspecified: Secondary | ICD-10-CM | POA: Diagnosis not present

## 2021-10-29 DIAGNOSIS — L609 Nail disorder, unspecified: Secondary | ICD-10-CM | POA: Diagnosis not present

## 2021-10-29 DIAGNOSIS — H900 Conductive hearing loss, bilateral: Secondary | ICD-10-CM | POA: Diagnosis not present

## 2021-10-29 DIAGNOSIS — N1831 Chronic kidney disease, stage 3a: Secondary | ICD-10-CM

## 2021-10-29 DIAGNOSIS — E559 Vitamin D deficiency, unspecified: Secondary | ICD-10-CM

## 2021-10-29 DIAGNOSIS — Z0001 Encounter for general adult medical examination with abnormal findings: Secondary | ICD-10-CM | POA: Diagnosis not present

## 2021-10-29 LAB — CBC WITH DIFFERENTIAL/PLATELET
Basophils Absolute: 0.1 10*3/uL (ref 0.0–0.1)
Basophils Relative: 0.9 % (ref 0.0–3.0)
Eosinophils Absolute: 0.1 10*3/uL (ref 0.0–0.7)
Eosinophils Relative: 2.4 % (ref 0.0–5.0)
HCT: 43.6 % (ref 36.0–46.0)
Hemoglobin: 14.1 g/dL (ref 12.0–15.0)
Lymphocytes Relative: 29.8 % (ref 12.0–46.0)
Lymphs Abs: 1.8 10*3/uL (ref 0.7–4.0)
MCHC: 32.2 g/dL (ref 30.0–36.0)
MCV: 100 fl (ref 78.0–100.0)
Monocytes Absolute: 0.6 10*3/uL (ref 0.1–1.0)
Monocytes Relative: 10.3 % (ref 3.0–12.0)
Neutro Abs: 3.4 10*3/uL (ref 1.4–7.7)
Neutrophils Relative %: 56.6 % (ref 43.0–77.0)
Platelets: 209 10*3/uL (ref 150.0–400.0)
RBC: 4.36 Mil/uL (ref 3.87–5.11)
RDW: 16.4 % — ABNORMAL HIGH (ref 11.5–15.5)
WBC: 6 10*3/uL (ref 4.0–10.5)

## 2021-10-29 LAB — BASIC METABOLIC PANEL
BUN: 26 mg/dL — ABNORMAL HIGH (ref 6–23)
CO2: 30 mEq/L (ref 19–32)
Calcium: 9.7 mg/dL (ref 8.4–10.5)
Chloride: 105 mEq/L (ref 96–112)
Creatinine, Ser: 0.84 mg/dL (ref 0.40–1.20)
GFR: 69.36 mL/min (ref >60.00)
Glucose, Bld: 97 mg/dL (ref 70–99)
Potassium: 4.3 mEq/L (ref 3.5–5.1)
Sodium: 142 mEq/L (ref 135–145)

## 2021-10-29 LAB — HEPATIC FUNCTION PANEL
ALT: 14 U/L (ref 0–35)
AST: 15 U/L (ref 0–37)
Albumin: 4.3 g/dL (ref 3.5–5.2)
Alkaline Phosphatase: 44 U/L (ref 39–117)
Bilirubin, Direct: 0 mg/dL (ref 0.0–0.3)
Total Bilirubin: 0.4 mg/dL (ref 0.2–1.2)
Total Protein: 7.3 g/dL (ref 6.0–8.3)

## 2021-10-29 LAB — LIPID PANEL
Cholesterol: 192 mg/dL (ref 0–200)
HDL: 43.1 mg/dL (ref >39.00)
LDL Cholesterol: 116 mg/dL — ABNORMAL HIGH (ref 0–99)
NonHDL: 149.05
Total CHOL/HDL Ratio: 4
Triglycerides: 163 mg/dL — ABNORMAL HIGH (ref 0.0–149.0)
VLDL: 32.6 mg/dL (ref 0.0–40.0)

## 2021-10-29 LAB — TSH: TSH: 2.01 u[IU]/mL (ref 0.35–5.50)

## 2021-10-29 NOTE — Progress Notes (Signed)
Patient ID: CHANIAH CISSE, female   DOB: [birth year], 71 y.o.   MRN: [id number]         Chief Complaint:: wellness exam and Follow-up (F/u from rehab/Check left ear/Requesting podiatry referral/Mental health referral/Ulcer on right ankle/Discuss Adderall and Depakote)         HPI:  ADDYSYN FERN is a 71 y.o. 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  Can walk up to 5 steps with assist and walker currently, has PT starting at home with [facility].  Also has bilateral ear fullness and hearing loss in the past wk over baseline - ? Wax.  Has had moderate worsening depressive symptoms, but no suicidal ideation, or panic; Pt denies chest pain, increased sob or doe, wheezing, orthopnea, PND, increased LE swelling, palpitations, dizziness or syncope. Pt denies polydipsia, polyuria, or new focal neuro s/s.  Has a scabbed over healing wound to lateral right leg just distal to the right knee. Family asks for adderall restart to help with depression but has psychiatric f/u for next wk, needs referral.  Had Video visit with neurology last wk, overall stable per family and ok for adderall post stroke.    Wt Readings from Last 3 Encounters:  08/02/21 133 lb (60.3 kg)  07/27/21 133 lb (60.3 kg)  05/27/21 133 lb (60.3 kg)   BP Readings from Last 3 Encounters:  10/29/21 128/70  08/04/21 140/62  07/27/21 124/75   Immunization History  Administered Date(s) Administered   Influenza-Unspecified 08/18/2021   Moderna Sars-Covid-2 Vaccination 12/02/2019   Tdap 10/19/2012   There are no preventive care reminders to display for this patient.     Past Medical History:  Diagnosis Date   ANXIETY 02/12/2008   Qualifier: Diagnosis of  By: Jenny Reichmann MD, Hunt Oris    DEPRESSION 02/12/2008    Qualifier: Diagnosis of  By: Elveria Royals    HYPERLIPIDEMIA 02/12/2008   Qualifier: Diagnosis of  By: Elveria Royals    HYPERTENSION 02/12/2008   Qualifier: Diagnosis of  By: Elveria Royals    HYPOTHYROIDISM 02/12/2008   Qualifier: Diagnosis of  By: Elveria Royals    Impaired glucose tolerance 08/27/2011   OSTEOPENIA 02/12/2008   Qualifier: Diagnosis of  By: Jenny Reichmann MD, Hunt Oris    VITAMIN D DEFICIENCY 04/14/2010   Qualifier: Diagnosis of  By: Jenny Reichmann MD, Hunt Oris    Past Surgical History:  Procedure Laterality Date   IR RADIOLOGIST EVAL & MGMT  08/18/2021    reports that she quit smoking about 9 months ago. Her smoking use included cigarettes. She has never used smokeless tobacco. She reports current alcohol use. She reports that she does not use drugs. family history includes Bipolar disorder in her sister; Heart disease in her father. Allergies  Allergen Reactions   Other Anaphylaxis    Fire ant venom   Epinephrine    Influenza Vac Split Quad     fatigue   Current Outpatient Medications on File Prior to Visit  Medication Sig Dispense Refill   acetaminophen (TYLENOL) 325 MG tablet Take 2 tablets (650 mg total) by mouth every 6 (six) hours as needed for mild pain or headache (fever >/=  101). 12 tablet 2   ALPRAZolam (XANAX) 0.5 MG tablet Take 1 tablet (0.5 mg total) by mouth in the morning, at noon, and at bedtime. 12 tablet 0   ascorbic acid (VITAMIN C) 500 MG tablet Take 1 tablet (500 mg total) by mouth daily. 30 tablet 2   aspirin EC 81 MG tablet Take 1 tablet (81 mg total) by mouth daily with breakfast. Swallow whole. 30 tablet 11   clopidogrel (PLAVIX) 75 MG tablet Take 1 tablet (75 mg total) by mouth daily. 30 tablet 0   divalproex (DEPAKOTE) 250 MG DR tablet Take 1 tablet by mouth in the morning, at noon, and at bedtime.     levothyroxine (SYNTHROID) 88 MCG tablet Take 1 tablet (88 mcg total) by mouth daily. 90 tablet 1   melatonin 5 MG TABS Take 5  mg by mouth.     memantine (NAMENDA) 5 MG tablet Take 5 mg by mouth 2 (two) times daily.     metoprolol tartrate (LOPRESSOR) 25 MG tablet Take 0.5 tablets (12.5 mg total) by mouth 2 (two) times daily. -Hold if SBP <  100 mmhg or HR < 60 BPM 30 tablet 3   Multiple Vitamin (MULTIVITAMIN) tablet Take 1 tablet by mouth daily.     PARoxetine (PAXIL) 20 MG tablet Take 20 mg by mouth daily.     polyethylene glycol (MIRALAX / GLYCOLAX) 17 g packet Take 17 g by mouth daily as needed for mild constipation. 14 each 0   rosuvastatin (CRESTOR) 5 MG tablet Take 1 tablet (5 mg total) by mouth daily. Hold Crestor while taking Paxlovid 30 tablet 4   vitamin B-12 (CYANOCOBALAMIN) 1000 MCG tablet Take 1 tablet (1,000 mcg total) by mouth daily. 90 tablet 3   valbenazine (INGREZZA) 80 MG capsule Take 80 mg by mouth at bedtime. (Patient not taking: Reported on 10/29/2021)     zinc sulfate 220 (50 Zn) MG capsule Take 1 capsule (220 mg total) by mouth daily. (Patient not taking: Reported on 10/29/2021) 30 capsule 0   No current facility-administered medications on file prior to visit.        ROS:  All others reviewed and negative.  Objective        PE:  BP 128/70 (BP Location: Right Arm, Patient Position: Sitting, Cuff Size: Normal)    Pulse 64    Temp 99.7 F (37.6 C) (Oral)    Ht 5\' 2"  (1.575 m)    SpO2 95%    BMI 24.33 kg/m                 Constitutional: Pt appears in NAD               HENT: Head: NCAT.                Right Ear: External ear normal.                 Left Ear: External ear normal. Bilateral wax impations resolved with irrigation               Eyes: . Pupils are equal, round, and reactive to light. Conjunctivae and EOM are normal               Nose: without d/c or deformity               Neck: Neck supple. Gross normal ROM               Cardiovascular: Normal rate and  regular rhythm.                 Pulmonary/Chest: Effort normal and breath sounds without rales or wheezing.                Abd:   Soft, NT, ND, + BS, no organomegaly               Neurological: Pt is alert. At baseline orientation, motor grossly intact except for LUE 3/5, LLE 4/5 motor               Skin: Skin is warm. No rashes, no other new lesions, LE edema - none               Psychiatric: Pt behavior is normal without agitation   Micro: none  Cardiac tracings I have personally interpreted today:  none  Pertinent Radiological findings (summarize): none   Lab Results  Component Value Date   WBC 7.2 08/04/2021   HGB 12.4 08/04/2021   HCT 37.8 08/04/2021   PLT 225 08/04/2021   GLUCOSE 94 08/04/2021   CHOL 184 01/27/2021   TRIG 172 (H) 05/04/2021   HDL 56 01/27/2021   LDLDIRECT 137.5 10/19/2012   LDLCALC 104 (H) 01/27/2021   ALT 10 08/04/2021   AST 10 (L) 08/04/2021   NA 143 08/04/2021   K 4.2 08/04/2021   CL 109 08/04/2021   CREATININE 0.93 08/04/2021   BUN 22 08/04/2021   CO2 28 08/04/2021   TSH 1.402 08/03/2021   INR 0.9 02/01/2021   HGBA1C 6.4 (H) 01/27/2021   Assessment/Plan:  DONINIQUE LWIN is a 71 y.o. White or Caucasian [1] female with  has a past medical history of ANXIETY (02/12/2008), DEPRESSION (02/12/2008), HYPERLIPIDEMIA (02/12/2008), HYPERTENSION (02/12/2008), HYPOTHYROIDISM (02/12/2008), Impaired glucose tolerance (08/27/2011), OSTEOPENIA (02/12/2008), and VITAMIN D DEFICIENCY (04/14/2010).  Encounter for well adult exam with abnormal findings Age and sex appropriate education and counseling updated with regular exercise and diet Referrals for preventative services - declines mamogram, dxa, colonoscopy for now Immunizations addressed - declines shingirx, pneumovax Smoking counseling  - none needed Evidence for depression or other mood disorder - worsening depression Most recent labs reviewed. I have personally reviewed and have noted: 1) the patient's medical and social history 2) The patient's current medications and supplements 3) The patient's height, weight, and BMI have been  recorded in the chart   B12 deficiency Lab Results  Component Value Date   VITAMINB12 1,196 (H) 05/05/2021   Stable, cont oral replacement - b12 1000 mcg qd   CKD (chronic kidney disease) Lab Results  Component Value Date   CREATININE 0.93 08/04/2021   Stable overall, cont to avoid nephrotoxins, for f/u lab today  Depression Uncontrolled recent mild worsening, for psychiatric referral, for adderall question to be resolved there  Essential hypertension BP Readings from Last 3 Encounters:  10/29/21 128/70  08/04/21 140/62  07/27/21 124/75   Stable, pt to continue medical treatment lopressor   HLD (hyperlipidemia) Lab Results  Component Value Date   LDLCALC 104 (H) 01/27/2021   Uncontrolled, goal ldl < 70, pt to continue current statin crestor , low chol diet f/u lab today   Impaired glucose tolerance Lab Results  Component Value Date   HGBA1C 6.4 (H) 01/27/2021   Stable, pt to continue current medical treatment  - diet   Vitamin D deficiency Last vitamin D Lab Results  Component Value Date   VD25OH 21.70 (L) 01/27/2021   Low to start oral replacement  Bilateral hearing loss Improved with wax impaction irriagation  Ceruminosis is noted.  Wax is removed by syringing and manual debridement. Instructions for home care to prevent wax buildup are given.   History of stroke With left hemiparesis, ow stable, cont statin, asa, plavix  Nail disorder With recent worsening, for podiatry referral  Followup: No follow-ups on file.  Cathlean Cower, MD 10/29/2021 9:17 PM [facility]

## 2021-10-29 NOTE — Assessment & Plan Note (Addendum)
With left hemiparesis, ow stable, cont statin, asa, plavix

## 2021-10-29 NOTE — Assessment & Plan Note (Signed)
Lab Results  Component Value Date   VITAMINB12 1,196 (H) 05/05/2021   Stable, cont oral replacement - b12 1000 mcg qd

## 2021-10-29 NOTE — Assessment & Plan Note (Signed)
Age and sex appropriate education and counseling updated with regular exercise and diet Referrals for preventative services - declines mamogram, dxa, colonoscopy for now Immunizations addressed - declines shingirx, pneumovax Smoking counseling  - none needed Evidence for depression or other mood disorder - worsening depression Most recent labs reviewed. I have personally reviewed and have noted: 1) the patient's medical and social history 2) The patient's current medications and supplements 3) The patient's height, weight, and BMI have been recorded in the chart

## 2021-10-29 NOTE — Patient Instructions (Addendum)
Your ears were irrigated of wax today  You will be contacted regarding the referral for: podiatry and psychiatry  Please continue all other medications as before, and refills have been done if requested.  Please have the pharmacy call with any other refills you may need.  Please continue your efforts at being more active, low cholesterol diet, and weight control.  You are otherwise up to date with prevention measures today.  Please keep your appointments with your specialists as you may have planned  Please go to the LAB at the blood drawing area for the tests to be done  You will be contacted by phone if any changes need to be made immediately.  Otherwise, you will receive a letter about your results with an explanation, but please check with MyChart first.  Please remember to sign up for MyChart if you have not done so, as this will be important to you in the future with finding out test results, communicating by private email, and scheduling acute appointments online when needed.  Please make an Appointment to return in 4 months, or sooner if needed

## 2021-10-29 NOTE — Assessment & Plan Note (Signed)
BP Readings from Last 3 Encounters:  10/29/21 128/70  08/04/21 140/62  07/27/21 124/75   Stable, pt to continue medical treatment lopressor

## 2021-10-29 NOTE — Assessment & Plan Note (Signed)
Lab Results  Component Value Date   CREATININE 0.93 08/04/2021   Stable overall, cont to avoid nephrotoxins, for f/u lab today

## 2021-10-29 NOTE — Assessment & Plan Note (Signed)
Last vitamin D Lab Results  Component Value Date   VD25OH 21.70 (L) 01/27/2021   Low to start oral replacement

## 2021-10-29 NOTE — Assessment & Plan Note (Signed)
Lab Results  Component Value Date   HGBA1C 6.4 (H) 01/27/2021   Stable, pt to continue current medical treatment  - diet

## 2021-10-29 NOTE — Assessment & Plan Note (Signed)
Improved with wax impaction irriagation  Ceruminosis is noted.  Wax is removed by syringing and manual debridement. Instructions for home care to prevent wax buildup are given.

## 2021-10-29 NOTE — Assessment & Plan Note (Addendum)
Lab Results  Component Value Date   LDLCALC 104 (H) 01/27/2021   Uncontrolled, goal ldl < 70, pt to continue current statin crestor , low chol diet f/u lab today

## 2021-10-29 NOTE — Assessment & Plan Note (Signed)
With recent worsening, for podiatry referral

## 2021-10-29 NOTE — Assessment & Plan Note (Signed)
Uncontrolled recent mild worsening, for psychiatric referral, for adderall question to be resolved there

## 2021-11-01 ENCOUNTER — Other Ambulatory Visit: Payer: Self-pay

## 2021-11-01 ENCOUNTER — Other Ambulatory Visit (INDEPENDENT_AMBULATORY_CARE_PROVIDER_SITE_OTHER): Payer: Medicare HMO

## 2021-11-01 ENCOUNTER — Telehealth: Payer: Self-pay | Admitting: Internal Medicine

## 2021-11-01 ENCOUNTER — Other Ambulatory Visit: Payer: Self-pay | Admitting: Internal Medicine

## 2021-11-01 DIAGNOSIS — E782 Mixed hyperlipidemia: Secondary | ICD-10-CM

## 2021-11-01 LAB — URINALYSIS, ROUTINE W REFLEX MICROSCOPIC
Bilirubin Urine: NEGATIVE
Hgb urine dipstick: NEGATIVE
Ketones, ur: NEGATIVE
Nitrite: NEGATIVE
Specific Gravity, Urine: 1.025 (ref 1.000–1.030)
Total Protein, Urine: NEGATIVE
Urine Glucose: NEGATIVE
Urobilinogen, UA: 0.2 (ref 0.0–1.0)
pH: 6 (ref 5.0–8.0)

## 2021-11-01 LAB — VITAMIN B12: Vitamin B-12: >1504 pg/mL — ABNORMAL HIGH (ref 211–911)

## 2021-11-01 LAB — VITAMIN D 25 HYDROXY (VIT D DEFICIENCY, FRACTURES): VITD: 51.79 ng/mL (ref 30.00–100.00)

## 2021-11-01 LAB — HEMOGLOBIN A1C: Hgb A1c MFr Bld: 6.1 % (ref 4.6–6.5)

## 2021-11-01 MED ORDER — LEVOTHYROXINE SODIUM 88 MCG PO TABS: 88.0000 ug | ORAL_TABLET | Freq: Every day | ORAL | 1 refills | Status: DC

## 2021-11-01 MED ORDER — CEPHALEXIN 500 MG PO CAPS: 500.0000 mg | ORAL_CAPSULE | Freq: Three times a day (TID) | ORAL | 0 refills | Status: DC

## 2021-11-01 MED ORDER — ROSUVASTATIN CALCIUM 5 MG PO TABS: 5.0000 mg | ORAL_TABLET | Freq: Every day | ORAL | 4 refills | Status: DC

## 2021-11-01 NOTE — Telephone Encounter (Signed)
1.Medication Requested: levothyroxine (SYNTHROID) 88 MCG tablet rosuvastatin (CRESTOR) 5 MG tablet 2. Pharmacy (Name, Street, [facility]): [facility] [place], [place] - [address] [place] Phone:  [phone]  Fax:  [phone]     3. On Med List: Y  4. Last Visit with PCP:   5. Next visit date with PCP:   Agent: Please be advised that RX refills may take up to 3 business days. We ask that you follow-up with your pharmacy.

## 2021-11-02 DIAGNOSIS — F902 Attention-deficit hyperactivity disorder, combined type: Secondary | ICD-10-CM | POA: Diagnosis not present

## 2021-11-02 DIAGNOSIS — F3181 Bipolar II disorder: Secondary | ICD-10-CM | POA: Diagnosis not present

## 2021-11-02 DIAGNOSIS — F429 Obsessive-compulsive disorder, unspecified: Secondary | ICD-10-CM | POA: Diagnosis not present

## 2021-11-11 ENCOUNTER — Telehealth: Payer: Self-pay

## 2021-11-11 NOTE — Telephone Encounter (Signed)
Pt husband is wondering what medications that she should be taking and what should be D/C from discharging from Rehab.  Pt states that these medications have ran out: clopidogrel (PLAVIX) 75 MG tablet memantine (NAMENDA) 5 MG tablet And if she should continue to take them she would need refills.  Please advise [name] (pt husband) [phone]  Pharmacy: [facility] [place], [place] - [address] AT [address]

## 2021-11-12 MED ORDER — CLOPIDOGREL BISULFATE 75 MG PO TABS: 75.0000 mg | ORAL_TABLET | Freq: Every day | ORAL | 3 refills | Status: DC

## 2021-11-12 MED ORDER — MEMANTINE HCL 5 MG PO TABS: 5.0000 mg | ORAL_TABLET | Freq: Two times a day (BID) | ORAL | 3 refills | Status: DC

## 2021-11-12 NOTE — Telephone Encounter (Signed)
Should patient still be taking Clopidogrel and Namenda?

## 2021-11-12 NOTE — Telephone Encounter (Signed)
Ok both sent to [facility]

## 2021-11-24 ENCOUNTER — Telehealth: Payer: Self-pay | Admitting: Internal Medicine

## 2021-11-24 NOTE — Telephone Encounter (Signed)
Vinnie Level with [facility] calls today in regards to PT. PT has had increased spouts of lethargy and weakness. These symptoms showing up in PT mostly after walking and will continue for several days after. PT has also been dealing with the passing of blood through her urine. Symptoms seem to have been very abrupt compared to her mood and health several weeks ago.  CB: [phone]

## 2021-11-24 NOTE — Telephone Encounter (Signed)
Called and left a message on patient voice mail for her to schedule an appt with Dr. Jenny Reichmann at his first availability

## 2021-11-29 ENCOUNTER — Other Ambulatory Visit: Payer: Self-pay | Admitting: Internal Medicine

## 2021-11-29 ENCOUNTER — Other Ambulatory Visit: Payer: Self-pay

## 2021-11-29 ENCOUNTER — Ambulatory Visit (INDEPENDENT_AMBULATORY_CARE_PROVIDER_SITE_OTHER): Payer: Medicare HMO | Admitting: Internal Medicine

## 2021-11-29 ENCOUNTER — Encounter: Payer: Self-pay | Admitting: Internal Medicine

## 2021-11-29 VITALS — BP 110/60 | HR 44 | Ht 62.0 in

## 2021-11-29 DIAGNOSIS — R400 Somnolence: Secondary | ICD-10-CM

## 2021-11-29 DIAGNOSIS — E559 Vitamin D deficiency, unspecified: Secondary | ICD-10-CM | POA: Diagnosis not present

## 2021-11-29 DIAGNOSIS — R350 Frequency of micturition: Secondary | ICD-10-CM | POA: Diagnosis not present

## 2021-11-29 DIAGNOSIS — R310 Gross hematuria: Secondary | ICD-10-CM | POA: Diagnosis not present

## 2021-11-29 DIAGNOSIS — R7302 Impaired glucose tolerance (oral): Secondary | ICD-10-CM | POA: Diagnosis not present

## 2021-11-29 LAB — URINALYSIS, ROUTINE W REFLEX MICROSCOPIC
Bilirubin Urine: NEGATIVE
Hgb urine dipstick: NEGATIVE
Ketones, ur: NEGATIVE
Nitrite: NEGATIVE
Specific Gravity, Urine: 1.025 (ref 1.000–1.030)
Total Protein, Urine: NEGATIVE
Urine Glucose: NEGATIVE
Urobilinogen, UA: 0.2 (ref 0.0–1.0)
pH: 6 (ref 5.0–8.0)

## 2021-11-29 MED ORDER — CIPROFLOXACIN HCL 500 MG PO TABS: 500.0000 mg | ORAL_TABLET | Freq: Two times a day (BID) | ORAL | 0 refills | Status: AC

## 2021-11-29 NOTE — Progress Notes (Deleted)
Patient ID: Susan Davidson, female   DOB: September 20, 1951, 71 y.o.   MRN: [id number]         Chief Complaint:: wellness exam and Follow-up  ***       HPI:  Susan Davidson is a 71 y.o. female here for wellness exam                        Also***   Wt Readings from Last 3 Encounters:  08/02/21 133 lb (60.3 kg)  07/27/21 133 lb (60.3 kg)  05/27/21 133 lb (60.3 kg)   BP Readings from Last 3 Encounters:  11/29/21 110/60  10/29/21 128/70  08/04/21 140/62   Immunization History  Administered Date(s) Administered   Influenza-Unspecified 08/18/2021   Moderna Sars-Covid-2 Vaccination 12/02/2019   Tdap 10/19/2012  There are no preventive care reminders to display for this patient.    Past Medical History:  Diagnosis Date   ANXIETY 02/12/2008   Qualifier: Diagnosis of  By: Jenny Reichmann MD, Hunt Oris    DEPRESSION 02/12/2008   Qualifier: Diagnosis of  By: Elveria Royals    HYPERLIPIDEMIA 02/12/2008   Qualifier: Diagnosis of  By: Elveria Royals    HYPERTENSION 02/12/2008   Qualifier: Diagnosis of  By: Elveria Royals    HYPOTHYROIDISM 02/12/2008   Qualifier: Diagnosis of  By: Elveria Royals    Impaired glucose tolerance 08/27/2011   OSTEOPENIA 02/12/2008   Qualifier: Diagnosis of  By: Jenny Reichmann MD, Hunt Oris    VITAMIN D DEFICIENCY 04/14/2010   Qualifier: Diagnosis of  By: Jenny Reichmann MD, Hunt Oris    Past Surgical History:  Procedure Laterality Date   IR RADIOLOGIST EVAL & MGMT  08/18/2021    reports that she quit smoking about 10 months ago. Her smoking use included cigarettes. She has never used smokeless tobacco. She reports current alcohol use. She reports that she does not use drugs. family history includes Bipolar disorder in her sister; Heart disease in her father. Allergies  Allergen Reactions   Other Anaphylaxis    Fire ant venom   Epinephrine    Influenza Vac Split Quad     fatigue   Current Outpatient Medications on File Prior to Visit  Medication Sig Dispense  Refill   acetaminophen (TYLENOL) 325 MG tablet Take 2 tablets (650 mg total) by mouth every 6 (six) hours as needed for mild pain or headache (fever >/= 101). 12 tablet 2   ALPRAZolam (XANAX) 0.5 MG tablet Take 1 tablet (0.5 mg total) by mouth in the morning, at noon, and at bedtime. 12 tablet 0   ascorbic acid (VITAMIN C) 500 MG tablet Take 1 tablet (500 mg total) by mouth daily. 30 tablet 2   aspirin EC 81 MG tablet Take 1 tablet (81 mg total) by mouth daily with breakfast. Swallow whole. 30 tablet 11   clopidogrel (PLAVIX) 75 MG tablet Take 1 tablet (75 mg total) by mouth daily. 90 tablet 3   divalproex (DEPAKOTE) 250 MG DR tablet Take 1 tablet by mouth in the morning, at noon, and at bedtime.     levothyroxine (SYNTHROID) 88 MCG tablet Take 1 tablet (88 mcg total) by mouth daily. 90 tablet 1   melatonin 5 MG TABS Take 5 mg by mouth.     memantine (NAMENDA) 5 MG tablet Take 1 tablet (5 mg total) by mouth 2 (two) times daily. 180 tablet 3   metoprolol tartrate (LOPRESSOR) 25 MG tablet Take 0.5 tablets (12.5  mg total) by mouth 2 (two) times daily. -Hold if SBP <  100 mmhg or HR < 60 BPM 30 tablet 3   Multiple Vitamin (MULTIVITAMIN) tablet Take 1 tablet by mouth daily.     PARoxetine (PAXIL) 20 MG tablet Take 20 mg by mouth daily.     polyethylene glycol (MIRALAX / GLYCOLAX) 17 g packet Take 17 g by mouth daily as needed for mild constipation. 14 each 0   rosuvastatin (CRESTOR) 5 MG tablet Take 1 tablet (5 mg total) by mouth daily. Hold Crestor while taking Paxlovid 30 tablet 4   vitamin B-12 (CYANOCOBALAMIN) 1000 MCG tablet Take 1 tablet (1,000 mcg total) by mouth daily. 90 tablet 3   zinc sulfate 220 (50 Zn) MG capsule Take 1 capsule (220 mg total) by mouth daily. (Patient not taking: Reported on 10/29/2021) 30 capsule 0   No current facility-administered medications on file prior to visit.        ROS:  All others reviewed and negative.  Objective        PE:  BP 110/60 (BP Location: Right  Arm, Patient Position: Sitting, Cuff Size: Normal)    Pulse (!) 44    Ht 5\' 2"  (1.575 m)    SpO2 100%    BMI 24.33 kg/m                 Constitutional: Pt appears in NAD               HENT: Head: NCAT.                Right Ear: External ear normal.                 Left Ear: External ear normal.                Eyes: . Pupils are equal, round, and reactive to light. Conjunctivae and EOM are normal               Nose: without d/c or deformity               Neck: Neck supple. Gross normal ROM               Cardiovascular: Normal rate and regular rhythm.                 Pulmonary/Chest: Effort normal and breath sounds without rales or wheezing.                Abd:  Soft, NT, ND, + BS, no organomegaly               Neurological: Pt is alert. At baseline orientation, motor grossly intact               Skin: Skin is warm. No rashes, no other new lesions, LE edema - ***               Psychiatric: Pt behavior is normal without agitation   Micro: none  Cardiac tracings I have personally interpreted today:  none  Pertinent Radiological findings (summarize): none   Lab Results  Component Value Date   WBC 6.0 10/29/2021   HGB 14.1 10/29/2021   HCT 43.6 10/29/2021   PLT 209.0 10/29/2021   GLUCOSE 97 10/29/2021   CHOL 192 10/29/2021   TRIG 163.0 (H) 10/29/2021   HDL 43.10 10/29/2021   LDLDIRECT 137.5 10/19/2012   LDLCALC 116 (H) 10/29/2021   ALT 14 10/29/2021   AST  15 10/29/2021   NA 142 10/29/2021   K 4.3 10/29/2021   CL 105 10/29/2021   CREATININE 0.84 10/29/2021   BUN 26 (H) 10/29/2021   CO2 30 10/29/2021   TSH 2.01 10/29/2021   INR 0.9 02/01/2021   HGBA1C 6.1 10/29/2021   Assessment/Plan:  Susan Davidson is a 71 y.o. White or Caucasian [1] female with  has a past medical history of ANXIETY (02/12/2008), DEPRESSION (02/12/2008), HYPERLIPIDEMIA (02/12/2008), HYPERTENSION (02/12/2008), HYPOTHYROIDISM (02/12/2008), Impaired glucose tolerance (08/27/2011), OSTEOPENIA (02/12/2008), and  VITAMIN D DEFICIENCY (04/14/2010).  No problem-specific Assessment & Plan notes found for this encounter.  Followup: No follow-ups on file.  Cathlean Cower, MD 11/29/2021 10:27 AM [facility]

## 2021-11-29 NOTE — Assessment & Plan Note (Signed)
Mild to mod, for ua and culture,  to f/u any worsening symptoms or concerns

## 2021-11-29 NOTE — Assessment & Plan Note (Signed)
Worsening likely due to possible UTI I suspect, but also ok to take the adderall as rx per psychiatry, son reassured

## 2021-11-29 NOTE — Patient Instructions (Signed)
Ok to take the full dose adderall as prescribed per Dr Reece Levy  Please continue all other medications as before, and refills have been done if requested.  Please have the pharmacy call with any other refills you may need.  Please continue your efforts at being more active, low cholesterol diet, and weight control.  Please keep your appointments with your specialists as you may have planned  Please go to the LAB at the blood drawing area for the tests to be done - just the urine testing today  You will be contacted by phone if any changes need to be made immediately.  Otherwise, you will receive a letter about your results with an explanation, but please check with MyChart first.  Please remember to sign up for MyChart if you have not done so, as this will be important to you in the future with finding out test results, communicating by private email, and scheduling acute appointments online when needed.

## 2021-11-29 NOTE — Assessment & Plan Note (Signed)
Last vitamin D Lab Results  Component Value Date   VD25OH 51.79 10/29/2021   Stable, cont oral replacement

## 2021-11-29 NOTE — Assessment & Plan Note (Signed)
Lab Results  Component Value Date   HGBA1C 6.1 10/29/2021   Stable, pt to continue current medical treatment   - diet

## 2021-11-29 NOTE — Progress Notes (Signed)
Patient ID: AMBERLE LYTER, female   DOB: 01-30-1951, 71 y.o.   MRN: [id number]        Chief Complaint: follow up with son with urinary symptoms       HPI:  NEVEEN DAPONTE is a 71 y.o. female here with c/o 1 wk onset worsening urinary frequency and one episode small gross hematuria, bu Denies urinary symptoms such as frequency, urgency, flank pain, or n/v, fever, chills. Pt denies chest pain, increased sob or doe, wheezing, orthopnea, PND, increased LE swelling, palpitations, dizziness or syncope.   Pt denies polydipsia, polyuria, or new focal neuro s/s.  Has been sleeping more in the past wk, and not able to participate with OT as well so she is here   Also rx with adderal per psych but son wary of giving her the full dose as prescribed given hx of stroke, but admits neurology stated was ok to take.       Wt Readings from Last 3 Encounters:  08/02/21 133 lb (60.3 kg)  07/27/21 133 lb (60.3 kg)  05/27/21 133 lb (60.3 kg)   BP Readings from Last 3 Encounters:  11/29/21 110/60  10/29/21 128/70  08/04/21 140/62         Past Medical History:  Diagnosis Date   ANXIETY 02/12/2008   Qualifier: Diagnosis of  By: Jenny Reichmann MD, Hunt Oris    DEPRESSION 02/12/2008   Qualifier: Diagnosis of  By: Elveria Royals    HYPERLIPIDEMIA 02/12/2008   Qualifier: Diagnosis of  By: Elveria Royals    HYPERTENSION 02/12/2008   Qualifier: Diagnosis of  By: [place], [place] 02/12/2008   Qualifier: Diagnosis of  By: Elveria Royals    Impaired glucose tolerance 08/27/2011   OSTEOPENIA 02/12/2008   Qualifier: Diagnosis of  By: Jenny Reichmann MD, Hunt Oris    VITAMIN D DEFICIENCY 04/14/2010   Qualifier: Diagnosis of  By: Jenny Reichmann MD, Hunt Oris    Past Surgical History:  Procedure Laterality Date   IR RADIOLOGIST EVAL & MGMT  08/18/2021    reports that she quit smoking about 10 months ago. Her smoking use included cigarettes. She has never used smokeless tobacco. She reports current alcohol  use. She reports that she does not use drugs. family history includes Bipolar disorder in her sister; Heart disease in her father. Allergies  Allergen Reactions   Other Anaphylaxis    Fire ant venom   Epinephrine    Influenza Vac Split Quad     fatigue   Current Outpatient Medications on File Prior to Visit  Medication Sig Dispense Refill   acetaminophen (TYLENOL) 325 MG tablet Take 2 tablets (650 mg total) by mouth every 6 (six) hours as needed for mild pain or headache (fever >/= 101). 12 tablet 2   ALPRAZolam (XANAX) 0.5 MG tablet Take 1 tablet (0.5 mg total) by mouth in the morning, at noon, and at bedtime. 12 tablet 0   ascorbic acid (VITAMIN C) 500 MG tablet Take 1 tablet (500 mg total) by mouth daily. 30 tablet 2   aspirin EC 81 MG tablet Take 1 tablet (81 mg total) by mouth daily with breakfast. Swallow whole. 30 tablet 11   clopidogrel (PLAVIX) 75 MG tablet Take 1 tablet (75 mg total) by mouth daily. 90 tablet 3   divalproex (DEPAKOTE) 250 MG DR tablet Take 1 tablet by mouth in the morning, at noon, and at bedtime.     levothyroxine (SYNTHROID) 88 MCG tablet Take  1 tablet (88 mcg total) by mouth daily. 90 tablet 1   melatonin 5 MG TABS Take 5 mg by mouth.     memantine (NAMENDA) 5 MG tablet Take 1 tablet (5 mg total) by mouth 2 (two) times daily. 180 tablet 3   metoprolol tartrate (LOPRESSOR) 25 MG tablet Take 0.5 tablets (12.5 mg total) by mouth 2 (two) times daily. -Hold if SBP <  100 mmhg or HR < 60 BPM 30 tablet 3   Multiple Vitamin (MULTIVITAMIN) tablet Take 1 tablet by mouth daily.     PARoxetine (PAXIL) 20 MG tablet Take 20 mg by mouth daily.     polyethylene glycol (MIRALAX / GLYCOLAX) 17 g packet Take 17 g by mouth daily as needed for mild constipation. 14 each 0   rosuvastatin (CRESTOR) 5 MG tablet Take 1 tablet (5 mg total) by mouth daily. Hold Crestor while taking Paxlovid 30 tablet 4   vitamin B-12 (CYANOCOBALAMIN) 1000 MCG tablet Take 1 tablet (1,000 mcg total) by  mouth daily. 90 tablet 3   zinc sulfate 220 (50 Zn) MG capsule Take 1 capsule (220 mg total) by mouth daily. (Patient not taking: Reported on 10/29/2021) 30 capsule 0   No current facility-administered medications on file prior to visit.        ROS:  All others reviewed and negative.  Objective        PE:  BP 110/60 (BP Location: Right Arm, Patient Position: Sitting, Cuff Size: Normal)    Pulse (!) 44    Ht 5\' 2"  (1.575 m)    SpO2 100%    BMI 24.33 kg/m                 Constitutional: Pt appears in NAD               HENT: Head: NCAT.                Right Ear: External ear normal.                 Left Ear: External ear normal.                Eyes: . Pupils are equal, round, and reactive to light. Conjunctivae and EOM are normal               Nose: without d/c or deformity               Neck: Neck supple. Gross normal ROM               Cardiovascular: Normal rate and regular rhythm.                 Pulmonary/Chest: Effort normal and breath sounds without rales or wheezing.                Abd:  Soft, NT, ND, + BS, no organomegaly               Neurological: Pt is alert. At baseline orientation, motor grossly intact               Skin: Skin is warm. No rashes, no other new lesions, LE edema - none               Psychiatric: Pt behavior is normal without agitation   Micro: none  Cardiac tracings I have personally interpreted today:  none  Pertinent Radiological findings (summarize): none   Lab Results  Component Value Date  WBC 6.0 10/29/2021   HGB 14.1 10/29/2021   HCT 43.6 10/29/2021   PLT 209.0 10/29/2021   GLUCOSE 97 10/29/2021   CHOL 192 10/29/2021   TRIG 163.0 (H) 10/29/2021   HDL 43.10 10/29/2021   LDLDIRECT 137.5 10/19/2012   LDLCALC 116 (H) 10/29/2021   ALT 14 10/29/2021   AST 15 10/29/2021   NA 142 10/29/2021   K 4.3 10/29/2021   CL 105 10/29/2021   CREATININE 0.84 10/29/2021   BUN 26 (H) 10/29/2021   CO2 30 10/29/2021   TSH 2.01 10/29/2021   INR 0.9  02/01/2021   HGBA1C 6.1 10/29/2021   Assessment/Plan:  REIANNA BATDORF is a 71 y.o. White or Caucasian [1] female with  has a past medical history of ANXIETY (02/12/2008), DEPRESSION (02/12/2008), HYPERLIPIDEMIA (02/12/2008), HYPERTENSION (02/12/2008), HYPOTHYROIDISM (02/12/2008), Impaired glucose tolerance (08/27/2011), OSTEOPENIA (02/12/2008), and VITAMIN D DEFICIENCY (04/14/2010).  Urinary frequency Mild to mod, for ua and culture,  to f/u any worsening symptoms or concerns  Daytime somnolence Worsening likely due to possible UTI I suspect, but also ok to take the adderall as rx per psychiatry, son reassured  Vitamin D deficiency Last vitamin D Lab Results  Component Value Date   VD25OH 51.79 10/29/2021   Stable, cont oral replacement   Impaired glucose tolerance Lab Results  Component Value Date   HGBA1C 6.1 10/29/2021   Stable, pt to continue current medical treatment   - diet  Followup: Return if symptoms worsen or fail to improve.  Cathlean Cower, MD 11/29/2021 8:59 PM [facility]

## 2021-11-30 ENCOUNTER — Ambulatory Visit (INDEPENDENT_AMBULATORY_CARE_PROVIDER_SITE_OTHER): Payer: Medicare HMO | Admitting: "Endocrinology

## 2021-11-30 ENCOUNTER — Encounter: Payer: Self-pay | Admitting: "Endocrinology

## 2021-11-30 VITALS — BP 125/73 | HR 58 | Ht 62.0 in

## 2021-11-30 DIAGNOSIS — E890 Postprocedural hypothyroidism: Secondary | ICD-10-CM

## 2021-11-30 LAB — URINE CULTURE

## 2021-11-30 MED ORDER — LEVOTHYROXINE SODIUM 88 MCG PO TABS: 88.0000 ug | ORAL_TABLET | Freq: Every day | ORAL | 1 refills | Status: DC

## 2021-11-30 NOTE — Progress Notes (Signed)
Endocrinology Follow Up Note                                         11/30/2021, 4:04 PM  Subjective:   Subjective    Susan Davidson is a 71 y.o.-year-old female patient being seen in follow up after being seen in consultation for hypothyroidism referred by Biagio Borg, MD.   Past Medical History:  Diagnosis Date   ANXIETY 02/12/2008   Qualifier: Diagnosis of  By: Jenny Reichmann MD, Hunt Oris    DEPRESSION 02/12/2008   Qualifier: Diagnosis of  By: Sherwood, [place] 02/12/2008   Qualifier: Diagnosis of  By: Elveria Royals    HYPERTENSION 02/12/2008   Qualifier: Diagnosis of  By: Sherwood, [place] 02/12/2008   Qualifier: Diagnosis of  By: Elveria Royals    Impaired glucose tolerance 08/27/2011   OSTEOPENIA 02/12/2008   Qualifier: Diagnosis of  By: Jenny Reichmann MD, Hunt Oris    VITAMIN D DEFICIENCY 04/14/2010   Qualifier: Diagnosis of  By: Jenny Reichmann MD, Hunt Oris     Past Surgical History:  Procedure Laterality Date   IR RADIOLOGIST EVAL & MGMT  08/18/2021    Social History   Socioeconomic History   Marital status: Married    Spouse name: Not on file   Number of children: Not on file   Years of education: Not on file   Highest education level: Not on file  Occupational History   Not on file  Tobacco Use   Smoking status: Former    Types: Cigarettes    Quit date: 01/26/2021    Years since quitting: 0.8   Smokeless tobacco: Never  Vaping Use   Vaping Use: Never used  Substance and Sexual Activity   Alcohol use: Yes   Drug use: No   Sexual activity: Not on file  Other Topics Concern   Not on file  Social History Narrative   Not on file   Social Determinants of Health   Financial Resource Strain: Not on file  Food Insecurity: Not on file  Transportation Needs: Not on file  Physical Activity: Not on file  Stress: Not on file  Social Connections: Not on file     Family History  Problem Relation Age of Onset   Heart disease Father    Bipolar disorder Sister    Diabetes Neg Hx     Outpatient Encounter Medications as of 11/30/2021  Medication Sig   acetaminophen (TYLENOL) 325 MG tablet Take 2 tablets (650 mg total) by mouth every 6 (six) hours as needed for mild pain or headache (fever >/= 101).   ALPRAZolam (XANAX) 0.5 MG tablet Take 1 tablet (0.5 mg total) by mouth in the morning, at noon, and at bedtime.   ascorbic acid (VITAMIN C) 500 MG tablet Take 1 tablet (500 mg total) by mouth daily.   aspirin EC 81 MG tablet Take 1 tablet (81 mg total) by mouth daily with breakfast. Swallow  whole.   ciprofloxacin (CIPRO) 500 MG tablet Take 1 tablet (500 mg total) by mouth 2 (two) times daily for 10 days.   clopidogrel (PLAVIX) 75 MG tablet Take 1 tablet (75 mg total) by mouth daily.   divalproex (DEPAKOTE) 250 MG DR tablet Take 1 tablet by mouth in the morning, at noon, and at bedtime.   melatonin 5 MG TABS Take 5 mg by mouth.   memantine (NAMENDA) 5 MG tablet Take 1 tablet (5 mg total) by mouth 2 (two) times daily.   metoprolol tartrate (LOPRESSOR) 25 MG tablet Take 0.5 tablets (12.5 mg total) by mouth 2 (two) times daily. -Hold if SBP <  100 mmhg or HR < 60 BPM   Multiple Vitamin (MULTIVITAMIN) tablet Take 1 tablet by mouth daily.   PARoxetine (PAXIL) 20 MG tablet Take 20 mg by mouth daily.   polyethylene glycol (MIRALAX / GLYCOLAX) 17 g packet Take 17 g by mouth daily as needed for mild constipation.   rosuvastatin (CRESTOR) 5 MG tablet Take 1 tablet (5 mg total) by mouth daily. Hold Crestor while taking Paxlovid   vitamin B-12 (CYANOCOBALAMIN) 1000 MCG tablet Take 1 tablet (1,000 mcg total) by mouth daily.   zinc sulfate 220 (50 Zn) MG capsule Take 1 capsule (220 mg total) by mouth daily.   [DISCONTINUED] levothyroxine (SYNTHROID) 88 MCG tablet Take 1 tablet (88 mcg total) by mouth daily.   levothyroxine (SYNTHROID) 88 MCG tablet Take 1 tablet (88  mcg total) by mouth daily.   No facility-administered encounter medications on file as of 11/30/2021.    ALLERGIES: Allergies  Allergen Reactions   Other Anaphylaxis    Fire ant venom   Epinephrine    Influenza Vac Split Quad     fatigue   VACCINATION STATUS: Immunization History  Administered Date(s) Administered   Influenza-Unspecified 08/18/2021   Moderna Sars-Covid-2 Vaccination 12/02/2019   Tdap 10/19/2012     HPI   Susan Davidson  is a patient who resides in a nursing home with the above medical history. she was diagnosed with hyperthyroidism at approximate age of 56 years, which required RAI ablation and subsequent initiation of thyroid hormone replacement. she was given various doses of Levothyroxine over the years, currently on 88 mcg po daily before breakfast.  She has been taking this medication consistently.  Her previsit thyroid function tests are consistent with appropriate replacement.  She has no new complaints.  She remains wheelchair-bound.   Pt denies feeling nodules in neck, hoarseness, dysphagia/odynophagia, SOB with lying down.  she denies family history of thyroid disorders.  No family history of thyroid cancer.   No recent use of iodine supplements.  Denies use of Biotin containing supplements.  She has never had any previous imaging of her thyroid in the past.  I reviewed her chart and she also has a history of CVA, encephalopathy, CKD, depression, HLD, Osteopenia, vitamin D deficiency.   ROS:  Constitutional: no weight gain/loss, + fatigue-improved, no subjective hyperthermia, + subjective hypothermia-improved Eyes: no blurry vision, no xerophthalmia ENT: no sore throat, no nodules palpated in throat, no dysphagia/odynophagia, no hoarseness Cardiovascular: no chest pain, no SOB, no palpitations, no leg swelling Respiratory: no cough, no SOB Gastrointestinal: no nausea/vomiting/diarrhea Musculoskeletal: no muscle/joint aches, in WC related to recent  stroke affecting left side Skin: no rashes Neurological: no tremors, no numbness, no tingling, no dizziness Psychiatric: no depression, no anxiety   Objective:   Objective     BP 125/73    Pulse [name] Kitchen)  58    Ht 5\' 2"  (1.575 m)    SpO2 94%    BMI 24.33 kg/m  Wt Readings from Last 3 Encounters:  08/02/21 133 lb (60.3 kg)  07/27/21 133 lb (60.3 kg)  05/27/21 133 lb (60.3 kg)    BP Readings from Last 3 Encounters:  11/30/21 125/73  11/29/21 110/60  10/29/21 128/70      Physical Exam- Limited  Constitutional:  Body mass index is 24.33 kg/m. , not in acute distress, normal state of mind  Musculoskeletal: no gross deformities, WC bound due to recent stroke    CMP ( most recent) CMP     Component Value Date/Time   NA 142 10/29/2021 1606   K 4.3 10/29/2021 1606   CL 105 10/29/2021 1606   CO2 30 10/29/2021 1606   GLUCOSE 97 10/29/2021 1606   BUN 26 (H) 10/29/2021 1606   CREATININE 0.84 10/29/2021 1606   CALCIUM 9.7 10/29/2021 1606   PROT 7.3 10/29/2021 1606   ALBUMIN 4.3 10/29/2021 1606   AST 15 10/29/2021 1606   ALT 14 10/29/2021 1606   ALKPHOS 44 10/29/2021 1606   BILITOT 0.4 10/29/2021 1606   GFRNONAA >60 08/04/2021 0537   GFRAA 73 02/12/2008 1525     Diabetic Labs (most recent): Lab Results  Component Value Date   HGBA1C 6.1 10/29/2021   HGBA1C 6.4 (H) 01/27/2021   HGBA1C 6.6 (H) 11/09/2020     Lipid Panel ( most recent) Lipid Panel     Component Value Date/Time   CHOL 192 10/29/2021 1606   TRIG 163.0 (H) 10/29/2021 1606   HDL 43.10 10/29/2021 1606   CHOLHDL 4 10/29/2021 1606   VLDL 32.6 10/29/2021 1606   LDLCALC 116 (H) 10/29/2021 1606   LDLDIRECT 137.5 10/19/2012 1106       Lab Results  Component Value Date   TSH 2.01 10/29/2021   TSH 1.402 08/03/2021   TSH 1.42 07/22/2021   TSH 20.460 (H) 05/05/2021   TSH 26.064 (H) 05/04/2021   TSH 26.362 (H) 01/27/2021   TSH 39.15 (H) 11/09/2020   TSH 34.78 (H) 03/11/2020   TSH 30.78 (H)  11/02/2018   TSH 0.08 (L) 05/11/2017   FREET4 0.66 01/28/2021   FREET4 0.38 (L) 11/09/2020      Assessment & Plan:   ASSESSMENT / PLAN:  1. Hypothyroidism-s/p RAI ablation for hyperthyroidism many years ago   Patient with long-standing hypothyroidism, on Levothyroxine therapy. On physical exam, patient  does not have gross goiter, thyroid nodules, or neck compression symptoms.     -Her previsit thyroid function tests are consistent with appropriate replacement.  She is advised to continue levothyroxine 88 mcg p.o. daily before breakfast.    - We discussed about the correct intake of her thyroid hormone, on empty stomach at fasting, with water, separated by at least 30 minutes from breakfast and other medications,  and separated by more than 4 hours from calcium, iron, multivitamins, acid reflux medications (PPIs). -Patient is made aware of the fact that thyroid hormone replacement is needed for life, dose to be adjusted by periodic monitoring of thyroid function tests.  - Will check thyroid tests before next visit: TSH, free T4.     I spent 21 minutes in the care of the patient today including review of labs from Thyroid Function, CMP, and other relevant labs ; imaging/biopsy records (current and previous including abstractions from other facilities); face-to-face time discussing  her lab results and symptoms, medications doses, her options of short  and long term treatment based on the latest standards of care / guidelines;   and documenting the encounter.  [name]  participated in the discussions, expressed understanding, and voiced agreement with the above plans.  All questions were answered to her satisfaction. she is encouraged to contact clinic should she have any questions or concerns prior to her return visit.    FOLLOW UP PLAN:  Return in about 6 months (around 05/30/2022) for F/U with Pre-visit Labs.  Rayetta Pigg, [facility] [address] [place], [place] [address] Phone: [phone] Fax: [phone]  11/30/2021, 4:04 PM

## 2022-01-06 ENCOUNTER — Ambulatory Visit (INDEPENDENT_AMBULATORY_CARE_PROVIDER_SITE_OTHER): Payer: Medicare HMO

## 2022-01-06 ENCOUNTER — Ambulatory Visit (INDEPENDENT_AMBULATORY_CARE_PROVIDER_SITE_OTHER): Payer: Medicare HMO | Admitting: Internal Medicine

## 2022-01-06 ENCOUNTER — Encounter: Payer: Self-pay | Admitting: Internal Medicine

## 2022-01-06 VITALS — BP 108/60 | HR 88 | Temp 98.1°F | Resp 16 | Ht 62.0 in

## 2022-01-06 DIAGNOSIS — E538 Deficiency of other specified B group vitamins: Secondary | ICD-10-CM

## 2022-01-06 DIAGNOSIS — R7302 Impaired glucose tolerance (oral): Secondary | ICD-10-CM | POA: Diagnosis not present

## 2022-01-06 DIAGNOSIS — F319 Bipolar disorder, unspecified: Secondary | ICD-10-CM | POA: Diagnosis not present

## 2022-01-06 DIAGNOSIS — R918 Other nonspecific abnormal finding of lung field: Secondary | ICD-10-CM | POA: Diagnosis not present

## 2022-01-06 DIAGNOSIS — N1831 Chronic kidney disease, stage 3a: Secondary | ICD-10-CM | POA: Diagnosis not present

## 2022-01-06 DIAGNOSIS — E559 Vitamin D deficiency, unspecified: Secondary | ICD-10-CM

## 2022-01-06 DIAGNOSIS — R410 Disorientation, unspecified: Secondary | ICD-10-CM | POA: Diagnosis not present

## 2022-01-06 DIAGNOSIS — Z8673 Personal history of transient ischemic attack (TIA), and cerebral infarction without residual deficits: Secondary | ICD-10-CM | POA: Diagnosis not present

## 2022-01-06 DIAGNOSIS — I10 Essential (primary) hypertension: Secondary | ICD-10-CM | POA: Diagnosis not present

## 2022-01-06 LAB — CBC WITH DIFFERENTIAL/PLATELET
Basophils Absolute: 0 10*3/uL (ref 0.0–0.1)
Basophils Relative: 0.7 % (ref 0.0–3.0)
Eosinophils Absolute: 0.2 10*3/uL (ref 0.0–0.7)
Eosinophils Relative: 2.8 % (ref 0.0–5.0)
HCT: 50.2 % — ABNORMAL HIGH (ref 36.0–46.0)
Hemoglobin: 16.2 g/dL — ABNORMAL HIGH (ref 12.0–15.0)
Lymphocytes Relative: 25.8 % (ref 12.0–46.0)
Lymphs Abs: 1.8 10*3/uL (ref 0.7–4.0)
MCHC: 32.3 g/dL (ref 30.0–36.0)
MCV: 101.1 fl — ABNORMAL HIGH (ref 78.0–100.0)
Monocytes Absolute: 0.7 10*3/uL (ref 0.1–1.0)
Monocytes Relative: 9.4 % (ref 3.0–12.0)
Neutro Abs: 4.3 10*3/uL (ref 1.4–7.7)
Neutrophils Relative %: 61.3 % (ref 43.0–77.0)
Platelets: 228 10*3/uL (ref 150.0–400.0)
RBC: 4.97 Mil/uL (ref 3.87–5.11)
RDW: 14.7 % (ref 11.5–15.5)
WBC: 7 10*3/uL (ref 4.0–10.5)

## 2022-01-06 LAB — LIPID PANEL
Cholesterol: 214 mg/dL — ABNORMAL HIGH (ref 0–200)
HDL: 54.6 mg/dL (ref >39.00)
LDL Cholesterol: 120 mg/dL — ABNORMAL HIGH (ref 0–99)
NonHDL: 159.18
Total CHOL/HDL Ratio: 4
Triglycerides: 194 mg/dL — ABNORMAL HIGH (ref 0.0–149.0)
VLDL: 38.8 mg/dL (ref 0.0–40.0)

## 2022-01-06 LAB — BASIC METABOLIC PANEL
BUN: 26 mg/dL — ABNORMAL HIGH (ref 6–23)
CO2: 27 mEq/L (ref 19–32)
Calcium: 10.3 mg/dL (ref 8.4–10.5)
Chloride: 98 mEq/L (ref 96–112)
Creatinine, Ser: 0.91 mg/dL (ref 0.40–1.20)
GFR: 62.92 mL/min (ref >60.00)
Glucose, Bld: 126 mg/dL — ABNORMAL HIGH (ref 70–99)
Potassium: 3.8 mEq/L (ref 3.5–5.1)
Sodium: 138 mEq/L (ref 135–145)

## 2022-01-06 LAB — HEPATIC FUNCTION PANEL
ALT: 18 U/L (ref 0–35)
AST: 23 U/L (ref 0–37)
Albumin: 4.5 g/dL (ref 3.5–5.2)
Alkaline Phosphatase: 56 U/L (ref 39–117)
Bilirubin, Direct: 0.1 mg/dL (ref 0.0–0.3)
Total Bilirubin: 0.4 mg/dL (ref 0.2–1.2)
Total Protein: 7.6 g/dL (ref 6.0–8.3)

## 2022-01-06 LAB — VITAMIN B12: Vitamin B-12: >1504 pg/mL — ABNORMAL HIGH (ref 211–911)

## 2022-01-06 LAB — TSH: TSH: 1.38 u[IU]/mL (ref 0.35–5.50)

## 2022-01-06 LAB — VITAMIN D 25 HYDROXY (VIT D DEFICIENCY, FRACTURES): VITD: 46.54 ng/mL (ref 30.00–100.00)

## 2022-01-06 LAB — HEMOGLOBIN A1C: Hgb A1c MFr Bld: 6.1 % (ref 4.6–6.5)

## 2022-01-06 NOTE — Patient Instructions (Signed)
Ok to stop the metoprolol due to the lower BP today ? ?Please continue all other medications as before, and refills have been done if requested. ? ?Please have the pharmacy call with any other refills you may need. ? ?Please continue your efforts at being more active, low cholesterol diet, and weight control. ? ?You are otherwise up to date with prevention measures today. ? ?Please keep your appointments with your specialists as you may have planned ? ?Please go to the XRAY Department in the first floor for the x-ray testing ? ?Please go to the LAB at the blood drawing area for the tests to be done ? ?You will be contacted by phone if any changes need to be made immediately.  Otherwise, you will receive a letter about your results with an explanation, but please check with MyChart first. ? ?Please remember to sign up for MyChart if you have not done so, as this will be important to you in the future with finding out test results, communicating by private email, and scheduling acute appointments online when needed. ? ?Please make an Appointment to return in 6 months, or sooner if needed ?

## 2022-01-06 NOTE — Assessment & Plan Note (Signed)
Last vitamin D ?Lab Results  ?Component Value Date  ? VD25OH 51.79 10/29/2021  ? ?Stable, cont oral replacement ? ?

## 2022-01-06 NOTE — Assessment & Plan Note (Signed)
Ok to continue asa/plavix, stable overall ?

## 2022-01-06 NOTE — Assessment & Plan Note (Addendum)
Has seen Dr Reece Levy in past, to continue depakote - check level, encouraged f/u with psychiatry ?

## 2022-01-06 NOTE — Progress Notes (Signed)
Patient ID: Susan Davidson, female   DOB: [birth year], 71 y.o.   MRN: [id number] ? ? ?    Chief Complaint: follow up confusion, lower BP, psychiatric symptoms ? ?     HPI:  Susan Davidson is a 71 y.o. female here with son who relates recent symptoms of unclear clinical significance - he relates what her refers to as a "spell" last weekend with worsening generalized weakness, confusion, delusional, possible hallucinations, talks in sleep, some worsening swallowing difficulty in taking po meds but still able to get them down, slept all day tues apr 4 with little po intake but much more alert the next day taking po fluids and seemed quite hungry. Has had some less appetite yesterday again and seemed unsteady with using utensils to eat more than usual.  Pt denies chest pain, increased sob or doe, wheezing, orthopnea, PND, increased LE swelling, palpitations, or syncope.  Denies urinary symptoms such as dysuria, frequency, urgency, flank pain, hematuria or n/v, fever, chills.  Son asking for urine studies to r/o UTI since he read online this could a common issue.   Pt denies polydipsia, polyuria, or new focal neuro s/s.  For some reason has not been taking the metoprolol for 3 wks ?      ?Wt Readings from Last 3 Encounters:  ?08/02/21 133 lb (60.3 kg)  ?07/27/21 133 lb (60.3 kg)  ?05/27/21 133 lb (60.3 kg)  ? ?BP Readings from Last 3 Encounters:  ?01/06/22 108/60  ?11/30/21 125/73  ?11/29/21 110/60  ? ?      ?Past Medical History:  ?Diagnosis Date  ? ANXIETY 02/12/2008  ? Qualifier: Diagnosis of  By: Jenny Reichmann MD, Hunt Oris   ? DEPRESSION 02/12/2008  ? Qualifier: Diagnosis of  By: Elveria Royals   ? HYPERLIPIDEMIA 02/12/2008  ? Qualifier: Diagnosis of  By: Elveria Royals   ? HYPERTENSION 02/12/2008  ? Qualifier: Diagnosis of  By: Elveria Royals   ? HYPOTHYROIDISM 02/12/2008  ? Qualifier: Diagnosis of  By: Elveria Royals   ? Impaired glucose tolerance 08/27/2011  ? OSTEOPENIA 02/12/2008  ? Qualifier:  Diagnosis of  By: Jenny Reichmann MD, Hunt Oris   ? VITAMIN D DEFICIENCY 04/14/2010  ? Qualifier: Diagnosis of  By: Jenny Reichmann MD, Hunt Oris   ? ?Past Surgical History:  ?Procedure Laterality Date  ? IR RADIOLOGIST EVAL & MGMT  08/18/2021  ? ? reports that she quit smoking about a year ago. Her smoking use included cigarettes. She has never used smokeless tobacco. She reports current alcohol use. She reports that she does not use drugs. ?family history includes Bipolar disorder in her sister; Heart disease in her father. ?Allergies  ?Allergen Reactions  ? Other Anaphylaxis  ?  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 Epinephrine   ? Fire [employer] (Solenopsis [place]) Allergy Skin Test   ? Influenza Vac Split Quad   ?  fatigue  ? ?Current Outpatient Medications on File Prior to Visit  ?Medication Sig Dispense Refill  ? acetaminophen (TYLENOL) 325 MG tablet Take 2 tablets (650 mg total) by mouth every 6 (six) hours as needed for mild pain or headache (fever >/= 101). 12 tablet 2  ? ALPRAZolam (XANAX) 0.5 MG tablet Take 1 tablet (0.5 mg total) by mouth in the morning, at noon, and at bedtime. 12 tablet 0  ? ascorbic acid (VITAMIN C) 500 MG tablet Take 1 tablet (500 mg total) by mouth daily. 30 tablet 2  ? aspirin EC 81 MG tablet Take  1 tablet (81 mg total) by mouth daily with breakfast. Swallow whole. 30 tablet 11  ? clopidogrel (PLAVIX) 75 MG tablet Take 1 tablet (75 mg total) by mouth daily. 90 tablet 3  ? divalproex (DEPAKOTE) 250 MG DR tablet Take 1 tablet by mouth in the morning, at noon, and at bedtime.    ? levothyroxine (SYNTHROID) 88 MCG tablet Take 1 tablet (88 mcg total) by mouth daily. 90 tablet 1  ? melatonin 5 MG TABS Take 5 mg by mouth.    ? memantine (NAMENDA) 5 MG tablet Take 1 tablet (5 mg total) by mouth 2 (two) times daily. 180 tablet 3  ? Multiple Vitamin (MULTIVITAMIN) tablet Take 1 tablet by mouth daily.    ? PARoxetine (PAXIL) 20 MG tablet Take 20 mg by mouth daily.    ? polyethylene glycol (MIRALAX / GLYCOLAX) 17 g packet Take 17 g by mouth  daily as needed for mild constipation. 14 each 0  ? rosuvastatin (CRESTOR) 5 MG tablet Take 1 tablet (5 mg total) by mouth daily. Hold Crestor while taking Paxlovid 30 tablet 4  ? vitamin B-12 (CYANOCOBALAMIN) 1000 MCG tablet Take 1 tablet (1,000 mcg total) by mouth daily. 90 tablet 3  ? zinc sulfate 220 (50 Zn) MG capsule Take 1 capsule (220 mg total) by mouth daily. 30 capsule 0  ? ?No current facility-administered medications on file prior to visit.  ? ?     ROS:  All others reviewed and negative. ? ?Objective  ? ?     PE:  BP 108/60 (BP Location: Right Arm, Patient Position: Sitting, Cuff Size: Normal)   Pulse 88   Temp 98.1 ?F (36.7 ?C) (Oral)   Resp 16   Ht 5\' 2"  (1.575 m)   BMI 24.33 kg/m?  ? ?              Constitutional: Pt appears in NAD ?              HENT: Head: NCAT.  ?              Right Ear: External ear normal.   ?              Left Ear: External ear normal.  ?              Eyes: . Pupils are equal, round, and reactive to light. Conjunctivae and EOM are normal ?              Nose: without d/c or deformity ?              Neck: Neck supple. Gross normal ROM ?              Cardiovascular: Normal rate and regular rhythm.   ?              Pulmonary/Chest: Effort normal and breath sounds without rales or wheezing.  ?              Abd:  Soft, NT, ND, + BS, no organomegaly ?              Neurological: Pt is alert. At baseline orientation, motor grossly intact ?              Skin: Skin is warm. No rashes, no other new lesions, LE edema - none ?              Psychiatric: Pt behavior is normal without agitation , but mild  confused and difficult to know baseline ? ?Micro: none ? ?Cardiac tracings I have personally interpreted today:  none ? ?Pertinent Radiological findings (summarize): none  ? ?Lab Results  ?Component Value Date  ? WBC 7.0 01/06/2022  ? HGB 16.2 (H) 01/06/2022  ? HCT 50.2 (H) 01/06/2022  ? PLT 228.0 01/06/2022  ? GLUCOSE 126 (H) 01/06/2022  ? CHOL 214 (H) 01/06/2022  ? TRIG 194.0 (H)  01/06/2022  ? HDL 54.60 01/06/2022  ? LDLDIRECT 137.5 10/19/2012  ? LDLCALC 120 (H) 01/06/2022  ? ALT 18 01/06/2022  ? AST 23 01/06/2022  ? NA 138 01/06/2022  ? K 3.8 01/06/2022  ? CL 98 01/06/2022  ? CREATININE 0.91 01/06/2022  ? BUN 26 (H) 01/06/2022  ? CO2 27 01/06/2022  ? TSH 1.38 01/06/2022  ? INR 0.9 02/01/2021  ? HGBA1C 6.1 01/06/2022  ? ?Assessment/Plan:  ?ILONA COLLEY is a 71 y.o. White or Caucasian [1] female with  has a past medical history of ANXIETY (02/12/2008), DEPRESSION (02/12/2008), HYPERLIPIDEMIA (02/12/2008), HYPERTENSION (02/12/2008), HYPOTHYROIDISM (02/12/2008), Impaired glucose tolerance (08/27/2011), OSTEOPENIA (02/12/2008), and VITAMIN D DEFICIENCY (04/14/2010). ? ?History of stroke ?Ok to continue asa/plavix, stable overall ? ?Vitamin D deficiency ?Last vitamin D ?Lab Results  ?Component Value Date  ? VD25OH 51.79 10/29/2021  ? ?Stable, cont oral replacement ? ? ?Bipolar illness ([place]) ?Has seen Dr Reece Levy in past, to continue depakote - check level, encouraged f/u with psychiatry ? ?Confusion ?? Mild worsening per son, difficult to assess baseline, for UA with labs and cxr, and consider neuropsych referral ? ?B12 deficiency ?Lab Results  ?Component Value Date  ? [id number] >1504 (H) 01/06/2022  ? ?Stable, cont oral replacement - b12 1000 mcg qd ? ? ?CKD (chronic kidney disease) ?Lab Results  ?Component Value Date  ? CREATININE 0.91 01/06/2022  ? ?Stable overall, cont to avoid nephrotoxins ? ? ?Essential hypertension ?BP Readings from Last 3 Encounters:  ?01/06/22 108/60  ?11/30/21 125/73  ?11/29/21 110/60  ? ?Low normal today, ok to continue off the BB for now, cont monitor BP at home and next visit ? ? ?Impaired glucose tolerance ?Lab Results  ?Component Value Date  ? HGBA1C 6.1 01/06/2022  ? ?Stable, pt to continue current medical treatment  - diet ? ?Followup: Return in about 6 months (around 07/08/2022). ? ?Cathlean Cower, MD 01/08/2022 2:51 PM ?[place] ?[place] ?Internal Medicine ?

## 2022-01-08 ENCOUNTER — Encounter: Payer: Self-pay | Admitting: Internal Medicine

## 2022-01-08 DIAGNOSIS — R410 Disorientation, unspecified: Secondary | ICD-10-CM | POA: Insufficient documentation

## 2022-01-08 NOTE — Assessment & Plan Note (Signed)
BP Readings from Last 3 Encounters:  ?01/06/22 108/60  ?11/30/21 125/73  ?11/29/21 110/60  ? ?Low normal today, ok to continue off the BB for now, cont monitor BP at home and next visit ? ?

## 2022-01-08 NOTE — Assessment & Plan Note (Signed)
Lab Results  ?Component Value Date  ? CREATININE 0.91 01/06/2022  ? ?Stable overall, cont to avoid nephrotoxins ? ?

## 2022-01-08 NOTE — Assessment & Plan Note (Addendum)
?   Mild worsening per son, difficult to assess baseline, for UA with labs and cxr, and consider neuropsych referral ?

## 2022-01-08 NOTE — Assessment & Plan Note (Signed)
Lab Results  ?Component Value Date  ? [id number] >1504 (H) 01/06/2022  ? ?Stable, cont oral replacement - b12 1000 mcg qd ? ?

## 2022-01-08 NOTE — Assessment & Plan Note (Signed)
Lab Results  ?Component Value Date  ? HGBA1C 6.1 01/06/2022  ? ?Stable, pt to continue current medical treatment  - diet ? ?

## 2022-01-10 DIAGNOSIS — R410 Disorientation, unspecified: Secondary | ICD-10-CM | POA: Diagnosis not present

## 2022-01-10 LAB — URINALYSIS, ROUTINE W REFLEX MICROSCOPIC
Bilirubin Urine: NEGATIVE
Ketones, ur: NEGATIVE
Nitrite: NEGATIVE
Specific Gravity, Urine: 1.015 (ref 1.000–1.030)
Total Protein, Urine: 30 — AB
Urine Glucose: NEGATIVE
Urobilinogen, UA: 0.2 (ref 0.0–1.0)
pH: 6 (ref 5.0–8.0)

## 2022-01-10 NOTE — Addendum Note (Signed)
Addended by: Boris Lown B on: 01/10/2022 12:56 PM ? ? Modules accepted: Orders ? ?

## 2022-01-11 LAB — URINE CULTURE

## 2022-01-13 LAB — URINE CULTURE

## 2022-01-13 LAB — VALPROIC ACID LEVEL: Valproic Acid Lvl: 62.9 mg/L (ref 50.0–100.0)

## 2022-01-18 ENCOUNTER — Emergency Department (HOSPITAL_COMMUNITY): Payer: Medicare HMO

## 2022-01-18 ENCOUNTER — Other Ambulatory Visit: Payer: Self-pay

## 2022-01-18 ENCOUNTER — Encounter (HOSPITAL_COMMUNITY): Payer: Self-pay

## 2022-01-18 ENCOUNTER — Observation Stay (HOSPITAL_COMMUNITY)
Admission: EM | Admit: 2022-01-18 | Discharge: 2022-01-25 | Disposition: A | Discharge status: Skilled Nursing Facility | Payer: Medicare HMO | Attending: Internal Medicine | Admitting: Internal Medicine

## 2022-01-18 DIAGNOSIS — E039 Hypothyroidism, unspecified: Secondary | ICD-10-CM | POA: Diagnosis not present

## 2022-01-18 DIAGNOSIS — I959 Hypotension, unspecified: Secondary | ICD-10-CM | POA: Diagnosis not present

## 2022-01-18 DIAGNOSIS — R079 Chest pain, unspecified: Secondary | ICD-10-CM | POA: Diagnosis not present

## 2022-01-18 DIAGNOSIS — Z79899 Other long term (current) drug therapy: Secondary | ICD-10-CM | POA: Insufficient documentation

## 2022-01-18 DIAGNOSIS — Z8673 Personal history of transient ischemic attack (TIA), and cerebral infarction without residual deficits: Secondary | ICD-10-CM | POA: Diagnosis not present

## 2022-01-18 DIAGNOSIS — R4182 Altered mental status, unspecified: Secondary | ICD-10-CM

## 2022-01-18 DIAGNOSIS — G9341 Metabolic encephalopathy: Secondary | ICD-10-CM | POA: Diagnosis not present

## 2022-01-18 DIAGNOSIS — Z87891 Personal history of nicotine dependence: Secondary | ICD-10-CM | POA: Diagnosis not present

## 2022-01-18 DIAGNOSIS — N183 Chronic kidney disease, stage 3 unspecified: Secondary | ICD-10-CM | POA: Insufficient documentation

## 2022-01-18 DIAGNOSIS — I639 Cerebral infarction, unspecified: Secondary | ICD-10-CM | POA: Diagnosis not present

## 2022-01-18 DIAGNOSIS — Z20822 Contact with and (suspected) exposure to covid-19: Secondary | ICD-10-CM | POA: Diagnosis not present

## 2022-01-18 DIAGNOSIS — R400 Somnolence: Secondary | ICD-10-CM | POA: Insufficient documentation

## 2022-01-18 DIAGNOSIS — F32A Depression, unspecified: Secondary | ICD-10-CM | POA: Diagnosis not present

## 2022-01-18 DIAGNOSIS — F0390 Unspecified dementia without behavioral disturbance: Secondary | ICD-10-CM | POA: Insufficient documentation

## 2022-01-18 DIAGNOSIS — Z7902 Long term (current) use of antithrombotics/antiplatelets: Secondary | ICD-10-CM | POA: Insufficient documentation

## 2022-01-18 DIAGNOSIS — R531 Weakness: Secondary | ICD-10-CM | POA: Diagnosis not present

## 2022-01-18 DIAGNOSIS — R0789 Other chest pain: Secondary | ICD-10-CM | POA: Diagnosis not present

## 2022-01-18 DIAGNOSIS — R410 Disorientation, unspecified: Secondary | ICD-10-CM

## 2022-01-18 DIAGNOSIS — I10 Essential (primary) hypertension: Secondary | ICD-10-CM | POA: Diagnosis not present

## 2022-01-18 DIAGNOSIS — R2689 Other abnormalities of gait and mobility: Secondary | ICD-10-CM | POA: Diagnosis not present

## 2022-01-18 DIAGNOSIS — Z7982 Long term (current) use of aspirin: Secondary | ICD-10-CM | POA: Diagnosis not present

## 2022-01-18 DIAGNOSIS — I129 Hypertensive chronic kidney disease with stage 1 through stage 4 chronic kidney disease, or unspecified chronic kidney disease: Secondary | ICD-10-CM | POA: Insufficient documentation

## 2022-01-18 HISTORY — DX: Hemiplegia, unspecified affecting left nondominant side: G81.94

## 2022-01-18 HISTORY — DX: Cerebral infarction, unspecified: I63.9

## 2022-01-18 LAB — BLOOD GAS, VENOUS
Acid-Base Excess: 3.4 mmol/L — ABNORMAL HIGH (ref 0.0–2.0)
Bicarbonate: 30.4 mmol/L — ABNORMAL HIGH (ref 20.0–28.0)
FIO2: 21 %
O2 Saturation: 36.2 %
Patient temperature: 37.9
pCO2, Ven: 57 mmHg (ref 44–60)
pH, Ven: 7.34 (ref 7.25–7.43)
pO2, Ven: <31 mmHg — CL (ref 32–45)

## 2022-01-18 LAB — URINALYSIS, ROUTINE W REFLEX MICROSCOPIC
Bilirubin Urine: NEGATIVE
Glucose, UA: NEGATIVE mg/dL
Hgb urine dipstick: NEGATIVE
Ketones, ur: NEGATIVE mg/dL
Leukocytes,Ua: NEGATIVE
Nitrite: NEGATIVE
Protein, ur: NEGATIVE mg/dL
Specific Gravity, Urine: 1.015 (ref 1.005–1.030)
pH: 5 (ref 5.0–8.0)

## 2022-01-18 LAB — CBC
HCT: 44.3 % (ref 36.0–46.0)
Hemoglobin: 14.3 g/dL (ref 12.0–15.0)
MCH: 32.6 pg (ref 26.0–34.0)
MCHC: 32.3 g/dL (ref 30.0–36.0)
MCV: 101.1 fL — ABNORMAL HIGH (ref 80.0–100.0)
Platelets: 200 10*3/uL (ref 150–400)
RBC: 4.38 MIL/uL (ref 3.87–5.11)
RDW: 14.8 % (ref 11.5–15.5)
WBC: 8.6 10*3/uL (ref 4.0–10.5)
nRBC: 0 % (ref 0.0–0.2)

## 2022-01-18 LAB — VALPROIC ACID LEVEL: Valproic Acid Lvl: 29 ug/mL — ABNORMAL LOW (ref 50.0–100.0)

## 2022-01-18 LAB — BASIC METABOLIC PANEL
Anion gap: 9 (ref 5–15)
BUN: 28 mg/dL — ABNORMAL HIGH (ref 8–23)
CO2: 26 mmol/L (ref 22–32)
Calcium: 8.8 mg/dL — ABNORMAL LOW (ref 8.9–10.3)
Chloride: 102 mmol/L (ref 98–111)
Creatinine, Ser: 0.98 mg/dL (ref 0.44–1.00)
GFR, Estimated: >60 mL/min (ref >60)
Glucose, Bld: 110 mg/dL — ABNORMAL HIGH (ref 70–99)
Potassium: 4.4 mmol/L (ref 3.5–5.1)
Sodium: 137 mmol/L (ref 135–145)

## 2022-01-18 LAB — RESP PANEL BY RT-PCR (FLU A&B, COVID) ARPGX2
Influenza A by PCR: NEGATIVE
Influenza B by PCR: NEGATIVE
SARS Coronavirus 2 by RT PCR: NEGATIVE

## 2022-01-18 LAB — LACTIC ACID, PLASMA
Lactic Acid, Venous: 1.8 mmol/L (ref 0.5–1.9)
Lactic Acid, Venous: 2 mmol/L (ref 0.5–1.9)
Lactic Acid, Venous: 1 mmol/L (ref 0.5–1.9)

## 2022-01-18 LAB — RAPID URINE DRUG SCREEN, HOSP PERFORMED
Amphetamines: POSITIVE — AB
Barbiturates: NOT DETECTED
Benzodiazepines: POSITIVE — AB
Cocaine: NOT DETECTED
Opiates: NOT DETECTED
Tetrahydrocannabinol: NOT DETECTED

## 2022-01-18 LAB — AMMONIA: Ammonia: 13 umol/L (ref 9–35)

## 2022-01-18 LAB — CBG MONITORING, ED
Glucose-Capillary: 99 mg/dL (ref 70–99)
Glucose-Capillary: 139 mg/dL — ABNORMAL HIGH (ref 70–99)

## 2022-01-18 LAB — TROPONIN I (HIGH SENSITIVITY)
Troponin I (High Sensitivity): 5 ng/L (ref <18)
Troponin I (High Sensitivity): 5 ng/L (ref <18)

## 2022-01-18 LAB — ETHANOL: Alcohol, Ethyl (B): <10 mg/dL (ref <10)

## 2022-01-18 LAB — TSH: TSH: 1.348 u[IU]/mL (ref 0.350–4.500)

## 2022-01-18 MED ORDER — POLYETHYLENE GLYCOL 3350 17 G PO PACK
17.0000 g | PACK | Freq: Every day | ORAL | Status: DC | PRN
  Administered 2022-01-22 – 2022-01-25 (×2): 17 g via ORAL
  Filled 2022-01-18 (×2): qty 1

## 2022-01-18 MED ORDER — ACETAMINOPHEN 650 MG RE SUPP: 650.0000 mg | Freq: Four times a day (QID) | RECTAL | Status: DC | PRN

## 2022-01-18 MED ORDER — ENOXAPARIN SODIUM 40 MG/0.4ML IJ SOSY
40.0000 mg | PREFILLED_SYRINGE | Freq: Every day | INTRAMUSCULAR | Status: DC
  Administered 2022-01-18 – 2022-01-24 (×7): 40 mg via SUBCUTANEOUS
  Filled 2022-01-18 (×7): qty 0.4

## 2022-01-18 MED ORDER — SODIUM CHLORIDE 0.9 % IV BOLUS
1000.0000 mL | Freq: Once | INTRAVENOUS | Status: AC
  Administered 2022-01-18: 1000 mL via INTRAVENOUS

## 2022-01-18 MED ORDER — ACETAMINOPHEN 325 MG PO TABS
650.0000 mg | ORAL_TABLET | Freq: Four times a day (QID) | ORAL | Status: DC | PRN
  Administered 2022-01-19 – 2022-01-20 (×2): 650 mg via ORAL
  Filled 2022-01-18 (×2): qty 2

## 2022-01-18 MED ORDER — DEXTROSE-NACL 5-0.9 % IV SOLN: INTRAVENOUS | Status: AC

## 2022-01-18 MED ORDER — ONDANSETRON HCL 4 MG PO TABS: 4.0000 mg | ORAL_TABLET | Freq: Four times a day (QID) | ORAL | Status: DC | PRN

## 2022-01-18 MED ORDER — ONDANSETRON HCL 4 MG/2ML IJ SOLN
4.0000 mg | Freq: Four times a day (QID) | INTRAMUSCULAR | Status: DC | PRN
  Administered 2022-01-20 – 2022-01-22 (×2): 4 mg via INTRAVENOUS
  Filled 2022-01-18 (×2): qty 2

## 2022-01-18 NOTE — ED Notes (Signed)
Date and time results received: 01/18/22 1616 ?(use smartphrase ".now" to insert current time) ? ?Test: lactic acid ?Critical Value: 2.0 ? ?Name of Provider Notified: Dr Roderic Palau ? ?Orders Received? Or Actions Taken?: no orders at this time ?

## 2022-01-18 NOTE — H&P (Signed)
?History and Physical  ? ? ?Susan Davidson QZR:[id number] DOB: [birth year] DOA: 01/18/2022 ? ?PCP: Biagio Borg, MD  ? ?Patient coming from: Home ? ?I have personally briefly reviewed patient's old medical records in [place] ? ?Chief Complaint: AMS ? ?HPI: Susan Davidson is a 71 y.o. female with medical history significant for dementia, CKD 3, CVA with residual left-sided hemiparesis, hypertension and depression. ?Patient was brought to the ED with reports of altered mental status.  At the time of my evaluation, patient is awake, answers a few simple questions, intermittently somnolent.  Spouse is at bedside and assists with the history.  ?Over the past 2 days patient has been sleeping a lot, barely eaten over the past 2 days.  Patient complained about abdominal pain at some point, and today she complained of chest pain to the aide who came to help patient at home.  Patient denies both of this at this time.  Spouse reports chills and sweats since last night.  Family helps patient with her medications so no chance that patient took more medications than she should have.  No vomiting no loose stools.  Mild cough, no difficulty breathing.  Patient very rarely ambulates with wheelchair, otherwise she is nonambulatory.  ? ?Spouse reports patient has had these episodes in the past, these usually resolve spontaneously if she does not have a UTI, but usually she will  still eat and this episode is more severe than prior. ? ?ED Course: Max 100.2.  Blood pressure systolic 35-456.  O2 sats greater than 92% on room air.  MRI brain -Limited as patient could not tolerate all sequences, but no acute infarct seen.,  Shows chronic infarcts.  Chest x-ray clear.  UA not suggestive of UTI.  Ammonia level 13.  COVID and influenza negative.  WBC 8.6.  VBG shows pH of 7.34, PCO2 of 57.  Lactic acid 1.8 > 2.  1 L bolus given.  Hospitalist to admit for altered mental status. ? ?Review of Systems: As per HPI all other systems  reviewed and negative. ? ?Past Medical History:  ?Diagnosis Date  ? ANXIETY 02/12/2008  ? Qualifier: Diagnosis of  By: Jenny Reichmann MD, Hunt Oris   ? CVA (cerebral vascular accident) [facility])   ? DEPRESSION 02/12/2008  ? Qualifier: Diagnosis of  By: Elveria Royals   ? HYPERLIPIDEMIA 02/12/2008  ? Qualifier: Diagnosis of  By: Elveria Royals   ? HYPERTENSION 02/12/2008  ? Qualifier: Diagnosis of  By: Elveria Royals   ? HYPOTHYROIDISM 02/12/2008  ? Qualifier: Diagnosis of  By: Elveria Royals   ? Impaired glucose tolerance 08/27/2011  ? Left hemiparesis ([place])   ? OSTEOPENIA 02/12/2008  ? Qualifier: Diagnosis of  By: Jenny Reichmann MD, Hunt Oris   ? VITAMIN D DEFICIENCY 04/14/2010  ? Qualifier: Diagnosis of  By: Jenny Reichmann MD, Hunt Oris   ? ? ?Past Surgical History:  ?Procedure Laterality Date  ? IR RADIOLOGIST EVAL & MGMT  08/18/2021  ? ? ? reports that she quit smoking about a year ago. Her smoking use included cigarettes. She has never used smokeless tobacco. She reports current alcohol use. She reports that she does not use drugs. ? ?Allergies  ?Allergen Reactions  ? Other Anaphylaxis  ?  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 Epinephrine   ? Fire [employer] (Solenopsis [place]) Allergy Skin Test   ? Influenza Vac Split Quad   ?  fatigue  ? ? ?Family History  ?Problem Relation Age of Onset  ?  Heart disease Father   ? Bipolar disorder Sister   ? Diabetes Neg Hx   ? ? ?Prior to Admission medications   ?Medication Sig Start Date End Date Taking? Authorizing Provider  ?acetaminophen (TYLENOL) 325 MG tablet Take 2 tablets (650 mg total) by mouth every 6 (six) hours as needed for mild pain or headache (fever >/= 101). 08/04/21  Yes Roxan Hockey, MD  ?ALPRAZolam Duanne Moron) 0.5 MG tablet Take 1 tablet (0.5 mg total) by mouth in the morning, at noon, and at bedtime. ?Patient taking differently: Take 0.5 mg by mouth 4 (four) times daily. 08/04/21  Yes Trenita Hulme, Courage, MD  ?amphetamine-dextroamphetamine (ADDERALL) 10 MG tablet Take 10 mg by mouth 2  (two) times daily. 11/02/21  Yes [provider]  ?ascorbic acid (VITAMIN C) 500 MG tablet Take 1 tablet (500 mg total) by mouth daily. 08/05/21  Yes Roxan Hockey, MD  ?aspirin EC 81 MG tablet Take 1 tablet (81 mg total) by mouth daily with breakfast. Swallow whole. 08/04/21  Yes Roxan Hockey, MD  ?clopidogrel (PLAVIX) 75 MG tablet Take 1 tablet (75 mg total) by mouth daily. 11/12/21  Yes Biagio Borg, MD  ?divalproex (DEPAKOTE) 250 MG DR tablet Take 1 tablet by mouth See admin instructions. 1 tablet in the morning and 2 tablets every evening 07/28/21  Yes [provider]  ?levothyroxine (SYNTHROID) 88 MCG tablet Take 1 tablet (88 mcg total) by mouth daily. 11/30/21  Yes Nida, Marella Chimes, MD  ?melatonin 5 MG TABS Take 5 mg by mouth.   Yes [provider]  ?memantine (NAMENDA) 5 MG tablet Take 1 tablet (5 mg total) by mouth 2 (two) times daily. 11/12/21  Yes Biagio Borg, MD  ?Multiple Vitamin (MULTIVITAMIN) tablet Take 1 tablet by mouth daily.   Yes [provider]  ?OVER THE COUNTER MEDICATION Take 1 tablet by mouth daily. magnesium   Yes [provider]  ?PARoxetine (PAXIL) 40 MG tablet Take 40 mg by mouth every morning. 01/01/22  Yes [provider]  ?polyethylene glycol (MIRALAX / GLYCOLAX) 17 g packet Take 17 g by mouth daily as needed for mild constipation. 08/04/21  Yes Roxan Hockey, MD  ?rosuvastatin (CRESTOR) 5 MG tablet Take 1 tablet (5 mg total) by mouth daily. Hold Crestor while taking Paxlovid 11/01/21  Yes Biagio Borg, MD  ?vitamin B-12 (CYANOCOBALAMIN) 1000 MCG tablet Take 1 tablet (1,000 mcg total) by mouth daily. ?Patient not taking: Reported on 01/18/2022 03/14/20   Biagio Borg, MD  ?zinc sulfate 220 (50 Zn) MG capsule Take 1 capsule (220 mg total) by mouth daily. ?Patient not taking: Reported on 01/18/2022 08/05/21   Roxan Hockey, MD  ? ? ?Physical Exam: Exam limited by patient's altered mental status ?Vitals:  ? 01/18/22 1745  01/18/22 1830 01/18/22 1915 01/18/22 2000  ?BP: 139/61 117/64 119/64 (!) 129/57  ?Pulse: 80 82 79 80  ?Resp: 15 17 18 18   ?Temp:      ?TempSrc:      ?SpO2: 98% 92% 98% 97%  ?Weight:      ?Height:      ? ? ?Constitutional: NAD, calm, comfortable ?Vitals:  ? 01/18/22 1745 01/18/22 1830 01/18/22 1915 01/18/22 2000  ?BP: 139/61 117/64 119/64 (!) 129/57  ?Pulse: 80 82 79 80  ?Resp: 15 17 18 18   ?Temp:      ?TempSrc:      ?SpO2: 98% 92% 98% 97%  ?Weight:      ?Height:      ? ?  Eyes: PERRL, lids and conjunctivae normal ?ENMT: Mucous membranes are mildly dry ?Neck: normal, supple, no masses, no thyromegaly ?Respiratory: clear to auscultation bilaterally, no wheezing, no crackles. Normal respiratory effort. No accessory muscle use.  ?Cardiovascular: Regular rate and rhythm, no murmurs / rubs / gallops. No extremity edema.  Lower extremities warm. ?Abdomen: no tenderness elicited even with deep palpation, no masses palpated. No hepatosplenomegaly. Bowel sounds positive.  ?Musculoskeletal: no clubbing / cyanosis. No joint deformity upper and lower extremities. Good ROM, no contractures. Normal muscle tone.  ?Skin: no rashes, lesions, ulcers. No induration ?Neurologic: Limited exam due to altered mental status.  Barely following any directions, but was able to squeeze with her right hand. ?Psychiatric: Intermittently somnolent, oriented to person place unable to tell me she is in at [facility] because she is sick. ? ?Labs on Admission: I have personally reviewed following labs and imaging studies ? ?CBC: ?Recent Labs  ?Lab 01/18/22 ?1311  ?WBC 8.6  ?HGB 14.3  ?HCT 44.3  ?MCV 101.1*  ?PLT 200  ? ?Basic Metabolic Panel: ?Recent Labs  ?Lab 01/18/22 ?1311  ?NA 137  ?K 4.4  ?CL 102  ?CO2 26  ?GLUCOSE 110*  ?BUN 28*  ?CREATININE 0.98  ?CALCIUM 8.8*  ? ? ?Recent Labs  ?Lab 01/18/22 ?1505  ?AMMONIA 13  ? ? ?CBG: ?Recent Labs  ?Lab 01/18/22 ?1400  ?GLUCAP 99  ? ?Thyroid Function Tests: ?Recent Labs  ?  01/18/22 ?1923  ?TSH 1.348   ? ?Urine analysis: ?   ?Component Value Date/Time  ? [place] YELLOW 01/18/2022 1820  ? APPEARANCEUR CLEAR 01/18/2022 1820  ? LABSPEC 1.015 01/18/2022 1820  ? PHURINE 5.0 01/18/2022 1820  ? GLUCOSEU NEGATIVE 04

## 2022-01-18 NOTE — ED Triage Notes (Signed)
"  Lives at home, history of dementia and bed bound, but the last few days she has been much weaker than normal, not eating or drinking like she normally does. Complained of some chest pain this morning per her family, but denies any pain or symptoms at this time" per EMS ?On arrival patient denies any complaints, states she doesn't know why she is here.  ?Oriented to person and place only.  ?

## 2022-01-18 NOTE — ED Provider Notes (Signed)
?[facility] ?Provider Note ? ? ?CSN: [id number] ?Arrival date & time: 01/18/22  1235 ? ?  ? ?History ? ?Chief Complaint  ?Patient presents with  ? Weakness  ? ? ?Susan Davidson is a 71 y.o. female.  Patient presents to the hospital via EMS with concerns over altered mental status.  The patient's son accompanies the patient.  States the patient was home with home health aide when she asked for an ambulance and apparently complained of some mild chest pain.  At this time when questioned the patient states she has no complaints but does remember calling the ambulance.  The patient is very somnolent and barely answers questions.  The patient's son states that she is normally more alert than this but that she has been sleepier for the past couple of days and has had a smaller appetite than usual.  The patient has extensive past medical history including history of CVA with left-sided weakness, bipolar illness, history of confusion, history of UTIs requiring admission, history of acute metabolic encephalopathy, history of generalized weakness, chronic kidney disease stage III, daytime somnolence, hypersomnia, hypertension, depression, anxiety, hypothyroidism.  Patient was seen by primary care on April 6 due to symptoms of worsening generalized weakness, confusion, possible hallucination, sleeping all day with decreased appetite. ? ?HPI ? ?  ? ?Home Medications ?Prior to Admission medications   ?Medication Sig Start Date End Date Taking? Authorizing Provider  ?acetaminophen (TYLENOL) 325 MG tablet Take 2 tablets (650 mg total) by mouth every 6 (six) hours as needed for mild pain or headache (fever >/= 101). 08/04/21  Yes Roxan Hockey, MD  ?ALPRAZolam Duanne Moron) 0.5 MG tablet Take 1 tablet (0.5 mg total) by mouth in the morning, at noon, and at bedtime. ?Patient taking differently: Take 0.5 mg by mouth 4 (four) times daily. 08/04/21  Yes Emokpae, Courage, MD  ?amphetamine-dextroamphetamine (ADDERALL) 10  MG tablet Take 10 mg by mouth 2 (two) times daily. 11/02/21  Yes [provider]  ?ascorbic acid (VITAMIN C) 500 MG tablet Take 1 tablet (500 mg total) by mouth daily. 08/05/21  Yes Roxan Hockey, MD  ?aspirin EC 81 MG tablet Take 1 tablet (81 mg total) by mouth daily with breakfast. Swallow whole. 08/04/21  Yes Roxan Hockey, MD  ?clopidogrel (PLAVIX) 75 MG tablet Take 1 tablet (75 mg total) by mouth daily. 11/12/21  Yes Biagio Borg, MD  ?divalproex (DEPAKOTE) 250 MG DR tablet Take 1 tablet by mouth See admin instructions. 1 tablet in the morning and 2 tablets every evening 07/28/21  Yes [provider]  ?levothyroxine (SYNTHROID) 88 MCG tablet Take 1 tablet (88 mcg total) by mouth daily. 11/30/21  Yes Nida, Marella Chimes, MD  ?melatonin 5 MG TABS Take 5 mg by mouth.   Yes [provider]  ?memantine (NAMENDA) 5 MG tablet Take 1 tablet (5 mg total) by mouth 2 (two) times daily. 11/12/21  Yes Biagio Borg, MD  ?Multiple Vitamin (MULTIVITAMIN) tablet Take 1 tablet by mouth daily.   Yes [provider]  ?OVER THE COUNTER MEDICATION Take 1 tablet by mouth daily. magnesium   Yes [provider]  ?PARoxetine (PAXIL) 40 MG tablet Take 40 mg by mouth every morning. 01/01/22  Yes [provider]  ?polyethylene glycol (MIRALAX / GLYCOLAX) 17 g packet Take 17 g by mouth daily as needed for mild constipation. 08/04/21  Yes Roxan Hockey, MD  ?rosuvastatin (CRESTOR) 5 MG tablet Take 1 tablet (5 mg total) by mouth daily.  Hold Crestor while taking Paxlovid 11/01/21  Yes Biagio Borg, MD  ?vitamin B-12 (CYANOCOBALAMIN) 1000 MCG tablet Take 1 tablet (1,000 mcg total) by mouth daily. ?Patient not taking: Reported on 01/18/2022 03/14/20   Biagio Borg, MD  ?zinc sulfate 220 (50 Zn) MG capsule Take 1 capsule (220 mg total) by mouth daily. ?Patient not taking: Reported on 01/18/2022 08/05/21   Roxan Hockey, MD  ?   ? ?Allergies    ?Other, Epinephrine, Fire ant (solenopsis  [place]) allergy skin test, and Influenza vac split quad   ? ?Review of Systems   ?Review of Systems  ?Reason unable to perform ROS: Patient somnolent and not answering questions, AMS.  ? ?Physical Exam ?Updated Vital Signs ?BP 127/63   Pulse 82   Temp 100.2 ?F (37.9 ?C) (Rectal)   Resp 18   Ht 5\' 2"  (1.575 m)   Wt 59 kg   SpO2 97%   BMI 23.78 kg/m?  ?Physical Exam ?Vitals and nursing note reviewed.  ?Constitutional:   ?   Appearance: She is normal weight.  ?   Comments: Somnolent  ?HENT:  ?   Head: Normocephalic and atraumatic.  ?   Mouth/Throat:  ?   Mouth: Mucous membranes are moist.  ?Eyes:  ?   Conjunctiva/sclera: Conjunctivae normal.  ?   Pupils: Pupils are equal, round, and reactive to light.  ?Cardiovascular:  ?   Rate and Rhythm: Normal rate and regular rhythm.  ?   Pulses: Normal pulses.  ?Pulmonary:  ?   Effort: Pulmonary effort is normal. No respiratory distress.  ?   Breath sounds: Normal breath sounds.  ?Abdominal:  ?   Palpations: Abdomen is soft.  ?   Tenderness: There is no abdominal tenderness.  ?Skin: ?   General: Skin is warm and dry.  ?Neurological:  ?   Comments: Patient responding to verbal stimuli. Not answering questions. Somnolent  ? ? ?ED Results / Procedures / Treatments   ?Labs ?(all labs ordered are listed, but only abnormal results are displayed) ?Labs Reviewed  ?BASIC METABOLIC PANEL - Abnormal; Notable for the following components:  ?    Result Value  ? Glucose, Bld 110 (*)   ? BUN 28 (*)   ? Calcium 8.8 (*)   ? All other components within normal limits  ?CBC - Abnormal; Notable for the following components:  ? MCV 101.1 (*)   ? All other components within normal limits  ?LACTIC ACID, PLASMA - Abnormal; Notable for the following components:  ? Lactic Acid, Venous 2.0 (*)   ? All other components within normal limits  ?BLOOD GAS, VENOUS - Abnormal; Notable for the following components:  ? pO2, Ven <31 (*)   ? Bicarbonate 30.4 (*)   ? Acid-Base Excess 3.4 (*)   ? All other  components within normal limits  ?RESP PANEL BY RT-PCR (FLU A&B, COVID) [id number]  ?URINALYSIS, ROUTINE W REFLEX MICROSCOPIC  ?LACTIC ACID, PLASMA  ?AMMONIA  ?ETHANOL  ?TSH  ?RAPID URINE DRUG SCREEN, HOSP PERFORMED  ?CBG MONITORING, ED  ?TROPONIN I (HIGH SENSITIVITY)  ?TROPONIN I (HIGH SENSITIVITY)  ? ? ?EKG ?EKG Interpretation ? ?Date/Time:  Tuesday January 18 2022 12:46:37 EDT ?Ventricular Rate:  90 ?PR Interval:  185 ?QRS Duration: 85 ?QT Interval:  363 ?QTC Calculation: 445 ?R Axis:   64 ?Text Interpretation: Sinus rhythm Low voltage, extremity and precordial leads No acute changes No significant change since last tracing Confirmed by Varney Biles [phone]) on 01/18/2022 2:02:45 PM ? ?  Radiology ?MR Brain Wo Contrast (neuro protocol) ? ?Result Date: 01/18/2022 ?CLINICAL DATA:  Mental status change, unknown cause EXAM: MRI HEAD WITHOUT CONTRAST TECHNIQUE: Multiplanar, multiecho pulse sequences of the brain and surrounding structures were obtained without intravenous contrast. COMPARISON:  April 2022 FINDINGS: DWI, sagittal T1, and axial T2 sequences were obtained. Patient could not tolerate remainder of the study. Brain: There is no acute infarction. There is no intracranial mass, mass effect, or edema. There is no hydrocephalus or extra-axial fluid collection. Prominence of the ventricles and sulci reflects similar parenchymal volume loss. Chronic infarct of the parasagittal right parietal lobe with minimal posterior frontal extension. Chronic infarcts of the central gray and white matter as well as bilateral cerebellar hemispheres. Additional areas of T2 hyperintensity in the supratentorial white matter probably reflect chronic microvascular ischemic changes. Vascular: Major vessel flow voids at the skull base are preserved. Skull and upper cervical spine: Normal marrow signal is preserved. Sinuses/Orbits: Paranasal sinuses are aerated. Orbits are unremarkable. Other: Sella is unremarkable.  Mastoid air cells are  clear. IMPRESSION: Patient could not tolerate all sequences. No acute infarction. Chronic infarcts and chronic microvascular ischemic changes. Electronically Signed   By: Macy Mis M.D.   On: 01/18/2022 16:56  ? ?DG

## 2022-01-18 NOTE — ED Notes (Signed)
Remains lethargic, unable to stay awake for more than a few seconds of communication at a time. Spouse at the bedside. Purewick in place to low wall suction with no urine output noted. Brief dry.  ? ?

## 2022-01-18 NOTE — Assessment & Plan Note (Signed)
Blood pressure transiently soft, likely from dehydration.  Hydrate.  Not on antihypertensives. ?

## 2022-01-18 NOTE — Assessment & Plan Note (Addendum)
Intermittently somnolent, baseline dementia.  Tmax 100.2.  Rules out for sepsis.  Lactic acid 1.8 > 2.  Chest x-ray clear, UA not suggestive of infection.  MRI brain without acute findings, though limited as patient could not tolerate all sequences.  Poor oral intake in the past 2 days, likely dehydrated.  Ammonia unremarkable.  pH on VBG 7.34.  COVID and influenza test negative.  ?-Prior to arrival, reported chest pain and abdominal pain, EKG troponin unremarkable, abdomen benign. ?-Follow-up blood cultures ?-Check Depakote level ?-Lactic acidosis of 2 likely from dehydration, hydrate and trend ?-1 L bolus given, continue D5 N/s 100cc/hr x 20 hrs ?-N.p.o. for now until improvement in mental status. ?-Hold home psychoactive medications Depakote, memantine, Xanax, Adderall, paroxetine ?

## 2022-01-18 NOTE — ED Notes (Addendum)
Remains lethargic. Aroused with moderate verbal stimulation, but goes right back to sleep when not stimulated. Spouse at bedside. Side rails up. Call bell in reach.  ?

## 2022-01-18 NOTE — ED Triage Notes (Addendum)
During triage, noticed left sided facial droop and weakness in left arm and leg. Looked back in history and noted left sided weakness from prior CVA in history.  Added to current history.  ?

## 2022-01-18 NOTE — Assessment & Plan Note (Signed)
With residual deficits.  Hold Crestor, aspirin and Plavix for now while NPO ?

## 2022-01-18 NOTE — Assessment & Plan Note (Signed)
Hold home psychoactive medications Adderall, Depakote Xanax for now, with altered mental status ?

## 2022-01-18 NOTE — ED Notes (Signed)
ED Provider at bedside. 

## 2022-01-18 NOTE — ED Notes (Signed)
To MRI via stretcher  

## 2022-01-19 DIAGNOSIS — G9341 Metabolic encephalopathy: Secondary | ICD-10-CM | POA: Diagnosis not present

## 2022-01-19 LAB — BASIC METABOLIC PANEL
Anion gap: 5 (ref 5–15)
BUN: 21 mg/dL (ref 8–23)
CO2: 25 mmol/L (ref 22–32)
Calcium: 8.4 mg/dL — ABNORMAL LOW (ref 8.9–10.3)
Chloride: 111 mmol/L (ref 98–111)
Creatinine, Ser: 0.76 mg/dL (ref 0.44–1.00)
GFR, Estimated: >60 mL/min (ref >60)
Glucose, Bld: 131 mg/dL — ABNORMAL HIGH (ref 70–99)
Potassium: 3.8 mmol/L (ref 3.5–5.1)
Sodium: 141 mmol/L (ref 135–145)

## 2022-01-19 LAB — CBC
HCT: 38.2 % (ref 36.0–46.0)
Hemoglobin: 12.4 g/dL (ref 12.0–15.0)
MCH: 33 pg (ref 26.0–34.0)
MCHC: 32.5 g/dL (ref 30.0–36.0)
MCV: 101.6 fL — ABNORMAL HIGH (ref 80.0–100.0)
Platelets: 179 10*3/uL (ref 150–400)
RBC: 3.76 MIL/uL — ABNORMAL LOW (ref 3.87–5.11)
RDW: 14.6 % (ref 11.5–15.5)
WBC: 6.1 10*3/uL (ref 4.0–10.5)
nRBC: 0 % (ref 0.0–0.2)

## 2022-01-19 NOTE — Progress Notes (Signed)
?  Transition of Care (TOC) Screening Note ? ? ?Patient Details  ?Name: Susan Davidson ?Date of Birth: 1951-04-29 ? ? ?Transition of Care (TOC) CM/SW Contact:    ?Tariya Morrissette D, LCSW ?Phone Number: ?01/19/2022, 12:27 PM ? ? ? ?Transition of Care Department [facility]) has reviewed patient and no TOC needs have been identified at this time. We will continue to monitor patient advancement through interdisciplinary progression rounds. If new patient transition needs arise, please place a TOC consult. ? ? ?

## 2022-01-19 NOTE — Progress Notes (Signed)
?PROGRESS NOTE ? ? ? ?Susan Davidson  FIE:[id number] DOB: 1951/02/15 DOA: 01/18/2022 ?PCP: Biagio Borg, MD ? ? ?Brief Narrative:  ? Susan Davidson is a 71 y.o. female with medical history significant for dementia, CKD 3, CVA with residual left-sided hemiparesis, hypertension and depression.  Patient presented to the ED for altered mental status, appears to be intermittently somnolent per husband at bedside worsening over the past 2 days prior to admission.  Denies any recent lifestyle dietary or medication changes over the past few weeks to months. ? ?Assessment & Plan: ?  ?Principal Problem: ?  Acute metabolic encephalopathy ?Active Problems: ?  Depression ?  Essential hypertension ?  CVA (cerebral vascular accident) [facility]) ? ?Acute metabolic encephalopathy ?Likely polypharmacy in nature, POA ?Patient remains on multiple CNS acting medications -Per most recent med reconciliation: Alprazolam, Adderall, Depakote, Namenda, Paxil.   ?-We will discontinue alprazolam and Adderall at this time continue Depakote Namenda and Paxil was able to pass speech evaluation ?-Patient does not meet sepsis criteria, has no complaints and is more awake alert and oriented today with supportive care and cessation of multiple CNS depressants. ?-Depakote level 29(low) -unclear if secondary to noncompliance versus subtherapeutic on current dose, would recommend continuing current dose and follow-up with PCP for recheck in the next 1 to 2 weeks for adjustment ? ?History of CVA (cerebral vascular accident) ([place]) ?With residual deficits.  Hold Crestor, aspirin and Plavix for now while NPO ?  ?Essential hypertension ?Blood pressure transiently soft, likely from dehydration.  Hydrate.  Not on antihypertensives. ?  ?Depression ?Hold home psychoactive medications Adderall, Depakote Xanax for now, with altered mental status given above ? ? ?DVT prophylaxis: Lovenox ?Code Status: Full ?Family Communication: Husband at bedside ? ?Status is:  Inpatient ? ?Dispo: The patient is from: Home ?             Anticipated d/c is to: To be determined ?             Anticipated d/c date is: 24 to 48 hours pending clinical course ?             Patient currently not medically stable for discharge ? ?Consultants:  ?None ? ?Procedures:  ?None ? ?Antimicrobials:  ?None ? ?Subjective: ?No acute issues or events overnight, review of systems still markedly limited given patient's somnolence ? ?Objective: ?Vitals:  ? 01/18/22 2127 01/18/22 2140 01/18/22 2239 01/19/22 0459  ?BP: (!) 127/58 (!) 111/51 (!) 142/65 139/71  ?Pulse: 79 79 80 73  ?Resp: 17 (!) 79 17 19  ?Temp:  98.5 ?F (36.9 ?C) (!) 97.4 ?F (36.3 ?C) (!) 97 ?F (36.1 ?C)  ?TempSrc:  Oral Oral   ?SpO2: 100% 99% 97% 93%  ?Weight:   54 kg   ?Height:   5\' 2"  (1.575 m)   ? ? ?Intake/Output Summary (Last 24 hours) at 01/19/2022 0742 ?Last data filed at 01/19/2022 0735 ?Gross per 24 hour  ?Intake 1828.57 ml  ?Output 500 ml  ?Net 1328.57 ml  ? ?Filed Weights  ? 01/18/22 1252 01/18/22 2239  ?Weight: 59 kg 54 kg  ? ? ?Examination: ? ?General exam: Appears calm and comfortable  ?Respiratory system: Clear to auscultation. Respiratory effort normal. ?Cardiovascular system: S1 & S2 heard, RRR. No JVD, murmurs, rubs, gallops or clicks. No pedal edema. ?Gastrointestinal system: Abdomen is nondistended, soft and nontender. No organomegaly or masses felt. Normal bowel sounds heard. ?Central nervous system: Alert and oriented. No focal neurological deficits. ?Extremities: Symmetric 5 x  5 power. ?Skin: No rashes, lesions or ulcers ?Psychiatry: Judgement and insight appear normal. Mood & affect appropriate.  ? ? ? ?Data Reviewed: I have personally reviewed following labs and imaging studies ? ?CBC: ?Recent Labs  ?Lab 01/18/22 ?1311 01/19/22 ?7628  ?WBC 8.6 6.1  ?HGB 14.3 12.4  ?HCT 44.3 38.2  ?MCV 101.1* 101.6*  ?PLT 200 179  ? ?Basic Metabolic Panel: ?Recent Labs  ?Lab 01/18/22 ?1311 01/19/22 ?3151  ?NA 137 141  ?K 4.4 3.8  ?CL 102 111   ?CO2 26 25  ?GLUCOSE 110* 131*  ?BUN 28* 21  ?CREATININE 0.98 0.76  ?CALCIUM 8.8* 8.4*  ? ?GFR: ?Estimated Creatinine Clearance: 50.3 mL/min (by C-G formula based on SCr of 0.76 mg/dL). ?Liver Function Tests: ?No results for input(s): AST, ALT, ALKPHOS, BILITOT, PROT, ALBUMIN in the last 168 hours. ?No results for input(s): LIPASE, AMYLASE in the last 168 hours. ?Recent Labs  ?Lab 01/18/22 ?1505  ?AMMONIA 13  ? ?Coagulation Profile: ?No results for input(s): INR, PROTIME in the last 168 hours. ?Cardiac Enzymes: ?No results for input(s): CKTOTAL, CKMB, CKMBINDEX, TROPONINI in the last 168 hours. ?BNP (last 3 results) ?No results for input(s): PROBNP in the last 8760 hours. ?HbA1C: ?No results for input(s): HGBA1C in the last 72 hours. ?CBG: ?Recent Labs  ?Lab 01/18/22 ?1400 01/18/22 ?2135  ?GLUCAP 99 139*  ? ?Lipid Profile: ?No results for input(s): CHOL, HDL, LDLCALC, TRIG, CHOLHDL, LDLDIRECT in the last 72 hours. ?Thyroid Function Tests: ?Recent Labs  ?  01/18/22 ?1923  ?TSH 1.348  ? ?Anemia Panel: ?No results for input(s): VITAMINB12, FOLATE, FERRITIN, TIBC, IRON, RETICCTPCT in the last 72 hours. ?Sepsis Labs: ?Recent Labs  ?Lab 01/18/22 ?1311 01/18/22 ?1456 01/18/22 ?2211  ?LATICACIDVEN 1.8 2.0* 1.0  ? ? ?Recent Results (from the past 240 hour(s))  ?Urine culture     Status: None  ? Collection Time: 01/10/22 12:58 PM  ? Specimen: Urine  ?Result Value Ref Range Status  ? Source: NOT GIVEN  Final  ? Status: FINAL  Final  ? Isolate 1:   Final  ?  Mixed genital flora isolated. These superficial bacteria are not indicative of a urinary tract infection. No further organism identification is warranted on this specimen. If clinically indicated, recollect clean-catch, mid-stream urine and transfer  ?immediately to Urine Culture Transport Tube. ?  ?Culture, blood (Routine X 2) w Reflex to ID Panel     Status: None (Preliminary result)  ? Collection Time: 01/18/22  1:11 PM  ? Specimen: Left Antecubital; Blood  ?Result  Value Ref Range Status  ? Specimen Description LEFT ANTECUBITAL  Final  ? Special Requests   Final  ?  BOTTLES DRAWN AEROBIC AND ANAEROBIC Blood Culture adequate volume ?Performed at [facility], [address], [place], [place] [address] ?  ? Culture PENDING  Incomplete  ? Report Status PENDING  Incomplete  ?Resp Panel by RT-PCR (Flu A&B, Covid) Nasopharyngeal Swab     Status: None  ? Collection Time: 01/18/22  3:05 PM  ? Specimen: Nasopharyngeal Swab; Nasopharyngeal(NP) swabs in vial transport medium  ?Result Value Ref Range Status  ? SARS Coronavirus 2 by RT PCR NEGATIVE NEGATIVE Final  ?  Comment: (NOTE) ?SARS-CoV-2 target nucleic acids are NOT DETECTED. ? ?The SARS-CoV-2 RNA is generally detectable in upper respiratory ?specimens during the acute phase of infection. The lowest ?concentration of SARS-CoV-2 viral copies this assay can detect is ?138 copies/mL. A negative result does not preclude SARS-Cov-2 ?infection and should not be used  as the sole basis for treatment or ?other patient management decisions. A negative result may occur with  ?improper specimen collection/handling, submission of specimen other ?than nasopharyngeal swab, presence of viral mutation(s) within the ?areas targeted by this assay, and inadequate number of viral ?copies(<138 copies/mL). A negative result must be combined with ?clinical observations, patient history, and epidemiological ?information. The expected result is Negative. ? ?Fact Sheet for Patients:  ?EntrepreneurPulse.com.au ? ?Fact Sheet for Healthcare Providers:  ?IncredibleEmployment.be ? ?This test is no t yet approved or cleared by the [place] FDA and  ?has been authorized for detection and/or diagnosis of SARS-CoV-2 by ?FDA under an Emergency Use Authorization (EUA). This EUA will remain  ?in effect (meaning this test can be used) for the duration of the ?COVID-19 declaration under Section 564(b)(1) of the Act, 21 ?U.S.C.section  360bbb-3(b)(1), unless the authorization is terminated  ?or revoked sooner.  ? ? ?  ? Influenza A by PCR NEGATIVE NEGATIVE Final  ? Influenza B by PCR NEGATIVE NEGATIVE Final  ?  Comment: (NOTE) ?[employer]

## 2022-01-20 DIAGNOSIS — G9341 Metabolic encephalopathy: Secondary | ICD-10-CM | POA: Diagnosis not present

## 2022-01-20 LAB — COMPREHENSIVE METABOLIC PANEL
ALT: 11 U/L (ref 0–44)
AST: 12 U/L — ABNORMAL LOW (ref 15–41)
Albumin: 2.8 g/dL — ABNORMAL LOW (ref 3.5–5.0)
Alkaline Phosphatase: 41 U/L (ref 38–126)
Anion gap: 7 (ref 5–15)
BUN: 15 mg/dL (ref 8–23)
CO2: 24 mmol/L (ref 22–32)
Calcium: 8.9 mg/dL (ref 8.9–10.3)
Chloride: 111 mmol/L (ref 98–111)
Creatinine, Ser: 0.65 mg/dL (ref 0.44–1.00)
GFR, Estimated: >60 mL/min (ref >60)
Glucose, Bld: 87 mg/dL (ref 70–99)
Potassium: 3.8 mmol/L (ref 3.5–5.1)
Sodium: 142 mmol/L (ref 135–145)
Total Bilirubin: 0.4 mg/dL (ref 0.3–1.2)
Total Protein: 6.1 g/dL — ABNORMAL LOW (ref 6.5–8.1)

## 2022-01-20 LAB — CBC
HCT: 42 % (ref 36.0–46.0)
Hemoglobin: 13.6 g/dL (ref 12.0–15.0)
MCH: 32.9 pg (ref 26.0–34.0)
MCHC: 32.4 g/dL (ref 30.0–36.0)
MCV: 101.4 fL — ABNORMAL HIGH (ref 80.0–100.0)
Platelets: 168 10*3/uL (ref 150–400)
RBC: 4.14 MIL/uL (ref 3.87–5.11)
RDW: 14.5 % (ref 11.5–15.5)
WBC: 5.7 10*3/uL (ref 4.0–10.5)
nRBC: 0 % (ref 0.0–0.2)

## 2022-01-20 MED ORDER — LEVOTHYROXINE SODIUM 88 MCG PO TABS
88.0000 ug | ORAL_TABLET | Freq: Every day | ORAL | Status: DC
  Administered 2022-01-20 – 2022-01-25 (×6): 88 ug via ORAL
  Filled 2022-01-20 (×6): qty 1

## 2022-01-20 MED ORDER — MEMANTINE HCL 10 MG PO TABS
5.0000 mg | ORAL_TABLET | Freq: Two times a day (BID) | ORAL | Status: DC
  Administered 2022-01-20 – 2022-01-25 (×11): 5 mg via ORAL
  Filled 2022-01-20 (×11): qty 1

## 2022-01-20 MED ORDER — DIVALPROEX SODIUM 250 MG PO DR TAB
250.0000 mg | DELAYED_RELEASE_TABLET | Freq: Every day | ORAL | Status: DC
  Administered 2022-01-20 – 2022-01-25 (×6): 250 mg via ORAL
  Filled 2022-01-20 (×6): qty 1

## 2022-01-20 MED ORDER — ASPIRIN EC 81 MG PO TBEC
81.0000 mg | DELAYED_RELEASE_TABLET | Freq: Every day | ORAL | Status: DC
Start: 2022-01-21 — End: 2022-01-25
  Administered 2022-01-21 – 2022-01-25 (×5): 81 mg via ORAL
  Filled 2022-01-20 (×5): qty 1

## 2022-01-20 MED ORDER — CLOPIDOGREL BISULFATE 75 MG PO TABS
75.0000 mg | ORAL_TABLET | Freq: Every day | ORAL | Status: DC
  Administered 2022-01-20 – 2022-01-25 (×6): 75 mg via ORAL
  Filled 2022-01-20 (×6): qty 1

## 2022-01-20 MED ORDER — DIVALPROEX SODIUM 250 MG PO DR TAB
500.0000 mg | DELAYED_RELEASE_TABLET | Freq: Every day | ORAL | Status: DC
  Administered 2022-01-20 – 2022-01-24 (×5): 500 mg via ORAL
  Filled 2022-01-20 (×5): qty 2

## 2022-01-20 MED ORDER — PAROXETINE HCL 20 MG PO TABS
40.0000 mg | ORAL_TABLET | Freq: Every morning | ORAL | Status: DC
  Administered 2022-01-20 – 2022-01-25 (×6): 40 mg via ORAL
  Filled 2022-01-20 (×6): qty 2

## 2022-01-20 MED ORDER — DIVALPROEX SODIUM 250 MG PO DR TAB
250.0000 mg | DELAYED_RELEASE_TABLET | ORAL | Status: DC
Start: 2022-01-20 — End: 2022-01-20

## 2022-01-20 NOTE — Care Management Obs Status (Signed)
MEDICARE OBSERVATION STATUS NOTIFICATION ? ? ?Patient Details  ?Name: Susan Davidson ?MRN: [id number] ?Date of Birth: [birth year] ? ? ?Medicare Observation Status Notification Given:  Yes ? ? ? ?Tommy Medal ?01/20/2022, 8:50 AM ?

## 2022-01-20 NOTE — TOC Initial Note (Signed)
Transition of Care (TOC) - Initial/Assessment Note  ? ? ?Patient Details  ?Name: Susan Davidson ?MRN: [id number] ?Date of Birth: [birth year] ? ?Transition of Care (TOC) CM/SW Contact:    ?Shade Flood, LCSW ?Phone Number: ?01/20/2022, 4:38 PM ? ?Clinical Narrative:                 ? ?Pt admitted from home. PT recommending SNF rehab. Unable to fully assess pt at bedside. Unable to reach pt's husband by phone. Spoke with pt's second family contact, her sister to assess pt's prior level of function. Pt's sister states that pt had been in SNF rehab/LTC for nine months and then discharged home in January of this year. Pt's sister did stay for several months to assist. Pt has a hospital bed, walker, w/c, BSC. She does not have a [employer] fast streamer. Pt has had CAP aides on and off when they are available and she has had some home repairs completed through the [facility] funding. ? ?It sounds like pt's husband has his own business and is often gone taking care of business. It appears that when an aide is not there while husband is at work, pt is alone. Pt is not able to get up or ambulate on her own. It appears strongly that pt would benefit from SNF transfer at dc. Pt's sister thinks pt may refuse. Sister asking about possible HH if pt refuses SNF. ? ?TOC will follow up tomorrow and further assist with dc planning.  ? ?Expected Discharge Plan: [place] ?Barriers to Discharge: Continued Medical Work up ? ? ?Patient Goals and CMS Choice ?  ?  ?  ? ?Expected Discharge Plan and Services ?Expected Discharge Plan: [place] ?In-house Referral: Clinical Social Work ?  ?  ?Living arrangements for the past 2 months: [place] ?                ?  ?  ?  ?  ?  ?  ?  ?  ?  ?  ? ?Prior Living Arrangements/Services ?Living arrangements for the past 2 months: [place] ?Lives with:: Spouse ?Patient language and need for interpreter reviewed:: Yes ?       ?Need for Family Participation in Patient Care:  Yes (Comment) ?Care giver support system in place?: Yes (comment) ?  ?Criminal Activity/Legal Involvement Pertinent to Current Situation/Hospitalization: No - Comment as needed ? ?Activities of Daily Living ?  ?  ? ?Permission Sought/Granted ?  ?  ?   ?   ?   ?   ? ?Emotional Assessment ?  ?  ?  ?Orientation: : Oriented to Self ?Alcohol / Substance Use: Not Applicable ?Psych Involvement: No (comment) ? ?Admission diagnosis:  Somnolence [R40.0] ?Weakness [R53.1] ?Altered mental status, unspecified altered mental status type [R41.82] ?Acute metabolic encephalopathy [X52.84] ?Patient Active Problem List  ? Diagnosis Date Noted  ? Confusion 01/08/2022  ? Bipolar illness ([place]) 01/06/2022  ? Gross hematuria 11/29/2021  ? Bilateral hearing loss 10/29/2021  ? History of stroke 10/29/2021  ? Nail disorder 10/29/2021  ? Unresponsiveness 08/02/2021  ? Acute metabolic encephalopathy 13/24/4010  ? CKD (chronic kidney disease) stage 3, GFR 30-59 ml/min (HCC) 05/04/2021  ? COVID-19 virus infection 05/04/2021  ? Lower urinary tract infectious disease   ? Generalized weakness 01/27/2021  ? Spinal stenosis at L4-L5 level 01/27/2021  ? CVA (cerebral vascular accident) ([place]) 01/27/2021  ? Ambulatory dysfunction 01/26/2021  ? Tobacco use disorder 01/26/2021  ? CKD (chronic  kidney disease) 11/15/2020  ? B12 deficiency 11/09/2020  ? Urinary frequency 11/02/2018  ? Daytime somnolence 10/19/2012  ? Impaired glucose tolerance 08/27/2011  ? Encounter for well adult exam with abnormal findings 08/27/2011  ? Vitamin D deficiency 04/14/2010  ? HYPERSOMNIA 02/19/2009  ? Hypothyroidism 02/12/2008  ? HLD (hyperlipidemia) 02/12/2008  ? ANXIETY 02/12/2008  ? Depression 02/12/2008  ? Essential hypertension 02/12/2008  ? OSTEOPENIA 02/12/2008  ? ?PCP:  Biagio Borg, MD ?Pharmacy:   ?[facility], [address] ?[place] [address] ?Phone: [phone] Fax:  [phone] ? ? ? ? ?Social Determinants of Health (SDOH) Interventions ?  ? ?Readmission Risk Interventions ? ?  01/29/2021  ? 12:39 PM  ?Readmission Risk Prevention Plan  ?Medication Screening Complete  ?Transportation Screening Complete  ? ? ? ?

## 2022-01-20 NOTE — Plan of Care (Signed)
?  Problem: Acute Rehab PT Goals(only PT should resolve) ?Goal: Pt will Roll Supine to Side ?Outcome: Progressing ?Flowsheets (Taken 01/20/2022 1233) ?Pt will Roll Supine to Side: with min assist ?Goal: Pt Will Go Supine/Side To Sit ?Outcome: Progressing ?Flowsheets (Taken 01/20/2022 1233) ?Pt will go Supine/Side to Sit: ? with moderate assist ? with HOB elevated ?Goal: Pt Will Go Sit To Supine/Side ?Outcome: Progressing ?Flowsheets (Taken 01/20/2022 1233) ?Pt will go Sit to Supine/Side: ? with minimal assist ? with HOB  elevated ?Goal: Patient Will Perform Sitting Balance ?Outcome: Progressing ?Flowsheets (Taken 01/20/2022 1233) ?Patient will perform sitting balance: ? 1-2 min ? with minimal assist ? with unilateral UE support ?Goal: Patient Will Transfer Sit To/From Stand ?Outcome: Progressing ?Flowsheets (Taken 01/20/2022 1233) ?Patient will transfer sit to/from stand: with minimal assist ?Goal: Pt Will Transfer Bed To Chair/Chair To Bed ?Outcome: Progressing ?Flowsheets (Taken 01/20/2022 1233) ?Pt will Transfer Bed to Chair/Chair to Bed: with mod assist ?  ?Floria Raveling. Hartnett-Rands, MS, PT ?Per [place] ?[place] #[id number] ?01/20/2022 ? ?

## 2022-01-20 NOTE — Evaluation (Signed)
Physical Therapy Evaluation ?Patient Details ?Name: Susan Davidson ?MRN: [id number] ?DOB: 1951-07-13 ?Today's Date: 01/20/2022 ? ?History of Present Illness ? Susan Davidson is a 71 y.o. female with medical history significant for dementia, CKD 3, CVA with residual left-sided hemiparesis, hypertension and depression.  Patient presented to the ED for altered mental status, appears to be intermittently somnolent per husband at bedside worsening over the past 2 days prior to admission.  Denies any recent lifestyle dietary or medication changes over the past few weeks to months.  ? ?Clinical Impression ?   ? Patient agreeable to participating in PT evaluation today. Patient requires mod to max assist for bed mobility and transfers. Patient requires cues for sequencing of steps and placement of hands with use of hemi-walker for transfers and attempted side-stepping. Patient's ability limited by new onset right arm pain today. Nursing informed. Attempted ambulation with max assist and assist with unweighting right or left lower extremity. Patient unable to sidestep.  Mutliple cues for sequencing of steps extra time for slower processing speeds. Decreased left ankle dorsiflexion noted in standing.  ? ?Recommendations for follow up therapy are one component of a multi-disciplinary discharge planning process, led by the attending physician.  Recommendations may be updated based on patient status, additional functional criteria and insurance authorization. ? ?Follow Up Recommendations Skilled nursing-short term rehab (<3 hours/day) ? ?  ?Assistance Recommended at Discharge Frequent or constant Supervision/Assistance  ?Patient can return home with the following ? A lot of help with walking and/or transfers;A lot of help with bathing/dressing/bathroom;Assistance with feeding;Assist for transportation;Direct supervision/assist for medications management;Help with stairs or ramp for entrance;Direct supervision/assist for financial  management;Assistance with cooking/housework ? ?  ?Equipment Recommendations  (hemi-walker)  ?Recommendations for Other Services ?    ?  ?Functional Status Assessment Patient has had a recent decline in their functional status and demonstrates the ability to make significant improvements in function in a reasonable and predictable amount of time.  ? ?  ?Precautions / Restrictions Precautions ?Precautions: Fall ?Restrictions ?Weight Bearing Restrictions: No  ? ?  ? ?Mobility ? Bed Mobility ?Overal bed mobility: Needs Assistance ?Bed Mobility: Supine to Sit, Sit to Supine ?  ?  ?Supine to sit: Mod assist, HOB elevated ?Sit to supine: Mod assist ?  ?General bed mobility comments: limited by right arm pain ?  ? ?Transfers ?Overall transfer level: Needs assistance ?Equipment used: Hemi-walker ?Transfers: Sit to/from Stand ?Sit to Stand: Mod assist ?  ?General transfer comment: cues for sequencing of steps and placement of hands with use of hemi-walker; ability limited by new onset right arm pain today ?  ? ?Ambulation/Gait ?  ?Gait Distance (Feet): 0 Feet ?Assistive device: Hemi-walker ?Gait Pattern/deviations: Wide base of support, Decreased step length - right, Decreased step length - left, Decreased weight shift to left, Decreased weight shift to right, Knee hyperextension - left ?Gait velocity: decreased ?  ?  ?General Gait Details: with max assist and assist with unweighting right or left lower extremity, patient unable to sidestep; mutliple cues for sequencing of steps extra time for slower processing speeds; decreased left ankle dorsiflexion noted in standing. ? ?Stairs ?  ?  ? ?Wheelchair Mobility ?  ? ?Modified Rankin (Stroke Patients Only) ?  ? ?  ? ?Balance Overall balance assessment: Needs assistance ?Sitting-balance support: Single extremity supported, Feet supported ?Sitting balance-Leahy Scale: Poor ?  ?Postural control: Left lateral lean ?Standing balance support: Single extremity supported, During  functional activity, Reliant on assistive device for balance ?  Standing balance-Leahy Scale: Poor ?  ?  ?   ? ? ? ?Pertinent Vitals/Pain Pain Assessment ?Pain Assessment: Faces ?Faces Pain Scale: Hurts whole lot ?Pain Location: Right arm ?Pain Descriptors / Indicators: Constant, Sore ?Pain Intervention(s): Limited activity within patient's tolerance, Monitored during session, Repositioned, Patient requesting pain meds-RN notified  ? ? ?Home Living Family/patient expects to be discharged to:: Private residence ?Living Arrangements: Spouse/significant other ?Available Help at Discharge: Personal care attendant;Family;Available 24 hours/day ?Type of Home: House ?Home Access: Stairs to enter ?  ?Entrance Stairs-Number of Steps: 2 ?  ?Home Layout: Able to live on main level with bedroom/bathroom ?Home Equipment: BSC/3in1;Wheelchair - manual ?   ?  ?Prior Function Prior Level of Function : Needs assist ?  ?  ?  ?Physical Assist : Mobility (physical);ADLs (physical) ?Mobility (physical): Bed mobility;Transfers;Gait;Stairs ?ADLs (physical): Grooming;Bathing;Dressing;Toileting;IADLs ?Mobility Comments: patient reports largely staying hospital bed; no transfers to chair during the day; husband performs transfers to wheelchair and navigates 2 entry stairs with wheelchair; awaiting ramp to be built ?  ?  ? ? ?Hand Dominance  ? Dominant Hand: Right ? ?  ?Extremity/Trunk Assessment  ? Upper Extremity Assessment ?Upper Extremity Assessment: Defer to OT evaluation ?  ? ?Lower Extremity Assessment ?Lower Extremity Assessment: Generalized weakness;LLE deficits/detail ?LLE Deficits / Details: left hemiparesis with history of stroke 08/2021 ?LLE Coordination: decreased gross motor;decreased fine motor ?  ? ?Cervical / Trunk Assessment ?Cervical / Trunk Assessment: Kyphotic  ?Communication  ? Communication: No difficulties  ?Cognition Arousal/Alertness: Awake/alert ?Behavior During Therapy: [facility] for tasks assessed/performed ?Overall  Cognitive Status: Within Functional Limits for tasks assessed ?  ?  ? ?  ?General Comments   ? ?  ?Exercises    ? ?Assessment/Plan  ?  ?PT Assessment Patient needs continued PT services  ?PT Problem List Decreased strength;Decreased mobility;Decreased range of motion;Decreased activity tolerance;Decreased balance;Decreased knowledge of use of DME;Pain;Decreased cognition ? ?   ?  ?PT Treatment Interventions DME instruction;Therapeutic exercise;Gait training;Balance training;Neuromuscular re-education;Cognitive remediation;Functional mobility training;Therapeutic activities;Patient/family education   ? ?PT Goals (Current goals can be found in the Care Plan section)  ?Acute Rehab PT Goals ?Patient Stated Goal: Return home with husband and personal care assistant care. ?PT Goal Formulation: With patient ?Time For Goal Achievement: 02/03/22 ?Potential to Achieve Goals: Fair ? ?  ?Frequency Min 3X/week ?  ? ? ?   ?AM-PAC PT "6 Clicks" Mobility  ?Outcome Measure Help needed turning from your back to your side while in a flat bed without using bedrails?: A Little ?Help needed moving from lying on your back to sitting on the side of a flat bed without using bedrails?: A Lot ?Help needed moving to and from a bed to a chair (including a wheelchair)?: Total ?Help needed standing up from a chair using your arms (e.g., wheelchair or bedside chair)?: A Lot ?Help needed to walk in hospital room?: Total ?Help needed climbing 3-5 steps with a railing? : Total ?6 Click Score: 10 ? ?  ?End of Session Equipment Utilized During Treatment: Gait belt ?Activity Tolerance: Patient limited by lethargy;Patient limited by fatigue ?Patient left: in bed;with call bell/phone within reach;with bed alarm set ?Nurse Communication: Mobility status;Patient requests pain meds ?PT Visit Diagnosis: Unsteadiness on feet (R26.81);Other abnormalities of gait and mobility (R26.89);Muscle weakness (generalized) (M62.81);Difficulty in walking, not elsewhere  classified (R26.2);Other symptoms and signs involving the nervous system (R29.898);Hemiplegia and hemiparesis ?Hemiplegia - Right/Left: Left ?Hemiplegia - dominant/non-dominant: Non-dominant ?Hemiplegia - caused b

## 2022-01-20 NOTE — Progress Notes (Signed)
?PROGRESS NOTE ? ? ? ?Susan Davidson  GXQ:[id number] DOB: 1951/01/04 DOA: 01/18/2022 ?PCP: Biagio Borg, MD ? ? ?Brief Narrative:  ? Susan Davidson is a 71 y.o. female with medical history significant for dementia, CKD 3, CVA with residual left-sided hemiparesis, hypertension and depression.  Patient presented to the ED for altered mental status, appears to be intermittently somnolent per husband at bedside worsening over the past 2 days prior to admission.  Denies any recent lifestyle dietary or medication changes over the past few weeks to months. ? ?Assessment & Plan: ?  ?Principal Problem: ?  Acute metabolic encephalopathy ?Active Problems: ?  Depression ?  Essential hypertension ?  CVA (cerebral vascular accident) [facility]) ? ?Acute metabolic encephalopathy, resolving ?Likely polypharmacy in nature, POA ?Patient remains on multiple CNS acting medications  ?-Per most recent med reconciliation: Alprazolam, Adderall, Depakote, Namenda, Paxil.   ?-We will discontinue alprazolam and Adderall at this time continue Depakote Namenda and Paxil was able to pass speech evaluation ?-Patient does not meet sepsis criteria, has no complaints and is more awake alert and oriented today with supportive care and cessation of multiple CNS depressants. ?-Depakote level 29(low) -unclear if secondary to noncompliance versus subtherapeutic on current dose, would recommend continuing current dose and follow-up with PCP for recheck in the next 1 to 2 weeks for adjustment ?-Patient drastically improving over the past 48 hours, family at bedside indicates patient is approaching baseline but not fully resolved ? ?Acute on chronic ambulatory dysfunction  ?-PT OT following, currently recommending SNF placement given ambulatory dysfunction ?-We will follow-up with family to discuss possible transition to SNF versus home with home health as she already has home care agency available at home.  Unclear if patient's husband can take care of her on  his own as they are out alone more than half of the day ? ?History of CVA (cerebral vascular accident) ([place]) ?With residual deficits.  Resume home meds ?  ?Essential hypertension ?Blood pressure transiently soft, likely from dehydration.  Hydrate.  Not on antihypertensives. ?  ?Depression ?Hold home psychoactive medications Adderall, Depakote Xanax for now, with altered mental status given above ? ? ?DVT prophylaxis: Lovenox ?Code Status: Full ?Family Communication: Husband at bedside ? ?Status is: Inpatient ? ?Dispo: The patient is from: Home ?             Anticipated d/c is to: To be determined ?             Anticipated d/c date is: 24 to 48 hours pending clinical course ?             Patient currently not medically stable for discharge ? ?Consultants:  ?None ? ?Procedures:  ?None ? ?Antimicrobials:  ?None ? ?Subjective: ?No acute issues or events overnight, review of systems still markedly limited given patient's somnolence ? ?Objective: ?Vitals:  ? 01/19/22 0459 01/19/22 1425 01/19/22 2100 01/20/22 0336  ?BP: 139/71 (!) 173/64 (!) 158/82 (!) 172/70  ?Pulse: 73 93 78 76  ?Resp: 19  20 18   ?Temp: (!) 97 ?F (36.1 ?C) 98.2 ?F (36.8 ?C) 97.8 ?F (36.6 ?C) 98.2 ?F (36.8 ?C)  ?TempSrc:  Oral Oral Oral  ?SpO2: 93% 92% 97% 96%  ?Weight:      ?Height:      ? ? ?Intake/Output Summary (Last 24 hours) at 01/20/2022 0713 ?Last data filed at 01/20/2022 0438 ?Gross per 24 hour  ?Intake 414.63 ml  ?Output 400 ml  ?Net 14.63 ml  ? ? ?Filed  Weights  ? 01/18/22 1252 01/18/22 2239  ?Weight: 59 kg 54 kg  ? ? ?Examination: ? ?General exam: Appears calm and comfortable  ?Respiratory system: Clear to auscultation. Respiratory effort normal. ?Cardiovascular system: S1 & S2 heard, RRR. No JVD, murmurs, rubs, gallops or clicks. No pedal edema. ?Gastrointestinal system: Abdomen is nondistended, soft and nontender. No organomegaly or masses felt. Normal bowel sounds heard. ?Central nervous system: Alert and oriented. No focal neurological  deficits. ?Extremities: Symmetric 5 x 5 power. ?Skin: No rashes, lesions or ulcers ?Psychiatry: Judgement and insight appear normal. Mood & affect appropriate.  ? ? ? ?Data Reviewed: I have personally reviewed following labs and imaging studies ? ?CBC: ?Recent Labs  ?Lab 01/18/22 ?1311 01/19/22 ?8413 01/20/22 ?0501  ?WBC 8.6 6.1 5.7  ?HGB 14.3 12.4 13.6  ?HCT 44.3 38.2 42.0  ?MCV 101.1* 101.6* 101.4*  ?PLT 200 179 168  ? ? ?Basic Metabolic Panel: ?Recent Labs  ?Lab 01/18/22 ?1311 01/19/22 ?2440 01/20/22 ?0501  ?NA 137 141 142  ?K 4.4 3.8 3.8  ?CL 102 111 111  ?CO2 26 25 24   ?GLUCOSE 110* 131* 87  ?BUN 28* 21 15  ?CREATININE 0.98 0.76 0.65  ?CALCIUM 8.8* 8.4* 8.9  ? ? ?GFR: ?Estimated Creatinine Clearance: 50.3 mL/min (by C-G formula based on SCr of 0.65 mg/dL). ?Liver Function Tests: ?Recent Labs  ?Lab 01/20/22 ?0501  ?AST 12*  ?ALT 11  ?ALKPHOS 41  ?BILITOT 0.4  ?PROT 6.1*  ?ALBUMIN 2.8*  ? ?No results for input(s): LIPASE, AMYLASE in the last 168 hours. ?Recent Labs  ?Lab 01/18/22 ?1505  ?AMMONIA 13  ? ? ?Coagulation Profile: ?No results for input(s): INR, PROTIME in the last 168 hours. ?Cardiac Enzymes: ?No results for input(s): CKTOTAL, CKMB, CKMBINDEX, TROPONINI in the last 168 hours. ?BNP (last 3 results) ?No results for input(s): PROBNP in the last 8760 hours. ?HbA1C: ?No results for input(s): HGBA1C in the last 72 hours. ?CBG: ?Recent Labs  ?Lab 01/18/22 ?1400 01/18/22 ?2135  ?GLUCAP 99 139*  ? ? ?Lipid Profile: ?No results for input(s): CHOL, HDL, LDLCALC, TRIG, CHOLHDL, LDLDIRECT in the last 72 hours. ?Thyroid Function Tests: ?Recent Labs  ?  01/18/22 ?1923  ?TSH 1.348  ? ? ?Anemia Panel: ?No results for input(s): VITAMINB12, FOLATE, FERRITIN, TIBC, IRON, RETICCTPCT in the last 72 hours. ?Sepsis Labs: ?Recent Labs  ?Lab 01/18/22 ?1311 01/18/22 ?1456 01/18/22 ?2211  ?LATICACIDVEN 1.8 2.0* 1.0  ? ? ? ?Recent Results (from the past 240 hour(s))  ?Urine culture     Status: None  ? Collection Time: 01/10/22  12:58 PM  ? Specimen: Urine  ?Result Value Ref Range Status  ? Source: NOT GIVEN  Final  ? Status: FINAL  Final  ? Isolate 1:   Final  ?  Mixed genital flora isolated. These superficial bacteria are not indicative of a urinary tract infection. No further organism identification is warranted on this specimen. If clinically indicated, recollect clean-catch, mid-stream urine and transfer  ?immediately to Urine Culture Transport Tube. ?  ?Culture, blood (Routine X 2) w Reflex to ID Panel     Status: None (Preliminary result)  ? Collection Time: 01/18/22  1:11 PM  ? Specimen: Left Antecubital; Blood  ?Result Value Ref Range Status  ? Specimen Description LEFT ANTECUBITAL  Final  ? Special Requests   Final  ?  BOTTLES DRAWN AEROBIC AND ANAEROBIC Blood Culture adequate volume  ? Culture   Final  ?  NO GROWTH < 12 HOURS ?Performed at [facility]  [facility], [address] [place], [place] [address] ?  ? Report Status PENDING  Incomplete  ?Resp Panel by RT-PCR (Flu A&B, Covid) Nasopharyngeal Swab     Status: None  ? Collection Time: 01/18/22  3:05 PM  ? Specimen: Nasopharyngeal Swab; Nasopharyngeal(NP) swabs in vial transport medium  ?Result Value Ref Range Status  ? SARS Coronavirus 2 by RT PCR NEGATIVE NEGATIVE Final  ?  Comment: (NOTE) ?SARS-CoV-2 target nucleic acids are NOT DETECTED. ? ?The SARS-CoV-2 RNA is generally detectable in upper respiratory ?specimens during the acute phase of infection. The lowest ?concentration of SARS-CoV-2 viral copies this assay can detect is ?138 copies/mL. A negative result does not preclude SARS-Cov-2 ?infection and should not be used as the sole basis for treatment or ?other patient management decisions. A negative result may occur with  ?improper specimen collection/handling, submission of specimen other ?than nasopharyngeal swab, presence of viral mutation(s) within the ?areas targeted by this assay, and inadequate number of viral ?copies(<138 copies/mL). A negative result must be combined  with ?clinical observations, patient history, and epidemiological ?information. The expected result is Negative. ? ?Fact Sheet for Patients:  ?Entrepreneur[employer]ulse[employer]com.au ? ?Fact Sheet for Healthcare

## 2022-01-21 DIAGNOSIS — G9341 Metabolic encephalopathy: Secondary | ICD-10-CM | POA: Diagnosis not present

## 2022-01-21 LAB — COMPREHENSIVE METABOLIC PANEL
ALT: 12 U/L (ref 0–44)
AST: 14 U/L — ABNORMAL LOW (ref 15–41)
Albumin: 2.7 g/dL — ABNORMAL LOW (ref 3.5–5.0)
Alkaline Phosphatase: 40 U/L (ref 38–126)
Anion gap: 8 (ref 5–15)
BUN: 14 mg/dL (ref 8–23)
CO2: 25 mmol/L (ref 22–32)
Calcium: 8.9 mg/dL (ref 8.9–10.3)
Chloride: 106 mmol/L (ref 98–111)
Creatinine, Ser: 0.73 mg/dL (ref 0.44–1.00)
GFR, Estimated: >60 mL/min (ref >60)
Glucose, Bld: 82 mg/dL (ref 70–99)
Potassium: 4.1 mmol/L (ref 3.5–5.1)
Sodium: 139 mmol/L (ref 135–145)
Total Bilirubin: 0.4 mg/dL (ref 0.3–1.2)
Total Protein: 5.8 g/dL — ABNORMAL LOW (ref 6.5–8.1)

## 2022-01-21 LAB — CBC
HCT: 36.7 % (ref 36.0–46.0)
Hemoglobin: 12.1 g/dL (ref 12.0–15.0)
MCH: 33.1 pg (ref 26.0–34.0)
MCHC: 33 g/dL (ref 30.0–36.0)
MCV: 100.3 fL — ABNORMAL HIGH (ref 80.0–100.0)
Platelets: 196 10*3/uL (ref 150–400)
RBC: 3.66 MIL/uL — ABNORMAL LOW (ref 3.87–5.11)
RDW: 14.3 % (ref 11.5–15.5)
WBC: 6.1 10*3/uL (ref 4.0–10.5)
nRBC: 0 % (ref 0.0–0.2)

## 2022-01-21 NOTE — Evaluation (Signed)
Occupational Therapy Evaluation ?Patient Details ?Name: Susan Davidson ?MRN: [id number] ?DOB: 10-08-1950 ?Today's Date: 01/21/2022 ? ? ?History of Present Illness Susan AVENI is a 71 y.o. female with medical history significant for dementia, CKD 3, CVA with residual left-sided hemiparesis, hypertension and depression.  Patient presented to the ED for altered mental status, appears to be intermittently somnolent per husband at bedside worsening over the past 2 days prior to admission.  Denies any recent lifestyle dietary or medication changes over the past few weeks to months.  ? ?Clinical Impression ?  ?Pt agreeable to OT evaluation. Pt reports much assist for ADL's and transfers at baseline. This date pt demonstrates poor sitting and standing balance with need for moderate to maximal assist for bed mobility and transfers. Attempted hemi-walker with pt but with lack of safety noted. Graded to RW with mild improvements. Pt needs much assist to pivot to the [facility] at this time. Pt has limited L UE functional use from prior stroke. Pt left in bed with call bell within reach. Pt will benefit from continued OT in the hospital and recommended venue below to increase strength, balance, and endurance for safe ADL's.  ? ? ?   ? ?Recommendations for follow up therapy are one component of a multi-disciplinary discharge planning process, led by the attending physician.  Recommendations may be updated based on patient status, additional functional criteria and insurance authorization.  ? ?Follow Up Recommendations ? Skilled nursing-short term rehab (<3 hours/day)  ?  ?Assistance Recommended at Discharge Frequent or constant Supervision/Assistance  ?Patient can return home with the following A lot of help with walking and/or transfers;A lot of help with bathing/dressing/bathroom;Assistance with cooking/housework;Assistance with feeding;Help with stairs or ramp for entrance;Assist for transportation ? ?  ?Functional Status  Assessment ? Patient has had a recent decline in their functional status and/or demonstrates limited ability to make significant improvements in function in a reasonable and predictable amount of time  ?Equipment Recommendations ? None recommended by OT  ?  ?Recommendations for Other Services   ? ? ?  ?Precautions / Restrictions Precautions ?Precautions: Fall ?Restrictions ?Weight Bearing Restrictions: No  ? ?  ? ?Mobility Bed Mobility ?Overal bed mobility: Needs Assistance ?Bed Mobility: Supine to Sit, Sit to Supine ?  ?  ?Supine to sit: Mod assist, HOB elevated ?Sit to supine: Max assist ?  ?General bed mobility comments: slow labored movement with posterior and L side lean. ?  ? ?Transfers ?Overall transfer level: Needs assistance ?Equipment used: Rolling walker (2 wheels) ?Transfers: Sit to/from Stand, Bed to chair/wheelchair/BSC ?Sit to Stand: Mod assist, Max assist ?Stand pivot transfers: Max assist ?  ?  ?  ?  ?General transfer comment: Pt does not side step well needing much assist for mobility. Graded from step pivot to for stand pivot. ?  ? ?  ?Balance Overall balance assessment: Needs assistance ?Sitting-balance support: Bilateral upper extremity supported, Feet supported ?Sitting balance-Leahy Scale: Poor ?Sitting balance - Comments: seated EOB ?Postural control: Left lateral lean, Posterior lean ?Standing balance support: Bilateral upper extremity supported, During functional activity, Reliant on assistive device for balance ?Standing balance-Leahy Scale: Poor ?Standing balance comment: using RW ?  ?  ?  ?  ?  ?  ?  ?  ?  ?  ?  ?   ? ?ADL either performed or assessed with clinical judgement  ? ?ADL Overall ADL's : Needs assistance/impaired ?Eating/Feeding: Set up;Sitting ?  ?Grooming: Moderate assistance;Sitting ?  ?Upper Body Bathing: Moderate assistance;Sitting ?  ?  Lower Body Bathing: Total assistance;Bed level ?  ?Upper Body Dressing : Moderate assistance;Sitting ?  ?Lower Body Dressing: Bed  level;Total assistance ?  ?Toilet Transfer: Maximal assistance;Stand-pivot;Rolling walker (2 wheels);BSC/3in1 ?Toilet Transfer Details (indicate cue type and reason): EOB to [facility] and back. ?Toileting- Clothing Manipulation and Hygiene: Total assistance;Sit to/from stand;Bed level ?Toileting - Clothing Manipulation Details (indicate cue type and reason): per care completed by NT while pt assisted to stand by OT ?  ?  ?  ?General ADL Comments: Pt weak and limited at baseline prom previous stroke. Pt needs much assist for ADL's at this time.  ? ? ? ?Vision Baseline Vision/History: 1 Wears glasses ?Ability to See in Adequate Light: 1 Impaired ?Patient Visual Report: No change from baseline ?Vision Assessment?: No apparent visual deficits  ?   ?Perception   ?  ?Praxis   ?  ? ?Pertinent Vitals/Pain Pain Assessment ?Pain Assessment: No/denies pain  ? ? ? ?Hand Dominance Right ?  ?Extremity/Trunk Assessment Upper Extremity Assessment ?Upper Extremity Assessment: RUE deficits/detail;LUE deficits/detail ?RUE Deficits / Details: Generally weak. Decreased coordination for sequential finger touching. ?RUE Coordination: decreased fine motor;decreased gross motor ?LUE Deficits / Details: Prior CVA. 2-/5 shoulder flexion; 3/5 elbow flexion and extension; 2-/5 for wrist flexion and extension; 3/5 grip. Pt limtied to ~90* P/ROM for shoulder flexion and abduction. ?LUE Coordination: decreased fine motor;decreased gross motor ?  ?Lower Extremity Assessment ?Lower Extremity Assessment: Defer to PT evaluation ?  ?Cervical / Trunk Assessment ?Cervical / Trunk Assessment: Kyphotic ?  ?Communication Communication ?Communication: No difficulties ?  ?Cognition Arousal/Alertness: Awake/alert ?Behavior During Therapy: [facility] for tasks assessed/performed ?Overall Cognitive Status: Within Functional Limits for tasks assessed ?  ?  ?  ?  ?  ?  ?  ?  ?  ?  ?  ?  ?  ?  ?  ?  ?  ?  ?  ?General Comments    ? ?  ?Exercises   ?  ?Shoulder Instructions     ? ? ?Home Living Family/patient expects to be discharged to:: Private residence ?Living Arrangements: Spouse/significant other ?Available Help at Discharge: Personal care attendant;Family;Available 24 hours/day ?Type of Home: House ?Home Access: Stairs to enter ?Entrance Stairs-Number of Steps: 2 ?  ?Home Layout: Able to live on main level with bedroom/bathroom ?  ?  ?Bathroom Shower/Tub: Tub/shower unit;Walk-in shower;Sponge bathes at baseline ?  ?Bathroom Toilet: Standard ?Bathroom Accessibility: Yes ?How Accessible: Accessible via wheelchair;Accessible via walker ?Home Equipment: BSC/3in1;Wheelchair - manual ?  ?  ?  ? ?  ?Prior Functioning/Environment Prior Level of Function : Needs assist ?  ?  ?  ?Physical Assist : Mobility (physical);ADLs (physical) ?Mobility (physical): Bed mobility;Transfers;Gait;Stairs ?ADLs (physical): Grooming;Bathing;Dressing;Toileting;IADLs ?Mobility Comments: patient reports largely staying hospital bed; no transfers to chair during the day; husband performs transfers to wheelchair and navigates 2 entry stairs with wheelchair; awaiting ramp to be built (taken from PT note) ?ADLs Comments: Pt reports assited by aides a good deal for bathing, dressing, toileting, and grooming. Pt able to feed self. Assisted for IADL's. ?  ? ?  ?  ?OT Problem List: Decreased strength;Decreased range of motion;Decreased activity tolerance;Impaired balance (sitting and/or standing);Decreased coordination;Impaired tone;Impaired UE functional use ?  ?   ?OT Treatment/Interventions: Self-care/ADL training;Therapeutic exercise;Neuromuscular education;DME and/or AE instruction;Manual therapy;Therapeutic activities;Patient/family education;Balance training  ?  ?OT Goals(Current goals can be found in the care plan section) Acute Rehab OT Goals ?Patient Stated Goal: pt open to rehab ?OT Goal Formulation: With patient ?Time For  Goal Achievement: 02/04/22 ?Potential to Achieve Goals: Fair  ?OT Frequency: Min  2X/week ?  ? ?Co-evaluation   ?  ?  ?  ?  ? ?  ?AM-PAC OT "6 Clicks" Daily Activity     ?Outcome Measure Help from another person eating meals?: A Little ?Help from another person taking care of personal grooming?: A Lot ?Help

## 2022-01-21 NOTE — Progress Notes (Signed)
?PROGRESS NOTE ? ? ? ?Susan Davidson  QIH:[id number] DOB: 06-Nov-1950 DOA: 01/18/2022 ?PCP: Biagio Borg, MD ? ? ?Brief Narrative:  ? Susan Davidson is a 71 y.o. female with medical history significant for dementia, CKD 3, CVA with residual left-sided hemiparesis, hypertension and depression.  Patient presented to the ED for altered mental status, appears to be intermittently somnolent per husband at bedside worsening over the past 2 days prior to admission.  Denies any recent lifestyle dietary or medication changes over the past few weeks to months. ? ?Assessment & Plan: ?  ?Principal Problem: ?  Acute metabolic encephalopathy ?Active Problems: ?  Depression ?  Essential hypertension ?  CVA (cerebral vascular accident) [facility]) ? ?Acute metabolic encephalopathy, resolved ?Likely polypharmacy in nature, POA ?Patient on multiple CNS acting medications -we will discontinue alprazolam and Adderall at discharge, patient can continue her Depakote Namenda and Paxil as she appears to be at baseline now on these medications alone ?Patient essentially at baseline per husband at bedside now, continues to be quite weak per PT evaluation will likely need SNF placement in the interim ? ?Acute on chronic ambulatory dysfunction  ?-PT OT following, currently recommending SNF placement given acute on chronic ambulatory dysfunction ?-Patient's husband agreeable to transfer to SNF for ongoing physical therapy needs given limited help at home and profound weakness compared to her baseline even just a few weeks ago. ? ?History of CVA (cerebral vascular accident) ([place]) ?With residual deficits.  Resume home meds ?  ?Essential hypertension ?Currently well controlled ?  ?Depression ?Discontinue Adderall and Xanax as above, otherwise continue Depakote Paxil ? ? ?DVT prophylaxis: Lovenox ?Code Status: Full ?Family Communication: Husband at bedside ? ?Status is: Inpatient ? ?Dispo: The patient is from: Home ?             Anticipated d/c is to:  SNF ?             Anticipated d/c date is: 24 to 48 hours awaiting bed availability and insurance approval ?             Patient currently is medically stable for discharge -currently awaiting bed availability and insurance approval ? ?Consultants:  ?None ? ?Procedures:  ?None ? ?Antimicrobials:  ?None ? ?Subjective: ?No acute issues or events overnight patient denies nausea vomiting diarrhea constipation headache fevers chills or chest pain ? ?Objective: ?Vitals:  ? 01/20/22 0336 01/20/22 1215 01/20/22 2130 01/21/22 0431  ?BP: (!) 172/70 140/64 (!) 155/63 (!) 155/65  ?Pulse: 76 84 75 77  ?Resp: 18 18 19 18   ?Temp: 98.2 ?F (36.8 ?C) 98.1 ?F (36.7 ?C) 98.3 ?F (36.8 ?C) 98.5 ?F (36.9 ?C)  ?TempSrc: Oral Oral    ?SpO2: 96% 95% 97% 95%  ?Weight:      ?Height:      ? ? ?Intake/Output Summary (Last 24 hours) at 01/21/2022 0802 ?Last data filed at 01/21/2022 0500 ?Gross per 24 hour  ?Intake 680 ml  ?Output 1000 ml  ?Net -320 ml  ? ? ?Filed Weights  ? 01/18/22 1252 01/18/22 2239  ?Weight: 59 kg 54 kg  ? ? ?Examination: ? ?General:  Pleasantly resting in bed, No acute distress. ?HEENT:  Normocephalic atraumatic.  Sclerae nonicteric, noninjected.  Extraocular movements intact bilaterally. ?Neck:  Without mass or deformity.  Trachea is midline. ?Lungs:  Clear to auscultate bilaterally without rhonchi, wheeze, or rales. ?Heart:  Regular rate and rhythm.  Without murmurs, rubs, or gallops. ?Abdomen:  Soft, nontender, nondistended.  Without guarding or  rebound. ?Extremities: Without cyanosis, clubbing, edema, or obvious deformity. ?Vascular:  Dorsalis pedis and posterior tibial pulses palpable bilaterally. ?Skin:  Warm and dry, no erythema, no ulcerations. ? ? ?Data Reviewed: I have personally reviewed following labs and imaging studies ? ?CBC: ?Recent Labs  ?Lab 01/18/22 ?1311 01/19/22 ?9563 01/20/22 ?0501 01/21/22 ?0459  ?WBC 8.6 6.1 5.7 6.1  ?HGB 14.3 12.4 13.6 12.1  ?HCT 44.3 38.2 42.0 36.7  ?MCV 101.1* 101.6* 101.4* 100.3*   ?PLT 200 179 168 196  ? ? ?Basic Metabolic Panel: ?Recent Labs  ?Lab 01/18/22 ?1311 01/19/22 ?8756 01/20/22 ?0501 01/21/22 ?0459  ?NA 137 141 142 139  ?K 4.4 3.8 3.8 4.1  ?CL 102 111 111 106  ?CO2 26 25 24 25   ?GLUCOSE 110* 131* 87 82  ?BUN 28* 21 15 14   ?CREATININE 0.98 0.76 0.65 0.73  ?CALCIUM 8.8* 8.4* 8.9 8.9  ? ? ?GFR: ?Estimated Creatinine Clearance: 50.3 mL/min (by C-G formula based on SCr of 0.73 mg/dL). ?Liver Function Tests: ?Recent Labs  ?Lab 01/20/22 ?0501 01/21/22 ?0459  ?AST 12* 14*  ?ALT 11 12  ?ALKPHOS 41 40  ?BILITOT 0.4 0.4  ?PROT 6.1* 5.8*  ?ALBUMIN 2.8* 2.7*  ? ? ?No results for input(s): LIPASE, AMYLASE in the last 168 hours. ?Recent Labs  ?Lab 01/18/22 ?1505  ?AMMONIA 13  ? ? ?Coagulation Profile: ?No results for input(s): INR, PROTIME in the last 168 hours. ?Cardiac Enzymes: ?No results for input(s): CKTOTAL, CKMB, CKMBINDEX, TROPONINI in the last 168 hours. ?BNP (last 3 results) ?No results for input(s): PROBNP in the last 8760 hours. ?HbA1C: ?No results for input(s): HGBA1C in the last 72 hours. ?CBG: ?Recent Labs  ?Lab 01/18/22 ?1400 01/18/22 ?2135  ?GLUCAP 99 139*  ? ? ?Lipid Profile: ?No results for input(s): CHOL, HDL, LDLCALC, TRIG, CHOLHDL, LDLDIRECT in the last 72 hours. ?Thyroid Function Tests: ?Recent Labs  ?  01/18/22 ?1923  ?TSH 1.348  ? ? ?Anemia Panel: ?No results for input(s): VITAMINB12, FOLATE, FERRITIN, TIBC, IRON, RETICCTPCT in the last 72 hours. ?Sepsis Labs: ?Recent Labs  ?Lab 01/18/22 ?1311 01/18/22 ?1456 01/18/22 ?2211  ?LATICACIDVEN 1.8 2.0* 1.0  ? ? ? ?Recent Results (from the past 240 hour(s))  ?Culture, blood (Routine X 2) w Reflex to ID Panel     Status: None (Preliminary result)  ? Collection Time: 01/18/22  1:11 PM  ? Specimen: Left Antecubital; Blood  ?Result Value Ref Range Status  ? Specimen Description LEFT ANTECUBITAL  Final  ? Special Requests   Final  ?  BOTTLES DRAWN AEROBIC AND ANAEROBIC Blood Culture adequate volume  ? Culture   Final  ?  NO GROWTH 3  DAYS ?Performed at [facility], [address] [place], [place] [address] ?  ? Report Status PENDING  Incomplete  ?Resp Panel by RT-PCR (Flu A&B, Covid) Nasopharyngeal Swab     Status: None  ? Collection Time: 01/18/22  3:05 PM  ? Specimen: Nasopharyngeal Swab; Nasopharyngeal(NP) swabs in vial transport medium  ?Result Value Ref Range Status  ? SARS Coronavirus 2 by RT PCR NEGATIVE NEGATIVE Final  ?  Comment: (NOTE) ?SARS-CoV-2 target nucleic acids are NOT DETECTED. ? ?The SARS-CoV-2 RNA is generally detectable in upper respiratory ?specimens during the acute phase of infection. The lowest ?concentration of SARS-CoV-2 viral copies this assay can detect is ?138 copies/mL. A negative result does not preclude SARS-Cov-2 ?infection and should not be used as the sole basis for treatment or ?other patient management decisions. A negative result may  occur with  ?improper specimen collection/handling, submission of specimen other ?than nasopharyngeal swab, presence of viral mutation(s) within the ?areas targeted by this assay, and inadequate number of viral ?copies(<138 copies/mL). A negative result must be combined with ?clinical observations, patient history, and epidemiological ?information. The expected result is Negative. ? ?Fact Sheet for Patients:  ?EntrepreneurPulse.com.au ? ?Fact Sheet for Healthcare Providers:  ?IncredibleEmployment.be ? ?This test is no t yet approved or cleared by the [place] FDA and  ?has been authorized for detection and/or diagnosis of SARS-CoV-2 by ?FDA under an Emergency Use Authorization (EUA). This EUA will remain  ?in effect (meaning this test can be used) for the duration of the ?COVID-19 declaration under Section 564(b)(1) of the Act, 21 ?U.S.C.section 360bbb-3(b)(1), unless the authorization is terminated  ?or revoked sooner.  ? ? ?  ? Influenza A by PCR NEGATIVE NEGATIVE Final  ? Influenza B by PCR NEGATIVE NEGATIVE Final  ?  Comment:  (NOTE) ?The Xpert Xpress SARS-CoV-2/FLU/RSV plus assay is intended as an aid ?in the diagnosis of influenza from Nasopharyngeal swab specimens and ?should not be used as a sole basis for treatment. Nasal washings a

## 2022-01-21 NOTE — TOC Initial Note (Addendum)
Transition of Care (TOC) - Initial/Assessment Note  ? ? ?Patient Details  ?Name: ANASIA AGRO ?MRN: [id number] ?Date of Birth: 1950/12/29 ? ?Transition of Care (TOC) CM/SW Contact:    ?Iona Beard, LCSWA ?Phone Number: ?01/21/2022, 9:27 AM ? ?Clinical Narrative:                 ?CSW met with pt and husband in the room to talk about SNF recommendation. Pt and husband are agreeable to SNF referral sent out. Pt has been to [facility] in the past. CSW to go over bed offers with pt and husband. TOC to follow.  ? ?Addendum: CSW spoke to [place] with [facility] who states that they can make a bed offer on pt pending that pts spouse pays the remainder of her bill with their facility. Jackelyn Poling also states that pts husband will need to sign pts monthly check over to the facility until pt discharges home. CSW explained all of this to pts spouse who is agreeable but asks that facility call to explain further what he needs to do. CSW spoke to [place] who will have billing call pts husband to explain. Jackelyn Poling will start insurance auth for SNF. TOC to follow.  ? ?Expected Discharge Plan: [place] ?Barriers to Discharge: Continued Medical Work up ? ? ?Patient Goals and CMS Choice ?  ?  ?  ? ?Expected Discharge Plan and Services ?Expected Discharge Plan: [place] ?In-house Referral: Clinical Social Work ?  ?  ?Living arrangements for the past 2 months: [place] ?                ?  ?  ?  ?  ?  ?  ?  ?  ?  ?  ? ?Prior Living Arrangements/Services ?Living arrangements for the past 2 months: [place] ?Lives with:: Spouse ?Patient language and need for interpreter reviewed:: Yes ?       ?Need for Family Participation in Patient Care: Yes (Comment) ?Care giver support system in place?: Yes (comment) ?  ?Criminal Activity/Legal Involvement Pertinent to Current Situation/Hospitalization: No - Comment as needed ? ?Activities of Daily Living ?  ?  ? ?Permission Sought/Granted ?  ?  ?   ?   ?   ?    ? ?Emotional Assessment ?  ?  ?  ?Orientation: : Oriented to Self ?Alcohol / Substance Use: Not Applicable ?Psych Involvement: No (comment) ? ?Admission diagnosis:  Somnolence [R40.0] ?Weakness [R53.1] ?Altered mental status, unspecified altered mental status type [R41.82] ?Acute metabolic encephalopathy [S50.53] ?Patient Active Problem List  ? Diagnosis Date Noted  ? Confusion 01/08/2022  ? Bipolar illness ([place]) 01/06/2022  ? Gross hematuria 11/29/2021  ? Bilateral hearing loss 10/29/2021  ? History of stroke 10/29/2021  ? Nail disorder 10/29/2021  ? Unresponsiveness 08/02/2021  ? Acute metabolic encephalopathy 97/67/3419  ? CKD (chronic kidney disease) stage 3, GFR 30-59 ml/min (HCC) 05/04/2021  ? COVID-19 virus infection 05/04/2021  ? Lower urinary tract infectious disease   ? Generalized weakness 01/27/2021  ? Spinal stenosis at L4-L5 level 01/27/2021  ? CVA (cerebral vascular accident) ([place]) 01/27/2021  ? Ambulatory dysfunction 01/26/2021  ? Tobacco use disorder 01/26/2021  ? CKD (chronic kidney disease) 11/15/2020  ? B12 deficiency 11/09/2020  ? Urinary frequency 11/02/2018  ? Daytime somnolence 10/19/2012  ? Impaired glucose tolerance 08/27/2011  ? Encounter for well adult exam with abnormal findings 08/27/2011  ? Vitamin D deficiency 04/14/2010  ? HYPERSOMNIA 02/19/2009  ?  Hypothyroidism 02/12/2008  ? HLD (hyperlipidemia) 02/12/2008  ? ANXIETY 02/12/2008  ? Depression 02/12/2008  ? Essential hypertension 02/12/2008  ? OSTEOPENIA 02/12/2008  ? ?PCP:  Biagio Borg, MD ?Pharmacy:   ?[facility] [place] [facility] [address] [place] ?[place] ?[place] [address] ?Phone: [phone] Fax: [phone] ? ? ? ? ?Social Determinants of Health (SDOH) Interventions ?  ? ?Readmission Risk Interventions ? ?  01/29/2021  ? 12:39 PM  ?Readmission Risk Prevention Plan  ?Medication Screening Complete  ?Transportation Screening Complete  ? ? ? ?

## 2022-01-21 NOTE — [place] FL2 (Signed)
?[place] MEDICAID FL2 LEVEL OF CARE SCREENING TOOL  ?  ? ?IDENTIFICATION  ?Patient Name: ?Susan Davidson Birthdate: Apr 19, 1951 Sex: female Admission Date (Current Location): ?01/18/2022  ?[place] and [place] Number: ? [place] and Address:  ?[facility] [address], [place] ?     Provider Number: ?[id number]  ?Attending Physician Name and Address:  ?Little Ishikawa, MD ? Relative Name and Phone Number:  ?  ?   ?Current Level of Care: ?Hospital Recommended Level of Care: ?[place] Prior Approval Number: ?  ? ?Date Approved/Denied: ?  PASRR Number: ?[id number] A ? ?Discharge Plan: ?SNF ?  ? ?Current Diagnoses: ?Patient Active Problem List  ? Diagnosis Date Noted  ? Confusion 01/08/2022  ? Bipolar illness ([place]) 01/06/2022  ? Gross hematuria 11/29/2021  ? Bilateral hearing loss 10/29/2021  ? History of stroke 10/29/2021  ? Nail disorder 10/29/2021  ? Unresponsiveness 08/02/2021  ? Acute metabolic encephalopathy 88/50/2774  ? CKD (chronic kidney disease) stage 3, GFR 30-59 ml/min (HCC) 05/04/2021  ? COVID-19 virus infection 05/04/2021  ? Lower urinary tract infectious disease   ? Generalized weakness 01/27/2021  ? Spinal stenosis at L4-L5 level 01/27/2021  ? CVA (cerebral vascular accident) ([place]) 01/27/2021  ? Ambulatory dysfunction 01/26/2021  ? Tobacco use disorder 01/26/2021  ? CKD (chronic kidney disease) 11/15/2020  ? B12 deficiency 11/09/2020  ? Urinary frequency 11/02/2018  ? Daytime somnolence 10/19/2012  ? Impaired glucose tolerance 08/27/2011  ? Encounter for well adult exam with abnormal findings 08/27/2011  ? Vitamin D deficiency 04/14/2010  ? HYPERSOMNIA 02/19/2009  ? Hypothyroidism 02/12/2008  ? HLD (hyperlipidemia) 02/12/2008  ? ANXIETY 02/12/2008  ? Depression 02/12/2008  ? Essential hypertension 02/12/2008  ? OSTEOPENIA 02/12/2008  ? ? ?Orientation RESPIRATION BLADDER Height & Weight   ?  ?Self, Place ? Normal Incontinent Weight: 119 lb 0.8  oz (54 kg) ?Height:  5\' 2"  (157.5 cm)  ?BEHAVIORAL SYMPTOMS/MOOD NEUROLOGICAL BOWEL NUTRITION STATUS  ?    Incontinent Diet (See D/C Summary)  ?AMBULATORY STATUS COMMUNICATION OF NEEDS Skin   ?Extensive Assist Verbally Normal ?  ?  ?  ?    ?     ?     ? ? ?Personal Care Assistance Level of Assistance  ?Bathing, Feeding, Dressing Bathing Assistance: Limited assistance ?Feeding assistance: Independent ?Dressing Assistance: Limited assistance ?   ? ?Functional Limitations Info  ?Sight, Hearing, Speech Sight Info: Adequate ?Hearing Info: Adequate ?Speech Info: Adequate  ? ? ?SPECIAL CARE FACTORS FREQUENCY  ?PT (By licensed PT), OT (By licensed OT)   ?  ?PT Frequency: 5 times weekly ?OT Frequency: 5 times weekly ?  ?  ?  ?   ? ? ?Contractures Contractures Info: Not present  ? ? ?Additional Factors Info  ?Code Status, Allergies Code Status Info: FULL ?Allergies Info: Other, Epinephrine, Fire Ant (Solenopsis [place]) Allergy Skin Test, Influenza Vac Split Quad ?  ?  ?  ?   ? ?Current Medications (01/21/2022):  This is the current hospital active medication list ?Current Facility-Administered Medications  ?Medication Dose Route Frequency Provider Last Rate Last Admin  ? acetaminophen (TYLENOL) tablet 650 mg  650 mg Oral Q6H PRN Emokpae, Ejiroghene E, MD   650 mg at 01/20/22 1130  ? Or  ? acetaminophen (TYLENOL) suppository 650 mg  650 mg Rectal Q6H PRN Emokpae, Ejiroghene E, MD      ? aspirin EC tablet 81 mg  81 mg Oral Q breakfast Holli Humbles  C, MD   81 mg at 01/21/22 0859  ? clopidogrel (PLAVIX) tablet 75 mg  75 mg Oral Daily Little Ishikawa, MD   75 mg at 01/21/22 5170  ? divalproex (DEPAKOTE) DR tablet 250 mg  250 mg Oral Daily Madueme, Elvira C, RPH   250 mg at 01/21/22 0901  ? And  ? divalproex (DEPAKOTE) DR tablet 500 mg  500 mg Oral QHS Madueme, Elvira C, RPH   500 mg at 01/20/22 2209  ? enoxaparin (LOVENOX) injection 40 mg  40 mg Subcutaneous QHS Emokpae, Ejiroghene E, MD   40 mg at 01/20/22 2210  ?  levothyroxine (SYNTHROID) tablet 88 mcg  88 mcg Oral Daily Little Ishikawa, MD   88 mcg at 01/21/22 0174  ? memantine (NAMENDA) tablet 5 mg  5 mg Oral BID Little Ishikawa, MD   5 mg at 01/21/22 0900  ? ondansetron (ZOFRAN) tablet 4 mg  4 mg Oral Q6H PRN Emokpae, Ejiroghene E, MD      ? Or  ? ondansetron (ZOFRAN) injection 4 mg  4 mg Intravenous Q6H PRN Emokpae, Ejiroghene E, MD   4 mg at 01/20/22 2033  ? PARoxetine (PAXIL) tablet 40 mg  40 mg Oral q morning Little Ishikawa, MD   40 mg at 01/20/22 1507  ? polyethylene glycol (MIRALAX / GLYCOLAX) packet 17 g  17 g Oral Daily PRN Emokpae, Ejiroghene E, MD      ? ? ? ?Discharge Medications: ?Please see discharge summary for a list of discharge medications. ? ?Relevant Imaging Results: ? ?Relevant Lab Results: ? ? ?Additional Information ?SSN: [id number] ? ?Iona Beard, LCSWA ? ? ? ? ?

## 2022-01-21 NOTE — Plan of Care (Signed)
?  Problem: Acute Rehab OT Goals (only OT should resolve) ?Goal: Pt. Will Perform Grooming ?Flowsheets (Taken 01/21/2022 1057) ?Pt Will Perform Grooming: ? sitting ? with min assist ?Goal: Pt. Will Perform Upper Body Dressing ?Flowsheets (Taken 01/21/2022 1057) ?Pt Will Perform Upper Body Dressing: ? with min guard assist ? sitting ?Goal: Pt. Will Perform Lower Body Dressing ?Flowsheets (Taken 01/21/2022 1057) ?Pt Will Perform Lower Body Dressing: ? with mod assist ? bed level ? with adaptive equipment ?Goal: Pt. Will Transfer To Toilet ?Flowsheets (Taken 01/21/2022 1057) ?Pt Will Transfer to Toilet: ? with mod assist ? squat pivot transfer ? bedside commode ?Goal: Pt/Caregiver Will Perform Home Exercise Program ?Flowsheets (Taken 01/21/2022 1057) ?Pt/caregiver will Perform Home Exercise Program: ? Increased ROM ? Left upper extremity ? Increased strength ? Right Upper extremity ? With minimal assist ? [name] OT, MOT ? ?

## 2022-01-22 DIAGNOSIS — G9341 Metabolic encephalopathy: Secondary | ICD-10-CM | POA: Diagnosis not present

## 2022-01-22 NOTE — Progress Notes (Signed)
?PROGRESS NOTE ? ? ? ?Susan Davidson  DVV:[id number] DOB: 1950-10-17 DOA: 01/18/2022 ?PCP: Biagio Borg, MD ? ? ?Brief Narrative:  ? Susan Davidson is a 71 y.o. female with medical history significant for dementia, CKD 3, CVA with residual left-sided hemiparesis, hypertension and depression.  Patient presented to the ED for altered mental status, appears to be intermittently somnolent per husband at bedside worsening over the past 2 days prior to admission.  Denies any recent lifestyle dietary or medication changes over the past few weeks to months. ? ?Assessment & Plan: ?  ?Principal Problem: ?  Acute metabolic encephalopathy ?Active Problems: ?  Depression ?  Essential hypertension ?  CVA (cerebral vascular accident) [facility]) ? ?Acute metabolic encephalopathy, resolved ?Likely polypharmacy in nature, POA ?Patient on multiple CNS acting medications -we will discontinue alprazolam and Adderall at discharge, patient can continue her Depakote Namenda and Paxil as she appears to be at baseline now on these medications alone ?Patient essentially at baseline per husband at bedside now, continues to be quite weak per PT evaluation will likely need SNF placement in the interim ? ?Acute on chronic ambulatory dysfunction  ?-PT OT following, currently recommending SNF placement given acute on chronic ambulatory dysfunction ?-Patient's husband agreeable to transfer to SNF for ongoing physical therapy needs given limited help at home and profound weakness compared to her baseline even just a few weeks ago. ? ?History of CVA (cerebral vascular accident) ([place]) ?With residual deficits.  Resume home meds ?  ?Essential hypertension ?Currently well controlled ?  ?Depression ?Discontinue Adderall and Xanax as above, otherwise continue Depakote Paxil ? ? ?DVT prophylaxis: Lovenox ?Code Status: Full ?Family Communication: Husband at bedside ? ?Status is: Inpatient ? ?Dispo: The patient is from: Home ?             Anticipated d/c is to:  SNF ?             Anticipated d/c date is: 24 to 48 hours awaiting bed availability and insurance approval ?             Patient currently is medically stable for discharge -currently awaiting bed availability and insurance approval ? ?Consultants:  ?None ? ?Procedures:  ?None ? ?Antimicrobials:  ?None ? ?Subjective: ?No acute issues or events overnight patient denies nausea vomiting diarrhea constipation headache fevers chills or chest pain ? ?Objective: ?Vitals:  ? 01/21/22 1211 01/21/22 2218 01/22/22 0416 01/22/22 1320  ?BP: 129/64  132/69 118/63  ?Pulse: 85 78 80 79  ?Resp: 18 20  16   ?Temp: 98.1 ?F (36.7 ?C) 98.6 ?F (37 ?C) 98.4 ?F (36.9 ?C) 98.2 ?F (36.8 ?C)  ?TempSrc: Oral Oral Oral Oral  ?SpO2: 92% 99% 92% 94%  ?Weight:      ?Height:      ? ? ?Intake/Output Summary (Last 24 hours) at 01/22/2022 1345 ?Last data filed at 01/22/2022 1000 ?Gross per 24 hour  ?Intake 0 ml  ?Output 475 ml  ?Net -475 ml  ? ? ?Filed Weights  ? 01/18/22 1252 01/18/22 2239  ?Weight: 59 kg 54 kg  ? ? ?Examination: ? ?General:  Pleasantly resting in bed, No acute distress. ?HEENT:  Normocephalic atraumatic.  Sclerae nonicteric, noninjected.  Extraocular movements intact bilaterally. ?Neck:  Without mass or deformity.  Trachea is midline. ?Lungs:  Clear to auscultate bilaterally without rhonchi, wheeze, or rales. ?Heart:  Regular rate and rhythm.  Without murmurs, rubs, or gallops. ?Abdomen:  Soft, nontender, nondistended.  Without guarding or rebound. ?Extremities: Without  cyanosis, clubbing, edema, or obvious deformity. ?Vascular:  Dorsalis pedis and posterior tibial pulses palpable bilaterally. ?Skin:  Warm and dry, no erythema, no ulcerations. ? ? ?Data Reviewed: I have personally reviewed following labs and imaging studies ? ?CBC: ?Recent Labs  ?Lab 01/18/22 ?1311 01/19/22 ?6010 01/20/22 ?0501 01/21/22 ?0459  ?WBC 8.6 6.1 5.7 6.1  ?HGB 14.3 12.4 13.6 12.1  ?HCT 44.3 38.2 42.0 36.7  ?MCV 101.1* 101.6* 101.4* 100.3*  ?PLT 200 179 168  196  ? ? ?Basic Metabolic Panel: ?Recent Labs  ?Lab 01/18/22 ?1311 01/19/22 ?9323 01/20/22 ?0501 01/21/22 ?0459  ?NA 137 141 142 139  ?K 4.4 3.8 3.8 4.1  ?CL 102 111 111 106  ?CO2 26 25 24 25   ?GLUCOSE 110* 131* 87 82  ?BUN 28* 21 15 14   ?CREATININE 0.98 0.76 0.65 0.73  ?CALCIUM 8.8* 8.4* 8.9 8.9  ? ? ?GFR: ?Estimated Creatinine Clearance: 50.3 mL/min (by C-G formula based on SCr of 0.73 mg/dL). ?Liver Function Tests: ?Recent Labs  ?Lab 01/20/22 ?0501 01/21/22 ?0459  ?AST 12* 14*  ?ALT 11 12  ?ALKPHOS 41 40  ?BILITOT 0.4 0.4  ?PROT 6.1* 5.8*  ?ALBUMIN 2.8* 2.7*  ? ? ?No results for input(s): LIPASE, AMYLASE in the last 168 hours. ?Recent Labs  ?Lab 01/18/22 ?1505  ?AMMONIA 13  ? ? ?Coagulation Profile: ?No results for input(s): INR, PROTIME in the last 168 hours. ?Cardiac Enzymes: ?No results for input(s): CKTOTAL, CKMB, CKMBINDEX, TROPONINI in the last 168 hours. ?BNP (last 3 results) ?No results for input(s): PROBNP in the last 8760 hours. ?HbA1C: ?No results for input(s): HGBA1C in the last 72 hours. ?CBG: ?Recent Labs  ?Lab 01/18/22 ?1400 01/18/22 ?2135  ?GLUCAP 99 139*  ? ? ?Lipid Profile: ?No results for input(s): CHOL, HDL, LDLCALC, TRIG, CHOLHDL, LDLDIRECT in the last 72 hours. ?Thyroid Function Tests: ?No results for input(s): TSH, T4TOTAL, FREET4, T3FREE, THYROIDAB in the last 72 hours. ? ?Anemia Panel: ?No results for input(s): VITAMINB12, FOLATE, FERRITIN, TIBC, IRON, RETICCTPCT in the last 72 hours. ?Sepsis Labs: ?Recent Labs  ?Lab 01/18/22 ?1311 01/18/22 ?1456 01/18/22 ?2211  ?LATICACIDVEN 1.8 2.0* 1.0  ? ? ? ?Recent Results (from the past 240 hour(s))  ?Culture, blood (Routine X 2) w Reflex to ID Panel     Status: None (Preliminary result)  ? Collection Time: 01/18/22  1:11 PM  ? Specimen: Left Antecubital; Blood  ?Result Value Ref Range Status  ? Specimen Description LEFT ANTECUBITAL  Final  ? Special Requests   Final  ?  BOTTLES DRAWN AEROBIC AND ANAEROBIC Blood Culture adequate volume  ? Culture    Final  ?  NO GROWTH 4 DAYS ?Performed at [facility], [address], [place], [place] [address] ?  ? Report Status PENDING  Incomplete  ?Resp Panel by RT-PCR (Flu A&B, Covid) Nasopharyngeal Swab     Status: None  ? Collection Time: 01/18/22  3:05 PM  ? Specimen: Nasopharyngeal Swab; Nasopharyngeal(NP) swabs in vial transport medium  ?Result Value Ref Range Status  ? SARS Coronavirus 2 by RT PCR NEGATIVE NEGATIVE Final  ?  Comment: (NOTE) ?SARS-CoV-2 target nucleic acids are NOT DETECTED. ? ?The SARS-CoV-2 RNA is generally detectable in upper respiratory ?specimens during the acute phase of infection. The lowest ?concentration of SARS-CoV-2 viral copies this assay can detect is ?138 copies/mL. A negative result does not preclude SARS-Cov-2 ?infection and should not be used as the sole basis for treatment or ?other patient management decisions. A negative result may occur  with  ?improper specimen collection/handling, submission of specimen other ?than nasopharyngeal swab, presence of viral mutation(s) within the ?areas targeted by this assay, and inadequate number of viral ?copies(<138 copies/mL). A negative result must be combined with ?clinical observations, patient history, and epidemiological ?information. The expected result is Negative. ? ?Fact Sheet for Patients:  ?EntrepreneurPulse.com.au ? ?Fact Sheet for Healthcare Providers:  ?IncredibleEmployment.be ? ?This test is no t yet approved or cleared by the [place] FDA and  ?has been authorized for detection and/or diagnosis of SARS-CoV-2 by ?FDA under an Emergency Use Authorization (EUA). This EUA will remain  ?in effect (meaning this test can be used) for the duration of the ?COVID-19 declaration under Section 564(b)(1) of the Act, 21 ?U.S.C.section 360bbb-3(b)(1), unless the authorization is terminated  ?or revoked sooner.  ? ? ?  ? Influenza A by PCR NEGATIVE NEGATIVE Final  ? Influenza B by PCR NEGATIVE NEGATIVE  Final  ?  Comment: (NOTE) ?The Xpert Xpress SARS-CoV-2/FLU/RSV plus assay is intended as an aid ?in the diagnosis of influenza from Nasopharyngeal swab specimens and ?should not be used as a sole basis for tre

## 2022-01-23 DIAGNOSIS — G9341 Metabolic encephalopathy: Secondary | ICD-10-CM | POA: Diagnosis not present

## 2022-01-23 LAB — CULTURE, BLOOD (ROUTINE X 2)
Culture: NO GROWTH
Culture: NO GROWTH
Special Requests: ADEQUATE
Special Requests: ADEQUATE

## 2022-01-23 MED ORDER — ALUM & MAG HYDROXIDE-SIMETH 200-200-20 MG/5ML PO SUSP
30.0000 mL | ORAL | Status: DC | PRN
Start: 2022-01-23 — End: 2022-01-25
  Administered 2022-01-23: 30 mL via ORAL
  Filled 2022-01-23: qty 30

## 2022-01-23 NOTE — Progress Notes (Signed)
?PROGRESS NOTE ? ? ? ?Susan Davidson  QJF:[id number] DOB: [birth year] DOA: 01/18/2022 ?PCP: Biagio Borg, MD ? ? ?Brief Narrative:  ? Susan Davidson is a 71 y.o. female with medical history significant for dementia, CKD 3, CVA with residual left-sided hemiparesis, hypertension and depression.  Patient presented to the ED for altered mental status, appears to be intermittently somnolent per husband at bedside worsening over the past 2 days prior to admission.  Denies any recent lifestyle dietary or medication changes over the past few weeks to months. ? ?Assessment & Plan: ?  ?Principal Problem: ?  Acute metabolic encephalopathy ?Active Problems: ?  Depression ?  Essential hypertension ?  CVA (cerebral vascular accident) [facility]) ? ?Acute metabolic encephalopathy, resolved ?Likely polypharmacy in nature, POA ?Patient on multiple CNS acting medications - discontinued alprazolam and Adderall at admission - no plan to resume at discharge, patient can continue her Depakote Namenda and Paxil as she appears to be at baseline now on these medications alone ?Patient essentially at baseline per husband at bedside now, continues to be quite weak per PT evaluation will likely need SNF placement in the interim ? ?Acute on chronic ambulatory dysfunction  ?-PT OT following, currently recommending SNF placement given acute on chronic ambulatory dysfunction ?-Patient's husband agreeable to transfer to SNF for ongoing physical therapy needs given limited help at home and profound weakness compared to her baseline even just a few weeks ago. ? ?History of CVA (cerebral vascular accident) ([place]) ?- With residual deficits.  Resume home meds ?  ?Essential hypertension ?- Currently well controlled ?  ?Depression ?- Discontinue Adderall and Xanax as above, otherwise continue Depakote Paxil ? ?DVT prophylaxis: Lovenox ?Code Status: Full ?Family Communication: Husband at bedside ? ?Status is: Inpatient ? ?Dispo: The patient is from: Home ?              Anticipated d/c is to: SNF ?             Anticipated d/c date is: 24 to 48 hours awaiting bed availability and insurance approval ?             Patient currently is medically stable for discharge - currently awaiting bed availability and insurance approval ? ?Consultants:  ?None ? ?Procedures:  ?None ? ?Antimicrobials:  ?None ? ?Subjective: ?No acute issues or events overnight patient denies nausea vomiting diarrhea constipation headache fevers chills or chest pain ? ?Objective: ?Vitals:  ? 01/21/22 2218 01/22/22 0416 01/22/22 1320 01/23/22 0505  ?BP:  132/69 118/63 (!) 141/68  ?Pulse: 78 80 79 82  ?Resp: 20  16 16   ?Temp: 98.6 ?F (37 ?C) 98.4 ?F (36.9 ?C) 98.2 ?F (36.8 ?C) 98.5 ?F (36.9 ?C)  ?TempSrc: Oral Oral Oral Oral  ?SpO2: 99% 92% 94% 94%  ?Weight:      ?Height:      ? ? ?Intake/Output Summary (Last 24 hours) at 01/23/2022 0719 ?Last data filed at 01/22/2022 1738 ?Gross per 24 hour  ?Intake 160 ml  ?Output 250 ml  ?Net -90 ml  ? ? ?Filed Weights  ? 01/18/22 1252 01/18/22 2239  ?Weight: 59 kg 54 kg  ? ? ?Examination: ? ?General:  Pleasantly resting in bed, No acute distress. ?HEENT:  Normocephalic atraumatic.  Sclerae nonicteric, noninjected.  Extraocular movements intact bilaterally. ?Neck:  Without mass or deformity.  Trachea is midline. ?Lungs:  Clear to auscultate bilaterally without rhonchi, wheeze, or rales. ?Heart:  Regular rate and rhythm.  Without murmurs, rubs, or gallops. ?Abdomen:  Soft, nontender, nondistended.  Without guarding or rebound. ?Extremities: Without cyanosis, clubbing, edema, or obvious deformity. ?Vascular:  Dorsalis pedis and posterior tibial pulses palpable bilaterally. ?Skin:  Warm and dry, no erythema, no ulcerations. ? ? ?Data Reviewed: I have personally reviewed following labs and imaging studies ? ?CBC: ?Recent Labs  ?Lab 01/18/22 ?1311 01/19/22 ?1478 01/20/22 ?0501 01/21/22 ?0459  ?WBC 8.6 6.1 5.7 6.1  ?HGB 14.3 12.4 13.6 12.1  ?HCT 44.3 38.2 42.0 36.7  ?MCV 101.1* 101.6*  101.4* 100.3*  ?PLT 200 179 168 196  ? ? ?Basic Metabolic Panel: ?Recent Labs  ?Lab 01/18/22 ?1311 01/19/22 ?2956 01/20/22 ?0501 01/21/22 ?0459  ?NA 137 141 142 139  ?K 4.4 3.8 3.8 4.1  ?CL 102 111 111 106  ?CO2 26 25 24 25   ?GLUCOSE 110* 131* 87 82  ?BUN 28* 21 15 14   ?CREATININE 0.98 0.76 0.65 0.73  ?CALCIUM 8.8* 8.4* 8.9 8.9  ? ? ?GFR: ?Estimated Creatinine Clearance: 50.3 mL/min (by C-G formula based on SCr of 0.73 mg/dL). ?Liver Function Tests: ?Recent Labs  ?Lab 01/20/22 ?0501 01/21/22 ?0459  ?AST 12* 14*  ?ALT 11 12  ?ALKPHOS 41 40  ?BILITOT 0.4 0.4  ?PROT 6.1* 5.8*  ?ALBUMIN 2.8* 2.7*  ? ? ?No results for input(s): LIPASE, AMYLASE in the last 168 hours. ?Recent Labs  ?Lab 01/18/22 ?1505  ?AMMONIA 13  ? ? ?Coagulation Profile: ?No results for input(s): INR, PROTIME in the last 168 hours. ?Cardiac Enzymes: ?No results for input(s): CKTOTAL, CKMB, CKMBINDEX, TROPONINI in the last 168 hours. ?BNP (last 3 results) ?No results for input(s): PROBNP in the last 8760 hours. ?HbA1C: ?No results for input(s): HGBA1C in the last 72 hours. ?CBG: ?Recent Labs  ?Lab 01/18/22 ?1400 01/18/22 ?2135  ?GLUCAP 99 139*  ? ? ?Lipid Profile: ?No results for input(s): CHOL, HDL, LDLCALC, TRIG, CHOLHDL, LDLDIRECT in the last 72 hours. ?Thyroid Function Tests: ?No results for input(s): TSH, T4TOTAL, FREET4, T3FREE, THYROIDAB in the last 72 hours. ? ?Anemia Panel: ?No results for input(s): VITAMINB12, FOLATE, FERRITIN, TIBC, IRON, RETICCTPCT in the last 72 hours. ?Sepsis Labs: ?Recent Labs  ?Lab 01/18/22 ?1311 01/18/22 ?1456 01/18/22 ?2211  ?LATICACIDVEN 1.8 2.0* 1.0  ? ? ? ?Recent Results (from the past 240 hour(s))  ?Culture, blood (Routine X 2) w Reflex to ID Panel     Status: None  ? Collection Time: 01/18/22  1:11 PM  ? Specimen: Left Antecubital; Blood  ?Result Value Ref Range Status  ? Specimen Description LEFT ANTECUBITAL  Final  ? Special Requests   Final  ?  BOTTLES DRAWN AEROBIC AND ANAEROBIC Blood Culture adequate volume  ?  Culture   Final  ?  NO GROWTH 5 DAYS ?Performed at [facility], [address] [place], [place] [address] ?  ? Report Status 01/23/2022 FINAL  Final  ?Resp Panel by RT-PCR (Flu A&B, Covid) Nasopharyngeal Swab     Status: None  ? Collection Time: 01/18/22  3:05 PM  ? Specimen: Nasopharyngeal Swab; Nasopharyngeal(NP) swabs in vial transport medium  ?Result Value Ref Range Status  ? SARS Coronavirus 2 by RT PCR NEGATIVE NEGATIVE Final  ?  Comment: (NOTE) ?SARS-CoV-2 target nucleic acids are NOT DETECTED. ? ?The SARS-CoV-2 RNA is generally detectable in upper respiratory ?specimens during the acute phase of infection. The lowest ?concentration of SARS-CoV-2 viral copies this assay can detect is ?138 copies/mL. A negative result does not preclude SARS-Cov-2 ?infection and should not be used as the sole basis for treatment or ?  other patient management decisions. A negative result may occur with  ?improper specimen collection/handling, submission of specimen other ?than nasopharyngeal swab, presence of viral mutation(s) within the ?areas targeted by this assay, and inadequate number of viral ?copies(<138 copies/mL). A negative result must be combined with ?clinical observations, patient history, and epidemiological ?information. The expected result is Negative. ? ?Fact Sheet for Patients:  ?EntrepreneurPulse.com.au ? ?Fact Sheet for Healthcare Providers:  ?IncredibleEmployment.be ? ?This test is no t yet approved or cleared by the [place] FDA and  ?has been authorized for detection and/or diagnosis of SARS-CoV-2 by ?FDA under an Emergency Use Authorization (EUA). This EUA will remain  ?in effect (meaning this test can be used) for the duration of the ?COVID-19 declaration under Section 564(b)(1) of the Act, 21 ?U.S.C.section 360bbb-3(b)(1), unless the authorization is terminated  ?or revoked sooner.  ? ? ?  ? Influenza A by PCR NEGATIVE NEGATIVE Final  ? Influenza B by PCR NEGATIVE  NEGATIVE Final  ?  Comment: (NOTE) ?The Xpert Xpress SARS-CoV-2/FLU/RSV plus assay is intended as an aid ?in the diagnosis of influenza from Nasopharyngeal swab specimens and ?should not be used as

## 2022-01-24 DIAGNOSIS — G9341 Metabolic encephalopathy: Secondary | ICD-10-CM | POA: Diagnosis not present

## 2022-01-24 NOTE — Progress Notes (Signed)
Physical Therapy Treatment ?Patient Details ?Name: Susan Davidson ?MRN: [id number] ?DOB: 1950/11/19 ?Today's Date: 01/24/2022 ? ? ?History of Present Illness Susan Davidson is a 71 y.o. female with medical history significant for dementia, CKD 3, CVA with residual left-sided hemiparesis, hypertension and depression.  Patient presented to the ED for altered mental status, appears to be intermittently somnolent per husband at bedside worsening over the past 2 days prior to admission.  Denies any recent lifestyle dietary or medication changes over the past few weeks to months. ? ?  ?PT Comments  ? ? Patient agreeable to participating in PT today. Patient reporting she needs to go to the bathroom. Patient requiring mod to max assist, multimodal cuing, and extra processing time for instruction for bed mobility and transfers to and from [facility]. Trialed quad cane today. Patient required cues on placement of right and and sequencing of steps. Patient unable to [facility] either foot for side stepping.  Patient assisted back to bed at end of session. MD present. Nursing present at end of session. Patient would continue to benefit from skilled physical therapy in current environment and next venue to continue return to prior function and increase strength, endurance, balance, coordination, and functional mobility and gait skills.  ?   ?Recommendations for follow up therapy are one component of a multi-disciplinary discharge planning process, led by the attending physician.  Recommendations may be updated based on patient status, additional functional criteria and insurance authorization. ? ?Follow Up Recommendations ? Skilled nursing-short term rehab (<3 hours/day) ?  ?  ?Assistance Recommended at Discharge Frequent or constant Supervision/Assistance  ?Patient can return home with the following A lot of help with walking and/or transfers;A lot of help with bathing/dressing/bathroom;Assistance with feeding;Assist for  transportation;Direct supervision/assist for medications management;Help with stairs or ramp for entrance;Direct supervision/assist for financial management;Assistance with cooking/housework ?  ?Equipment Recommendations ?  (hemi-walker)  ?  ?Recommendations for Other Services   ? ? ?  ?Precautions / Restrictions Precautions ?Precautions: Fall ?Restrictions ?Weight Bearing Restrictions: No  ?  ? ?Mobility ? Bed Mobility ?Overal bed mobility: Needs Assistance ?Bed Mobility: Supine to Sit, Sit to Supine ?  ?  ?Supine to sit: Mod assist, HOB elevated ?Sit to supine: Max assist, Mod assist ?  ?General bed mobility comments: slow labored movement with posterior and L side lean. ?  ? ?Transfers ?Overall transfer level: Needs assistance ?Equipment used: Rolling walker (2 wheels) ?Transfers: Sit to/from Stand, Bed to chair/wheelchair/BSC ?Sit to Stand: Mod assist, Max assist ?Stand pivot transfers: Max assist ?Step pivot transfers: Max assist ?  ?  ?  ?General transfer comment: Difficulty with side step requiring much assist for mobility. Graded from step pivot to for stand pivot. ?  ? ?Ambulation/Gait ?  ?  ?General Gait Details: unable ? ? ?Stairs ?  ? ? ?Wheelchair Mobility ?  ? ?Modified Rankin (Stroke Patients Only) ?  ? ? ?  ?Balance Overall balance assessment: Needs assistance ?Sitting-balance support: Bilateral upper extremity supported, Feet supported ?Sitting balance-Leahy Scale: Poor ?Sitting balance - Comments: seated EOB ?Postural control: Left lateral lean, Posterior lean ?Standing balance support: Bilateral upper extremity supported, During functional activity, Reliant on assistive device for balance ?Standing balance-Leahy Scale: Poor ?Standing balance comment: using quad cane ?  ?  ? ?  ?Cognition Arousal/Alertness: Awake/alert ?Behavior During Therapy: [facility] for tasks assessed/performed ?Overall Cognitive Status: Within Functional Limits for tasks assessed; impaired, different from baseline ?  ?  ?  ?  ? ?   ?  Exercises   ? ?  ?General Comments   ?  ?  ? ?Pertinent Vitals/Pain Pain Assessment ?Pain Assessment: No/denies pain  ? ? ?Home Living Family/patient expects to be discharged to:: Skilled nursing facility ?Living Arrangements: Spouse/significant other ?  ?  ?  ?Prior Function    ?   ? ?PT Goals (current goals can now be found in the care plan section) Acute Rehab PT Goals ?Patient Stated Goal: Return home with husband and personal care assistant care. ?PT Goal Formulation: With patient ?Time For Goal Achievement: 02/03/22 ?Potential to Achieve Goals: Fair ?Progress towards PT goals: Progressing toward goals ? ?  ?Frequency ? ? ? Min 3X/week ? ? ? ?  ?PT Plan Current plan remains appropriate  ? ? ?   ?AM-PAC PT "6 Clicks" Mobility   ?Outcome Measure ? Help needed turning from your back to your side while in a flat bed without using bedrails?: A Little ?Help needed moving from lying on your back to sitting on the side of a flat bed without using bedrails?: A Lot ?Help needed moving to and from a bed to a chair (including a wheelchair)?: Total ?Help needed standing up from a chair using your arms (e.g., wheelchair or bedside chair)?: A Lot ?Help needed to walk in hospital room?: Total ?Help needed climbing 3-5 steps with a railing? : Total ?6 Click Score: 10 ? ?  ?End of Session Equipment Utilized During Treatment: Gait belt ?Activity Tolerance: Patient limited by lethargy;Patient limited by fatigue ?Patient left: in bed;with call bell/phone within reach;with bed alarm set;with nursing/sitter in room ?Nurse Communication: Mobility status;Patient requests pain meds ?PT Visit Diagnosis: Unsteadiness on feet (R26.81);Other abnormalities of gait and mobility (R26.89);Muscle weakness (generalized) (M62.81);Difficulty in walking, not elsewhere classified (R26.2);Other symptoms and signs involving the nervous system (R29.898);Hemiplegia and hemiparesis ?Hemiplegia - Right/Left: Left ?Hemiplegia - dominant/non-dominant:  Non-dominant ?Hemiplegia - caused by: Unspecified ?  ? ? ?Time: 1443-1540 ?PT Time Calculation (min) (ACUTE ONLY): 23 min ? ?Charges:  $Therapeutic Activity: 8-22 mins          ?          ? ?Floria Raveling. Hartnett-Rands, MS, PT ?Per [place] ?[place] #[id number] ? ?Felicitas Sine  Hartnett-Rands ?01/24/2022, 11:14 AM ? ?

## 2022-01-24 NOTE — TOC Progression Note (Addendum)
Transition of Care (TOC) - Progression Note  ? ? ?Patient Details  ?Name: RONIYAH LLORENS ?MRN: [id number] ?Date of Birth: Oct 05, 1950 ? ?Transition of Care (TOC) CM/SW Contact  ?Iona Beard, LCSWA ?Phone Number: ?01/24/2022, 11:25 AM ? ?Clinical Narrative:    ?CSW spoke to [place] with [facility] who states that insurance Josem Kaufmann is still pending at this time. Debbie asked that PT see pt again as insurance auth would need an updated note. CSW updated Jackelyn Poling that new PT note was completed. TOC to follow for updates.  ? ?CSW spoke with pts husband who requested CSW see if there were any other bed offers at this time. CSW spoke to [place] at [facility] who states they can make a bed offer. CSW updated pts husband of this and pts husband is thankful and would like to accept this bed. CSW updated Jackelyn Poling at [facility] who will stop insurance auth. CSW updated Ebony Hail with [facility] who will start auth. TOC to follow.  ? ?Expected Discharge Plan: [place] ?Barriers to Discharge: Continued Medical Work up ? ?Expected Discharge Plan and Services ?Expected Discharge Plan: [place] ?In-house Referral: Clinical Social Work ?  ?  ?Living arrangements for the past 2 months: [place] ?                ?  ?  ?  ?  ?  ?  ?  ?  ?  ?  ? ? ?Social Determinants of Health (SDOH) Interventions ?  ? ?Readmission Risk Interventions ? ?  01/29/2021  ? 12:39 PM  ?Readmission Risk Prevention Plan  ?Medication Screening Complete  ?Transportation Screening Complete  ? ? ?

## 2022-01-24 NOTE — Progress Notes (Signed)
?PROGRESS NOTE ? ? ? ?Susan Davidson  TKW:[id number] DOB: 1950/11/18 DOA: 01/18/2022 ?PCP: Biagio Borg, MD ? ? ?Brief Narrative:  ? Susan Davidson is a 71 y.o. female with medical history significant for dementia, CKD 3, CVA with residual left-sided hemiparesis, hypertension and depression.  Patient presented to the ED for altered mental status, appears to be intermittently somnolent per husband at bedside worsening over the past 2 days prior to admission.  Denies any recent lifestyle dietary or medication changes over the past few weeks to months. ? ?Assessment & Plan: ?  ?Principal Problem: ?  Acute metabolic encephalopathy ?Active Problems: ?  Depression ?  Essential hypertension ?  CVA (cerebral vascular accident) [facility]) ? ?Acute metabolic encephalopathy, resolved ?Likely polypharmacy in nature, POA ?Patient on multiple CNS acting medications - discontinued alprazolam and Adderall at admission - no plan to resume at discharge, patient can continue her Depakote Namenda and Paxil as she appears to be at baseline now on these medications alone ?Patient essentially at baseline per husband at bedside now, continues to be quite weak per PT evaluation will likely need SNF placement in the interim ? ?Acute on chronic ambulatory dysfunction  ?-PT OT following, currently recommending SNF placement given acute on chronic ambulatory dysfunction ?-Patient's husband agreeable to transfer to SNF for ongoing physical therapy needs given limited help at home and profound weakness compared to her baseline even just a few weeks ago. ? ?History of CVA (cerebral vascular accident) ([place]) ?- With residual deficits.  Resume home meds ?  ?Essential hypertension ?- Currently well controlled ?  ?Depression ?- Discontinue Adderall and Xanax as above, otherwise continue Depakote Paxil ? ?DVT prophylaxis: Lovenox ?Code Status: Full ?Family Communication: Husband at bedside ? ?Status is: Inpatient ? ?Dispo: The patient is from: Home ?              Anticipated d/c is to: SNF ?             Anticipated d/c date is: 24 to 48 hours awaiting bed availability and insurance approval ?             Patient currently is medically stable for discharge - currently awaiting bed availability and insurance approval ? ?Consultants:  ?None ? ?Procedures:  ?None ? ?Antimicrobials:  ?None ? ?Subjective: ?No acute issues or events overnight patient denies nausea vomiting diarrhea constipation headache fevers chills or chest pain ? ?Objective: ?Vitals:  ? 01/23/22 1440 01/23/22 2105 01/24/22 0440 01/24/22 1351  ?BP: (!) 154/74 (!) 141/72 (!) 112/53 (!) 135/53  ?Pulse: 91 81 82 76  ?Resp: 18 17 15 17   ?Temp: 98.8 ?F (37.1 ?C) 98.7 ?F (37.1 ?C) 97.9 ?F (36.6 ?C) 98.2 ?F (36.8 ?C)  ?TempSrc: Oral Oral  Oral  ?SpO2: 94% 95% 95% 96%  ?Weight:      ?Height:      ? ? ?Intake/Output Summary (Last 24 hours) at 01/24/2022 1406 ?Last data filed at 01/24/2022 0500 ?Gross per 24 hour  ?Intake 240 ml  ?Output 550 ml  ?Net -310 ml  ? ? ?Filed Weights  ? 01/18/22 1252 01/18/22 2239  ?Weight: 59 kg 54 kg  ? ? ?Examination: ? ?General:  Pleasantly resting in bed, No acute distress. ?HEENT:  Normocephalic atraumatic.  Sclerae nonicteric, noninjected.  Extraocular movements intact bilaterally. ?Neck:  Without mass or deformity.  Trachea is midline. ?Lungs:  Clear to auscultate bilaterally without rhonchi, wheeze, or rales. ?Heart:  Regular rate and rhythm.  Without murmurs, rubs, or  gallops. ?Abdomen:  Soft, nontender, nondistended.  Without guarding or rebound. ?Extremities: Without cyanosis, clubbing, edema, or obvious deformity. ?Vascular:  Dorsalis pedis and posterior tibial pulses palpable bilaterally. ?Skin:  Warm and dry, no erythema, no ulcerations. ? ? ?Data Reviewed: I have personally reviewed following labs and imaging studies ? ?CBC: ?Recent Labs  ?Lab 01/18/22 ?1311 01/19/22 ?4665 01/20/22 ?0501 01/21/22 ?0459  ?WBC 8.6 6.1 5.7 6.1  ?HGB 14.3 12.4 13.6 12.1  ?HCT 44.3 38.2 42.0 36.7   ?MCV 101.1* 101.6* 101.4* 100.3*  ?PLT 200 179 168 196  ? ? ?Basic Metabolic Panel: ?Recent Labs  ?Lab 01/18/22 ?1311 01/19/22 ?9935 01/20/22 ?0501 01/21/22 ?0459  ?NA 137 141 142 139  ?K 4.4 3.8 3.8 4.1  ?CL 102 111 111 106  ?CO2 26 25 24 25   ?GLUCOSE 110* 131* 87 82  ?BUN 28* 21 15 14   ?CREATININE 0.98 0.76 0.65 0.73  ?CALCIUM 8.8* 8.4* 8.9 8.9  ? ? ?GFR: ?Estimated Creatinine Clearance: 50.3 mL/min (by C-G formula based on SCr of 0.73 mg/dL). ?Liver Function Tests: ?Recent Labs  ?Lab 01/20/22 ?0501 01/21/22 ?0459  ?AST 12* 14*  ?ALT 11 12  ?ALKPHOS 41 40  ?BILITOT 0.4 0.4  ?PROT 6.1* 5.8*  ?ALBUMIN 2.8* 2.7*  ? ? ?No results for input(s): LIPASE, AMYLASE in the last 168 hours. ?Recent Labs  ?Lab 01/18/22 ?1505  ?AMMONIA 13  ? ? ?Coagulation Profile: ?No results for input(s): INR, PROTIME in the last 168 hours. ?Cardiac Enzymes: ?No results for input(s): CKTOTAL, CKMB, CKMBINDEX, TROPONINI in the last 168 hours. ?BNP (last 3 results) ?No results for input(s): PROBNP in the last 8760 hours. ?HbA1C: ?No results for input(s): HGBA1C in the last 72 hours. ?CBG: ?Recent Labs  ?Lab 01/18/22 ?1400 01/18/22 ?2135  ?GLUCAP 99 139*  ? ? ?Lipid Profile: ?No results for input(s): CHOL, HDL, LDLCALC, TRIG, CHOLHDL, LDLDIRECT in the last 72 hours. ?Thyroid Function Tests: ?No results for input(s): TSH, T4TOTAL, FREET4, T3FREE, THYROIDAB in the last 72 hours. ? ?Anemia Panel: ?No results for input(s): VITAMINB12, FOLATE, FERRITIN, TIBC, IRON, RETICCTPCT in the last 72 hours. ?Sepsis Labs: ?Recent Labs  ?Lab 01/18/22 ?1311 01/18/22 ?1456 01/18/22 ?2211  ?LATICACIDVEN 1.8 2.0* 1.0  ? ? ? ?Recent Results (from the past 240 hour(s))  ?Culture, blood (Routine X 2) w Reflex to ID Panel     Status: None  ? Collection Time: 01/18/22  1:11 PM  ? Specimen: Blood  ?Result Value Ref Range Status  ? Specimen Description LEFT ANTECUBITAL  Final  ? Special Requests   Final  ?  BOTTLES DRAWN AEROBIC AND ANAEROBIC Blood Culture adequate volume   ? Culture   Final  ?  NO GROWTH 5 DAYS ?Performed at [facility], [address], [place], [place] [address] ?  ? Report Status 01/23/2022 FINAL  Final  ?Resp Panel by RT-PCR (Flu A&B, Covid) Nasopharyngeal Swab     Status: None  ? Collection Time: 01/18/22  3:05 PM  ? Specimen: Nasopharyngeal Swab; Nasopharyngeal(NP) swabs in vial transport medium  ?Result Value Ref Range Status  ? SARS Coronavirus 2 by RT PCR NEGATIVE NEGATIVE Final  ?  Comment: (NOTE) ?SARS-CoV-2 target nucleic acids are NOT DETECTED. ? ?The SARS-CoV-2 RNA is generally detectable in upper respiratory ?specimens during the acute phase of infection. The lowest ?concentration of SARS-CoV-2 viral copies this assay can detect is ?138 copies/mL. A negative result does not preclude SARS-Cov-2 ?infection and should not be used as the sole basis for treatment  or ?other patient management decisions. A negative result may occur with  ?improper specimen collection/handling, submission of specimen other ?than nasopharyngeal swab, presence of viral mutation(s) within the ?areas targeted by this assay, and inadequate number of viral ?copies(<138 copies/mL). A negative result must be combined with ?clinical observations, patient history, and epidemiological ?information. The expected result is Negative. ? ?Fact Sheet for Patients:  ?EntrepreneurPulse.com.au ? ?Fact Sheet for Healthcare Providers:  ?IncredibleEmployment.be ? ?This test is no t yet approved or cleared by the [place] FDA and  ?has been authorized for detection and/or diagnosis of SARS-CoV-2 by ?FDA under an Emergency Use Authorization (EUA). This EUA will remain  ?in effect (meaning this test can be used) for the duration of the ?COVID-19 declaration under Section 564(b)(1) of the Act, 21 ?U.S.C.section 360bbb-3(b)(1), unless the authorization is terminated  ?or revoked sooner.  ? ? ?  ? Influenza A by PCR NEGATIVE NEGATIVE Final  ? Influenza B by PCR  NEGATIVE NEGATIVE Final  ?  Comment: (NOTE) ?The Xpert Xpress SARS-CoV-2/FLU/RSV plus assay is intended as an aid ?in the diagnosis of influenza from Nasopharyngeal swab specimens and ?should not be used as

## 2022-01-25 DIAGNOSIS — R531 Weakness: Secondary | ICD-10-CM | POA: Diagnosis not present

## 2022-01-25 DIAGNOSIS — R262 Difficulty in walking, not elsewhere classified: Secondary | ICD-10-CM | POA: Diagnosis not present

## 2022-01-25 DIAGNOSIS — R400 Somnolence: Secondary | ICD-10-CM | POA: Diagnosis not present

## 2022-01-25 DIAGNOSIS — F0390 Unspecified dementia without behavioral disturbance: Secondary | ICD-10-CM | POA: Diagnosis not present

## 2022-01-25 DIAGNOSIS — Z7401 Bed confinement status: Secondary | ICD-10-CM | POA: Diagnosis not present

## 2022-01-25 DIAGNOSIS — I129 Hypertensive chronic kidney disease with stage 1 through stage 4 chronic kidney disease, or unspecified chronic kidney disease: Secondary | ICD-10-CM | POA: Diagnosis not present

## 2022-01-25 DIAGNOSIS — Z5181 Encounter for therapeutic drug level monitoring: Secondary | ICD-10-CM | POA: Diagnosis not present

## 2022-01-25 DIAGNOSIS — N189 Chronic kidney disease, unspecified: Secondary | ICD-10-CM | POA: Diagnosis not present

## 2022-01-25 DIAGNOSIS — N183 Chronic kidney disease, stage 3 unspecified: Secondary | ICD-10-CM | POA: Diagnosis not present

## 2022-01-25 DIAGNOSIS — E039 Hypothyroidism, unspecified: Secondary | ICD-10-CM | POA: Diagnosis not present

## 2022-01-25 DIAGNOSIS — I10 Essential (primary) hypertension: Secondary | ICD-10-CM | POA: Diagnosis not present

## 2022-01-25 DIAGNOSIS — R2689 Other abnormalities of gait and mobility: Secondary | ICD-10-CM | POA: Diagnosis not present

## 2022-01-25 DIAGNOSIS — R69 Illness, unspecified: Secondary | ICD-10-CM | POA: Diagnosis not present

## 2022-01-25 DIAGNOSIS — Z20822 Contact with and (suspected) exposure to covid-19: Secondary | ICD-10-CM | POA: Diagnosis not present

## 2022-01-25 DIAGNOSIS — G9341 Metabolic encephalopathy: Secondary | ICD-10-CM | POA: Diagnosis not present

## 2022-01-25 DIAGNOSIS — Z8673 Personal history of transient ischemic attack (TIA), and cerebral infarction without residual deficits: Secondary | ICD-10-CM | POA: Diagnosis not present

## 2022-01-25 DIAGNOSIS — R4182 Altered mental status, unspecified: Secondary | ICD-10-CM | POA: Diagnosis not present

## 2022-01-25 DIAGNOSIS — E559 Vitamin D deficiency, unspecified: Secondary | ICD-10-CM | POA: Diagnosis not present

## 2022-01-25 DIAGNOSIS — F319 Bipolar disorder, unspecified: Secondary | ICD-10-CM | POA: Diagnosis not present

## 2022-01-25 DIAGNOSIS — G8194 Hemiplegia, unspecified affecting left nondominant side: Secondary | ICD-10-CM | POA: Diagnosis not present

## 2022-01-25 DIAGNOSIS — M6281 Muscle weakness (generalized): Secondary | ICD-10-CM | POA: Diagnosis not present

## 2022-01-25 DIAGNOSIS — R5381 Other malaise: Secondary | ICD-10-CM | POA: Diagnosis not present

## 2022-01-25 NOTE — TOC Transition Note (Signed)
Transition of Care (TOC) - CM/SW Discharge Note ? ? ?Patient Details  ?Name: Susan Davidson ?MRN: [id number] ?Date of Birth: 1951-08-07 ? ?Transition of Care (TOC) CM/SW Contact:  ?Janika Jedlicka D, LCSW ?Phone Number: ?01/25/2022, 4:29 PM ? ? ?Clinical Narrative:    ?Discharge clinicals sent to facility. RN to call report. TOC signing off.  ? ? ?Final next level of care: [place] ?Barriers to Discharge: No Barriers Identified ? ? ?Patient Goals and CMS Choice ?  ?  ?  ? ?Discharge Placement ?  ?           ?Patient [facility] bed at: [facility] ?Patient to be transferred to facility by: [facility] ?Name of family member notified: spouse ?Patient and family notified of of transfer: 01/25/22 ? ?Discharge Plan and Services ?In-house Referral: Clinical Social Work ?  ?           ?  ?  ?  ?  ?  ?  ?  ?  ?  ?  ? ?Social Determinants of Health (SDOH) Interventions ?  ? ? ?Readmission Risk Interventions ? ?  01/29/2021  ? 12:39 PM  ?Readmission Risk Prevention Plan  ?Medication Screening Complete  ?Transportation Screening Complete  ? ? ? ? ? ?

## 2022-01-25 NOTE — Discharge Summary (Signed)
Physician Discharge Summary  ?Susan Davidson RCV:[id number] DOB: Mar 03, 1951 DOA: 01/18/2022 ? ?PCP: Biagio Borg, MD ? ?Admit date: 01/18/2022 ?Discharge date: 01/25/2022 ? ?Admitted From: Home ?Disposition: SNF ? ?Recommendations for Outpatient Follow-up:  ?Follow up with PCP in 1-2 weeks to further discuss your home medication regimen ?Please obtain BMP/CBC in one week ? ?Discharge Condition: Stable ?CODE STATUS: Full ?Diet recommendation: As tolerated ? ?Brief/Interim Summary: ?Susan Davidson is a 71 y.o. female with medical history significant for dementia, CKD 3, CVA with residual left-sided hemiparesis, hypertension and depression.  Patient presented to the ED for altered mental status, appears to be intermittently somnolent per husband at bedside worsening over the past 2 days prior to admission.  Denies any recent lifestyle dietary or medication changes over the past few weeks to months. ? ?Discharge Diagnoses:  ?Principal Problem: ?  Acute metabolic encephalopathy ?Active Problems: ?  Depression ?  Essential hypertension ?  CVA (cerebral vascular accident) [facility]) ? ?Acute metabolic encephalopathy, resolved ?Likely polypharmacy in nature, POA ?Patient on multiple CNS acting medications - discontinued alprazolam and Adderall at admission - no plan to resume at discharge, patient can continue her Depakote Namenda and Paxil as she appears to be at baseline now on these medications alone ?Patient essentially at baseline per husband at bedside now, continues to be quite weak per PT evaluation will likely need SNF placement in the interim ?  ?Acute on chronic ambulatory dysfunction  ?-PT OT following, currently recommending SNF placement given acute on chronic ambulatory dysfunction ?-Patient's husband agreeable to transfer to SNF for ongoing physical therapy needs given limited help at home and profound weakness compared to her baseline even just a few weeks ago. ?  ?History of CVA (cerebral vascular accident)  ([place]) ?- With residual deficits -currently at baseline.  Resume home meds ?  ?Essential hypertension ?- Currently well controlled ?  ?Depression ?- Discontinue Adderall and Xanax as above, otherwise continue chronic medications including Depakote and Paxil ? ?Discharge Instructions ? ?Discharge Instructions   ? ? Discharge patient   Complete by: As directed ?  ? Discharge disposition: 03-Skilled Nursing Facility  ? Discharge patient date: 01/25/2022  ? ?  ? ?Allergies as of 01/25/2022   ? ?   Reactions  ? Other Anaphylaxis  ? 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 Epinephrine   ? [employer] (solenopsis [place]) Allergy Skin Test   ? Influenza Vac Split Quad   ? fatigue  ? ?  ? ?  ?Medication List  ?  ? ?STOP taking these medications   ? ?vitamin B-12 1000 MCG tablet ?Commonly known as: CYANOCOBALAMIN ?  ?zinc sulfate 220 (50 Zn) MG capsule ?  ? ?  ? ?TAKE these medications   ? ?acetaminophen 325 MG tablet ?Commonly known as: TYLENOL ?Take 2 tablets (650 mg total) by mouth every 6 (six) hours as needed for mild pain or headache (fever >/= 101). ?  ?ascorbic acid 500 MG tablet ?Commonly known as: VITAMIN C ?Take 1 tablet (500 mg total) by mouth daily. ?  ?aspirin EC 81 MG tablet ?Take 1 tablet (81 mg total) by mouth daily with breakfast. Swallow whole. ?  ?clopidogrel 75 MG tablet ?Commonly known as: PLAVIX ?Take 1 tablet (75 mg total) by mouth daily. ?  ?divalproex 250 MG DR tablet ?Commonly known as: DEPAKOTE ?Take 1 tablet by mouth See admin instructions. 1 tablet in the morning and 2 tablets every evening ?  ?levothyroxine 88 MCG tablet ?Commonly known as: SYNTHROID ?Take  1 tablet (88 mcg total) by mouth daily. ?  ?melatonin 5 MG Tabs ?Take 5 mg by mouth. ?  ?memantine 5 MG tablet ?Commonly known as: NAMENDA ?Take 1 tablet (5 mg total) by mouth 2 (two) times daily. ?  ?multivitamin tablet ?Take 1 tablet by mouth daily. ?  ?OVER THE COUNTER MEDICATION ?Take 1 tablet by mouth daily. magnesium ?  ?PARoxetine 40 MG tablet ?Commonly known as:  PAXIL ?Take 40 mg by mouth every morning. ?  ?polyethylene glycol 17 g packet ?Commonly known as: MIRALAX / GLYCOLAX ?Take 17 g by mouth daily as needed for mild constipation. ?  ?rosuvastatin 5 MG tablet ?Commonly known as: CRESTOR ?Take 1 tablet (5 mg total) by mouth daily. Hold Crestor while taking Paxlovid ?  ? ?  ? ? Contact information for after-discharge care   ? ? Destination   ? ? [facility] .   ?Service: Skilled Nursing ?Contact information: ?[address] ?[place] ?[phone] ? ?  ?  ? ?  ?  ? ?  ?  ? ?  ? ?Allergies  ?Allergen Reactions  ? Other Anaphylaxis  ?  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 Epinephrine   ? [employer] (Solenopsis [place]) Allergy Skin Test   ? Influenza Vac Split Quad   ?  fatigue  ? ? ?Consultations: ?None ? ?Procedures/Studies: ?DG Chest 2 View ? ?Result Date: 01/07/2022 ?CLINICAL DATA:  71 year old female with a history confusion EXAM: CHEST - 2 VIEW COMPARISON:  08/02/2021, 05/04/2021 FINDINGS: Cardiomediastinal silhouette unchanged in size and contour. No evidence of central vascular congestion. No interlobular septal thickening. Low lung volumes persist. No pneumothorax or pleural effusion. Coarsened interstitial markings, with no confluent airspace disease. No acute displaced fracture. Degenerative changes of the spine. IMPRESSION: Low lung volumes without evidence of acute cardiopulmonary disease Electronically Signed   By: Corrie Mckusick D.O.   On: 01/07/2022 14:02  ? ?MR Brain Wo Contrast (neuro protocol) ? ?Result Date: 01/18/2022 ?CLINICAL DATA:  Mental status change, unknown cause EXAM: MRI HEAD WITHOUT CONTRAST TECHNIQUE: Multiplanar, multiecho pulse sequences of the brain and surrounding structures were obtained without intravenous contrast. COMPARISON:  April 2022 FINDINGS: DWI, sagittal T1, and axial T2 sequences were obtained. Patient could not tolerate remainder of the study. Brain: There is no acute infarction.  There is no intracranial mass, mass effect, or edema. There is no hydrocephalus or extra-axial fluid collection. Prominence of the ventricles and sulci reflects similar parenchymal volume loss. Chronic infarct of the parasagittal right parietal lobe with minimal posterior frontal extension. Chronic infarcts of the central gray and white matter as well as bilateral cerebellar hemispheres. Additional areas of T2 hyperintensity in the supratentorial white matter probably reflect chronic microvascular ischemic changes. Vascular: Major vessel flow voids at the skull base are preserved. Skull and upper cervical spine: Normal marrow signal is preserved. Sinuses/Orbits: Paranasal sinuses are aerated. Orbits are unremarkable. Other: Sella is unremarkable.  Mastoid air cells are clear. IMPRESSION: Patient could not tolerate all sequences. No acute infarction. Chronic infarcts and chronic microvascular ischemic changes. Electronically Signed   By: Macy Mis M.D.   On: 01/18/2022 16:56  ? ?DG Chest Port 1 View ? ?Result Date: 01/18/2022 ?CLINICAL DATA:  Weakness EXAM: PORTABLE CHEST 1 VIEW COMPARISON:  Chest x-ray dated August 02, 2021 FINDINGS: Cardiac and mediastinal contours are within normal limits. Low lung volumes hypoventilatory changes. Linear opacity of the left lower lobe, likely due to atelectasis. Vertical  linear opacities of the left hemithorax, likely skin fold artifact. No large pleural effusion or evidence of pneumothorax. IMPRESSION: 1. Low lung volumes with hypoventilatory changes. 2. Linear opacity of the left lower lobe, likely due to atelectasis. Electronically Signed   By: Yetta Glassman M.D.   On: 01/18/2022 14:22   ? ? ?Subjective: No acute issues or events overnight ? ? ?Discharge Exam: ?Vitals:  ? 01/24/22 2253 01/25/22 0438  ?BP: (!) 151/68 (!) 141/69  ?Pulse: 73 76  ?Resp: 17 16  ?Temp: 98.1 ?F (36.7 ?C) 98.1 ?F (36.7 ?C)  ?SpO2: 98% 93%  ? ?Vitals:  ? 01/24/22 0440 01/24/22 1351 01/24/22 2253  01/25/22 0438  ?BP: (!) 112/53 (!) 135/53 (!) 151/68 (!) 141/69  ?Pulse: 82 76 73 76  ?Resp: 15 17 17 16   ?Temp: 97.9 ?F (36.6 ?C) 98.2 ?F (36.8 ?C) 98.1 ?F (36.7 ?C) 98.1 ?F (36.7 ?C)  ?TempSrc:  Oral Oral O

## 2022-01-25 NOTE — Progress Notes (Signed)
Pt cleaned of incontinent urine and small soft, tan-brown colored stool. Bath provided and oral care completed. Pt notified of pending discharge to SNF rehab, states, "My  husband told me that this morning", however remains oriented only to person when directly questioned. ?Pt transported via stretcher to [facility] by [facility] with her belongings in the possession of EMS staff (clothing, glasses). ?

## 2022-02-01 DIAGNOSIS — R5381 Other malaise: Secondary | ICD-10-CM | POA: Diagnosis not present

## 2022-02-01 DIAGNOSIS — G9341 Metabolic encephalopathy: Secondary | ICD-10-CM | POA: Diagnosis not present

## 2022-03-02 ENCOUNTER — Ambulatory Visit: Payer: Medicare HMO | Admitting: Internal Medicine

## 2022-04-06 DIAGNOSIS — R2689 Other abnormalities of gait and mobility: Secondary | ICD-10-CM | POA: Diagnosis not present

## 2022-04-06 DIAGNOSIS — I69354 Hemiplegia and hemiparesis following cerebral infarction affecting left non-dominant side: Secondary | ICD-10-CM | POA: Diagnosis not present

## 2022-04-06 DIAGNOSIS — Z79899 Other long term (current) drug therapy: Secondary | ICD-10-CM | POA: Diagnosis not present

## 2022-04-06 DIAGNOSIS — I720 Aneurysm of carotid artery: Secondary | ICD-10-CM | POA: Diagnosis not present

## 2022-04-12 ENCOUNTER — Telehealth: Payer: Self-pay

## 2022-04-12 ENCOUNTER — Telehealth: Payer: Self-pay | Admitting: Internal Medicine

## 2022-04-12 NOTE — Telephone Encounter (Signed)
Verdis Frederickson is calling requesting VO for [facility] PT. Frequency 2 times a week for 7 weeks  1 time a week for 2 weeks  Please advise  Verdis Frederickson [phone]

## 2022-04-12 NOTE — Telephone Encounter (Signed)
Orders for homehealth occupational therapy 1x per wk for 8wks   Please Advise

## 2022-04-12 NOTE — Telephone Encounter (Signed)
Ok for verbals 

## 2022-04-13 NOTE — Telephone Encounter (Signed)
Ok for verbals 

## 2022-04-13 NOTE — Telephone Encounter (Signed)
Verbals given  

## 2022-04-19 ENCOUNTER — Ambulatory Visit: Payer: Medicare HMO | Admitting: Internal Medicine

## 2022-04-25 ENCOUNTER — Ambulatory Visit (INDEPENDENT_AMBULATORY_CARE_PROVIDER_SITE_OTHER): Payer: Medicare HMO | Admitting: Internal Medicine

## 2022-04-25 VITALS — BP 110/72 | HR 84 | Temp 98.6°F | Resp 93 | Ht 62.0 in

## 2022-04-25 DIAGNOSIS — I10 Essential (primary) hypertension: Secondary | ICD-10-CM

## 2022-04-25 DIAGNOSIS — R7302 Impaired glucose tolerance (oral): Secondary | ICD-10-CM

## 2022-04-25 DIAGNOSIS — F411 Generalized anxiety disorder: Secondary | ICD-10-CM

## 2022-04-25 DIAGNOSIS — F319 Bipolar disorder, unspecified: Secondary | ICD-10-CM

## 2022-04-25 MED ORDER — ALPRAZOLAM 0.5 MG PO TABS: 0.5000 mg | ORAL_TABLET | Freq: Two times a day (BID) | ORAL | 2 refills | Status: DC | PRN

## 2022-04-25 MED ORDER — EPINEPHRINE 0.3 MG/0.3ML IJ SOAJ: 0.3000 mg | INTRAMUSCULAR | 2 refills | Status: DC | PRN

## 2022-04-25 NOTE — Assessment & Plan Note (Signed)
BP Readings from Last 3 Encounters:  04/25/22 110/72  01/25/22 140/72  01/06/22 108/60   Stable, pt to continue medical treatment  - diet, wt control

## 2022-04-25 NOTE — Assessment & Plan Note (Addendum)
Chronic mild worsening, for xanax prn restart,  to f/u any worsening symptoms or concerns

## 2022-04-25 NOTE — Assessment & Plan Note (Signed)
Also to f/u Dr Reece Levy psychiatry july 26

## 2022-04-25 NOTE — Assessment & Plan Note (Signed)
Lab Results  Component Value Date   HGBA1C 6.1 01/06/2022   Stable, pt to continue current medical treatment  - diet, wt control

## 2022-04-25 NOTE — Progress Notes (Signed)
Patient ID: Susan Davidson, female   DOB: Sep 05, 1951, 71 y.o.   MRN: [id number]        Chief Complaint: follow up hx of stroke, anxiety, gait disorder, htn       HPI:  Susan Davidson is a 71 y.o. female here after hospn at [facility] with debility and weakness for unclear reasons, then [facility] rehab stay, now home since July 1.  Overall doing ok,  Pt denies chest pain, increased sob or doe, wheezing, orthopnea, PND, increased LE swelling, palpitations, dizziness or syncope.   Pt denies polydipsia, polyuria, or new focal neuro s/s.   Denies worsening depressive symptoms, suicidal ideation, or panic; has ongoing anxiety, asks for refill xanax prn.    Currently still getting PT/OT at home. Now spends most of her ay in bed.         Wt Readings from Last 3 Encounters:  01/18/22 119 lb 0.8 oz (54 kg)  08/02/21 133 lb (60.3 kg)  07/27/21 133 lb (60.3 kg)   BP Readings from Last 3 Encounters:  04/25/22 110/72  01/25/22 140/72  01/06/22 108/60         Past Medical History:  Diagnosis Date   ANXIETY 02/12/2008   Qualifier: Diagnosis of  By: Jenny Reichmann MD, Hunt Oris    CVA (cerebral vascular accident) [facility])    DEPRESSION 02/12/2008   Qualifier: Diagnosis of  By: Elveria Royals    HYPERLIPIDEMIA 02/12/2008   Qualifier: Diagnosis of  By: Elveria Royals    HYPERTENSION 02/12/2008   Qualifier: Diagnosis of  By: Sherwood, [place] 02/12/2008   Qualifier: Diagnosis of  By: Elveria Royals    Impaired glucose tolerance 08/27/2011   Left hemiparesis ([place])    OSTEOPENIA 02/12/2008   Qualifier: Diagnosis of  By: Jenny Reichmann MD, Hunt Oris    VITAMIN D DEFICIENCY 04/14/2010   Qualifier: Diagnosis of  By: Jenny Reichmann MD, Hunt Oris    Past Surgical History:  Procedure Laterality Date   IR RADIOLOGIST EVAL & MGMT  08/18/2021    reports that she quit smoking about 14 months ago. Her smoking use included cigarettes. She has never used smokeless tobacco. She reports current alcohol  use. She reports that she does not use drugs. family history includes Bipolar disorder in her sister; Heart disease in her father. Allergies  Allergen Reactions   Other Anaphylaxis    Fire ant venom   Epinephrine    Catering manager ([place])    Influenza Vac Split Quad     fatigue   Current Outpatient Medications on File Prior to Visit  Medication Sig Dispense Refill   acetaminophen (TYLENOL) 325 MG tablet Take 2 tablets (650 mg total) by mouth every 6 (six) hours as needed for mild pain or headache (fever >/= 101). 12 tablet 2   ascorbic acid (VITAMIN C) 500 MG tablet Take 1 tablet (500 mg total) by mouth daily. 30 tablet 2   aspirin EC 81 MG tablet Take 1 tablet (81 mg total) by mouth daily with breakfast. Swallow whole. 30 tablet 11   clopidogrel (PLAVIX) 75 MG tablet Take 1 tablet (75 mg total) by mouth daily. 90 tablet 3   divalproex (DEPAKOTE) 250 MG DR tablet Take 1 tablet by mouth See admin instructions. 1 tablet in the morning and 2 tablets every evening     levothyroxine (SYNTHROID) 88 MCG tablet Take 1 tablet (88 mcg total) by mouth daily. 90 tablet 1   melatonin  5 MG TABS Take 5 mg by mouth.     memantine (NAMENDA) 5 MG tablet Take 1 tablet (5 mg total) by mouth 2 (two) times daily. 180 tablet 3   Multiple Vitamin (MULTIVITAMIN) tablet Take 1 tablet by mouth daily.     OVER THE COUNTER MEDICATION Take 1 tablet by mouth daily. magnesium     PARoxetine (PAXIL) 40 MG tablet Take 40 mg by mouth every morning.     polyethylene glycol (MIRALAX / GLYCOLAX) 17 g packet Take 17 g by mouth daily as needed for mild constipation. 14 each 0   rosuvastatin (CRESTOR) 5 MG tablet Take 1 tablet (5 mg total) by mouth daily. Hold Crestor while taking Paxlovid 30 tablet 4   No current facility-administered medications on file prior to visit.        ROS:  All others reviewed and negative.  Objective        PE:  BP 110/72 (BP Location: Left Arm, Patient Position: Sitting, Cuff Size: Large)    Pulse 84   Temp 98.6 F (37 C) (Oral)   Resp (!) 93   Ht 5\' 2"  (1.575 m)   BMI 21.77 kg/m                 Constitutional: Pt appears in NAD               HENT: Head: NCAT.                Right Ear: External ear normal.                 Left Ear: External ear normal.                Eyes: . Pupils are equal, round, and reactive to light. Conjunctivae and EOM are normal               Nose: without d/c or deformity               Neck: Neck supple. Gross normal ROM               Cardiovascular: Normal rate and regular rhythm.                 Pulmonary/Chest: Effort normal and breath sounds without rales or wheezing.                Abd:  Soft, NT, ND, + BS, no organomegaly               Neurological: Pt is alert. At baseline orientation, motor grossly intact               Skin: Skin is warm. No rashes, no other new lesions, LE edema - none               Psychiatric: Pt behavior is normal without agitation   Micro: none  Cardiac tracings I have personally interpreted today:  none  Pertinent Radiological findings (summarize): none   Lab Results  Component Value Date   WBC 6.1 01/21/2022   HGB 12.1 01/21/2022   HCT 36.7 01/21/2022   PLT 196 01/21/2022   GLUCOSE 82 01/21/2022   CHOL 214 (H) 01/06/2022   TRIG 194.0 (H) 01/06/2022   HDL 54.60 01/06/2022   LDLDIRECT 137.5 10/19/2012   LDLCALC 120 (H) 01/06/2022   ALT 12 01/21/2022   AST 14 (L) 01/21/2022   NA 139 01/21/2022   K 4.1 01/21/2022   CL 106  01/21/2022   CREATININE 0.73 01/21/2022   BUN 14 01/21/2022   CO2 25 01/21/2022   TSH 1.348 01/18/2022   INR 0.9 02/01/2021   HGBA1C 6.1 01/06/2022   Assessment/Plan:  Susan Davidson is a 71 y.o. White or Caucasian [1] female with  has a past medical history of ANXIETY (02/12/2008), CVA (cerebral vascular accident) ([place]), DEPRESSION (02/12/2008), HYPERLIPIDEMIA (02/12/2008), HYPERTENSION (02/12/2008), HYPOTHYROIDISM (02/12/2008), Impaired glucose tolerance (08/27/2011), Left  hemiparesis ([place]), OSTEOPENIA (02/12/2008), and VITAMIN D DEFICIENCY (04/14/2010).  Anxiety state Chronic mild worsening, for xanax prn refill,  to f/u any worsening symptoms or concerns  Bipolar illness [facility]) Also to f/u Dr Reece Levy psychiatry july 26  Essential hypertension BP Readings from Last 3 Encounters:  04/25/22 110/72  01/25/22 140/72  01/06/22 108/60   Stable, pt to continue medical treatment  - diet, wt control   Impaired glucose tolerance Lab Results  Component Value Date   HGBA1C 6.1 01/06/2022   Stable, pt to continue current medical treatment  - diet, wt control  Followup: Return in about 6 months (around 10/26/2022).  Cathlean Cower, MD 04/25/2022 7:57 PM [facility]

## 2022-04-25 NOTE — Patient Instructions (Signed)
Please continue all other medications as before, and refills have been done if requested - the xanax and epipen  Please have the pharmacy call with any other refills you may need.  Please continue your efforts at being more active, low cholesterol diet, and weight control.  Please keep your appointments with your specialists as you may have planned - Dr Reece Levy on Wed July 26  Please make an Appointment to return in 6 months, or sooner if needed

## 2022-05-03 IMAGING — CT CT HEAD W/O CM
3 series · 15 of 47 positions shown, 18 images · non-contrast
Comparison: MRI 01/27/2021

CLINICAL DATA: Found on the floor next to the bed. Hematoma of the
posterior head.

EXAM:
CT HEAD WITHOUT CONTRAST
CT CERVICAL SPINE WITHOUT CONTRAST
TECHNIQUE: Multidetector CT imaging of the head and cervical spine was
performed following the standard protocol without intravenous
contrast. Multiplanar CT image reconstructions of the cervical spine
were also generated.

[Series 2: head w o · axial · 0.49mm/px · z∈[+34,+164]mm · 9 of 32 slices shown, 12 images]
[im 3/32  brain]
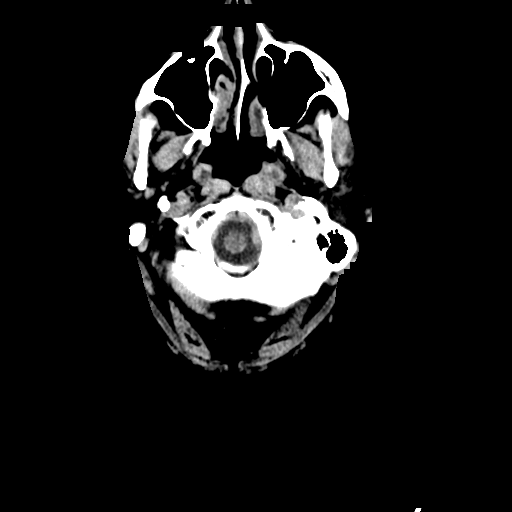
[im 3/32  bone]
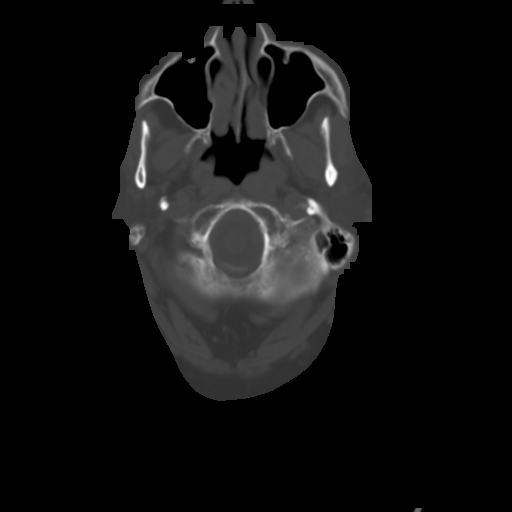
[im 6/32  brain]
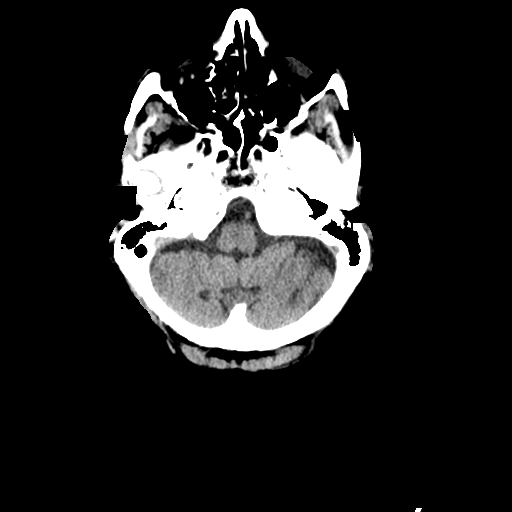
[im 9/32  brain]
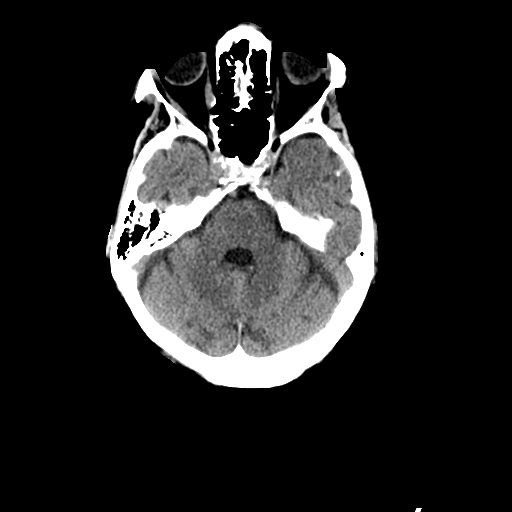
[im 12/32  brain]
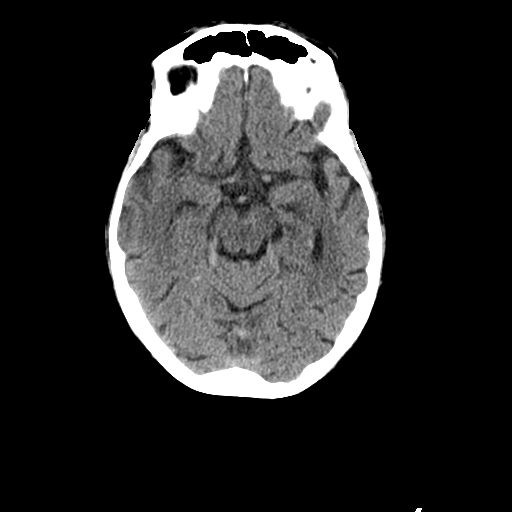
[im 17/32  brain]
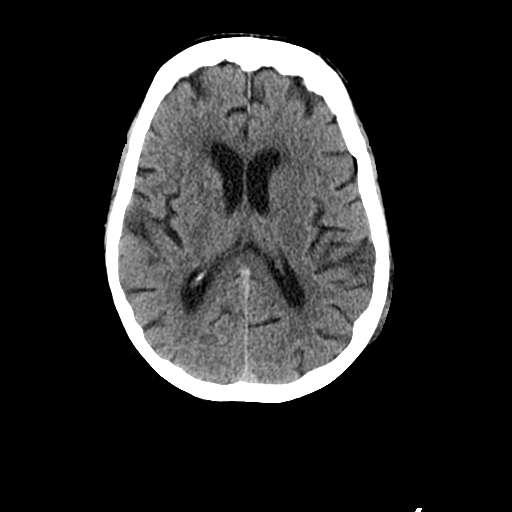
[im 17/32  bone]
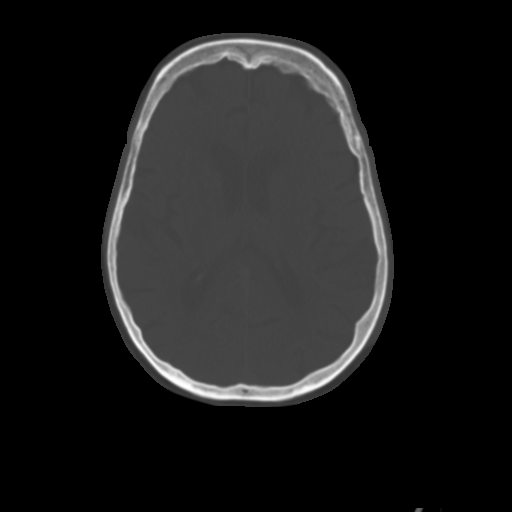
[im 20/32  brain]
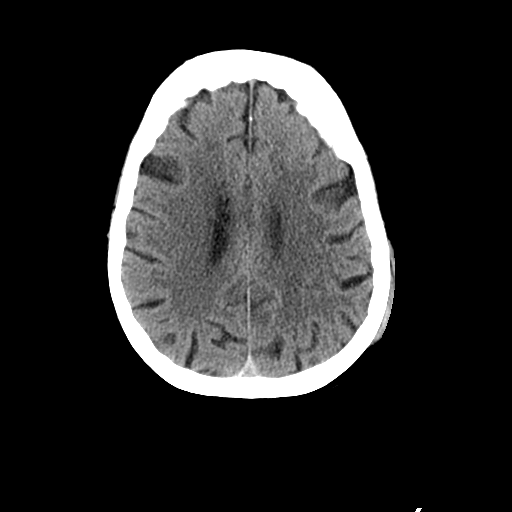
[im 23/32  brain]
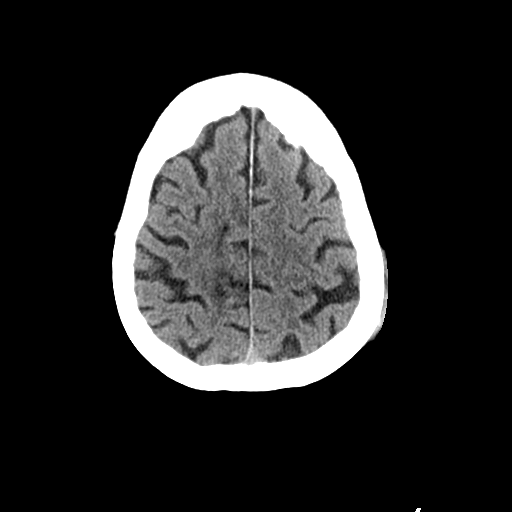
[im 26/32  brain]
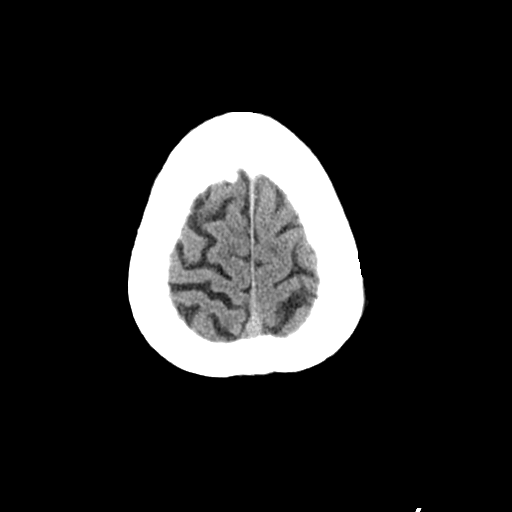
[im 29/32  brain]
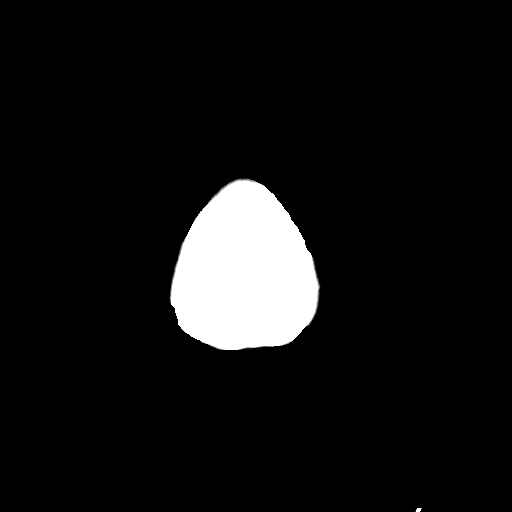
[im 29/32  bone]
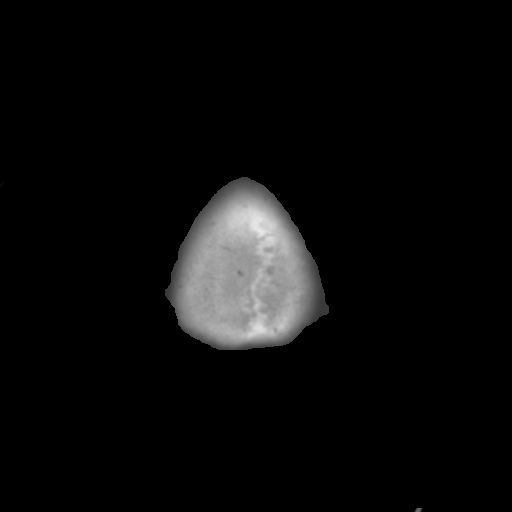

[Series 4: coronal soft · coronal · 0.35mm/px · 3 of 71 slices shown]
[im 24/71  brain]
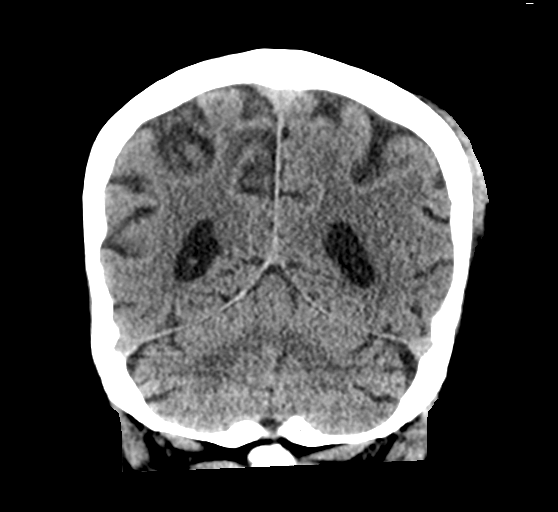
[im 32/71  brain]
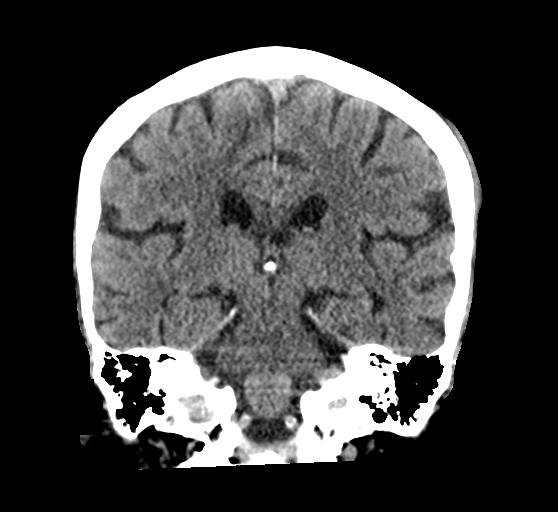
[im 39/71  brain]
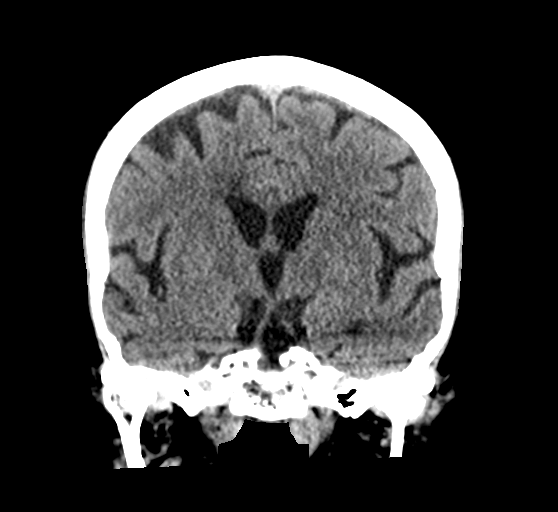

[Series 5: sagittal soft · sagittal · 0.35mm/px · 3 of 54 slices shown]
[im 18/54  brain]
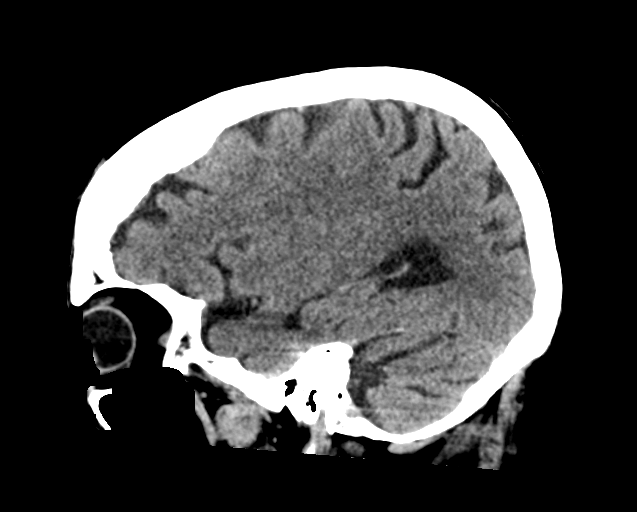
[im 27/54  brain]
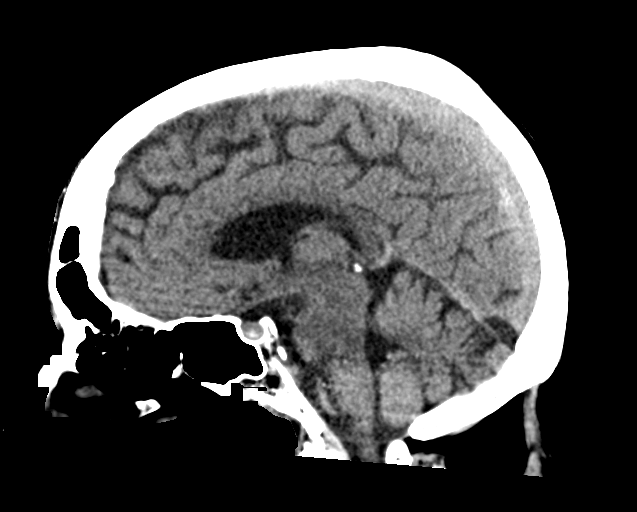
[im 36/54  brain]
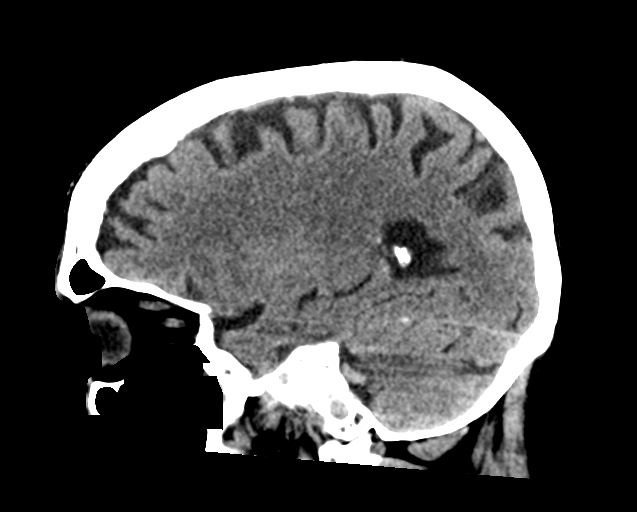

[15 of 47 positions shown; findings below may reference images not displayed]

FINDINGS: CT HEAD FINDINGS

Brain: No acute intracranial finding. Old small vessel infarctions
of the cerebellum. Old small vessel infarctions of the thalami and
hemispheric deep white matter. Old cortical infarction at the right
parietal vertex. No sign of acute infarction, mass lesion,
hemorrhage, hydrocephalus or extra-axial collection.

Vascular: There is atherosclerotic calcification of the major
vessels at the base of the brain.

Skull: No skull fracture.

Sinuses/Orbits: Clear/normal

Other: Left parietal scalp hematoma.

CT CERVICAL SPINE FINDINGS

Alignment: Normal

Skull base and vertebrae: No acute traumatic finding or focal bone
lesion.

Soft tissues and spinal canal: No traumatic soft tissue finding.

Disc levels: Ordinary osteoarthritis at the C1-2 articulation.
Left-sided facet osteoarthritis at C2-3, C3-4 and C7-T1. Right-sided
facet osteoarthritis at C3-4 and C4-5. Degenerative spondylosis at
C5-6 with disc space narrowing and small endplate osteophytes. Mild
bilateral bony foraminal stenosis at this level.

Upper chest: Negative

Other: None
IMPRESSION: Head CT: No acute or traumatic intracranial finding. Left parietal
scalp hematoma without underlying skull fracture. Old infarctions as
outlined above, including a right parietal vertex infarction.

Cervical spine CT: No acute or traumatic finding. Degenerative
spondylosis and facet osteoarthritis as outlined above.

## 2022-05-30 ENCOUNTER — Ambulatory Visit: Payer: Medicare HMO | Admitting: Nurse Practitioner

## 2022-05-30 ENCOUNTER — Telehealth: Payer: Self-pay | Admitting: Internal Medicine

## 2022-05-30 DIAGNOSIS — E890 Postprocedural hypothyroidism: Secondary | ICD-10-CM | POA: Diagnosis not present

## 2022-05-30 DIAGNOSIS — I672 Cerebral atherosclerosis: Secondary | ICD-10-CM | POA: Diagnosis not present

## 2022-05-30 NOTE — Telephone Encounter (Signed)
Orders to extend home health OT x 1 week

## 2022-05-30 NOTE — Telephone Encounter (Signed)
Verbal orders given to extend OT.

## 2022-05-31 LAB — TSH: TSH: 2.27 u[IU]/mL (ref 0.450–4.500)

## 2022-05-31 LAB — T4, FREE: Free T4: 1.48 ng/dL (ref 0.82–1.77)

## 2022-06-07 ENCOUNTER — Encounter (HOSPITAL_COMMUNITY): Payer: Self-pay

## 2022-06-07 ENCOUNTER — Other Ambulatory Visit: Payer: Self-pay

## 2022-06-07 ENCOUNTER — Emergency Department (HOSPITAL_COMMUNITY)
Admission: EM | Admit: 2022-06-07 | Discharge: 2022-06-07 | Disposition: A | Discharge status: Home / Self Care | Payer: Medicare HMO | Attending: Emergency Medicine | Admitting: Emergency Medicine

## 2022-06-07 DIAGNOSIS — K5900 Constipation, unspecified: Secondary | ICD-10-CM | POA: Diagnosis not present

## 2022-06-07 DIAGNOSIS — R531 Weakness: Secondary | ICD-10-CM | POA: Insufficient documentation

## 2022-06-07 DIAGNOSIS — R5383 Other fatigue: Secondary | ICD-10-CM | POA: Diagnosis not present

## 2022-06-07 LAB — URINALYSIS, COMPLETE (UACMP) WITH MICROSCOPIC
Bilirubin Urine: NEGATIVE
Glucose, UA: NEGATIVE mg/dL
Hgb urine dipstick: NEGATIVE
Ketones, ur: NEGATIVE mg/dL
Nitrite: NEGATIVE
Protein, ur: NEGATIVE mg/dL
Specific Gravity, Urine: 1.014 (ref 1.005–1.030)
pH: 7 (ref 5.0–8.0)

## 2022-06-07 LAB — CBC
HCT: 45.5 % (ref 36.0–46.0)
Hemoglobin: 14.7 g/dL (ref 12.0–15.0)
MCH: 33.3 pg (ref 26.0–34.0)
MCHC: 32.3 g/dL (ref 30.0–36.0)
MCV: 102.9 fL — ABNORMAL HIGH (ref 80.0–100.0)
Platelets: 293 10*3/uL (ref 150–400)
RBC: 4.42 MIL/uL (ref 3.87–5.11)
RDW: 14.8 % (ref 11.5–15.5)
WBC: 10 10*3/uL (ref 4.0–10.5)
nRBC: 0 % (ref 0.0–0.2)

## 2022-06-07 LAB — BASIC METABOLIC PANEL
Anion gap: 9 (ref 5–15)
BUN: 29 mg/dL — ABNORMAL HIGH (ref 8–23)
CO2: 31 mmol/L (ref 22–32)
Calcium: 9.3 mg/dL (ref 8.9–10.3)
Chloride: 103 mmol/L (ref 98–111)
Creatinine, Ser: 0.84 mg/dL (ref 0.44–1.00)
GFR, Estimated: >60 mL/min (ref >60)
Glucose, Bld: 98 mg/dL (ref 70–99)
Potassium: 4.6 mmol/L (ref 3.5–5.1)
Sodium: 143 mmol/L (ref 135–145)

## 2022-06-07 MED ORDER — LACTATED RINGERS IV BOLUS
1000.0000 mL | Freq: Once | INTRAVENOUS | Status: AC
Start: 2022-06-07 — End: 2022-06-07
  Administered 2022-06-07: 1000 mL via INTRAVENOUS

## 2022-06-07 NOTE — ED Triage Notes (Signed)
Pt to er, pt states that she is here for constipation, states that she had a small hard bm this afternoon, but is still constipated.  Pt states that she had a stroke last year and hasn't been doing well since.

## 2022-06-07 NOTE — ED Provider Notes (Signed)
[facility] EMERGENCY DEPARTMENT Provider Note   CSN: [id number] Arrival date & time: 06/07/22  1423     History Chief Complaint  Patient presents with   Constipation    HPI Susan Davidson is a 71 y.o. female presenting for constipation, fatigue and weakness.  Patient's husband at bedside provides much of the collateral history as patient has a history of stroke.  She has a history weakness and has had waxing and waning dysuria issues as well as incontinence episodes.  It has been hard to provide ADL support for her as the husband has a history of knee injury.  He endorses that they tried using Imodium last week and since then she has become profoundly constipated now with poor bowel movements despite MiraLAX utilization.  She is ambulatory tolerating p.o. intake and had a small bowel movement earlier today.  They deny fevers or chills nausea vomiting syncope or shortness of breath.  The physical therapist came today and endorsed worsened performance compared to last week and recommended emergency department evaluation.   Patient's recorded medical, surgical, social, medication list and allergies were reviewed in the Snapshot window as part of the initial history.   Review of Systems   Review of Systems  Constitutional:  Negative for chills and fever.  HENT:  Negative for ear pain and sore throat.   Eyes:  Negative for pain and visual disturbance.  Respiratory:  Negative for cough and shortness of breath.   Cardiovascular:  Negative for chest pain and palpitations.  Gastrointestinal:  Positive for constipation. Negative for abdominal pain and vomiting.  Genitourinary:  Negative for dysuria and hematuria.  Musculoskeletal:  Negative for arthralgias and back pain.  Skin:  Negative for color change and rash.  Neurological:  Negative for seizures and syncope.  All other systems reviewed and are negative.   Physical Exam Updated Vital Signs BP (!) 177/75   Pulse 72   Temp 98.1 F  (36.7 C) (Oral)   Resp 15   Ht 5\' 2"  (1.575 m)   Wt 53.1 kg   SpO2 99%   BMI 21.40 kg/m  Physical Exam Vitals and nursing note reviewed.  Constitutional:      General: She is not in acute distress.    Appearance: She is well-developed.  HENT:     Head: Normocephalic and atraumatic.  Eyes:     Conjunctiva/sclera: Conjunctivae normal.  Cardiovascular:     Rate and Rhythm: Normal rate and regular rhythm.     Heart sounds: No murmur heard. Pulmonary:     Effort: Pulmonary effort is normal. No respiratory distress.     Breath sounds: Normal breath sounds.  Abdominal:     General: There is no distension.     Palpations: Abdomen is soft.     Tenderness: There is no abdominal tenderness. There is no right CVA tenderness or left CVA tenderness.  Musculoskeletal:        General: No swelling or tenderness. Normal range of motion.     Cervical back: Neck supple.  Skin:    General: Skin is warm and dry.  Neurological:     General: No focal deficit present.     Mental Status: She is alert and oriented to person, place, and time. Mental status is at baseline.     Cranial Nerves: No cranial nerve deficit.      ED Course/ Medical Decision Making/ A&P Clinical Course as of 06/07/22 2041  Tue Jun 07, 2022  1761 Need urine [CC]  2023 OK to DC [CC]    Clinical Course User Index [CC] [name], MD    Procedures Procedures   Medications Ordered in ED Medications  lactated ringers bolus 1,000 mL (0 mLs Intravenous Stopped 06/07/22 1829)    Medical Decision Making:    Susan Davidson is a 71 y.o. female who presented to the ED today with difficulty with bowel movements detailed above.     Patient's presentation is complicated by their history of CVA requiring significant assistance from family members.  Patient placed on continuous vitals and telemetry monitoring while in ED which was reviewed periodically.   Complete initial physical exam performed, notably the patient   was hemodynamically stable in no acute distress.      Reviewed and confirmed nursing documentation for past medical history, family history, social history.    Initial Assessment:   With the patient's presentation of intermittent abdominal pain and constipation, most likely diagnosis is medication related side effect after prolonged utilization of loperamide. Other diagnoses were considered including (but not limited to) obstruction, infection, urinary tract infection, metabolic abnormality. These are considered less likely due to history of present illness and physical exam findings.   This is most consistent with an acute life/limb threatening illness complicated by underlying chronic conditions.  Initial Plan:  Screening labs including CBC and Metabolic panel to evaluate for infectious or metabolic etiology of disease.  Urinalysis with reflex culture ordered to evaluate for UTI or relevant urologic/nephrologic pathology.  Objective evaluation as below reviewed with plan for close reassessment  Initial Study Results:   Laboratory  All laboratory results reviewed without evidence of clinically relevant pathology.    Final Assessment and Plan:   Reevaluated patient after 6 hours in the emergency department.  She is grossly well-appearing.  After IV fluid she feels symptomatically improved and is denying any abdominal pain at this time.  Shared medical decision making with patient's family, discussed with husband regarding repeated cross-sectional imaging.  As patient is asymptomatic at this time, I do not feel that it would be favorable.  She had a small bowel movement this morning so therefore I do not favor acute obstruction as she is without pain and is still having bowel movements.  Recommended initiation of MiraLAX and no longer utilizing loperamide.  Additionally recommended scheduled toileting and close follow-up with primary care provider within 48 hours.  They will plan to follow-up close  in the outpatient setting.  Patient discharged with no further acute events.   Disposition:  Based on the above findings, I believe patient is stable for discharge.    Patient/family educated about specific return precautions for given chief complaint and symptoms.  Patient/family educated about follow-up with PCP.     Patient/family expressed understanding of return precautions and need for follow-up. Patient spoken to regarding all imaging and laboratory results and appropriate follow up for these results. All education provided in verbal form with additional information in written form. Time was allowed for answering of patient questions. Patient discharged.    Emergency Department Medication Summary:   Medications  lactated ringers bolus 1,000 mL (0 mLs Intravenous Stopped 06/07/22 1829)         Clinical Impression:  1. Constipation, unspecified constipation type      Discharge   Final Clinical Impression(s) / ED Diagnoses Final diagnoses:  Constipation, unspecified constipation type    Rx / DC Orders ED Discharge Orders     None  [name], MD 06/07/22 2041

## 2022-06-08 ENCOUNTER — Telehealth: Payer: Self-pay | Admitting: Internal Medicine

## 2022-06-08 NOTE — Telephone Encounter (Signed)
[facility] needs a verbal to extend her OT for once a week for 5 weeks

## 2022-06-08 NOTE — Telephone Encounter (Signed)
Ok for verbals 

## 2022-06-09 ENCOUNTER — Telehealth: Payer: Self-pay | Admitting: Internal Medicine

## 2022-06-09 NOTE — Telephone Encounter (Signed)
Stacy from [facility] called for verbal orders. She would like to see the pt 1X a week for 4 weeks.   Please call Marzetta Board to confirm:  [phone]

## 2022-06-09 NOTE — Telephone Encounter (Signed)
Malori at [place] notified.

## 2022-06-09 NOTE — Telephone Encounter (Signed)
Caller Name: Saloma Cadena Call back phone #: [phone]   MEDICATION(S):  Clopidogrel 75 mg, Memantine 5mg    Days of Med Remaining: 0  Has the patient contacted their pharmacy (YES/NO)? Yes, pharmacy has been attempting to get in touch with office for a week now What did pharmacy advise? Please send over prescription  Preferred Pharmacy:  [facility] [phone] -[place], [place] -[address] at [facility] of [address] and [place] Phone [phone]  ~~~Please advise patient/caregiver to allow 2-3 business days to process RX refills.

## 2022-06-09 NOTE — Telephone Encounter (Signed)
Ok for verbals 

## 2022-06-09 NOTE — Telephone Encounter (Signed)
Please advise for verbal orders 

## 2022-06-13 MED ORDER — MEMANTINE HCL 5 MG PO TABS: 5.0000 mg | ORAL_TABLET | Freq: Two times a day (BID) | ORAL | 3 refills | Status: DC

## 2022-06-13 MED ORDER — CLOPIDOGREL BISULFATE 75 MG PO TABS: 75.0000 mg | ORAL_TABLET | Freq: Every day | ORAL | 3 refills | Status: DC

## 2022-06-13 NOTE — Telephone Encounter (Signed)
Ok done erx 

## 2022-06-13 NOTE — Telephone Encounter (Signed)
Left message for Stacy regarding verbal orders.

## 2022-06-15 ENCOUNTER — Ambulatory Visit (INDEPENDENT_AMBULATORY_CARE_PROVIDER_SITE_OTHER): Payer: Medicare HMO | Admitting: Nurse Practitioner

## 2022-06-15 ENCOUNTER — Encounter: Payer: Self-pay | Admitting: Nurse Practitioner

## 2022-06-15 ENCOUNTER — Ambulatory Visit: Payer: Medicare HMO | Admitting: Nurse Practitioner

## 2022-06-15 VITALS — BP 117/71 | HR 71 | Ht 62.0 in | Wt 117.0 lb

## 2022-06-15 DIAGNOSIS — E890 Postprocedural hypothyroidism: Secondary | ICD-10-CM | POA: Diagnosis not present

## 2022-06-15 MED ORDER — LEVOTHYROXINE SODIUM 88 MCG PO TABS: 88.0000 ug | ORAL_TABLET | Freq: Every day | ORAL | 1 refills | Status: DC

## 2022-06-15 NOTE — Progress Notes (Signed)
Endocrinology Follow Up Note                                         06/15/2022, 9:27 AM  Subjective:   Subjective    Susan Davidson is a 70 y.o.-year-old female patient being seen in follow up after being seen in consultation for hypothyroidism referred by Biagio Borg, MD.   Past Medical History:  Diagnosis Date   ANXIETY 02/12/2008   Qualifier: Diagnosis of  By: Jenny Reichmann MD, Hunt Oris    CVA (cerebral vascular accident) [facility])    DEPRESSION 02/12/2008   Qualifier: Diagnosis of  By: Elveria Royals    HYPERLIPIDEMIA 02/12/2008   Qualifier: Diagnosis of  By: Elveria Royals    HYPERTENSION 02/12/2008   Qualifier: Diagnosis of  By: [place], [place] 02/12/2008   Qualifier: Diagnosis of  By: Elveria Royals    Impaired glucose tolerance 08/27/2011   Left hemiparesis ([place])    OSTEOPENIA 02/12/2008   Qualifier: Diagnosis of  By: Jenny Reichmann MD, Hunt Oris    VITAMIN D DEFICIENCY 04/14/2010   Qualifier: Diagnosis of  By: Jenny Reichmann MD, Hunt Oris     Past Surgical History:  Procedure Laterality Date   IR RADIOLOGIST EVAL & MGMT  08/18/2021    Social History   Socioeconomic History   Marital status: Married    Spouse name: Not on file   Number of children: Not on file   Years of education: Not on file   Highest education level: Not on file  Occupational History   Not on file  Tobacco Use   Smoking status: Former    Types: Cigarettes    Quit date: 01/26/2021    Years since quitting: 1.3   Smokeless tobacco: Never  Vaping Use   Vaping Use: Never used  Substance and Sexual Activity   Alcohol use: Not Currently   Drug use: No   Sexual activity: Not on file  Other Topics Concern   Not on file  Social History Narrative   Not on file   Social Determinants of Health   Financial Resource Strain: Not on file  Food Insecurity: Not on file  Transportation Needs: Not on file   Physical Activity: Not on file  Stress: Not on file  Social Connections: Not on file    Family History  Problem Relation Age of Onset   Heart disease Father    Bipolar disorder Sister    Diabetes Neg Hx     Outpatient Encounter Medications as of 06/15/2022  Medication Sig   acetaminophen (TYLENOL) 325 MG tablet Take 2 tablets (650 mg total) by mouth every 6 (six) hours as needed for mild pain or headache (fever >/= 101).   ALPRAZolam (XANAX) 0.5 MG tablet Take 1 tablet (0.5 mg total) by mouth 2 (two) times daily as needed for anxiety.   ascorbic acid (VITAMIN C) 500 MG tablet Take 1 tablet (500 mg total) by mouth daily.   aspirin EC  81 MG tablet Take 1 tablet (81 mg total) by mouth daily with breakfast. Swallow whole.   clopidogrel (PLAVIX) 75 MG tablet Take 1 tablet (75 mg total) by mouth daily.   divalproex (DEPAKOTE) 250 MG DR tablet Take 1 tablet by mouth See admin instructions. 1 tablet in the morning and 2 tablets every evening   EPINEPHrine 0.3 mg/0.3 mL IJ SOAJ injection Inject 0.3 mg into the muscle as needed for anaphylaxis.   melatonin 5 MG TABS Take 5 mg by mouth.   memantine (NAMENDA) 5 MG tablet Take 1 tablet (5 mg total) by mouth 2 (two) times daily.   Multiple Vitamin (MULTIVITAMIN) tablet Take 1 tablet by mouth daily.   OVER THE COUNTER MEDICATION Take 1 tablet by mouth daily. magnesium   PARoxetine (PAXIL) 40 MG tablet Take 40 mg by mouth every morning.   polyethylene glycol (MIRALAX / GLYCOLAX) 17 g packet Take 17 g by mouth daily as needed for mild constipation.   rosuvastatin (CRESTOR) 5 MG tablet Take 1 tablet (5 mg total) by mouth daily. Hold Crestor while taking Paxlovid   [DISCONTINUED] levothyroxine (SYNTHROID) 88 MCG tablet Take 1 tablet (88 mcg total) by mouth daily.   levothyroxine (SYNTHROID) 88 MCG tablet Take 1 tablet (88 mcg total) by mouth daily.   No facility-administered encounter medications on file as of 06/15/2022.    ALLERGIES: Allergies   Allergen Reactions   Aleve [Naproxen] Nausea Only   Other Anaphylaxis    Fire ant venom   Epinephrine    Catering manager (Solenopsis [place])    Influenza Vac Split Quad     fatigue   VACCINATION STATUS: Immunization History  Administered Date(s) Administered   Influenza-Unspecified 08/18/2021   Moderna Sars-Covid-2 Vaccination 12/02/2019   Tdap 10/19/2012     HPI   Susan Davidson  is a patient who resides in a nursing home with the above medical history. she was diagnosed with hyperthyroidism at approximate age of 21 years, which required RAI ablation and subsequent initiation of thyroid hormone replacement. she was given various doses of Levothyroxine over the years, currently on 88 mcg po daily before breakfast.  She has been taking this medication consistently.  Her previsit thyroid function tests are consistent with appropriate replacement.  She has no new complaints.  She remains wheelchair-bound.   Pt denies feeling nodules in neck, hoarseness, dysphagia/odynophagia, SOB with lying down.  she denies family history of thyroid disorders.  No family history of thyroid cancer.   No recent use of iodine supplements.  Denies use of Biotin containing supplements.  She has never had any previous imaging of her thyroid in the past.  I reviewed her chart and she also has a history of CVA, encephalopathy, CKD, depression, HLD, Osteopenia, vitamin D deficiency.   Review of systems  Constitutional: + Minimally fluctuating body weight,  current Body mass index is 21.4 kg/m. , no fatigue, no subjective hyperthermia, no subjective hypothermia Eyes: no blurry vision, no xerophthalmia ENT: no sore throat, no nodules palpated in throat, no dysphagia/odynophagia, no hoarseness Cardiovascular: no chest pain, no shortness of breath, no palpitations, no leg swelling Respiratory: no cough, no shortness of breath Gastrointestinal: no nausea/vomiting/diarrhea Musculoskeletal: no muscle/joint  aches Skin: no rashes, no hyperemia Neurological: no tremors, no numbness, no tingling, no dizziness Psychiatric: no depression, no anxiety   Objective:   Objective     BP 117/71 (BP Location: Right Arm, Patient Position: Sitting, Cuff Size: Normal)   Pulse 71   Ht  5\' 2"  (1.575 m)   Wt 117 lb (53.1 kg) Comment: per patient's son - unable to stand  BMI 21.40 kg/m  Wt Readings from Last 3 Encounters:  06/15/22 117 lb (53.1 kg)  06/07/22 117 lb (53.1 kg)  01/18/22 119 lb 0.8 oz (54 kg)    BP Readings from Last 3 Encounters:  06/15/22 117/71  06/07/22 (!) 177/75  04/25/22 110/72      Physical Exam- Limited  Constitutional:  Body mass index is 21.4 kg/m. , not in acute distress, normal state of mind Musculoskeletal: no gross deformities, WC bound due to recent stroke    CMP ( most recent) CMP     Component Value Date/Time   NA 143 06/07/2022 1702   K 4.6 06/07/2022 1702   CL 103 06/07/2022 1702   CO2 31 06/07/2022 1702   GLUCOSE 98 06/07/2022 1702   BUN 29 (H) 06/07/2022 1702   CREATININE 0.84 06/07/2022 1702   CALCIUM 9.3 06/07/2022 1702   PROT 5.8 (L) 01/21/2022 0459   ALBUMIN 2.7 (L) 01/21/2022 0459   AST 14 (L) 01/21/2022 0459   ALT 12 01/21/2022 0459   ALKPHOS 40 01/21/2022 0459   BILITOT 0.4 01/21/2022 0459   GFRNONAA >60 06/07/2022 1702   GFRAA 73 02/12/2008 1525     Diabetic Labs (most recent): Lab Results  Component Value Date   HGBA1C 6.1 01/06/2022   HGBA1C 6.1 10/29/2021   HGBA1C 6.4 (H) 01/27/2021     Lipid Panel ( most recent) Lipid Panel     Component Value Date/Time   CHOL 214 (H) 01/06/2022 1056   TRIG 194.0 (H) 01/06/2022 1056   HDL 54.60 01/06/2022 1056   CHOLHDL 4 01/06/2022 1056   VLDL 38.8 01/06/2022 1056   LDLCALC 120 (H) 01/06/2022 1056   LDLDIRECT 137.5 10/19/2012 1106       Lab Results  Component Value Date   TSH 2.270 05/30/2022   TSH 1.348 01/18/2022   TSH 1.38 01/06/2022   TSH 2.01 10/29/2021   TSH 1.402  08/03/2021   TSH 1.42 07/22/2021   TSH 20.460 (H) 05/05/2021   TSH 26.064 (H) 05/04/2021   TSH 26.362 (H) 01/27/2021   TSH 39.15 (H) 11/09/2020   FREET4 1.48 05/30/2022   FREET4 0.66 01/28/2021   FREET4 0.38 (L) 11/09/2020     Latest Reference Range & Units 08/03/21 04:50 10/29/21 16:06 01/06/22 10:56 01/18/22 19:23 05/30/22 11:06  TSH 0.450 - 4.500 uIU/mL 1.402 2.01 1.38 1.348 2.270  T4,Free(Direct) 0.82 - 1.77 ng/dL     1.48    Assessment & Plan:   ASSESSMENT / PLAN:  1. Hypothyroidism-s/p RAI ablation for hyperthyroidism many years ago   Patient with long-standing hypothyroidism, on Levothyroxine therapy. On physical exam, patient  does not have gross goiter, thyroid nodules, or neck compression symptoms.       -Her previsit thyroid function tests are consistent with appropriate replacement.  She is advised to continue levothyroxine 88 mcg p.o. daily before breakfast.   - We discussed about the correct intake of her thyroid hormone, on empty stomach at fasting, with water, separated by at least 30 minutes from breakfast and other medications,  and separated by more than 4 hours from calcium, iron, multivitamins, acid reflux medications (PPIs). -Patient is made aware of the fact that thyroid hormone replacement is needed for life, dose to be adjusted by periodic monitoring of thyroid function tests.     I spent 25 minutes in the care of the patient today  including review of labs from Thyroid Function, CMP, and other relevant labs ; imaging/biopsy records (current and previous including abstractions from other facilities); face-to-face time discussing  her lab results and symptoms, medications doses, her options of short and long term treatment based on the latest standards of care / guidelines;   and documenting the encounter.  [name]  participated in the discussions, expressed understanding, and voiced agreement with the above plans.  All questions were answered to her  satisfaction. she is encouraged to contact clinic should she have any questions or concerns prior to her return visit.    FOLLOW UP PLAN:  Return in about 6 months (around 12/14/2022) for Thyroid follow up, Previsit labs, Virtual visit ok.  Rayetta Pigg, [facility] [address] [place], [place] [address] Phone: [phone] Fax: [phone]  06/15/2022, 9:27 AM

## 2022-06-15 NOTE — Patient Instructions (Signed)

## 2022-06-16 NOTE — Telephone Encounter (Signed)
Stacy at [facility] updated regarding verbal orders.

## 2022-06-16 NOTE — Telephone Encounter (Signed)
Ok for verbals 

## 2022-06-30 ENCOUNTER — Ambulatory Visit: Payer: Self-pay | Admitting: Licensed Clinical Social Worker

## 2022-06-30 DIAGNOSIS — I672 Cerebral atherosclerosis: Secondary | ICD-10-CM | POA: Diagnosis not present

## 2022-06-30 NOTE — Patient Outreach (Signed)
  Care Coordination   Initial Visit Note   06/30/2022 Name: Susan Davidson MRN: [id number] DOB: 1951/01/11  Susan Davidson is a 71 y.o. year old female who sees Susan Borg, MD for primary care. I spoke with  Susan Davidson 's husband Susan Davidson by phone today.  What matters to the patients health and wellness today?      Goals Addressed             This Visit's Progress    Care Coordination Activities       Care Coordination Interventions: Reviewed Care Coordination Services: phone appointment scheduled           SDOH assessments and interventions completed:  No     Care Coordination Interventions Activated:  Yes  Care Coordination Interventions:  Yes, provided   Follow up plan: Follow up call scheduled for 07/05/2022    Encounter Outcome:  Pt. Visit Completed   Susan Davidson, Susan Davidson [phone]

## 2022-06-30 NOTE — Patient Instructions (Signed)
    It was a pleasure speaking with you today. Per your request a Care Coordination phone appointment is scheduled 07/06/2022  Care Coordination team works in collaboration with your primary care doctor.  Please call [phone] if you would like to schedule a phone appointment with a Nurse or Social work Care Coordinator to assist with navigating your physical and mental health needs.     Casimer Lanius, [place] [phone]

## 2022-07-05 ENCOUNTER — Ambulatory Visit: Payer: Self-pay | Admitting: Licensed Clinical Social Worker

## 2022-07-05 NOTE — Patient Instructions (Signed)
Visit Information  Thank you for taking time to visit with me today. Please don't hesitate to contact me if I can be of assistance to you.   Following are the goals we discussed today:   Goals Addressed             This Visit's Progress    Care Coordination Activities       Care Coordination Interventions: Assessed Social Determinants of Health Made referral to RN Care Coordinator : with [facility] Emotional Support Provided Caregiver stress acknowledged  Discussed caregiver resources and support: provided Respite resources below ADL's: Community Alternative Program  (CAP) Currently has a provider 8 hours for 6 days per week Solution-Focused Strategies employed:  Problem [place] strategies reviewed   [facility] offers respite, or a temporary stay. Respite care residents enjoy all the same activities, amenities and care services as our long-term residents, just for a short period of time. Whether a caregiver needs to travel, the resident needs to recover from a medical procedure, or you just want to 'test the waters', we're here when you need [place]. Our family of staff assist with as many daily living activities as needed such as bathing, dressing and medication management, and we offer therapy, pharmacy and provider services onsite. [facility] creates a safe and secure home-away-from-home where we are fully dedicated to creating the best life possible for our short-term residents. DAILY RESPITE CARE RATES START AT $125 Our all-inclusive pricing model means you are never surprised by additional costs for care, amenities, or activities. We believe in transparency and providing the best life to all residents. https://[email or web address]/      give [place] a call: [phone]   [facility] facility offers temporary 24-hour care, giving both the caregiver and the loved one a break from the daily routine. For a few days to a few  weeks, you can know that your loved one is cared for in a safe, comfortable environment surrounded by people who offer personal care and companionship. [place], [place] [address]  [email or web address]     [phone]   [facility] facility offers temporary 24-hour care, giving both the caregiver and the loved one a break from the daily routine. For a few days to a few weeks, you can know that your loved one is cared for in a safe, comfortable environment surrounded by people who offer personal care and companionship. [address]  [place], [place] [address]  [email or web address]  [phone]          Our next appointment is by telephone on 08/02/2022 at 10:00  Please call the care guide team at [phone] if you need to cancel or reschedule your appointment.   If you are experiencing a Mental Health or [place] or need someone to talk to, please call the [facility]: [phone]   Patient verbalizes understanding of instructions and care plan provided today and agrees to view in [name]. Active MyChart status and patient understanding of how to access instructions and care plan via MyChart confirmed with patient.      Susan Davidson, [name] [phone]

## 2022-07-05 NOTE — Patient Outreach (Signed)
  Care Coordination   Initial Visit Note   07/05/2022 Name: Susan Davidson MRN: [id number] DOB: June 06, 1951  Susan Davidson is a 71 y.o. year old female who sees Biagio Borg, MD for primary care. I spoke with  [name] 's husband by phone today.  What matters to the patients health and wellness today?  Caregiver Support    Goals Addressed             This Visit's Progress    Care Coordination Activities       Care Coordination Interventions: Assessed Social Determinants of Health Made referral to RN Care Coordinator : with [facility] Emotional Support Provided Caregiver stress acknowledged  Discussed caregiver resources and support: provided Respite resources below ADL's: Community Alternative Program  (CAP) Currently has a provider 8 hours for 6 days per week Solution-Focused Strategies employed:  Problem [place] strategies reviewed   [place] offers respite, or a temporary stay. Respite care residents enjoy all the same activities, amenities and care services as our long-term residents, just for a short period of time. Whether a caregiver needs to travel, the resident needs to recover from a medical procedure, or you just want to 'test the waters', we're here when you need [place]. Our family of staff assist with as many daily living activities as needed such as bathing, dressing and medication management, and we offer therapy, pharmacy and provider services onsite. [facility] creates a safe and secure home-away-from-home where we are fully dedicated to creating the best life possible for our short-term residents. DAILY RESPITE CARE RATES START AT $125 Our all-inclusive pricing model means you are never surprised by additional costs for care, amenities, or activities. We believe in transparency and providing the best life to all residents. https://[email or web address]/      give [place] a call: [phone]   [facility] facility offers temporary 24-hour care, giving both the caregiver and the loved one a break from the daily routine. For a few days to a few weeks, you can know that your loved one is cared for in a safe, comfortable environment surrounded by people who offer personal care and companionship. [place], [place] [address]  [email or web address]     [phone]   [facility] facility offers temporary 24-hour care, giving both the caregiver and the loved one a break from the daily routine. For a few days to a few weeks, you can know that your loved one is cared for in a safe, comfortable environment surrounded by people who offer personal care and companionship. [address]  [place], [place] [address]  [email or web address]  [phone]          SDOH assessments and interventions completed:  Yes  SDOH Interventions Today    Flowsheet Row Most Recent Value  SDOH Interventions   Food Insecurity Interventions Intervention Not Indicated  Housing Interventions Intervention Not Indicated  Transportation Interventions Intervention Not Indicated  Utilities Interventions Intervention Not Indicated  Stress Interventions Intervention Not Indicated       Care Coordination Interventions Activated:  Yes  Care Coordination Interventions:  Yes, provided   Follow up plan: Follow up call scheduled for 08/02/22    Encounter Outcome:  Pt. Visit Completed   Casimer Lanius, [place] [phone]

## 2022-07-08 ENCOUNTER — Ambulatory Visit (INDEPENDENT_AMBULATORY_CARE_PROVIDER_SITE_OTHER): Payer: Medicare HMO | Admitting: Internal Medicine

## 2022-07-08 ENCOUNTER — Encounter: Payer: Self-pay | Admitting: Internal Medicine

## 2022-07-08 VITALS — BP 126/62 | HR 73 | Temp 97.6°F | Ht 62.0 in | Wt 117.0 lb

## 2022-07-08 DIAGNOSIS — N1831 Chronic kidney disease, stage 3a: Secondary | ICD-10-CM | POA: Diagnosis not present

## 2022-07-08 DIAGNOSIS — Z8673 Personal history of transient ischemic attack (TIA), and cerebral infarction without residual deficits: Secondary | ICD-10-CM | POA: Diagnosis not present

## 2022-07-08 DIAGNOSIS — R7302 Impaired glucose tolerance (oral): Secondary | ICD-10-CM

## 2022-07-08 DIAGNOSIS — I10 Essential (primary) hypertension: Secondary | ICD-10-CM

## 2022-07-08 DIAGNOSIS — E782 Mixed hyperlipidemia: Secondary | ICD-10-CM | POA: Diagnosis not present

## 2022-07-08 DIAGNOSIS — L609 Nail disorder, unspecified: Secondary | ICD-10-CM

## 2022-07-08 NOTE — Assessment & Plan Note (Signed)
Lab Results  Component Value Date   LDLCALC 120 (H) 01/06/2022   Uncontrolled, now taking zetia 10 mg , pt to continue current statin crestor 5 mg qd, delcines f/u lab for now

## 2022-07-08 NOTE — Patient Instructions (Addendum)
Please continue all other medications as before, and refills have been done if requested.  Please have the pharmacy call with any other refills you may need.  Please continue your efforts at being more active, low cholesterol diet, and weight control.  You are otherwise up to date with prevention measures today.  Please keep your appointments with your specialists as you may have planned  You will be contacted regarding the referral for: podiatry  Please make an Appointment to return in 6 months, or sooner if needed

## 2022-07-08 NOTE — Assessment & Plan Note (Signed)
With left leg weakness, remains wheelchair bound, pt to finish PT next wk

## 2022-07-08 NOTE — Assessment & Plan Note (Signed)
Worsening, now willing for podiatry - will refer

## 2022-07-08 NOTE — Assessment & Plan Note (Signed)
Lab Results  Component Value Date   CREATININE 0.84 06/07/2022   Stable overall, cont to avoid nephrotoxins, f/u lab next visit

## 2022-07-08 NOTE — Progress Notes (Signed)
Patient ID: Susan Davidson, female   DOB: 1951-07-16, 71 y.o.   MRN: [id number]        Chief Complaint: follow up hx of stroke, toenail disorder, htn        HPI:  Susan Davidson is a 71 y.o. female here with son overall doing ok, but bundled up today in the wheelchair c/o being cold.  Denies o/w feeling ill, denies fever, chills.  Pt denies chest pain, increased sob or doe, wheezing, orthopnea, PND, increased LE swelling, palpitations, dizziness or syncope.   Pt denies polydipsia, polyuria, or new focal neuro s/s.   Denies urinary symptoms such as dysuria, frequency, urgency, flank pain, hematuria or n/v, fever, chills.  Does have miultiple nails quite long, needs podiatry referral, did not attend after last referral jan 2023.  Pt not able to stand it seems, and PT to finish up next wk.        Wt Readings from Last 3 Encounters:  07/08/22 117 lb (53.1 kg)  06/15/22 117 lb (53.1 kg)  06/07/22 117 lb (53.1 kg)   BP Readings from Last 3 Encounters:  07/08/22 126/62  06/15/22 117/71  06/07/22 (!) 177/75         Past Medical History:  Diagnosis Date   ANXIETY 02/12/2008   Qualifier: Diagnosis of  By: Jenny Reichmann MD, Hunt Oris    CVA (cerebral vascular accident) [facility])    DEPRESSION 02/12/2008   Qualifier: Diagnosis of  By: Elveria Royals    HYPERLIPIDEMIA 02/12/2008   Qualifier: Diagnosis of  By: Elveria Royals    HYPERTENSION 02/12/2008   Qualifier: Diagnosis of  By: Sherwood, Ali Chuk 02/12/2008   Qualifier: Diagnosis of  By: Elveria Royals    Impaired glucose tolerance 08/27/2011   Left hemiparesis ([place])    OSTEOPENIA 02/12/2008   Qualifier: Diagnosis of  By: Jenny Reichmann MD, Hunt Oris    VITAMIN D DEFICIENCY 04/14/2010   Qualifier: Diagnosis of  By: Jenny Reichmann MD, Hunt Oris    Past Surgical History:  Procedure Laterality Date   IR RADIOLOGIST EVAL & MGMT  08/18/2021    reports that she quit smoking about 17 months ago. Her smoking use included  cigarettes. She has never used smokeless tobacco. She reports that she does not currently use alcohol. She reports that she does not use drugs. family history includes Bipolar disorder in her sister; Heart disease in her father. 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 12 tablet 2   ALPRAZolam (XANAX) 0.5 MG tablet Take 1 tablet (0.5 mg total) by mouth 2 (two) times daily as needed for anxiety. 60 tablet 2   ascorbic acid (VITAMIN C) 500 MG tablet Take 1 tablet (500 mg total) by mouth daily. 30 tablet 2   aspirin EC 81 MG tablet Take 1 tablet (81 mg total) by mouth daily with breakfast. Swallow whole. 30 tablet 11   clopidogrel (PLAVIX) 75 MG tablet Take 1 tablet (75 mg total) by mouth daily. 90 tablet 3   divalproex (  DEPAKOTE) 250 MG DR tablet Take 1 tablet by mouth See admin instructions. 1 tablet in the morning and 2 tablets every evening     EPINEPHrine 0.3 mg/0.3 mL IJ SOAJ injection Inject 0.3 mg into the muscle as needed for anaphylaxis. 1 each 2   levothyroxine (SYNTHROID) 88 MCG tablet Take 1 tablet (88 mcg total) by mouth daily. 90 tablet 1   melatonin 5 MG TABS Take 5 mg by mouth.     memantine (NAMENDA) 5 MG tablet Take 1 tablet (5 mg total) by mouth 2 (two) times daily. 180 tablet 3   Multiple Vitamin (MULTIVITAMIN) tablet Take 1 tablet by mouth daily.     OVER THE COUNTER MEDICATION Take 1 tablet by mouth daily. magnesium     PARoxetine (PAXIL) 40 MG tablet Take 40 mg by mouth every morning.     polyethylene glycol (MIRALAX / GLYCOLAX) 17 g packet Take 17 g by mouth daily as needed for mild  constipation. 14 each 0   rosuvastatin (CRESTOR) 5 MG tablet Take 1 tablet (5 mg total) by mouth daily. Hold Crestor while taking Paxlovid 30 tablet 4   No current facility-administered medications on file prior to visit.        ROS:  All others reviewed and negative.  Objective        PE:  BP 126/62 (BP Location: Right Arm, Patient Position: Sitting, Cuff Size: Large)   Pulse 73   Temp 97.6 F (36.4 C) (Oral)   Ht 5\' 2"  (1.575 m)   Wt 117 lb (53.1 kg)   SpO2 92%   BMI 21.40 kg/m                 Constitutional: Pt appears in NAD               HENT: Head: NCAT.                Right Ear: External ear normal.                 Left Ear: External ear normal.                Eyes: . Pupils are equal, round, and reactive to light. Conjunctivae and EOM are normal               Nose: without d/c or deformity               Neck: Neck supple. Gross normal ROM               Cardiovascular: Normal rate and regular rhythm.                 Pulmonary/Chest: Effort normal and breath sounds without rales or wheezing.                Abd:  Soft, NT, ND, + BS, no organomegaly               Neurological: Pt is alert. At baseline orientation, motor grossly intact               Skin: Skin is warm. No rashes, no other new lesions, LE edema - none, but has bilateral all nails long and disfigured               Psychiatric: Pt behavior is normal without agitation   Micro: none  Cardiac tracings I have personally interpreted today:  none  Pertinent Radiological findings (summarize): none   Lab  Results  Component Value Date   WBC 10.0 06/07/2022   HGB 14.7 06/07/2022   HCT 45.5 06/07/2022   PLT 293 06/07/2022   GLUCOSE 98 06/07/2022   CHOL 214 (H) 01/06/2022   TRIG 194.0 (H) 01/06/2022   HDL 54.60 01/06/2022   LDLDIRECT 137.5 10/19/2012   LDLCALC 120 (H) 01/06/2022   ALT 12 01/21/2022   AST 14 (L) 01/21/2022   NA 143 06/07/2022   K 4.6 06/07/2022   CL 103 06/07/2022   CREATININE 0.84  06/07/2022   BUN 29 (H) 06/07/2022   CO2 31 06/07/2022   TSH 2.270 05/30/2022   INR 0.9 02/01/2021   HGBA1C 6.1 01/06/2022   Assessment/Plan:  Susan Davidson is a 71 y.o. White or Caucasian [1] female with  has a past medical history of ANXIETY (02/12/2008), CVA (cerebral vascular accident) ([place]), DEPRESSION (02/12/2008), HYPERLIPIDEMIA (02/12/2008), HYPERTENSION (02/12/2008), HYPOTHYROIDISM (02/12/2008), Impaired glucose tolerance (08/27/2011), Left hemiparesis ([place]), OSTEOPENIA (02/12/2008), and VITAMIN D DEFICIENCY (04/14/2010).  Impaired glucose tolerance Lab Results  Component Value Date   HGBA1C 6.1 01/06/2022   Stable, pt to continue current medical treatment  - diet, wt control   HLD (hyperlipidemia) Lab Results  Component Value Date   LDLCALC 120 (H) 01/06/2022   Uncontrolled, now taking zetia 10 mg , pt to continue current statin crestor 5 mg qd, delcines f/u lab for now   Essential hypertension BP Readings from Last 3 Encounters:  07/08/22 126/62  06/15/22 117/71  06/07/22 (!) 177/75   Stable, pt to continue medical treatment  - diet, wt control, low salt   CKD (chronic kidney disease) Lab Results  Component Value Date   CREATININE 0.84 06/07/2022   Stable overall, cont to avoid nephrotoxins, f/u lab next visit  Nail disorder Worsening, now willing for podiatry - will refer  History of stroke With left leg weakness, remains wheelchair bound, pt to finish PT next wk  Followup: Return in about 6 months (around 01/07/2023).  Cathlean Cower, MD 07/08/2022 11:52 AM [facility]

## 2022-07-08 NOTE — Assessment & Plan Note (Signed)
Lab Results  Component Value Date   HGBA1C 6.1 01/06/2022   Stable, pt to continue current medical treatment  - diet, wt control

## 2022-07-08 NOTE — Assessment & Plan Note (Signed)
BP Readings from Last 3 Encounters:  07/08/22 126/62  06/15/22 117/71  06/07/22 (!) 177/75   Stable, pt to continue medical treatment  - diet, wt control, low salt

## 2022-07-20 ENCOUNTER — Ambulatory Visit (INDEPENDENT_AMBULATORY_CARE_PROVIDER_SITE_OTHER): Payer: Medicare HMO | Admitting: Podiatry

## 2022-07-20 ENCOUNTER — Encounter: Payer: Self-pay | Admitting: Podiatry

## 2022-07-20 ENCOUNTER — Other Ambulatory Visit: Payer: Self-pay | Admitting: Internal Medicine

## 2022-07-20 DIAGNOSIS — M79674 Pain in right toe(s): Secondary | ICD-10-CM | POA: Diagnosis not present

## 2022-07-20 DIAGNOSIS — B351 Tinea unguium: Secondary | ICD-10-CM | POA: Diagnosis not present

## 2022-07-20 DIAGNOSIS — M79675 Pain in left toe(s): Secondary | ICD-10-CM | POA: Diagnosis not present

## 2022-07-20 NOTE — Telephone Encounter (Signed)
Please refill as per office routine med refill policy (all routine meds to be refilled for 3 mo or monthly (per pt preference) up to one year from last visit, then month to month grace period for 3 mo, then further med refills will have to be denied) ? ?

## 2022-07-21 NOTE — Progress Notes (Signed)
Subjective:   Patient ID: Susan Davidson, female   DOB: 71 y.o.   MRN: [id number]   HPI Patient has developed nail fungus on her big toenails and then on the outside of her nails also.  States they get sore and make it hard to walk and she presents with caregiver in a wheelchair does not smoke is not active   Review of Systems  All other systems reviewed and are negative.       Objective:  Physical Exam Vitals and nursing note reviewed.  Constitutional:      Appearance: She is well-developed.  Pulmonary:     Effort: Pulmonary effort is normal.  Musculoskeletal:        General: Normal range of motion.  Skin:    General: Skin is warm.  Neurological:     Mental Status: She is alert.     Vascular status found to be diminished bilateral with patient found to have very poor muscle strength or range of motion.  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

## 2022-08-02 ENCOUNTER — Ambulatory Visit: Payer: Self-pay | Admitting: Licensed Clinical Social Worker

## 2022-08-02 NOTE — Patient Instructions (Signed)
Visit Information  Thank you for taking time to visit with me today. Please don't hesitate to contact me if I can be of assistance to you.   Following are the goals we discussed today:   Goals Addressed             This Visit's Progress    COMPLETED: Care Giver Support, No Follow up Required       Care Coordination Interventions: Assessed Social Determinants of Health Made referral to RN Care Coordinator : with [facility] ; they have spoken with her Emotional Support Provided Caregiver stress acknowledged  Discussed caregiver resources and support: Reviewed Respite resources below  and sent via e-mail ADL's: [facility]  (CAP) Currently has a provider 8 hours for 6 days per week Solution-Focused Strategies employed:  Problem [place] strategies reviewed   [facility] offers respite, or a temporary stay. Respite care residents enjoy all the same activities, amenities and care services as our long-term residents, just for a short period of time. Whether a caregiver needs to travel, the resident needs to recover from a medical procedure, or you just want to 'test the waters', we're here when you need [place]. Our family of staff assist with as many daily living activities as needed such as bathing, dressing and medication management, and we offer therapy, pharmacy and provider services onsite. [facility] creates a safe and secure home-away-from-home where we are fully dedicated to creating the best life possible for our short-term residents. DAILY RESPITE CARE RATES START AT $125 Our all-inclusive pricing model means you are never surprised by additional costs for care, amenities, or activities. We believe in transparency and providing the best life to all residents. https://[email or web address]/      give [place] a call: [phone]   [facility] facility offers temporary 24-hour care, giving both the caregiver and  the loved one a break from the daily routine. For a few days to a few weeks, you can know that your loved one is cared for in a safe, comfortable environment surrounded by people who offer personal care and companionship. [place], [place] [address]  [email or web address]     [phone]   [facility] facility offers temporary 24-hour care, giving both the caregiver and the loved one a break from the daily routine. For a few days to a few weeks, you can know that your loved one is cared for in a safe, comfortable environment surrounded by people who offer personal care and companionship. [address]  [place], [place] [address]  [email or web address]  [phone]           Patient verbalizes understanding of instructions and care plan provided today and agrees to view in [place]. Active MyChart status and patient understanding of how to access instructions and care plan via MyChart confirmed with patient.     No further follow up required: [facility] team works in collaboration with your primary care doctor.  Please call [phone] if you would like to schedule a phone appointment   1.The Care Coordination services include support from the care team which includes a Nurse Coordinator, Clinical Social Worker, or Pharmacist, to assist with navigating your physical and mental health needs. [name] Kitchen  2.The Care Coordination team is here to help remove barriers to health concerns and goals most important to you.  3.Care Coordination services are voluntary, you may decline or stop  services at any time by request to the care team member    Susan Davidson, [place] [phone]

## 2022-08-02 NOTE — Patient Outreach (Signed)
Care Coordination  Follow Up Visit Note   08/02/2022 Name: Susan Davidson MRN: [id number] DOB: 10-22-1950  Susan Davidson is a 71 y.o. year old female who sees Biagio Borg, MD for primary care. I spoke with  Susan Davidson's husband by phone today.  What matters to the patients health and wellness today?  Caregiver Resources  Patient's husband Susan Davidson provided all information during this encounter. He is making progress and has several options for additional home support .   Recommendation: Patient may benefit from, and husband is in agreement to Follow up on resources provided by LCSW below and resources provided by [facility] .    Goals Addressed             This Visit's Progress    COMPLETED: Care Giver Support, No Follow up Required       Care Coordination Interventions: Assessed Social Determinants of Health Made referral to RN Care Coordinator : with [facility] ; they have spoken with her Emotional Support Provided Caregiver stress acknowledged  Discussed caregiver resources and support: Reviewed Respite resources below  and sent via e-mail ADL's: [facility]  (CAP) Currently has a provider 8 hours for 6 days per week Solution-Focused Strategies employed:  Problem [place] strategies reviewed   [place] offers respite, or a temporary stay. Respite care residents enjoy all the same activities, amenities and care services as our long-term residents, just for a short period of time. Whether a caregiver needs to travel, the resident needs to recover from a medical procedure, or you just want to 'test the waters', we're here when you need [place]. Our family of staff assist with as many daily living activities as needed such as bathing, dressing and medication management, and we offer therapy, pharmacy and provider services onsite. [facility] creates a safe and secure home-away-from-home where we are fully  dedicated to creating the best life possible for our short-term residents. DAILY RESPITE CARE RATES START AT $125 Our all-inclusive pricing model means you are never surprised by additional costs for care, amenities, or activities. We believe in transparency and providing the best life to all residents. https://[email or web address]/      give [place] a call: [phone]   [facility] facility offers temporary 24-hour care, giving both the caregiver and the loved one a break from the daily routine. For a few days to a few weeks, you can know that your loved one is cared for in a safe, comfortable environment surrounded by people who offer personal care and companionship. [place], [place] [address]  [email or web address]     ([phone]   [facility] facility offers temporary 24-hour care, giving both the caregiver and the loved one a break from the daily routine. For a few days to a few weeks, you can know that your loved one is cared for in a safe, comfortable environment surrounded by people who offer personal care and companionship. [address]  [place], [place] [address]  [email or web address]  [phone]           SDOH assessments and interventions completed:  No    Care Coordination Interventions Activated:  Yes  Care Coordination Interventions:  Yes, provided   Follow up plan: No further intervention required. Husband will reach out to LCSW if needed.  Encounter Outcome:  Pt. Visit Completed   Casimer Lanius, [place] /  [place] [phone]

## 2022-08-16 DIAGNOSIS — F429 Obsessive-compulsive disorder, unspecified: Secondary | ICD-10-CM | POA: Diagnosis not present

## 2022-08-16 DIAGNOSIS — F3181 Bipolar II disorder: Secondary | ICD-10-CM | POA: Diagnosis not present

## 2022-08-30 ENCOUNTER — Telehealth: Payer: Self-pay | Admitting: Internal Medicine

## 2022-08-30 NOTE — Telephone Encounter (Signed)
Patient needs to get her alprazolam refilled - please send to [facility] on [address]

## 2022-09-01 MED ORDER — ALPRAZOLAM 0.5 MG PO TABS: 0.5000 mg | ORAL_TABLET | Freq: Two times a day (BID) | ORAL | 2 refills | Status: DC | PRN

## 2022-09-01 NOTE — Telephone Encounter (Signed)
Ok done erx 

## 2022-09-01 NOTE — Telephone Encounter (Signed)
See below

## 2022-09-19 DIAGNOSIS — R2689 Other abnormalities of gait and mobility: Secondary | ICD-10-CM | POA: Diagnosis not present

## 2022-09-19 DIAGNOSIS — I720 Aneurysm of carotid artery: Secondary | ICD-10-CM | POA: Diagnosis not present

## 2022-09-19 DIAGNOSIS — Z79899 Other long term (current) drug therapy: Secondary | ICD-10-CM | POA: Diagnosis not present

## 2022-09-19 DIAGNOSIS — I69354 Hemiplegia and hemiparesis following cerebral infarction affecting left non-dominant side: Secondary | ICD-10-CM | POA: Diagnosis not present

## 2022-10-10 ENCOUNTER — Encounter: Payer: Self-pay | Admitting: Internal Medicine

## 2022-10-10 ENCOUNTER — Ambulatory Visit (INDEPENDENT_AMBULATORY_CARE_PROVIDER_SITE_OTHER): Payer: Medicare HMO | Admitting: Internal Medicine

## 2022-10-10 VITALS — BP 128/62 | HR 80 | Temp 97.7°F | Ht 62.0 in | Wt 130.0 lb

## 2022-10-10 DIAGNOSIS — E782 Mixed hyperlipidemia: Secondary | ICD-10-CM | POA: Diagnosis not present

## 2022-10-10 DIAGNOSIS — E559 Vitamin D deficiency, unspecified: Secondary | ICD-10-CM

## 2022-10-10 DIAGNOSIS — E538 Deficiency of other specified B group vitamins: Secondary | ICD-10-CM

## 2022-10-10 DIAGNOSIS — R7302 Impaired glucose tolerance (oral): Secondary | ICD-10-CM | POA: Diagnosis not present

## 2022-10-10 DIAGNOSIS — Z0001 Encounter for general adult medical examination with abnormal findings: Secondary | ICD-10-CM | POA: Diagnosis not present

## 2022-10-10 DIAGNOSIS — N1831 Chronic kidney disease, stage 3a: Secondary | ICD-10-CM | POA: Diagnosis not present

## 2022-10-10 DIAGNOSIS — I10 Essential (primary) hypertension: Secondary | ICD-10-CM

## 2022-10-10 NOTE — Assessment & Plan Note (Signed)
Last vitamin D Lab Results  Component Value Date   VD25OH 46.54 01/06/2022   Stable, cont oral replacement

## 2022-10-10 NOTE — Patient Instructions (Signed)
Please continue all other medications as before, and refills have been done if requested.  Please have the pharmacy call with any other refills you may need.  Please continue your efforts at being more active, low cholesterol diet, and weight control.  You are otherwise up to date with prevention measures today.  Please keep your appointments with your specialists as you may have planned  We can hold on further lab testing today  Please make an Appointment to return in 6 months, or sooner if needed

## 2022-10-10 NOTE — Progress Notes (Signed)
Patient ID: Susan Davidson, female   DOB: May 10, 1951, 72 y.o.   MRN: [id number]         Chief Complaint:: wellness exam        HPI:  Susan Davidson is a 72 y.o. female here for wellness exam; declines all immunizations, dxa, colonoscopy, or mammogram. O/w up to date                        Also has seen neurology with elevated BP, /fu here has been ok.  Pt denies chest pain, increased sob or doe, wheezing, orthopnea, PND, increased LE swelling, palpitations, dizziness or syncope.   Pt denies polydipsia, polyuria, or new focal neuro s/s.    Pt denies fever, wt loss, night sweats, loss of appetite, or other constitutional symptoms     Wt Readings from Last 3 Encounters:  10/10/22 130 lb (59 kg)  07/08/22 117 lb (53.1 kg)  06/15/22 117 lb (53.1 kg)   BP Readings from Last 3 Encounters:  10/10/22 128/62  07/08/22 126/62  06/15/22 117/71   Immunization History  Administered Date(s) Administered   Influenza-Unspecified 08/18/2021   Moderna Sars-Covid-2 Vaccination 12/02/2019   Tdap 10/19/2012  There are no preventive care reminders to display for this patient.    Past Medical History:  Diagnosis Date   ANXIETY 02/12/2008   Qualifier: Diagnosis of  By: Jenny Reichmann MD, Hunt Oris    CVA (cerebral vascular accident) [facility])    DEPRESSION 02/12/2008   Qualifier: Diagnosis of  By: Elveria Royals    HYPERLIPIDEMIA 02/12/2008   Qualifier: Diagnosis of  By: Elveria Royals    HYPERTENSION 02/12/2008   Qualifier: Diagnosis of  By: Elveria Royals    HYPOTHYROIDISM 02/12/2008   Qualifier: Diagnosis of  By: Elveria Royals    Impaired glucose tolerance 08/27/2011   Left hemiparesis ([place])    OSTEOPENIA 02/12/2008   Qualifier: Diagnosis of  By: Jenny Reichmann MD, Hunt Oris    VITAMIN D DEFICIENCY 04/14/2010   Qualifier: Diagnosis of  By: Jenny Reichmann MD, Hunt Oris    Past Surgical History:  Procedure Laterality Date   IR RADIOLOGIST EVAL & MGMT  08/18/2021    reports that she quit  smoking about 20 months ago. Her smoking use included cigarettes. She has never used smokeless tobacco. She reports that she does not currently use alcohol. She reports that she does not use drugs. family history includes Bipolar disorder in her sister; Heart disease in her father. 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 12 tablet 2   ALPRAZolam (XANAX) 0.5 MG tablet Take 1 tablet (0.5 mg total) by mouth 2 (two) times daily as needed for anxiety. 60 tablet 2   ascorbic acid (VITAMIN C) 500 MG tablet Take 1 tablet (500 mg total) by mouth daily. 30 tablet 2   aspirin EC 81 MG tablet Take 1 tablet (81 mg total) by mouth daily with breakfast. Swallow whole. 30 tablet 11   clopidogrel (PLAVIX) 75 MG tablet Take 1  tablet (75 mg total) by mouth daily. 90 tablet 3   divalproex (DEPAKOTE) 250 MG DR tablet Take 1 tablet by mouth See admin instructions. 1 tablet in the morning and 2 tablets every evening     EPINEPHrine 0.3 mg/0.3 mL IJ SOAJ injection Inject 0.3 mg into the muscle as needed for anaphylaxis. 1 each 2   levothyroxine (SYNTHROID) 88 MCG tablet Take 1 tablet (88 mcg total) by mouth daily. 90 tablet 1   melatonin 5 MG TABS Take 5 mg by mouth.     memantine (NAMENDA) 5 MG tablet Take 1 tablet (5 mg total) by mouth 2 (two) times daily. 180 tablet 3   Multiple Vitamin (MULTIVITAMIN) tablet Take 1 tablet by mouth daily.     OVER THE COUNTER MEDICATION Take 1 tablet by mouth daily. magnesium     PARoxetine (PAXIL) 40 MG tablet Take 40 mg by mouth every morning.     polyethylene glycol (MIRALAX / GLYCOLAX) 17 g  packet Take 17 g by mouth daily as needed for mild constipation. 14 each 0   rosuvastatin (CRESTOR) 5 MG tablet TAKE 1 TABLET BY MOUTH DAILY. HOLD CRESTOR WHILE TAKING PAXLOVID 30 tablet 4   No current facility-administered medications on file prior to visit.        ROS:  All others reviewed and negative.  Objective        PE:  BP 128/62 (BP Location: Right Arm, Patient Position: Sitting, Cuff Size: Large)   Pulse 80   Temp 97.7 F (36.5 C) (Oral)   Ht 5\' 2"  (1.575 m)   Wt 130 lb (59 kg)   SpO2 90%   BMI 23.78 kg/m                 Constitutional: Pt appears in NAD               HENT: Head: NCAT.                Right Ear: External ear normal.                 Left Ear: External ear normal.                Eyes: . Pupils are equal, round, and reactive to light. Conjunctivae and EOM are normal               Nose: without d/c or deformity               Neck: Neck supple. Gross normal ROM               Cardiovascular: Normal rate and regular rhythm.                 Pulmonary/Chest: Effort normal and breath sounds without rales or wheezing.                Abd:  Soft, NT, ND, + BS, no organomegaly               Neurological: Pt is alert. At baseline orientation, motor grossly intact               Skin: Skin is warm. No rashes, no other new lesions, LE edema - none               Psychiatric: Pt behavior is normal without agitation   Micro: none  Cardiac tracings I have personally interpreted today:  none  Pertinent Radiological findings (summarize): none  Lab Results  Component Value Date   WBC 10.0 06/07/2022   HGB 14.7 06/07/2022   HCT 45.5 06/07/2022   PLT 293 06/07/2022   GLUCOSE 98 06/07/2022   CHOL 214 (H) 01/06/2022   TRIG 194.0 (H) 01/06/2022   HDL 54.60 01/06/2022   LDLDIRECT 137.5 10/19/2012   LDLCALC 120 (H) 01/06/2022   ALT 12 01/21/2022   AST 14 (L) 01/21/2022   NA 143 06/07/2022   K 4.6 06/07/2022   CL 103 06/07/2022   CREATININE 0.84 06/07/2022   BUN 29  (H) 06/07/2022   CO2 31 06/07/2022   TSH 2.270 05/30/2022   INR 0.9 02/01/2021   HGBA1C 6.1 01/06/2022   Assessment/Plan:  [name] is a 72 y.o. White or Caucasian [1] female with  has a past medical history of ANXIETY (02/12/2008), CVA (cerebral vascular accident) ([place]), DEPRESSION (02/12/2008), HYPERLIPIDEMIA (02/12/2008), HYPERTENSION (02/12/2008), HYPOTHYROIDISM (02/12/2008), Impaired glucose tolerance (08/27/2011), Left hemiparesis ([place]), OSTEOPENIA (02/12/2008), and VITAMIN D DEFICIENCY (04/14/2010).  Vitamin D deficiency Last vitamin D Lab Results  Component Value Date   VD25OH 46.54 01/06/2022   Stable, cont oral replacement   Encounter for well adult exam with abnormal findings Age and sex appropriate education and counseling updated with regular exercise and diet Referrals for preventative services - declines GYN referral, or colonoscopy Immunizations addressed - declines all Smoking counseling  - none needed Evidence for depression or other mood disorder - chronic anxiety, depression -  stable, cont follow with psychiatry Most recent labs review I have personally reviewed and have noted: 1) the patient's medical and social history 2) The patient's current medications and supplements 3) The patient's height, weight, and BMI have been recorded in the chart   B12 deficiency Lab Results  Component Value Date   VITAMINB12 >1504 (H) 01/06/2022   Stable, cont oral replacement - b12 1000 mcg qd   CKD (chronic kidney disease) Lab Results  Component Value Date   CREATININE 0.84 06/07/2022   Stable overall, cont to avoid nephrotoxins   Essential hypertension BP Readings from Last 3 Encounters:  10/10/22 128/62  07/08/22 126/62  06/15/22 117/71   Stable, pt to continue medical treatment  - diet, wt control   HLD (hyperlipidemia) Lab Results  Component Value Date   LDLCALC 120 (H) 01/06/2022   Uncontrolled,  pt to continue current statin crestor 5 mg  qd, declines furthier increase for now   Impaired glucose tolerance Lab Results  Component Value Date   HGBA1C 6.1 01/06/2022   Stable, pt to continue current medical treatment  -  diet, wt control  Followup: Return in about 6 months (around 04/10/2023).  Cathlean Cower, MD 10/10/2022 9:52 PM [facility]

## 2022-10-10 NOTE — Assessment & Plan Note (Signed)
BP Readings from Last 3 Encounters:  10/10/22 128/62  07/08/22 126/62  06/15/22 117/71   Stable, pt to continue medical treatment  - diet, wt control

## 2022-10-10 NOTE — Assessment & Plan Note (Signed)
Lab Results  Component Value Date   VITAMINB12 >1504 (H) 01/06/2022   Stable, cont oral replacement - b12 1000 mcg qd

## 2022-10-10 NOTE — Assessment & Plan Note (Signed)
Lab Results  Component Value Date   CREATININE 0.84 06/07/2022   Stable overall, cont to avoid nephrotoxins

## 2022-10-10 NOTE — Assessment & Plan Note (Signed)
Lab Results  Component Value Date   HGBA1C 6.1 01/06/2022   Stable, pt to continue current medical treatment  -  diet, wt control

## 2022-10-10 NOTE — Assessment & Plan Note (Signed)
Lab Results  Component Value Date   LDLCALC 120 (H) 01/06/2022   Uncontrolled,  pt to continue current statin crestor 5 mg qd, declines furthier increase for now

## 2022-10-10 NOTE — Assessment & Plan Note (Signed)
Age and sex appropriate education and counseling updated with regular exercise and diet Referrals for preventative services - declines GYN referral, or colonoscopy Immunizations addressed - declines all Smoking counseling  - none needed Evidence for depression or other mood disorder - chronic anxiety, depression -  stable, cont follow with psychiatry Most recent labs review I have personally reviewed and have noted: 1) the patient's medical and social history 2) The patient's current medications and supplements 3) The patient's height, weight, and BMI have been recorded in the chart

## 2022-11-15 DIAGNOSIS — F3181 Bipolar II disorder: Secondary | ICD-10-CM | POA: Diagnosis not present

## 2022-11-15 DIAGNOSIS — F429 Obsessive-compulsive disorder, unspecified: Secondary | ICD-10-CM | POA: Diagnosis not present

## 2022-11-23 ENCOUNTER — Encounter: Payer: Self-pay | Admitting: Podiatry

## 2022-11-23 ENCOUNTER — Ambulatory Visit (INDEPENDENT_AMBULATORY_CARE_PROVIDER_SITE_OTHER): Payer: Medicare HMO | Admitting: Podiatry

## 2022-11-23 DIAGNOSIS — M79674 Pain in right toe(s): Secondary | ICD-10-CM | POA: Diagnosis not present

## 2022-11-23 DIAGNOSIS — B351 Tinea unguium: Secondary | ICD-10-CM

## 2022-11-23 DIAGNOSIS — M79675 Pain in left toe(s): Secondary | ICD-10-CM

## 2022-11-23 NOTE — Progress Notes (Signed)
This patient presents to the office with chief complaint of long thick painful nails.  Patient says the nails are painful walking and wearing shoes.  This patient is unable to self treat.  This patient is unable to trim her nails since she is unable to reach her nails.  She present to the office in a wheelchair accompanied by female caregiver. She presents to the office for preventative foot care services.  General Appearance  Alert, conversant and in no acute stress.  Vascular  Dorsalis pedis and posterior tibial  pulses are weakly  palpable  bilaterally.  Capillary return is within normal limits  bilaterally. Temperature is within normal limits  bilaterally. Purplish feet  B/L.  Neurologic  Senn-Weinstein monofilament wire test within normal limits  bilaterally. Muscle power within normal limits bilaterally.  Nails Thick disfigured discolored nails with subungual debris  from hallux to fifth toes bilaterally. No evidence of bacterial infection or drainage bilaterally.  Orthopedic  No limitations of motion  feet .  No crepitus or effusions noted.  No bony pathology or digital deformities noted.  Skin  normotropic skin with no porokeratosis noted bilaterally.  No signs of infections or ulcers noted.     Onychomycosis  Nails  B/L.  Pain in right toes  Pain in left toes  Debridement of nails both feet followed trimming the nails with dremel tool.    RTC 4  months.   Gardiner Barefoot DPM

## 2022-11-29 ENCOUNTER — Telehealth: Payer: Self-pay | Admitting: Internal Medicine

## 2022-11-29 MED ORDER — ALPRAZOLAM 0.5 MG PO TABS: 0.5000 mg | ORAL_TABLET | Freq: Two times a day (BID) | ORAL | 2 refills | Status: DC | PRN

## 2022-11-29 NOTE — Telephone Encounter (Signed)
MEDICATION:ALPRAZolam (XANAX) 0.5 MG tablet   PHARMACY:[facility] [id number] - [place], [place] - [address] AT [address]   Comments: Patient said they contacted the pharmacy to have medication refilled.  **Let patient know to contact pharmacy at the end of the day to make sure medication is ready. **  ** Please notify patient to allow 48-72 hours to process**  **Encourage patient to contact the pharmacy for refills or they can request refills through [facility]**

## 2022-11-29 NOTE — Telephone Encounter (Signed)
Done erx 

## 2022-12-12 ENCOUNTER — Encounter: Payer: Self-pay | Admitting: Neurology

## 2022-12-12 ENCOUNTER — Ambulatory Visit (INDEPENDENT_AMBULATORY_CARE_PROVIDER_SITE_OTHER): Payer: Medicare HMO | Admitting: Neurology

## 2022-12-12 VITALS — BP 125/64 | HR 82 | Ht 62.0 in

## 2022-12-12 DIAGNOSIS — I729 Aneurysm of unspecified site: Secondary | ICD-10-CM | POA: Diagnosis not present

## 2022-12-12 DIAGNOSIS — I639 Cerebral infarction, unspecified: Secondary | ICD-10-CM | POA: Diagnosis not present

## 2022-12-12 DIAGNOSIS — R29898 Other symptoms and signs involving the musculoskeletal system: Secondary | ICD-10-CM | POA: Diagnosis not present

## 2022-12-12 NOTE — Progress Notes (Signed)
Chief Complaint  Patient presents with   New Patient (Initial Visit)    Rm 12. Accompanied by Susan Davidson. NP Paper referral for hx of stroke, parkinsons, gait abnormality TOC.      ASSESSMENT AND PLAN  Susan Davidson is a 72 y.o. 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, with residual left lower extremity more than left upper extremity weakness, Known history of anterior communicating 5 mm aneurysm  Slow worsening functional status, new development of left upper extremity fixed contraction of left elbow, left hand intrinsic muscle atrophy, weakness, abnormal neck posturing, brisk reflexes,  MRI of cervical spine to rule out cervical pathology   MRA of brain and neck  She is on double antiplatelet agent aspirin plus Plavix 75 mg, will stop aspirin, single agent Plavix alone  Return to clinic with nurse practitioner in 4 to 6 months  DIAGNOSTIC DATA (LABS, IMAGING, TESTING) - I reviewed patient records, labs, notes, testing and imaging myself where available.   MEDICAL HISTORY:  Susan Davidson, is a 72 year old female, accompanied by her husband seen in request by her primary care Dr. Cathlean Cower for evaluation of stroke, previously seen by Dr. Phillips Odor, MD, initial evaluation was with her husband on December 12, 2022  I reviewed and summarized the referring note.  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,  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, CT angiogram of head and neck confirmed 5 mm anterior communicating artery aneurysm Echocardiogram showed no significant abnormality  She had a prolonged rehabilitation stay, also multiple hospital readmission for mental status change, not eating well, COVID infection, in 2022, required prolonged nursing facility stating, now she is back home, has personal aide coming 9 AM to 6 PM, husband still go to work  She spent most of the time in wheelchair or lying down, feeling very depressed, poor appetite, sleepy, wants to follow-up for her stroke,  Over the past couple years, she also developed slow contraction of left elbow, left hands, especially left fifth fingers, neck pain, abnormal neck posturing, urinary incontinence  PHYSICAL EXAM:   Vitals:   12/12/22 1613  BP: 125/64  Pulse: 82  Height: '5\' 2"'$  (1.575 m)   Not recorded     Body mass index is 23.78 kg/m.  PHYSICAL EXAMNIATION:  Gen: NAD, conversant, well nourised, well groomed                     Cardiovascular: Regular rate rhythm, no peripheral edema, warm, nontender. Eyes: Conjunctivae clear without exudates or hemorrhage Neck: Moderate right shoulder elevation, right turn, limited range of motion of neck, Pulmonary: Clear to auscultation bilaterally   NEUROLOGICAL EXAM:  MENTAL STATUS: Speech/cognition: Depressed looking unkept elderly female, cooperative on examinations  CRANIAL NERVES: CN II: Left hemivisual field deficit,. Pupils are round equal and briskly reactive to light. CN III, IV, VI: extraocular movement are normal. No ptosis. CN V: Facial sensation is intact to light touch CN VII:  Face is symmetric with normal eye closure  CN VIII: Hearing is normal to causal conversation. CN IX, X: Phonation is normal. CN XI: Head turning and shoulder shrug are intact  MOTOR: Significant atrophy of left hand, antigravity movement of left upper extremity, weak grip on  left hand, contraction of left elbow extended maximum 150 degree, right lower extremity has antigravity movement, barely antigravity movement of left lower extremity  REFLEXES: Brisk bilateral upper and lower extremity reflex, worsening on the left side, left Babinski sign,  SENSORY: Mild length-dependent sensory changes  COORDINATION: There is no trunk or limb dysmetria noted.  GAIT/STANCE: Deferred  REVIEW OF SYSTEMS:  Full 14 system review of systems performed and notable only for as above All other review of systems were negative.   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 12 tablet 2   ALPRAZolam (XANAX) 0.5 MG tablet Take 1 tablet (0.5 mg total) by mouth 2 (two) times daily as needed for anxiety. 60 tablet 2   ascorbic acid (VITAMIN C) 500 MG tablet Take 1 tablet (500 mg total) by mouth daily. 30 tablet 2   aspirin EC 81 MG tablet Take 1 tablet (81 mg total) by mouth daily with breakfast. Swallow whole. 30 tablet 11   clopidogrel (PLAVIX) 75 MG tablet Take 1 tablet (75 mg total) by mouth daily. 90 tablet 3   divalproex (DEPAKOTE) 250 MG DR tablet Take 1 tablet by mouth See admin instructions. 1 tablet in the morning and 2 tablets every evening     EPINEPHrine 0.3 mg/0.3 mL IJ SOAJ injection Inject 0.3 mg into the muscle as needed for anaphylaxis. 1 each 2   levothyroxine (SYNTHROID) 88 MCG tablet Take 1 tablet (88 mcg total) by mouth daily. 90 tablet 1   melatonin 5 MG TABS Take 5 mg by mouth.     memantine (NAMENDA) 5 MG tablet Take 1 tablet (5 mg total) by mouth 2 (two) times daily.  180 tablet 3   Multiple Vitamin (MULTIVITAMIN) tablet Take 1 tablet by mouth daily.     OVER THE COUNTER MEDICATION Take 1 tablet by mouth daily. magnesium     PARoxetine (PAXIL) 40 MG tablet Take 40 mg by mouth every morning.     polyethylene glycol (MIRALAX / GLYCOLAX) 17 g packet Take 17 g by mouth daily as needed for mild constipation. 14 each 0   rosuvastatin (CRESTOR) 5 MG tablet TAKE 1 TABLET BY MOUTH DAILY. HOLD CRESTOR WHILE TAKING PAXLOVID 30 tablet 4   No current facility-administered medications for this visit.    PAST MEDICAL HISTORY: Past Medical History:  Diagnosis Date   ANXIETY 02/12/2008   Qualifier: Diagnosis of  By: Jenny Reichmann MD, Hunt Oris    CVA (cerebral vascular accident) [facility])    DEPRESSION 02/12/2008   Qualifier: Diagnosis of  By: Elveria Royals    HYPERLIPIDEMIA 02/12/2008   Qualifier: Diagnosis of  By: Elveria Royals    HYPERTENSION 02/12/2008   Qualifier: Diagnosis of  By: Elveria Royals    HYPOTHYROIDISM 02/12/2008  Qualifier: Diagnosis of  By: Elveria Royals    Impaired glucose tolerance 08/27/2011   Left hemiparesis ([place])    OSTEOPENIA 02/12/2008   Qualifier: Diagnosis of  By: Jenny Reichmann MD, Hunt Oris    VITAMIN D DEFICIENCY 04/14/2010   Qualifier: Diagnosis of  By: Jenny Reichmann MD, Hunt Oris     PAST SURGICAL HISTORY: Past Surgical History:  Procedure Laterality Date   IR RADIOLOGIST EVAL & MGMT  08/18/2021    FAMILY HISTORY: Family History  Problem Relation Age of Onset   Heart disease Father    Bipolar disorder Sister    Diabetes Neg Hx     SOCIAL HISTORY: Social History   Socioeconomic History   Marital status: Married    Spouse name: Not on file   Number of children: Not on file   Years of education: Not on file   Highest education level: Not on file  Occupational History   Not on file  Tobacco Use   Smoking status: Former    Types: Cigarettes    Quit date: 01/26/2021    Years since quitting: 1.8   Smokeless  tobacco: Never  Vaping Use   Vaping Use: Never used  Substance and Sexual Activity   Alcohol use: Not Currently   Drug use: No   Sexual activity: Not on file  Other Topics Concern   Not on file  Social History Narrative   Not on file   Social Determinants of Health   Financial Resource Strain: Not on file  Food Insecurity: No Food Insecurity (07/05/2022)   Hunger Vital Sign    Worried About Running Out of Food in the Last Year: Never true    Ran Out of Food in the Last Year: Never true  Transportation Needs: No Transportation Needs (07/05/2022)   PRAPARE - Hydrologist (Medical): No    Lack of Transportation (Non-Medical): No  Physical Activity: Not on file  Stress: No Stress Concern Present (07/05/2022)   [place]    Feeling of Stress : Only a little  Social Connections: Not on file  Intimate Partner Violence: Not on file      Marcial Pacas, M.D. Ph.D.  [facility] [address] [place], [place] [address] Ph: [phone] Fax: [phone]  CC:  Phillips Odor, MD [place],  [place] [address]  Biagio Borg, MD

## 2022-12-13 DIAGNOSIS — E890 Postprocedural hypothyroidism: Secondary | ICD-10-CM | POA: Diagnosis not present

## 2022-12-14 ENCOUNTER — Ambulatory Visit (INDEPENDENT_AMBULATORY_CARE_PROVIDER_SITE_OTHER): Payer: Medicare HMO | Admitting: Nurse Practitioner

## 2022-12-14 ENCOUNTER — Encounter: Payer: Self-pay | Admitting: Nurse Practitioner

## 2022-12-14 VITALS — BP 128/90 | HR 73 | Ht 62.0 in | Wt 140.0 lb

## 2022-12-14 DIAGNOSIS — E890 Postprocedural hypothyroidism: Secondary | ICD-10-CM | POA: Diagnosis not present

## 2022-12-14 LAB — T4, FREE: Free T4: 1.3 ng/dL (ref 0.82–1.77)

## 2022-12-14 LAB — TSH: TSH: 4.01 u[IU]/mL (ref 0.450–4.500)

## 2022-12-14 MED ORDER — LEVOTHYROXINE SODIUM 88 MCG PO TABS: 88.0000 ug | ORAL_TABLET | Freq: Every day | ORAL | 1 refills | Status: DC

## 2022-12-14 NOTE — Patient Instructions (Signed)

## 2022-12-14 NOTE — Progress Notes (Signed)
Endocrinology Follow Up Note                                         12/14/2022, 10:00 AM  Subjective:   Subjective    Susan Davidson is a 72 y.o.-year-old female patient being seen in follow up after being seen in consultation for hypothyroidism referred by Biagio Borg, MD.   Past Medical History:  Diagnosis Date   ANXIETY 02/12/2008   Qualifier: Diagnosis of  By: Jenny Reichmann MD, Hunt Oris    CVA (cerebral vascular accident) [facility])    DEPRESSION 02/12/2008   Qualifier: Diagnosis of  By: Elveria Royals    HYPERLIPIDEMIA 02/12/2008   Qualifier: Diagnosis of  By: Elveria Royals    HYPERTENSION 02/12/2008   Qualifier: Diagnosis of  By: [place], [place] 02/12/2008   Qualifier: Diagnosis of  By: Elveria Royals    Impaired glucose tolerance 08/27/2011   Left hemiparesis ([place])    OSTEOPENIA 02/12/2008   Qualifier: Diagnosis of  By: Jenny Reichmann MD, Hunt Oris    VITAMIN D DEFICIENCY 04/14/2010   Qualifier: Diagnosis of  By: Jenny Reichmann MD, Hunt Oris     Past Surgical History:  Procedure Laterality Date   IR RADIOLOGIST EVAL & MGMT  08/18/2021    Social History   Socioeconomic History   Marital status: Married    Spouse name: Not on file   Number of children: Not on file   Years of education: Not on file   Highest education level: Not on file  Occupational History   Not on file  Tobacco Use   Smoking status: Former    Types: Cigarettes    Quit date: 01/26/2021    Years since quitting: 1.8   Smokeless tobacco: Never  Vaping Use   Vaping Use: Never used  Substance and Sexual Activity   Alcohol use: Not Currently   Drug use: No   Sexual activity: Not on file  Other Topics Concern   Not on file  Social History Narrative   Not on file   Social Determinants of Health   Financial Resource Strain: Not on file  Food Insecurity: No Food Insecurity (07/05/2022)   Hunger Vital  Sign    Worried About Running Out of Food in the Last Year: Never true    Ran Out of Food in the Last Year: Never true  Transportation Needs: No Transportation Needs (07/05/2022)   PRAPARE - Transportation    Lack of Transportation (Medical): No    Lack of Transportation (Non-Medical): No  Physical Activity: Not on file  Stress: No Stress Concern Present (07/05/2022)   [place]    Feeling of Stress : Only a little  Social Connections: Not on file    Family History  Problem Relation Age of Onset   Heart disease Father    Bipolar disorder Sister    Diabetes Neg Hx     Outpatient Encounter Medications as  of 12/14/2022  Medication Sig   acetaminophen (TYLENOL) 325 MG tablet Take 2 tablets (650 mg total) by mouth every 6 (six) hours as needed for mild pain or headache (fever >/= 101).   ALPRAZolam (XANAX) 0.5 MG tablet Take 1 tablet (0.5 mg total) by mouth 2 (two) times daily as needed for anxiety.   ascorbic acid (VITAMIN C) 500 MG tablet Take 1 tablet (500 mg total) by mouth daily.   clopidogrel (PLAVIX) 75 MG tablet Take 1 tablet (75 mg total) by mouth daily.   divalproex (DEPAKOTE) 250 MG DR tablet Take 1 tablet by mouth See admin instructions. 1 tablet in the morning and 2 tablets every evening   EPINEPHrine 0.3 mg/0.3 mL IJ SOAJ injection Inject 0.3 mg into the muscle as needed for anaphylaxis.   melatonin 5 MG TABS Take 5 mg by mouth.   memantine (NAMENDA) 5 MG tablet Take 1 tablet (5 mg total) by mouth 2 (two) times daily.   Multiple Vitamin (MULTIVITAMIN) tablet Take 1 tablet by mouth daily.   OVER THE COUNTER MEDICATION Take 1 tablet by mouth daily. magnesium   PARoxetine (PAXIL) 40 MG tablet Take 40 mg by mouth every morning.   polyethylene glycol (MIRALAX / GLYCOLAX) 17 g packet Take 17 g by mouth daily as needed for mild constipation.   rosuvastatin (CRESTOR) 5 MG tablet TAKE 1 TABLET BY MOUTH DAILY. HOLD  CRESTOR WHILE TAKING PAXLOVID   [DISCONTINUED] levothyroxine (SYNTHROID) 88 MCG tablet Take 1 tablet (88 mcg total) by mouth daily.   aspirin EC 81 MG tablet Take 1 tablet (81 mg total) by mouth daily with breakfast. Swallow whole. (Patient not taking: Reported on 12/14/2022)   levothyroxine (SYNTHROID) 88 MCG tablet Take 1 tablet (88 mcg total) by mouth daily.   No facility-administered encounter medications on file as of 12/14/2022.    ALLERGIES: Allergies  Allergen Reactions   Aleve [Naproxen] Nausea Only   Other Anaphylaxis    Fire ant venom   Epinephrine    Catering manager (Solenopsis [place])    Influenza Vac Split Quad     fatigue   VACCINATION STATUS: Immunization History  Administered Date(s) Administered   Influenza-Unspecified 08/18/2021   Moderna Sars-Covid-2 Vaccination 12/02/2019   Tdap 10/19/2012     HPI   Susan Davidson was diagnosed with hyperthyroidism at approximate age of 16 years, which required RAI ablation and subsequent initiation of thyroid hormone replacement. she was given various doses of Levothyroxine over the years, currently on 88 mcg po daily before breakfast.  She has been taking this medication consistently.  Her previsit thyroid function tests are consistent with appropriate replacement.  She has no new complaints.  She remains wheelchair-bound. She is living at home, had previously been in/out of rehab facilitys in the past.  Pt denies feeling nodules in neck, hoarseness, dysphagia/odynophagia, SOB with lying down.  she denies family history of thyroid disorders.  No family history of thyroid cancer.   No recent use of iodine supplements.  Denies use of Biotin containing supplements.  She has never had any previous imaging of her thyroid in the past.  I reviewed her chart and she also has a history of CVA, encephalopathy, CKD, depression, HLD, Osteopenia, vitamin D deficiency.   Review of systems  Constitutional: + Minimally fluctuating body  weight,  current Body mass index is 25.61 kg/m. , no fatigue, no subjective hyperthermia, + subjective hypothermia Eyes: no blurry vision, no xerophthalmia ENT: no sore throat, no nodules palpated in  throat, no dysphagia/odynophagia, no hoarseness Cardiovascular: no chest pain, no shortness of breath, no palpitations, no leg swelling Respiratory: no cough, no shortness of breath Gastrointestinal: no nausea/vomiting/diarrhea Musculoskeletal: no muscle/joint aches, WC bound from previous stroke Skin: no rashes, no hyperemia Neurological: no tremors, no numbness, no tingling, no dizziness Psychiatric: no depression, no anxiety   Objective:   Objective     BP (!) 128/90 (BP Location: Right Arm, Patient Position: Sitting, Cuff Size: Large) Comment: Retake manuel cuff  Pulse 73   Ht '5\' 2"'$  (1.575 m)   Wt 140 lb (63.5 kg) Comment: per husband, patient wheel chair  BMI 25.61 kg/m  Wt Readings from Last 3 Encounters:  12/14/22 140 lb (63.5 kg)  10/10/22 130 lb (59 kg)  07/08/22 117 lb (53.1 kg)    BP Readings from Last 3 Encounters:  12/14/22 (!) 128/90  12/12/22 125/64  10/10/22 128/62      Physical Exam- Limited  Constitutional:  Body mass index is 25.61 kg/m. , not in acute distress, normal state of mind, pale complexion Musculoskeletal: no gross deformities, WC bound after past CVA    CMP ( most recent) CMP     Component Value Date/Time   NA 143 06/07/2022 1702   K 4.6 06/07/2022 1702   CL 103 06/07/2022 1702   CO2 31 06/07/2022 1702   GLUCOSE 98 06/07/2022 1702   BUN 29 (H) 06/07/2022 1702   CREATININE 0.84 06/07/2022 1702   CALCIUM 9.3 06/07/2022 1702   PROT 5.8 (L) 01/21/2022 0459   ALBUMIN 2.7 (L) 01/21/2022 0459   AST 14 (L) 01/21/2022 0459   ALT 12 01/21/2022 0459   ALKPHOS 40 01/21/2022 0459   BILITOT 0.4 01/21/2022 0459   GFRNONAA >60 06/07/2022 1702   GFRAA 73 02/12/2008 1525     Diabetic Labs (most recent): Lab Results  Component Value Date    HGBA1C 6.1 01/06/2022   HGBA1C 6.1 10/29/2021   HGBA1C 6.4 (H) 01/27/2021     Lipid Panel ( most recent) Lipid Panel     Component Value Date/Time   CHOL 214 (H) 01/06/2022 1056   TRIG 194.0 (H) 01/06/2022 1056   HDL 54.60 01/06/2022 1056   CHOLHDL 4 01/06/2022 1056   VLDL 38.8 01/06/2022 1056   LDLCALC 120 (H) 01/06/2022 1056   LDLDIRECT 137.5 10/19/2012 1106       Lab Results  Component Value Date   TSH 4.010 12/13/2022   TSH 2.270 05/30/2022   TSH 1.348 01/18/2022   TSH 1.38 01/06/2022   TSH 2.01 10/29/2021   TSH 1.402 08/03/2021   TSH 1.42 07/22/2021   TSH 20.460 (H) 05/05/2021   TSH 26.064 (H) 05/04/2021   TSH 26.362 (H) 01/27/2021   FREET4 1.30 12/13/2022   FREET4 1.48 05/30/2022   FREET4 0.66 01/28/2021   FREET4 0.38 (L) 11/09/2020     Latest Reference Range & Units 10/29/21 16:06 01/06/22 10:56 01/18/22 19:23 05/30/22 11:06 12/13/22 15:43  TSH 0.450 - 4.500 uIU/mL 2.01 1.38 1.348 2.270 4.010  T4,Free(Direct) 0.82 - 1.77 ng/dL    1.48 1.30    Assessment & Plan:   ASSESSMENT / PLAN:  1. Hypothyroidism-s/p RAI ablation for hyperthyroidism many years ago   Patient with long-standing hypothyroidism, on Levothyroxine therapy. On physical exam, patient does not have gross goiter, thyroid nodules, or neck compression symptoms.     -Her previsit thyroid function tests are consistent with appropriate replacement.  She is advised to continue levothyroxine 88 mcg p.o. daily before breakfast.  I did recommend changing MVI administration time to nights to avoid absorption issues.   - We discussed about the correct intake of her thyroid hormone, on empty stomach at fasting, with water, separated by at least 30 minutes from breakfast and other medications,  and separated by more than 4 hours from calcium, iron, multivitamins, acid reflux medications (PPIs). -Patient is made aware of the fact that thyroid hormone replacement is needed for life, dose to be adjusted by  periodic monitoring of thyroid function tests.     I spent  29  minutes in the care of the patient today including review of labs from Thyroid Function, CMP, and other relevant labs ; imaging/biopsy records (current and previous including abstractions from other facilities); face-to-face time discussing  her lab results and symptoms, medications doses, her options of short and long term treatment based on the latest standards of care / guidelines;   and documenting the encounter.  [name]  participated in the discussions, expressed understanding, and voiced agreement with the above plans.  All questions were answered to her satisfaction. she is encouraged to contact clinic should she have any questions or concerns prior to her return visit.    FOLLOW UP PLAN:  Return in about 6 months (around 06/16/2023) for Thyroid follow up, Previsit labs.  Rayetta Pigg, [facility] [address] [place], [place] [address] Phone: [phone] Fax: [phone]  12/14/2022, 10:00 AM

## 2022-12-26 ENCOUNTER — Telehealth: Payer: Self-pay | Admitting: Internal Medicine

## 2022-12-26 ENCOUNTER — Telehealth: Payer: Self-pay

## 2022-12-26 MED ORDER — ROSUVASTATIN CALCIUM 5 MG PO TABS: ORAL_TABLET | ORAL | 8 refills | Status: DC

## 2022-12-26 NOTE — Telephone Encounter (Signed)
Called patient to schedule Medicare Annual Wellness Visit (AWV). Unable to reach patient.  Last date of AWV: eligible for AWV-I as of 04/03/15  Please schedule an appointment at any time with [facility].    Norton Blizzard, [place] ([facility])  [place] [facility] [phone]

## 2022-12-26 NOTE — Telephone Encounter (Signed)
Prescription Request  12/26/2022  LOV: 10/10/2022  What is the name of the medication or equipment? resouvastatin  Have you contacted your pharmacy to request a refill? No   Which pharmacy would you like this sent to?  [facility] [id number] - [address], [address] [place] [address] Phone: [phone] Fax: [phone]    Patient notified that their request is being sent to the clinical staff for review and that they should receive a response within 2 business days.   Please advise at Mobile [phone] (mobile)

## 2022-12-26 NOTE — Telephone Encounter (Signed)
Refill has been sent to [facility] 

## 2023-01-09 ENCOUNTER — Ambulatory Visit: Payer: Medicare HMO | Admitting: Internal Medicine

## 2023-02-14 ENCOUNTER — Telehealth: Payer: Self-pay | Admitting: Neurology

## 2023-02-14 NOTE — Telephone Encounter (Signed)
Left msg to return call 1st attempt

## 2023-02-14 NOTE — Telephone Encounter (Signed)
Spouse states when pt was seen by Dr Terrace Arabia they were told a MRA & MRI would be ordered, they have never been contacted for the scheduling of these scans.  Spouse is asking if Dr Terrace Arabia is still wanting these done , and if so does she want it done before pt's next appointment.

## 2023-02-14 NOTE — Addendum Note (Signed)
Addended by: Levert Feinstein on: 02/14/2023 05:08 PM   Modules accepted: Orders

## 2023-02-14 NOTE — Telephone Encounter (Signed)
Yes, I put in the order of MRI cervical, MRA of brain and neck as we discussed during her office visit

## 2023-02-15 ENCOUNTER — Telehealth: Payer: Self-pay | Admitting: Neurology

## 2023-02-15 ENCOUNTER — Telehealth: Payer: Self-pay

## 2023-02-15 NOTE — Telephone Encounter (Signed)
Ethlyn Gallery: [id number] exp. 02/15/23-03/17/23 sent to GI [phone]

## 2023-02-15 NOTE — Telephone Encounter (Signed)
Left msg to call back.

## 2023-02-15 NOTE — Telephone Encounter (Signed)
Spoke with husband and advised orders were placed for MRI and MRA and he should be hearing from someone soon about scheduling an appointment. Husband appreciative of call.

## 2023-03-02 ENCOUNTER — Other Ambulatory Visit: Payer: Self-pay | Admitting: Internal Medicine

## 2023-03-02 MED ORDER — ALPRAZOLAM 0.5 MG PO TABS: 0.5000 mg | ORAL_TABLET | Freq: Two times a day (BID) | ORAL | 2 refills | Status: DC | PRN

## 2023-03-06 ENCOUNTER — Ambulatory Visit
Admission: RE | Admit: 2023-03-06 | Discharge: 2023-03-06 | Disposition: A | Discharge status: Home / Self Care | Payer: Medicare HMO | Source: Ambulatory Visit | Attending: Neurology | Admitting: Neurology

## 2023-03-06 ENCOUNTER — Other Ambulatory Visit: Payer: Self-pay | Admitting: Neurology

## 2023-03-06 DIAGNOSIS — I729 Aneurysm of unspecified site: Secondary | ICD-10-CM

## 2023-03-06 DIAGNOSIS — R29898 Other symptoms and signs involving the musculoskeletal system: Secondary | ICD-10-CM | POA: Diagnosis not present

## 2023-03-06 DIAGNOSIS — I639 Cerebral infarction, unspecified: Secondary | ICD-10-CM

## 2023-03-14 ENCOUNTER — Telehealth: Payer: Self-pay

## 2023-03-14 DIAGNOSIS — R29898 Other symptoms and signs involving the musculoskeletal system: Secondary | ICD-10-CM

## 2023-03-14 DIAGNOSIS — M48061 Spinal stenosis, lumbar region without neurogenic claudication: Secondary | ICD-10-CM

## 2023-03-14 NOTE — Telephone Encounter (Signed)
-----   Message from [place], [place] sent at 03/14/2023  8:20 AM EDT ----- Routing to correct pod ----- Message ----- From: Suanne Marker, MD Sent: 03/13/2023   5:42 PM EDT To: Eather Colas, CMA  Yes can refer to PT / OT; try acupuncture, massage therapy. Can also refer to spine / pain mgmt clinic for other treatments. -VRP  ----- Message ----- From: Eather Colas, CMA Sent: 03/08/2023   8:12 AM EDT To: Suanne Marker, MD  Husband mark called result relayed. He stated that the pt has complained of neck pain ongoing for 2 months he massages her neck and head. He wanted to know if there would be any recommendations regarding alleviating pain

## 2023-03-14 NOTE — Telephone Encounter (Signed)
I called and spoke with the patient's husband, Loraine Leriche (as per [facility]). I reviewed options with him. He would like for the patient to see a spine specialist for further evaluation.

## 2023-03-15 ENCOUNTER — Telehealth: Payer: Self-pay | Admitting: Diagnostic Neuroimaging

## 2023-03-15 NOTE — Telephone Encounter (Signed)
Referral faxed to [facility] Phone: [phone]  Fax: [phone]

## 2023-03-27 ENCOUNTER — Ambulatory Visit (INDEPENDENT_AMBULATORY_CARE_PROVIDER_SITE_OTHER): Payer: Medicare HMO | Admitting: Podiatry

## 2023-03-27 ENCOUNTER — Encounter: Payer: Self-pay | Admitting: Podiatry

## 2023-03-27 DIAGNOSIS — M79675 Pain in left toe(s): Secondary | ICD-10-CM | POA: Diagnosis not present

## 2023-03-27 DIAGNOSIS — B351 Tinea unguium: Secondary | ICD-10-CM

## 2023-03-27 DIAGNOSIS — M79674 Pain in right toe(s): Secondary | ICD-10-CM | POA: Diagnosis not present

## 2023-03-27 DIAGNOSIS — N1831 Chronic kidney disease, stage 3a: Secondary | ICD-10-CM | POA: Diagnosis not present

## 2023-03-27 NOTE — Progress Notes (Signed)
This patient presents to the office with chief complaint of long thick painful nails.  Patient says the nails are painful walking and wearing shoes.  This patient is unable to self treat.  This patient is unable to trim her nails since she is unable to reach her nails.  She present to the office in a wheelchair accompanied by female caregiver. She presents to the office for preventative foot care services.  General Appearance  Alert, conversant and in no acute stress.  Vascular  Dorsalis pedis and posterior tibial  pulses are weakly  palpable  bilaterally.  Capillary return is within normal limits  bilaterally. Temperature is within normal limits  bilaterally. Purplish feet  B/L.  Neurologic  Senn-Weinstein monofilament wire test within normal limits  bilaterally. Muscle power within normal limits bilaterally.  Nails Thick disfigured discolored nails with subungual debris  from hallux to fifth toes bilaterally. No evidence of bacterial infection or drainage bilaterally.  Orthopedic  No limitations of motion  feet .  No crepitus or effusions noted.  No bony pathology or digital deformities noted.  Skin  normotropic skin with no porokeratosis noted bilaterally.  No signs of infections or ulcers noted.     Onychomycosis  Nails  B/L.  Pain in right toes  Pain in left toes  Debridement of nails both feet followed trimming the nails with dremel tool.    RTC 4  months.   Colter Magowan DPM   

## 2023-04-10 ENCOUNTER — Ambulatory Visit (INDEPENDENT_AMBULATORY_CARE_PROVIDER_SITE_OTHER): Payer: Medicare HMO | Admitting: Internal Medicine

## 2023-04-10 ENCOUNTER — Encounter: Payer: Self-pay | Admitting: Internal Medicine

## 2023-04-10 VITALS — BP 126/78 | HR 67 | Temp 98.6°F | Ht 62.0 in

## 2023-04-10 DIAGNOSIS — E782 Mixed hyperlipidemia: Secondary | ICD-10-CM | POA: Diagnosis not present

## 2023-04-10 DIAGNOSIS — E538 Deficiency of other specified B group vitamins: Secondary | ICD-10-CM | POA: Diagnosis not present

## 2023-04-10 DIAGNOSIS — N1831 Chronic kidney disease, stage 3a: Secondary | ICD-10-CM

## 2023-04-10 DIAGNOSIS — I10 Essential (primary) hypertension: Secondary | ICD-10-CM | POA: Diagnosis not present

## 2023-04-10 DIAGNOSIS — E559 Vitamin D deficiency, unspecified: Secondary | ICD-10-CM | POA: Diagnosis not present

## 2023-04-10 DIAGNOSIS — R1013 Epigastric pain: Secondary | ICD-10-CM

## 2023-04-10 DIAGNOSIS — E890 Postprocedural hypothyroidism: Secondary | ICD-10-CM | POA: Diagnosis not present

## 2023-04-10 DIAGNOSIS — R7302 Impaired glucose tolerance (oral): Secondary | ICD-10-CM

## 2023-04-10 DIAGNOSIS — E2839 Other primary ovarian failure: Secondary | ICD-10-CM

## 2023-04-10 DIAGNOSIS — M8580 Other specified disorders of bone density and structure, unspecified site: Secondary | ICD-10-CM | POA: Diagnosis not present

## 2023-04-10 LAB — LIPID PANEL
Cholesterol: 178 mg/dL (ref 0–200)
HDL: 50.5 mg/dL (ref >39.00)
LDL Cholesterol: 94 mg/dL (ref 0–99)
NonHDL: 127.11
Total CHOL/HDL Ratio: 4
Triglycerides: 164 mg/dL — ABNORMAL HIGH (ref 0.0–149.0)
VLDL: 32.8 mg/dL (ref 0.0–40.0)

## 2023-04-10 LAB — BASIC METABOLIC PANEL
BUN: 25 mg/dL — ABNORMAL HIGH (ref 6–23)
CO2: 30 mEq/L (ref 19–32)
Calcium: 10.1 mg/dL (ref 8.4–10.5)
Chloride: 100 mEq/L (ref 96–112)
Creatinine, Ser: 1.04 mg/dL (ref 0.40–1.20)
GFR: 53.13 mL/min — ABNORMAL LOW (ref >60.00)
Glucose, Bld: 106 mg/dL — ABNORMAL HIGH (ref 70–99)
Potassium: 4.5 mEq/L (ref 3.5–5.1)
Sodium: 142 mEq/L (ref 135–145)

## 2023-04-10 LAB — CBC WITH DIFFERENTIAL/PLATELET
Basophils Absolute: 0 10*3/uL (ref 0.0–0.1)
Basophils Relative: 0.4 % (ref 0.0–3.0)
Eosinophils Absolute: 0.2 10*3/uL (ref 0.0–0.7)
Eosinophils Relative: 2.6 % (ref 0.0–5.0)
HCT: 49.8 % — ABNORMAL HIGH (ref 36.0–46.0)
Hemoglobin: 16 g/dL — ABNORMAL HIGH (ref 12.0–15.0)
Lymphocytes Relative: 21.8 % (ref 12.0–46.0)
Lymphs Abs: 1.6 10*3/uL (ref 0.7–4.0)
MCHC: 32.2 g/dL (ref 30.0–36.0)
MCV: 102.8 fl — ABNORMAL HIGH (ref 78.0–100.0)
Monocytes Absolute: 0.5 10*3/uL (ref 0.1–1.0)
Monocytes Relative: 6.4 % (ref 3.0–12.0)
Neutro Abs: 5 10*3/uL (ref 1.4–7.7)
Neutrophils Relative %: 68.8 % (ref 43.0–77.0)
Platelets: 225 10*3/uL (ref 150.0–400.0)
RBC: 4.84 Mil/uL (ref 3.87–5.11)
RDW: 14.9 % (ref 11.5–15.5)
WBC: 7.2 10*3/uL (ref 4.0–10.5)

## 2023-04-10 LAB — HEPATIC FUNCTION PANEL
ALT: 16 U/L (ref 0–35)
AST: 29 U/L (ref 0–37)
Albumin: 4.4 g/dL (ref 3.5–5.2)
Alkaline Phosphatase: 58 U/L (ref 39–117)
Bilirubin, Direct: 0.1 mg/dL (ref 0.0–0.3)
Total Bilirubin: 0.3 mg/dL (ref 0.2–1.2)
Total Protein: 8.3 g/dL (ref 6.0–8.3)

## 2023-04-10 LAB — HEMOGLOBIN A1C: Hgb A1c MFr Bld: 6.3 % (ref 4.6–6.5)

## 2023-04-10 LAB — TSH: TSH: 4.86 u[IU]/mL (ref 0.35–5.50)

## 2023-04-10 LAB — VITAMIN B12: Vitamin B-12: >1500 pg/mL — ABNORMAL HIGH (ref 211–911)

## 2023-04-10 LAB — VITAMIN D 25 HYDROXY (VIT D DEFICIENCY, FRACTURES): VITD: 84.27 ng/mL (ref 30.00–100.00)

## 2023-04-10 LAB — H. PYLORI ANTIBODY, IGG: H Pylori IgG: NEGATIVE

## 2023-04-10 MED ORDER — PANTOPRAZOLE SODIUM 40 MG PO TBEC
40.0000 mg | DELAYED_RELEASE_TABLET | Freq: Every day | ORAL | 11 refills | Status: DC
Start: 2023-04-10 — End: 2023-06-08

## 2023-04-10 NOTE — Progress Notes (Signed)
The test results show that your current treatment is OK, as the tests are stable.  Please continue the same plan.  There is no other need for change of treatment or further evaluation based on these results, at this time.  thanks 

## 2023-04-10 NOTE — Progress Notes (Signed)
Patient ID: Susan Davidson, female   DOB: 04-22-1951, 72 y.o.   MRN: [id number]       Chief Complaint: follow up low b12, ckd3a, htn, hld, low thyroid, hyperglycemia, osteopenia, low vit d with son       HPI:  Susan Davidson is a 72 y.o. female here overall doing ok. Pt denies chest pain, increased sob or doe, wheezing, orthopnea, PND, increased LE swelling, palpitations, dizziness or syncope.   Pt denies polydipsia, polyuria, or new focal neuro s/s.    Pt denies fever, wt loss, night sweats, loss of appetite, or other constitutional symptoms  Denies worsening reflux, abd pain, dysphagia, n/v, bowel change or blood except has had vague generalized abd pains, mostly upper mid abd.  Denies hyper or hypo thyroid symptoms such as voice, skin or hair change.  Also seeing NS with back pain - s/p recent MRI brain and c spine with f/u planned for NS and neurology.  Due for DXA.  Due for colonoscopy   Pt denies polydipsia, polyuria, or new focal neuro s/s.     Wt Readings from Last 3 Encounters:  12/14/22 140 lb (63.5 kg)  10/10/22 130 lb (59 kg)  07/08/22 117 lb (53.1 kg)   BP Readings from Last 3 Encounters:  04/10/23 126/78  12/14/22 (!) 128/90  12/12/22 125/64         Past Medical History:  Diagnosis Date   ANXIETY 02/12/2008   Qualifier: Diagnosis of  By: Jonny Ruiz MD, Len Blalock    CVA (cerebral vascular accident) [facility])    DEPRESSION 02/12/2008   Qualifier: Diagnosis of  By: Maris Berger    HYPERLIPIDEMIA 02/12/2008   Qualifier: Diagnosis of  By: Maris Berger    HYPERTENSION 02/12/2008   Qualifier: Diagnosis of  By: Maris Berger    HYPOTHYROIDISM 02/12/2008   Qualifier: Diagnosis of  By: Maris Berger    Impaired glucose tolerance 08/27/2011   Left hemiparesis (HCC)    OSTEOPENIA 02/12/2008   Qualifier: Diagnosis of  By: Jonny Ruiz MD, Len Blalock    VITAMIN D DEFICIENCY 04/14/2010   Qualifier: Diagnosis of  By: Jonny Ruiz MD, Len Blalock    Past Surgical History:   Procedure Laterality Date   IR RADIOLOGIST EVAL & MGMT  08/18/2021    reports that she quit smoking about 2 years ago. Her smoking use included cigarettes. She has never used smokeless tobacco. She reports that she does not currently use alcohol. She reports that she does not use drugs. family history includes Bipolar disorder in her sister; Heart disease in her father. Allergies  Allergen Reactions   Aleve [Naproxen] Nausea Only   Other Anaphylaxis    Fire ant venom   Epinephrine    Animator (Solenopsis [place])    Influenza Vac Split Quad     fatigue   Current Outpatient Medications on File Prior to Visit  Medication Sig Dispense Refill   acetaminophen (TYLENOL) 325 MG tablet Take 2 tablets (650 mg total) by mouth every 6 (six) hours as needed for mild pain or headache (fever >/= 101). 12 tablet 2   ALPRAZolam (XANAX) 0.5 MG tablet Take 1 tablet (0.5 mg total) by mouth 2 (two) times daily as needed for anxiety. 60 tablet 2   ascorbic acid (VITAMIN C) 500 MG tablet Take 1 tablet (500 mg total) by mouth daily. 30 tablet 2   aspirin EC 81 MG tablet Take 1 tablet (81 mg total) by mouth daily with breakfast.  Swallow whole. 30 tablet 11   clopidogrel (PLAVIX) 75 MG tablet Take 1 tablet (75 mg total) by mouth daily. 90 tablet 3   divalproex (DEPAKOTE) 250 MG DR tablet Take 1 tablet by mouth See admin instructions. 1 tablet in the morning and 2 tablets every evening     EPINEPHrine 0.3 mg/0.3 mL IJ SOAJ injection Inject 0.3 mg into the muscle as needed for anaphylaxis. 1 each 2   levothyroxine (SYNTHROID) 88 MCG tablet Take 1 tablet (88 mcg total) by mouth daily. 90 tablet 1   melatonin 5 MG TABS Take 5 mg by mouth.     memantine (NAMENDA) 5 MG tablet Take 1 tablet (5 mg total) by mouth 2 (two) times daily. 180 tablet 3   Multiple Vitamin (MULTIVITAMIN) tablet Take 1 tablet by mouth daily.     OVER THE COUNTER MEDICATION Take 1 tablet by mouth daily. magnesium     PARoxetine (PAXIL) 40 MG  tablet Take 40 mg by mouth every morning.     polyethylene glycol (MIRALAX / GLYCOLAX) 17 g packet Take 17 g by mouth daily as needed for mild constipation. 14 each 0   rosuvastatin (CRESTOR) 5 MG tablet TAKE 1 TABLET BY MOUTH DAILY. HOLD CRESTOR WHILE TAKING PAXLOVID 30 tablet 8   No current facility-administered medications on file prior to visit.        ROS:  All others reviewed and negative.  Objective        PE:  BP 126/78 (BP Location: Right Arm, Patient Position: Sitting, Cuff Size: Normal)   Pulse 67   Temp 98.6 F (37 C) (Oral)   Ht 5\' 2"  (1.575 m)   SpO2 90%   BMI 25.61 kg/m                 Constitutional: Pt appears in NAD, wheelchair bound unable to stand               HENT: Head: NCAT.                Right Ear: External ear normal.                 Left Ear: External ear normal.                Eyes: . Pupils are equal, round, and reactive to light. Conjunctivae and EOM are normal               Nose: without d/c or deformity               Neck: Neck supple. Gross normal ROM               Cardiovascular: Normal rate and regular rhythm.                 Pulmonary/Chest: Effort normal and breath sounds without rales or wheezing.                Abd:  Soft, NT, ND, + BS, no organomegaly               Neurological: Pt is alert. At baseline orientation, motor grossly intact               Skin: Skin is warm. No rashes, no other new lesions, LE edema - none               Psychiatric: Pt behavior is normal without agitation   Micro: none  Cardiac tracings I have  personally interpreted today:  none  Pertinent Radiological findings (summarize): none   Lab Results  Component Value Date   WBC 7.2 04/10/2023   HGB 16.0 (H) 04/10/2023   HCT 49.8 (H) 04/10/2023   PLT 225.0 04/10/2023   GLUCOSE 106 (H) 04/10/2023   CHOL 178 04/10/2023   TRIG 164.0 (H) 04/10/2023   HDL 50.50 04/10/2023   LDLDIRECT 137.5 10/19/2012   LDLCALC 94 04/10/2023   ALT 16 04/10/2023   AST 29  04/10/2023   NA 142 04/10/2023   K 4.5 specimen slightly hemolyzed 04/10/2023   CL 100 04/10/2023   CREATININE 1.04 04/10/2023   BUN 25 (H) 04/10/2023   CO2 30 04/10/2023   TSH 4.86 04/10/2023   INR 0.9 02/01/2021   HGBA1C 6.3 04/10/2023   Assessment/Plan:  Susan Davidson is a 72 y.o. White or Caucasian [1] female with  has a past medical history of ANXIETY (02/12/2008), CVA (cerebral vascular accident) (HCC), DEPRESSION (02/12/2008), HYPERLIPIDEMIA (02/12/2008), HYPERTENSION (02/12/2008), HYPOTHYROIDISM (02/12/2008), Impaired glucose tolerance (08/27/2011), Left hemiparesis (HCC), OSTEOPENIA (02/12/2008), and VITAMIN D DEFICIENCY (04/14/2010).  B12 deficiency Lab Results  Component Value Date   VITAMINB12 >1500 (H) 04/10/2023   Stable, cont oral replacement - b12 1000 mcg qd   CKD (chronic kidney disease) Lab Results  Component Value Date   CREATININE 1.04 04/10/2023   Stable overall, cont to avoid nephrotoxins   Essential hypertension BP Readings from Last 3 Encounters:  04/10/23 126/78  12/14/22 (!) 128/90  12/12/22 125/64   Stable, pt to continue medical treatment  - diet, wt control   HLD (hyperlipidemia) Lab Results  Component Value Date   LDLCALC 94 04/10/2023   uncontrolled, pt to continue current statin crestor 5 mg every day, declines change for now   Hypothyroidism Lab Results  Component Value Date   TSH 4.86 04/10/2023   Stable, pt to continue levothyroxine 88 mcg qd  Impaired glucose tolerance Lab Results  Component Value Date   HGBA1C 6.3 04/10/2023   Stable, pt to continue current medical treatment  - diet, wt control   Osteopenia Also for DXA if able to tolerate  Vitamin D deficiency Last vitamin D Lab Results  Component Value Date   VD25OH 84.27 04/10/2023   Stable, cont oral replacement   Epigastric pain Etiology unclear, for lab including cbc, h pylori, start protonix 40 every day, refer GI - may need EGD, and also due for  colonoscopy  Followup: Return in about 6 months (around 10/11/2023).  Oliver Barre, MD 04/11/2023 8:54 PM [facility]

## 2023-04-10 NOTE — Patient Instructions (Addendum)
Please have your Shingrix (shingles) shots done at your local pharmacyy, and the Tdap tetanus shot as well  Please schedule the bone density test before leaving today at the scheduling desk (where you check out)  Please take all new medication as prescribed  - the protonix  Please continue all other medications as before, and refills have been done if requested.  Please have the pharmacy call with any other refills you may need.  Please continue your efforts at being more active, low cholesterol diet, and weight control.  Please keep your appointments with your specialists as you may have planned  You will be contacted regarding the referral for: Gastroenterology  Please go to the LAB at the blood drawing area for the tests to be done  You will be contacted by phone if any changes need to be made immediately.  Otherwise, you will receive a letter about your results with an explanation, but please check with MyChart first.  Please remember to sign up for MyChart if you have not done so, as this will be important to you in the future with finding out test results, communicating by private email, and scheduling acute appointments online when needed.  Please make an Appointment to return in 6 months, or sooner if needed

## 2023-04-11 ENCOUNTER — Encounter: Payer: Self-pay | Admitting: Internal Medicine

## 2023-04-11 DIAGNOSIS — R1013 Epigastric pain: Secondary | ICD-10-CM | POA: Insufficient documentation

## 2023-04-11 NOTE — Assessment & Plan Note (Signed)
Lab Results  Component Value Date   HGBA1C 6.3 04/10/2023   Stable, pt to continue current medical treatment  - diet, wt control

## 2023-04-11 NOTE — Assessment & Plan Note (Signed)
Last vitamin D Lab Results  Component Value Date   VD25OH 84.27 04/10/2023   Stable, cont oral replacement

## 2023-04-11 NOTE — Assessment & Plan Note (Signed)
Etiology unclear, for lab including cbc, h pylori, start protonix 40 every day, refer GI - may need EGD, and also due for colonoscopy

## 2023-04-11 NOTE — Assessment & Plan Note (Signed)
BP Readings from Last 3 Encounters:  04/10/23 126/78  12/14/22 (!) 128/90  12/12/22 125/64   Stable, pt to continue medical treatment  - diet, wt control

## 2023-04-11 NOTE — Assessment & Plan Note (Signed)
Lab Results  Component Value Date   VITAMINB12 >1500 (H) 04/10/2023   Stable, cont oral replacement - b12 1000 mcg qd

## 2023-04-11 NOTE — Assessment & Plan Note (Signed)
Lab Results  Component Value Date   CREATININE 1.04 04/10/2023   Stable overall, cont to avoid nephrotoxins

## 2023-04-11 NOTE — Assessment & Plan Note (Signed)
Lab Results  Component Value Date   TSH 4.86 04/10/2023   Stable, pt to continue levothyroxine 88 mcg qd

## 2023-04-11 NOTE — Assessment & Plan Note (Signed)
Lab Results  Component Value Date   LDLCALC 94 04/10/2023   uncontrolled, pt to continue current statin crestor 5 mg every day, declines change for now

## 2023-04-11 NOTE — Assessment & Plan Note (Signed)
Also for DXA if able to tolerate

## 2023-04-13 DIAGNOSIS — I671 Cerebral aneurysm, nonruptured: Secondary | ICD-10-CM | POA: Diagnosis not present

## 2023-04-13 DIAGNOSIS — M5090 Cervical disc disorder, unspecified, unspecified cervical region: Secondary | ICD-10-CM | POA: Diagnosis not present

## 2023-04-20 ENCOUNTER — Encounter (INDEPENDENT_AMBULATORY_CARE_PROVIDER_SITE_OTHER): Payer: Self-pay | Admitting: Gastroenterology

## 2023-04-20 ENCOUNTER — Ambulatory Visit (HOSPITAL_COMMUNITY)
Admission: RE | Admit: 2023-04-20 | Discharge: 2023-04-20 | Disposition: A | Discharge status: Home / Self Care | Payer: Medicare HMO | Source: Ambulatory Visit | Attending: Gastroenterology | Admitting: Gastroenterology

## 2023-04-20 ENCOUNTER — Ambulatory Visit (INDEPENDENT_AMBULATORY_CARE_PROVIDER_SITE_OTHER): Payer: Medicare HMO | Admitting: Gastroenterology

## 2023-04-20 VITALS — BP 176/90 | HR 71 | Temp 97.1°F | Ht 62.0 in | Wt 140.0 lb

## 2023-04-20 DIAGNOSIS — K5904 Chronic idiopathic constipation: Secondary | ICD-10-CM | POA: Diagnosis not present

## 2023-04-20 DIAGNOSIS — J9811 Atelectasis: Secondary | ICD-10-CM | POA: Diagnosis not present

## 2023-04-20 MED ORDER — POLYETHYLENE GLYCOL 3350 17 G PO PACK
17.0000 g | PACK | Freq: Two times a day (BID) | ORAL | 2 refills | Status: DC
Start: 2023-04-20 — End: 2023-05-17

## 2023-04-20 NOTE — Progress Notes (Signed)
Vista Lawman , M.D. Gastroenterology & Hepatology [facility]enter[facility] [address] [place], [place] [address] Primary Care Physician: Corwin Levins, MD [address] [place] [address]  Chief Complaint:  Abdominal pain and cramps  History of Present Illness: Susan Davidson is a 72 y.o. 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  Patient appears to be slow in mentation and appears to have mild cognitive impairment  Patient and husband reports reports that she has diffuse abdominal discomfort, intermittent, dull ache, nonradiating relieved with defecation.  No aggravating factors.  Patient has altered bowel movements where she would have 1 month bowel movement every 3 days which is followed by liquid stool many times a day .The patient and husband  denies having any nausea, vomiting, fever, chills, hematochezia, melena, hematemesis,  jaundice, pruritus or weight loss.  Since his stroke her appetite has been very limited.  She eats cereal and eggs sandwich   Last EGD: None  Last Colonoscopy:None  FHx: neg for any gastrointestinal/liver disease, no malignancies Social: neg smoking, alcohol or illicit drug use  Past Medical History: Past Medical History:  Diagnosis Date   ANXIETY 02/12/2008   Qualifier: Diagnosis of  By: Jonny Ruiz MD, Len Blalock    CVA (cerebral vascular accident) [facility])    DEPRESSION 02/12/2008   Qualifier: Diagnosis of  By: Maris Berger    HYPERLIPIDEMIA 02/12/2008   Qualifier: Diagnosis of  By: Maris Berger    HYPERTENSION 02/12/2008   Qualifier: Diagnosis of  By: Maris Berger    HYPOTHYROIDISM 02/12/2008   Qualifier: Diagnosis of  By: Maris Berger    Impaired glucose tolerance 08/27/2011   Left hemiparesis (HCC)     OSTEOPENIA 02/12/2008   Qualifier: Diagnosis of  By: Jonny Ruiz MD, Len Blalock    VITAMIN D DEFICIENCY 04/14/2010   Qualifier: Diagnosis of  By: Jonny Ruiz MD, Len Blalock     Past Surgical History: Past Surgical History:  Procedure Laterality Date   IR RADIOLOGIST EVAL & MGMT  08/18/2021    Family History: Family History  Problem Relation Age of Onset   Heart disease Father    Bipolar disorder Sister    Diabetes Neg Hx     Social History: Social History   Tobacco Use  Smoking Status Former   Current packs/day: 0.00   Types: Cigarettes   Quit date: 01/26/2021   Years since quitting: 2.2  Smokeless Tobacco Never   Social History   Substance and Sexual Activity  Alcohol Use Not Currently   Social History   Substance and Sexual Activity  Drug Use No    Allergies: Allergies  Allergen Reactions   Aleve [Naproxen] Nausea Only   Other Anaphylaxis    Fire ant venom   Epinephrine    Animator (Solenopsis [place])    Influenza Vac Split Quad     fatigue    Medications: Current Outpatient Medications  Medication Sig Dispense Refill   acetaminophen (TYLENOL) 325 MG tablet Take 2 tablets (650 mg total) by mouth every 6 (six) hours as needed for mild pain or headache (fever >/= 101). 12 tablet 2   ALPRAZolam (XANAX) 0.5 MG tablet Take 1 tablet (0.5 mg total) by mouth 2 (two) times daily as needed for anxiety. 60 tablet 2   ascorbic  acid (VITAMIN C) 500 MG tablet Take 1 tablet (500 mg total) by mouth daily. 30 tablet 2   clopidogrel (PLAVIX) 75 MG tablet Take 1 tablet (75 mg total) by mouth daily. 90 tablet 3   divalproex (DEPAKOTE) 250 MG DR tablet Take 1 tablet by mouth See admin instructions. 1 tablet in the morning and 2 tablets every evening     EPINEPHrine 0.3 mg/0.3 mL IJ SOAJ injection Inject 0.3 mg into the muscle as needed for anaphylaxis. 1 each 2   levothyroxine (SYNTHROID) 88 MCG tablet Take 1 tablet (88 mcg total) by mouth daily. 90 tablet 1   melatonin 5 MG TABS Take 5  mg by mouth.     memantine (NAMENDA) 5 MG tablet Take 1 tablet (5 mg total) by mouth 2 (two) times daily. 180 tablet 3   Multiple Vitamin (MULTIVITAMIN) tablet Take 1 tablet by mouth daily.     OVER THE COUNTER MEDICATION Take 1 tablet by mouth daily. magnesium     pantoprazole (PROTONIX) 40 MG tablet Take 1 tablet (40 mg total) by mouth daily. 30 tablet 11   PARoxetine (PAXIL) 40 MG tablet Take 40 mg by mouth every morning.     polyethylene glycol (MIRALAX / GLYCOLAX) 17 g packet Take 17 g by mouth daily as needed for mild constipation. 14 each 0   polyethylene glycol (MIRALAX / GLYCOLAX) 17 g packet Take 17 g by mouth 2 (two) times daily. 60 packet 2   rosuvastatin (CRESTOR) 5 MG tablet TAKE 1 TABLET BY MOUTH DAILY. HOLD CRESTOR WHILE TAKING PAXLOVID 30 tablet 8   No current facility-administered medications for this visit.    Review of Systems: GENERAL: negative for malaise, night sweats HEENT: No changes in hearing or vision, no nose bleeds or other nasal problems. NECK: Negative for lumps, goiter, pain and significant neck swelling RESPIRATORY: Negative for cough, wheezing CARDIOVASCULAR: Negative for chest pain, leg swelling, palpitations, orthopnea GI: SEE HPI MUSCULOSKELETAL: Negative for joint pain or swelling, back pain, and muscle pain. SKIN: Negative for lesions, rash HEMATOLOGY Negative for prolonged bleeding, bruising easily, and swollen nodes. ENDOCRINE: Negative for cold or heat intolerance, polyuria, polydipsia and goiter. NEURO: negative for tremor, gait imbalance, syncope and seizures. The remainder of the review of systems is noncontributory.   Physical Exam: BP (!) 176/90 (BP Location: Left Arm, Patient Position: Sitting, Cuff Size: Normal)   Pulse 71   Temp (!) 97.1 F (36.2 C) (Temporal)   Ht 5\' 2"  (1.575 m)   Wt 140 lb (63.5 kg) Comment: weight per patient .  BMI 25.61 kg/m  GENERAL: The patient is AO x2, Wheelchair bound HEENT: Head is normocephalic and  atraumatic. EOMI are intact. Mouth is well hydrated and without lesions. NECK: Supple. No masses LUNGS: Clear to auscultation. No presence of rhonchi/wheezing/rales. Adequate chest expansion HEART: RRR, normal s1 and s2. ABDOMEN: Soft, nontender, no guarding, no peritoneal signs, and nondistended. BS +. No masses. EXTREMITIES: Without any cyanosis, clubbing, rash, lesions or edema.   Imaging/Labs: as above  I personally reviewed and interpreted the available labs, imaging and endoscopic files. HBG: 16   Impression and Plan:   Susan Davidson is a 72 y.o. female with History of right ACA stroke in April 2022 now wheelchair bound, Ckd3a, htn, hld,  presents for evaluation of abdominal pain and flatulence   #Abdominal pain  #Altered Bowel movements   Patient is wheelchair-bound, abdominal pain release with defecation this is likely related to constipation leading to overflow  diarrhea.  Benign abdominal exam, hemoglobin 16, and no red flags such as dysphagia, hematochezia  Will obtain abdominal x-ray to assess stool burden, if large  amount of stool will give bowel purge  adequate fluid intake , 8 glass of water daily High fiber diet : Dates, prunes , pears, Kiwi  Miralax BID followed by Daily   #HCM Patient with advanced age, stroke wheelchair-bound.  Discussed indication risk benefit of screening colonoscopy.  Patient appeared to have mild cognitive impairment and is slowing mentation.  Decision is made by the husband to forego any screening colonoscopy at this time given patient age and comorbid conditions   All questions were answered.      Vista Lawman, MD Gastroenterology and Hepatology [facility] Gastroenterology

## 2023-04-20 NOTE — Patient Instructions (Signed)
It was very nice to meet you today, as dicussed with will plan for the following : Abdominal xray  Adequate fluid intake , 8 glass of water daily High fiber diet : Dates, prunes , pears, Kiwi  Miralax twice daily  followed by Daily

## 2023-04-25 MED ORDER — PEG 3350-KCL-NA BICARB-NACL 420 G PO SOLR: 4000.0000 mL | Freq: Once | ORAL | 0 refills | Status: AC

## 2023-04-25 NOTE — Addendum Note (Signed)
Addended by: Vista Lawman on: 04/25/2023 03:47 PM   Modules accepted: Orders

## 2023-04-27 ENCOUNTER — Ambulatory Visit (INDEPENDENT_AMBULATORY_CARE_PROVIDER_SITE_OTHER): Payer: Medicare HMO

## 2023-04-27 VITALS — Ht 62.0 in | Wt 140.0 lb

## 2023-04-27 DIAGNOSIS — Z0000 Encounter for general adult medical examination without abnormal findings: Secondary | ICD-10-CM | POA: Diagnosis not present

## 2023-04-27 DIAGNOSIS — Z1211 Encounter for screening for malignant neoplasm of colon: Secondary | ICD-10-CM

## 2023-04-27 NOTE — Progress Notes (Signed)
Subjective:   Susan Davidson is a 72 y.o. female who presents for an Initial Medicare Annual Wellness Visit.  Visit Complete: In person  Patient Medicare AWV questionnaire was completed by the patient on 04/10/2023; I have confirmed that all information answered by patient is correct and no changes since this date.  Review of Systems    Cardiac Risk Factors include: advanced age (>62men, >27 women);hypertension;dyslipidemia;Other (see comment), Risk factor comments: CVA, CKD, Hypothyroidism, Osteopenia     Objective:    Today's Vitals   04/27/23 1030  Weight: 140 lb (63.5 kg)  Height: 5\' 2"  (1.575 m)   Body mass index is 25.61 kg/m.     04/27/2023   10:44 AM 06/07/2022    2:46 PM 01/24/2022    9:14 AM 01/18/2022   12:55 PM 08/02/2021    3:32 PM 05/05/2021    2:10 PM 05/05/2021   10:00 AM  Advanced Directives  Does Patient Have a Medical Advance Directive? No Yes Yes Unable to assess, patient is non-responsive or altered mental status No  No  Type of Advance Directive  Healthcare Power of [employer] Power of Attorney      Does patient want to make changes to medical advance directive?   No - Patient declined      Copy of Healthcare Power of Attorney in Chart?   No - copy requested      Would patient like information on creating a medical advance directive? Yes (MAU/Ambulatory/Procedural Areas - Information given)    No - Patient declined No - Patient declined     Current Medications (verified) Outpatient Encounter Medications as of 04/27/2023  Medication Sig   acetaminophen (TYLENOL) 325 MG tablet Take 2 tablets (650 mg total) by mouth every 6 (six) hours as needed for mild pain or headache (fever >/= 101).   ALPRAZolam (XANAX) 0.5 MG tablet Take 1 tablet (0.5 mg total) by mouth 2 (two) times daily as needed for anxiety.   ascorbic acid (VITAMIN C) 500 MG tablet Take 1 tablet (500 mg total) by mouth daily.   clopidogrel (PLAVIX) 75 MG tablet Take 1 tablet (75 mg total)  by mouth daily.   divalproex (DEPAKOTE) 250 MG DR tablet Take 1 tablet by mouth See admin instructions. 1 tablet in the morning and 2 tablets every evening   EPINEPHrine 0.3 mg/0.3 mL IJ SOAJ injection Inject 0.3 mg into the muscle as needed for anaphylaxis.   levothyroxine (SYNTHROID) 88 MCG tablet Take 1 tablet (88 mcg total) by mouth daily.   melatonin 5 MG TABS Take 5 mg by mouth.   memantine (NAMENDA) 5 MG tablet Take 1 tablet (5 mg total) by mouth 2 (two) times daily.   Multiple Vitamin (MULTIVITAMIN) tablet Take 1 tablet by mouth daily.   OVER THE COUNTER MEDICATION Take 1 tablet by mouth daily. magnesium   pantoprazole (PROTONIX) 40 MG tablet Take 1 tablet (40 mg total) by mouth daily.   PARoxetine (PAXIL) 40 MG tablet Take 40 mg by mouth every morning.   polyethylene glycol (MIRALAX / GLYCOLAX) 17 g packet Take 17 g by mouth daily as needed for mild constipation.   polyethylene glycol (MIRALAX / GLYCOLAX) 17 g packet Take 17 g by mouth 2 (two) times daily.   rosuvastatin (CRESTOR) 5 MG tablet TAKE 1 TABLET BY MOUTH DAILY. HOLD CRESTOR WHILE TAKING PAXLOVID   No facility-administered encounter medications on file as of 04/27/2023.    Allergies (verified) Aleve [naproxen], Other, Epinephrine, Fire ant (solenopsis  invicta), and Influenza vac split quad   History: Past Medical History:  Diagnosis Date   ANXIETY 02/12/2008   Qualifier: Diagnosis of  By: Jonny Ruiz MD, Len Blalock    CVA (cerebral vascular accident) [facility])    DEPRESSION 02/12/2008   Qualifier: Diagnosis of  By: Maris Berger    HYPERLIPIDEMIA 02/12/2008   Qualifier: Diagnosis of  By: Maris Berger    HYPERTENSION 02/12/2008   Qualifier: Diagnosis of  By: Maris Berger    HYPOTHYROIDISM 02/12/2008   Qualifier: Diagnosis of  By: Maris Berger    Impaired glucose tolerance 08/27/2011   Left hemiparesis (HCC)    OSTEOPENIA 02/12/2008   Qualifier: Diagnosis of  By: Jonny Ruiz MD, Len Blalock     VITAMIN D DEFICIENCY 04/14/2010   Qualifier: Diagnosis of  By: Jonny Ruiz MD, Len Blalock    Past Surgical History:  Procedure Laterality Date   IR RADIOLOGIST EVAL & MGMT  08/18/2021   Family History  Problem Relation Age of Onset   Heart disease Father    Bipolar disorder Sister    Diabetes Neg Hx    Social History   Socioeconomic History   Marital status: Married    Spouse name: Not on file   Number of children: 0   Years of education: Not on file   Highest education level: Not on file  Occupational History   Occupation: disabled  Tobacco Use   Smoking status: Former    Current packs/day: 0.00    Types: Cigarettes    Quit date: 01/26/2021    Years since quitting: 2.2   Smokeless tobacco: Never  Vaping Use   Vaping status: Never Used  Substance and Sexual Activity   Alcohol use: Not Currently   Drug use: No   Sexual activity: Not on file  Other Topics Concern   Not on file  Social History Narrative   Not on file   Social Determinants of Health   Financial Resource Strain: Low Risk  (04/27/2023)   Overall Financial Resource Strain (CARDIA)    Difficulty of Paying Living Expenses: Not hard at all  Food Insecurity: No Food Insecurity (04/27/2023)   Hunger Vital Sign    Worried About Running Out of Food in the Last Year: Never true    Ran Out of Food in the Last Year: Never true  Transportation Needs: No Transportation Needs (04/27/2023)   PRAPARE - Administrator, Civil Service (Medical): No    Lack of Transportation (Non-Medical): No  Physical Activity: Inactive (04/27/2023)   Exercise Vital Sign    Days of Exercise per Week: 0 days    Minutes of Exercise per Session: 0 min  Stress: Stress Concern Present (04/27/2023)   [employer] of Occupational Health - Occupational Stress Questionnaire    Feeling of Stress : To some extent  Social Connections: Socially Isolated (04/27/2023)   Social Connection and Isolation Panel [NHANES]    Frequency of  Communication with Friends and Family: Never    Frequency of Social Gatherings with Friends and Family: Never    Attends Religious Services: Never    Database administrator or Organizations: No    Attends Engineer, structural: Never    Marital Status: Married    Tobacco Counseling Counseling given: Not Answered   Clinical Intake:  Pre-visit preparation completed: Yes  Pain : No/denies pain     BMI - recorded: 25.61 Nutritional Risks: None Diabetes: No  How often do you need  to have someone help you when you read instructions, pamphlets, or other written materials from your doctor or pharmacy?: 5 - Always (Husband does it for her)  Interpreter Needed?: No  Information entered by :: Archita Lomeli, RMA   Activities of Daily Living    04/27/2023   10:29 AM  In your present state of health, do you have any difficulty performing the following activities:  Hearing? 0  Vision? 0  Difficulty concentrating or making decisions? 0  Comment Per husband-she does not remember alot of things  Walking or climbing stairs? 1  Dressing or bathing? 1  Comment Has an aide to help her.  Doing errands, shopping? 0  Comment Husband takes her around.  Preparing Food and eating ? Y  Using the Toilet? Y  In the past six months, have you accidently leaked urine? Y  Do you have problems with loss of bowel control? Y  Managing your Medications? Y  Managing your Finances? Y  Housekeeping or managing your Housekeeping? Y    Patient Care Team: Corwin Levins, MD as PCP - General (Internal Medicine)  Indicate any recent Medical Services you may have received from other than [facility] providers in the past year (date may be approximate).     Assessment:   This is a routine wellness examination for Susan Davidson.  Hearing/Vision screen Hearing Screening - Comments:: Denies hearing difficulties   Vision Screening - Comments:: Wears eyeglasses  Dietary issues and exercise activities  discussed:     Goals Addressed   None   Depression Screen    04/27/2023   10:49 AM 04/10/2023    9:11 AM 10/10/2022    3:04 PM 10/10/2022    2:34 PM 07/08/2022   10:58 AM 04/25/2022    4:11 PM 10/29/2021    3:07 PM  PHQ 2/9 Scores  PHQ - 2 Score 5 0 1 0  3 1  PHQ- 9 Score 19     9   Exception Documentation     Patient refusal      Fall Risk    04/27/2023   10:46 AM 04/10/2023    9:11 AM 10/10/2022    3:03 PM 10/10/2022    2:34 PM 07/08/2022   10:58 AM  Fall Risk   Falls in the past year? 0 0 0 0 0  Number falls in past yr: 0 0 0    Injury with Fall? 0 0 0 0   Risk for fall due to : No Fall Risks No Fall Risks  No Fall Risks No Fall Risks  Follow up Falls prevention discussed Falls evaluation completed  Falls evaluation completed Falls evaluation completed    MEDICARE RISK AT HOME:  Medicare Risk at Home - 04/27/23 1046     Any stairs in or around the home? No    Home free of loose throw rugs in walkways, pet beds, electrical cords, etc? Yes    Adequate lighting in your home to reduce risk of falls? Yes    Life alert? Yes    Use of a cane, walker or w/c? Yes   W/C   Grab bars in the bathroom? No    Shower chair or bench in shower? No    Elevated toilet seat or a handicapped toilet? No             TIMED UP AND GO:  Was the test performed? Yes  Length of time to ambulate 10 feet: 20 sec Gait slow and steady with assistive  device    Cognitive Function:        Immunizations Immunization History  Administered Date(s) Administered   Influenza-Unspecified 08/18/2021   Moderna Sars-Covid-2 Vaccination 12/02/2019   Tdap 10/19/2012    TDAP status: Due, Education has been provided regarding the importance of this vaccine. Advised may receive this vaccine at local pharmacy or Health Dept. Aware to provide a copy of the vaccination record if obtained from local pharmacy or Health Dept. Verbalized acceptance and understanding.  Flu Vaccine status: Due, Education has been  provided regarding the importance of this vaccine. Advised may receive this vaccine at local pharmacy or Health Dept. Aware to provide a copy of the vaccination record if obtained from local pharmacy or Health Dept. Verbalized acceptance and understanding.  Pneumococcal vaccine status: Due, Education has been provided regarding the importance of this vaccine. Advised may receive this vaccine at local pharmacy or Health Dept. Aware to provide a copy of the vaccination record if obtained from local pharmacy or Health Dept. Verbalized acceptance and understanding.  Covid-19 vaccine status: Completed vaccines  Qualifies for Shingles Vaccine? Yes   Zostavax completed No   Shingrix Completed?: No.    Education has been provided regarding the importance of this vaccine. Patient has been advised to call insurance company to determine out of pocket expense if they have not yet received this vaccine. Advised may also receive vaccine at local pharmacy or Health Dept. Verbalized acceptance and understanding.  Screening Tests Health Maintenance  Topic Date Due   Zoster Vaccines- Shingrix (1 of 2) Never done   Colonoscopy  Never done   Pneumonia Vaccine 67+ Years old (1 of 1 - PCV) Never done   DEXA SCAN  Never done   DTaP/Tdap/Td (2 - Td or Tdap) 10/19/2022   MAMMOGRAM  04/09/2024 (Originally 08/30/2012)   INFLUENZA VACCINE  05/04/2023   Medicare Annual Wellness (AWV)  04/26/2024   Hepatitis C Screening  Completed   HPV VACCINES  Aged Out   COVID-19 Vaccine  Discontinued    Health Maintenance  Health Maintenance Due  Topic Date Due   Zoster Vaccines- Shingrix (1 of 2) Never done   Colonoscopy  Never done   Pneumonia Vaccine 11+ Years old (1 of 1 - PCV) Never done   DEXA SCAN  Never done   DTaP/Tdap/Td (2 - Td or Tdap) 10/19/2022    Colorectal cancer screening: Referral to GI placed Cologuard. Pt aware the office will call re: appt.  Mammogram status: No longer required due to mobility and  age.  Bone Density status: Ordered 04/10/2023. Pt provided with contact info and advised to call to schedule appt.  Lung Cancer Screening: (Low Dose CT Chest recommended if Age 18-80 years, 20 pack-year currently smoking OR have quit w/in 15years.) does not qualify.   Lung Cancer Screening Referral: N/A  Additional Screening:  Hepatitis C Screening: does qualify; Completed 11/02/2018  Vision Screening: Recommended annual ophthalmology exams for early detection of glaucoma and other disorders of the eye. Is the patient up to date with their annual eye exam?  No  Who is the provider or what is the name of the office in which the patient attends annual eye exams? N/A If pt is not established with a provider, would they like to be referred to a provider to establish care? Yes .   Dental Screening: Recommended annual dental exams for proper oral hygiene   Community Resource Referral / Chronic Care Management: CRR required this visit?  No  CCM required this visit?  No     Plan:     I have personally reviewed and noted the following in the patient's chart:   Medical and social history Use of alcohol, tobacco or illicit drugs  Current medications and supplements including opioid prescriptions. Patient is not currently taking opioid prescriptions. Functional ability and status Nutritional status Physical activity Advanced directives List of other physicians Hospitalizations, surgeries, and ER visits in previous 12 months Vitals Screenings to include cognitive, depression, and falls Referrals and appointments  In addition, I have reviewed and discussed with patient certain preventive protocols, quality metrics, and best practice recommendations. A written personalized care plan for preventive services as well as general preventive health recommendations were provided to patient.     Kayce Chismar L Donyea Gafford, CMA   04/27/2023   After Visit Summary: Print out for today.  Nurse Notes:  Patient came in today in a wheelchair with her husband.  Patient seemed down, as she could not keep her head up to answer asked questions.  Her husband did most of the answering for her.  I placed a referral for the cologuard as she never has had a colonoscopy.  Her husband informed me that the patient could not be stretched out for the colonoscopy to be performed.  I also gave patient's husband the number to call for Bone density to be scheduled.  Dr. Jonny Ruiz has already placed a referral for it.

## 2023-04-27 NOTE — Patient Instructions (Signed)
Susan Davidson , Thank you for taking time to come for your Medicare Wellness Visit. I appreciate your ongoing commitment to your health goals. Please review the following plan we discussed and let me know if I can assist you in the future.   Referrals/Orders/Follow-Ups/Clinician Recommendations: Tdap, Shingrix and Pneu vaccines are all due.  Patient will discuss with PCP about vaccines, as she is aware of having them done at pharmacy.  Remember to call and get scheduled for bone density screening. You should receive Cologuard kit soon.   This is a list of the screening recommended for you and due dates:  Health Maintenance  Topic Date Due   Zoster (Shingles) Vaccine (1 of 2) Never done   Colon Cancer Screening  Never done   Pneumonia Vaccine (1 of 1 - PCV) Never done   DEXA scan (bone density measurement)  Never done   DTaP/Tdap/Td vaccine (2 - Td or Tdap) 10/19/2022   Mammogram  04/09/2024*   Flu Shot  05/04/2023   Medicare Annual Wellness Visit  04/26/2024   Hepatitis C Screening  Completed   HPV Vaccine  Aged Out   COVID-19 Vaccine  Discontinued  *Topic was postponed. The date shown is not the original due date.    Advanced directives: (Provided) Advance directive discussed with you today. I have provided a copy for you to complete at home and have notarized. Once this is complete, please bring a copy in to our office so we can scan it into your chart.   Next Medicare Annual Wellness Visit scheduled for next year: Yes  Preventive Care 74 Years and Older, Female Preventive care refers to lifestyle choices and visits with your health care provider that can promote health and wellness. What does preventive care include? A yearly physical exam. This is also called an annual well check. Dental exams once or twice a year. Routine eye exams. Ask your health care provider how often you should have your eyes checked. Personal lifestyle choices, including: Daily care of your teeth and  gums. Regular physical activity. Eating a healthy diet. Avoiding tobacco and drug use. Limiting alcohol use. Practicing safe sex. Taking low-dose aspirin every day. Taking vitamin and mineral supplements as recommended by your health care provider. What happens during an annual well check? The services and screenings done by your health care provider during your annual well check will depend on your age, overall health, lifestyle risk factors, and family history of disease. Counseling  Your health care provider may ask you questions about your: Alcohol use. Tobacco use. Drug use. Emotional well-being. Home and relationship well-being. Sexual activity. Eating habits. History of falls. Memory and ability to understand (cognition). Work and work Astronomer. Reproductive health. Screening  You may have the following tests or measurements: Height, weight, and BMI. Blood pressure. Lipid and cholesterol levels. These may be checked every 5 years, or more frequently if you are over 3 years old. Skin check. Lung cancer screening. You may have this screening every year starting at age 74 if you have a 30-pack-year history of smoking and currently smoke or have quit within the past 15 years. Fecal occult blood test (FOBT) of the stool. You may have this test every year starting at age 74. Flexible sigmoidoscopy or colonoscopy. You may have a sigmoidoscopy every 5 years or a colonoscopy every 10 years starting at age 74. Hepatitis C blood test. Hepatitis B blood test. Sexually transmitted disease (STD) testing. Diabetes screening. This is done by checking your blood  sugar (glucose) after you have not eaten for a while (fasting). You may have this done every 1-3 years. Bone density scan. This is done to screen for osteoporosis. You may have this done starting at age 74. Mammogram. This may be done every 1-2 years. Talk to your health care provider about how often you should have regular  mammograms. Talk with your health care provider about your test results, treatment options, and if necessary, the need for more tests. Vaccines  Your health care provider may recommend certain vaccines, such as: Influenza vaccine. This is recommended every year. Tetanus, diphtheria, and acellular pertussis (Tdap, Td) vaccine. You may need a Td booster every 10 years. Zoster vaccine. You may need this after age 79. Pneumococcal 13-valent conjugate (PCV13) vaccine. One dose is recommended after age 15. Pneumococcal polysaccharide (PPSV23) vaccine. One dose is recommended after age 49. Talk to your health care provider about which screenings and vaccines you need and how often you need them. This information is not intended to replace advice given to you by your health care provider. Make sure you discuss any questions you have with your health care provider. Document Released: 10/16/2015 Document Revised: 06/08/2016 Document Reviewed: 07/21/2015 Elsevier Interactive Patient Education  2017 [employer].  Fall Prevention in the Home Falls can cause injuries. They can happen to people of all ages. There are many things you can do to make your home safe and to help prevent falls. What can I do on the outside of my home? Regularly fix the edges of walkways and driveways and fix any cracks. Remove anything that might make you trip as you walk through a door, such as a raised step or threshold. Trim any bushes or trees on the path to your home. Use bright outdoor lighting. Clear any walking paths of anything that might make someone trip, such as rocks or tools. Regularly check to see if handrails are loose or broken. Make sure that both sides of any steps have handrails. Any raised decks and porches should have guardrails on the edges. Have any leaves, snow, or ice cleared regularly. Use sand or salt on walking paths during winter. Clean up any spills in your garage right away. This includes oil  or grease spills. What can I do in the bathroom? Use night lights. Install grab bars by the toilet and in the tub and shower. Do not use towel bars as grab bars. Use non-skid mats or decals in the tub or shower. If you need to sit down in the shower, use a plastic, non-slip stool. Keep the floor dry. Clean up any water that spills on the floor as soon as it happens. Remove soap buildup in the tub or shower regularly. Attach bath mats securely with double-sided non-slip rug tape. Do not have throw rugs and other things on the floor that can make you trip. What can I do in the bedroom? Use night lights. Make sure that you have a light by your bed that is easy to reach. Do not use any sheets or blankets that are too big for your bed. They should not hang down onto the floor. Have a firm chair that has side arms. You can use this for support while you get dressed. Do not have throw rugs and other things on the floor that can make you trip. What can I do in the kitchen? Clean up any spills right away. Avoid walking on wet floors. Keep items that you use a lot in easy-to-reach  places. If you need to reach something above you, use a strong step stool that has a grab bar. Keep electrical cords out of the way. Do not use floor polish or wax that makes floors slippery. If you must use wax, use non-skid floor wax. Do not have throw rugs and other things on the floor that can make you trip. What can I do with my stairs? Do not leave any items on the stairs. Make sure that there are handrails on both sides of the stairs and use them. Fix handrails that are broken or loose. Make sure that handrails are as long as the stairways. Check any carpeting to make sure that it is firmly attached to the stairs. Fix any carpet that is loose or worn. Avoid having throw rugs at the top or bottom of the stairs. If you do have throw rugs, attach them to the floor with carpet tape. Make sure that you have a light  switch at the top of the stairs and the bottom of the stairs. If you do not have them, ask someone to add them for you. What else can I do to help prevent falls? Wear shoes that: Do not have high heels. Have rubber bottoms. Are comfortable and fit you well. Are closed at the toe. Do not wear sandals. If you use a stepladder: Make sure that it is fully opened. Do not climb a closed stepladder. Make sure that both sides of the stepladder are locked into place. Ask someone to hold it for you, if possible. Clearly mark and make sure that you can see: Any grab bars or handrails. First and last steps. Where the edge of each step is. Use tools that help you move around (mobility aids) if they are needed. These include: Canes. Walkers. Scooters. Crutches. Turn on the lights when you go into a dark area. Replace any light bulbs as soon as they burn out. Set up your furniture so you have a clear path. Avoid moving your furniture around. If any of your floors are uneven, fix them. If there are any pets around you, be aware of where they are. Review your medicines with your doctor. Some medicines can make you feel dizzy. This can increase your chance of falling. Ask your doctor what other things that you can do to help prevent falls. This information is not intended to replace advice given to you by your health care provider. Make sure you discuss any questions you have with your health care provider. Document Released: 07/16/2009 Document Revised: 02/25/2016 Document Reviewed: 10/24/2014 Elsevier Interactive Patient Education  2017 [employer].

## 2023-04-29 DIAGNOSIS — R0609 Other forms of dyspnea: Secondary | ICD-10-CM | POA: Diagnosis not present

## 2023-05-02 ENCOUNTER — Ambulatory Visit (HOSPITAL_COMMUNITY): Payer: Medicare HMO | Attending: Neurosurgery | Admitting: Physical Therapy

## 2023-05-02 DIAGNOSIS — M542 Cervicalgia: Secondary | ICD-10-CM | POA: Diagnosis not present

## 2023-05-02 DIAGNOSIS — M6281 Muscle weakness (generalized): Secondary | ICD-10-CM | POA: Diagnosis not present

## 2023-05-02 NOTE — Therapy (Signed)
OUTPATIENT PHYSICAL THERAPY CERVICAL EVALUATION   Patient Name: Susan Davidson MRN: [id number] DOB:09-Sep-1951, 72 y.o., female Today's Date: 05/02/2023  END OF SESSION:  PT End of Session - 05/02/23 1556     Visit Number 1    Number of Visits 8    Date for PT Re-Evaluation 06/13/23    Authorization Type humana    PT Start Time 1430    PT Stop Time 1510    PT Time Calculation (min) 40 min    Activity Tolerance Patient tolerated treatment well    Behavior During Therapy WFL for tasks assessed/performed             Past Medical History:  Diagnosis Date   ANXIETY 02/12/2008   Qualifier: Diagnosis of  By: Jonny Ruiz MD, Len Blalock    CVA (cerebral vascular accident) [facility])    DEPRESSION 02/12/2008   Qualifier: Diagnosis of  By: Maris Berger    HYPERLIPIDEMIA 02/12/2008   Qualifier: Diagnosis of  By: Maris Berger    HYPERTENSION 02/12/2008   Qualifier: Diagnosis of  By: Maris Berger    HYPOTHYROIDISM 02/12/2008   Qualifier: Diagnosis of  By: Maris Berger    Impaired glucose tolerance 08/27/2011   Left hemiparesis (HCC)    OSTEOPENIA 02/12/2008   Qualifier: Diagnosis of  By: Jonny Ruiz MD, Len Blalock    VITAMIN D DEFICIENCY 04/14/2010   Qualifier: Diagnosis of  By: Jonny Ruiz MD, Len Blalock    Past Surgical History:  Procedure Laterality Date   IR RADIOLOGIST EVAL & MGMT  08/18/2021   Patient Active Problem List   Diagnosis Date Noted   Epigastric pain 04/11/2023   Aneurysm (HCC) 12/12/2022   Left arm weakness 12/12/2022   Confusion 01/08/2022   Bipolar illness (HCC) 01/06/2022   Gross hematuria 11/29/2021   Bilateral hearing loss 10/29/2021   History of stroke 10/29/2021   Nail disorder 10/29/2021   Unresponsiveness 08/02/2021   Acute metabolic encephalopathy 05/04/2021   CKD (chronic kidney disease) stage 3, GFR 30-59 ml/min (HCC) 05/04/2021   COVID-19 virus infection 05/04/2021   Lower urinary tract infectious disease    Generalized  weakness 01/27/2021   Spinal stenosis at L4-L5 level 01/27/2021   CVA (cerebral vascular accident) (HCC) 01/27/2021   Ambulatory dysfunction 01/26/2021   Tobacco use disorder 01/26/2021   CKD (chronic kidney disease) 11/15/2020   B12 deficiency 11/09/2020   Urinary frequency 11/02/2018   Daytime somnolence 10/19/2012   Impaired glucose tolerance 08/27/2011   Encounter for well adult exam with abnormal findings 08/27/2011   Vitamin D deficiency 04/14/2010   HYPERSOMNIA 02/19/2009   Hypothyroidism 02/12/2008   HLD (hyperlipidemia) 02/12/2008   Anxiety state 02/12/2008   Depression 02/12/2008   Essential hypertension 02/12/2008   Osteopenia 02/12/2008    PCP: Oliver Barre  REFERRING PROVIDER: Lisbeth Renshaw, MD  REFERRING DIAG: M50.90 (ICD-10-CM) - Cervical disc disorder, unspecified, unspecified cervical region  THERAPY DIAG:  Cervical radiculopathy  Rationale for Evaluation and Treatment: Rehabilitation  ONSET DATE: 11/17/22  SUBJECTIVE STATEMENT: Susan Davidson states that she started having neck pain about six months ago.  The pain comes and goes but is mostly in the evenings.  Both her Right and left side hurt equally. The pain is going into her shoulders but not into her arms.  Hand dominance: Right  PERTINENT HISTORY:  CVA,  CKD   PAIN:  Are you having pain? Yes: NPRS scale: 0/10, worst pain 9/10  Pain location: B cervical  Pain description: aching  Aggravating factors: evening  Relieving factors: medication   PRECAUTIONS: None      WEIGHT BEARING RESTRICTIONS: No  FALLS:  Has patient fallen in last 6 months? No   OCCUPATION: retired  PLOF:  typically pt is in bed all day long.   PATIENT GOALS: less pain   NEXT MD VISIT: unknown   OBJECTIVE:   DIAGNOSTIC  FINDINGS:     IMPRESSION: This MRI of the cervical spine without contrast shows the following: The spinal cord appears normal. At C3-C4, there are degenerative changes causing moderately severe left foraminal narrowing but no spinal stenosis.  There is potential for left C4 nerve root compression. At C4-C5 and C5-C6 and C6-C7, degenerative changes cause mild spinal stenosis and moderate foraminal narrowing.  There does not appear to be nerve root compression.       INTERPRETING PHYSICIAN:  Richard A. Epimenio Foot, MD, PhD, FAAN Certified in  Neuroimaging by [employer]    PATIENT SURVEYS:  FOTO 46  COGNITION: Overall cognitive status: Within functional limits for tasks assessed  SENSATION: WFL  POSTURE: rounded shoulders, forward head, decreased lumbar lordosis, and increased thoracic kyphosis  PALPATION: Jt tightness more than mm   CERVICAL ROM:   Active ROM A/PROM (deg) eval  Flexion 25  Extension 30  Right lateral flexion 10  Left lateral flexion 10  Right rotation 35  Left rotation 38   (Blank rows = not tested) Isometric :  RT SB: 2 , LT SB 2 ; extension 2  UPPER EXTREMITY ROM: Lt is limited secondary to prior CVA     TODAY'S TREATMENT:                                                                                                                              DATE: 05/02/23 evaluation Sitting  Tall sitting Cervical rotation x 10 Cervical side bend x 5 Scapular retraction x 5    PATIENT EDUCATION:  Education details: HEP Person educated: Patient Education method: Explanation, Actor cues, Verbal cues, and Handouts Education comprehension: returned demonstration and needs further education  HOME EXERCISE PROGRAM: Access Code: [email or web address] URL: [email or web address] Date: 05/02/2023 Prepared by: Virgina Organ  Exercises - Seated Scapular Retraction  - 2 x daily - 7 x weekly - 1 sets - 10 reps - 3-5" hold - Supine Cervical  Rotation AROM on Pillow  - 2 x daily - 7 x weekly - 1 sets - 10 reps - 3-5" hold -  Supine Scapular Retraction  - 2 x daily - 7 x weekly - 1 sets - 10 reps - 3-5" hold - Beginner Head Nod  - 2 x daily - 7 x weekly - 1 sets - 10 reps - 3-5" hold  ASSESSMENT:  CLINICAL IMPRESSION: Patient is a 72 y.o. female  who was seen today for physical therapy evaluation and treatment for cervical pain.  The pt and husband states pt spends most of her time in the bed.  Evaluation demonstrates poor posture, decreased ROM, decreased strength and increased pain.  Ms. Rajski will benefit from skilled PT to address these issues and maximize her functional level.   OBJECTIVE IMPAIRMENTS: decreased ROM, decreased strength, hypomobility, increased fascial restrictions, postural dysfunction, and pain.   ACTIVITY LIMITATIONS: sleeping and reach over head   PERSONAL FACTORS: Age, Fitness, Time since onset of injury/illness/exacerbation, and 1 comorbidity: CVA  are also affecting patient's functional outcome.   REHAB POTENTIAL: Good  CLINICAL DECISION MAKING: Stable/uncomplicated  EVALUATION COMPLEXITY: Low   GOALS: Goals reviewed with patient? No  SHORT TERM GOALS: Target date: 05/29/23-unable to get in right away  Pt to be I in HEP in order to decrease pain to no greater than a 4/10 Baseline:  Goal status: INITIAL  2.  Pt to be able to rotate her head at least 45 degrees to allow scanning of her environment Baseline:  Goal status: INITIAL    LONG TERM GOALS: Target date: 06/16/23  Pt to be I in advanced HEP in order to decrease pain to no greater than a 1/10 Baseline:  Goal status: INITIAL  2.  Pt to be able to rotate her head at least 55 degrees to allow scanning of her environment Baseline:  Goal status: INITIAL  3.  PT cervical mm to improve to at least 3/5 to allow pt to keep her head up to reduce strain on cervical area.  Baseline:  Goal status: INITIAL   PLAN:  PT FREQUENCY:  2x/week  PT DURATION: 6 weeks  PLANNED INTERVENTIONS: Therapeutic exercises, Therapeutic activity, Balance training, Gait training, Patient/Family education, Self Care, Joint mobilization, and Manual therapy  PLAN FOR NEXT SESSION: begin gentle jt mob, isometric exercises, give isometric for HEP   Virgina Organ, PT CLT [phone]  05/02/2023, 3:56 PM

## 2023-05-04 ENCOUNTER — Encounter: Payer: Self-pay | Admitting: Adult Health

## 2023-05-04 ENCOUNTER — Ambulatory Visit: Payer: Medicare HMO | Admitting: Adult Health

## 2023-05-04 NOTE — Progress Notes (Deleted)
No chief complaint on file.     ASSESSMENT AND PLAN  Susan Davidson is a 72 y.o. 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, with residual left lower extremity more than left upper extremity weakness, Known history of anterior communicating 5 mm aneurysm  Slow worsening functional status, new development of left upper extremity fixed contraction of left elbow, left hand intrinsic muscle atrophy, weakness, abnormal neck posturing, brisk reflexes,  MRI of cervical spine possible left C4 pinched nerve -following with neurosurgery  MRA of brain and neck stable known aneurysms -following with neurosurgery  She is on double antiplatelet agent aspirin plus Plavix 75 mg, will stop aspirin, single agent Plavix alone  Return to clinic with nurse practitioner in 4 to 6 months  DIAGNOSTIC DATA (LABS, IMAGING, TESTING) - I reviewed patient records, labs, notes, testing and imaging myself where available.   MEDICAL HISTORY:  Update 05/04/2023 JM: Patient returns for follow-up visit.  MRI cervical spine showed degenerative changes and possible left C4 pinched nerve, upon providing results patient complained of neck pain ongoing over the past 2 months, was referred to neurosurgery for further evaluation  MRA neck left ICA 35% stenosis and right ICA 20% stenosis, no change since prior imaging in 2022 MRA head showed 5 mm saccular aneurysm of ACA and 1.5 mm outpouching at tip of basilar artery possible small aneurysm arterial infundibulum, no changes on either since 2022 imaging.  Noted reduced flow at P1 segment and moderate stenosis at P2 at left PCA noted on prior imaging    Consult visit 12/12/2022 Dr. Terrace Arabia: Susan Davidson, is a 72 year old female, accompanied by her husband seen in request by her primary care Dr. Oliver Barre for evaluation of stroke, previously seen by Dr. Beryle Beams, MD, initial evaluation was with her husband on December 12, 2022  I reviewed and summarized the  referring note.  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, 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, CT angiogram of head and neck confirmed 5 mm anterior communicating artery aneurysm Echocardiogram showed no significant abnormality  She had a prolonged rehabilitation stay, also multiple hospital readmission for mental status change, not eating well, COVID infection, in 2022, required prolonged nursing facility stating, now she is back home, has personal aide coming 9 AM to 6 PM, husband still go to work  She spent most of the time in wheelchair or lying down, feeling very depressed, poor appetite, sleepy, wants to follow-up for her stroke,  Over the past couple years, she also developed slow contraction of left elbow, left hands, especially left fifth fingers, neck pain, abnormal neck posturing, urinary incontinence     PHYSICAL EXAM:   There were no vitals filed for this visit.  Not recorded     There is no height or weight on file to calculate BMI.  PHYSICAL EXAMNIATION:  Gen: NAD, conversant, well nourised, well groomed  Cardiovascular: Regular rate rhythm, no peripheral edema, warm, nontender. Eyes: Conjunctivae clear without exudates or hemorrhage Neck: Moderate right shoulder elevation, right turn, limited range of motion of neck, Pulmonary: Clear to auscultation bilaterally    NEUROLOGICAL EXAM:  MENTAL STATUS: Speech/cognition: Depressed looking unkept elderly female, cooperative on examinations  CRANIAL NERVES: CN II: Left hemivisual field deficit,. Pupils are round equal and briskly reactive to light. CN III, IV, VI: extraocular movement are normal. No ptosis. CN V: Facial sensation is intact to light touch CN VII: Face is symmetric with normal eye closure  CN VIII: Hearing is normal to causal conversation. CN IX, X: Phonation is normal. CN XI: Head turning and shoulder shrug are intact  MOTOR: Significant atrophy of left hand, antigravity movement of left upper extremity, weak grip on left hand, contraction of left elbow extended maximum 150 degree, right lower extremity has antigravity movement, barely antigravity movement of left lower extremity  REFLEXES: Brisk bilateral upper and lower extremity reflex, worsening on the left side, left Babinski sign,  SENSORY: Mild length-dependent sensory changes  COORDINATION: There is no trunk or limb dysmetria noted.  GAIT/STANCE: Deferred  REVIEW OF SYSTEMS:  Full 14 system review of systems performed and notable only for as above All other review of systems were negative.   ALLERGIES: Allergies  Allergen Reactions   Aleve [Naproxen] Nausea Only   Other Anaphylaxis    Fire ant venom   Epinephrine    Animator (Solenopsis [place])    Influenza Vac Split Quad     fatigue    HOME MEDICATIONS: Current Outpatient Medications  Medication Sig Dispense Refill   acetaminophen (TYLENOL) 325 MG tablet Take 2 tablets (650 mg total) by mouth every 6 (six) hours as needed for mild pain or headache (fever >/= 101). 12 tablet 2   ALPRAZolam (XANAX) 0.5 MG tablet Take 1 tablet (0.5 mg total) by mouth 2 (two) times daily as needed for anxiety. 60 tablet 2   ascorbic acid (VITAMIN C) 500 MG tablet Take 1 tablet (500 mg total) by mouth daily. 30 tablet 2   clopidogrel (PLAVIX) 75 MG tablet Take 1 tablet (75 mg  total) by mouth daily. 90 tablet 3   divalproex (DEPAKOTE) 250 MG DR tablet Take 1 tablet by mouth See admin instructions. 1 tablet in the morning and 2 tablets every evening     EPINEPHrine 0.3 mg/0.3 mL IJ SOAJ injection Inject 0.3 mg into the muscle as needed for anaphylaxis. 1 each 2   levothyroxine (SYNTHROID) 88 MCG tablet Take 1 tablet (88 mcg total) by mouth daily. 90 tablet 1   melatonin 5 MG TABS Take 5 mg by mouth.     memantine (NAMENDA) 5 MG tablet Take 1 tablet (5 mg total) by mouth 2 (two) times daily. 180 tablet 3   Multiple Vitamin (MULTIVITAMIN) tablet Take 1 tablet by mouth daily.     OVER THE COUNTER MEDICATION Take 1 tablet by mouth daily. magnesium     pantoprazole (PROTONIX) 40 MG tablet Take 1 tablet (40 mg total) by mouth daily. 30 tablet 11   PARoxetine (PAXIL) 40 MG tablet Take 40 mg by mouth every morning.     polyethylene glycol (MIRALAX / GLYCOLAX) 17 g packet Take 17 g by mouth daily as needed for mild constipation. 14 each 0   polyethylene glycol (MIRALAX / GLYCOLAX) 17 g packet Take 17 g by mouth 2 (two) times daily. 60 packet 2   rosuvastatin (CRESTOR) 5 MG tablet TAKE  1 TABLET BY MOUTH DAILY. HOLD CRESTOR WHILE TAKING PAXLOVID 30 tablet 8   No current facility-administered medications for this visit.    PAST MEDICAL HISTORY: Past Medical History:  Diagnosis Date   ANXIETY 02/12/2008   Qualifier: Diagnosis of  By: Jonny Ruiz MD, Len Blalock    CVA (cerebral vascular accident) [facility])    DEPRESSION 02/12/2008   Qualifier: Diagnosis of  By: Maris Berger    HYPERLIPIDEMIA 02/12/2008   Qualifier: Diagnosis of  By: Maris Berger    HYPERTENSION 02/12/2008   Qualifier: Diagnosis of  By: Maris Berger    HYPOTHYROIDISM 02/12/2008   Qualifier: Diagnosis of  By: Maris Berger    Impaired glucose tolerance 08/27/2011   Left hemiparesis (HCC)    OSTEOPENIA 02/12/2008   Qualifier: Diagnosis of  By: Jonny Ruiz MD, Len Blalock    VITAMIN D  DEFICIENCY 04/14/2010   Qualifier: Diagnosis of  By: Jonny Ruiz MD, Len Blalock     PAST SURGICAL HISTORY: Past Surgical History:  Procedure Laterality Date   IR RADIOLOGIST EVAL & MGMT  08/18/2021    FAMILY HISTORY: Family History  Problem Relation Age of Onset   Heart disease Father    Bipolar disorder Sister    Diabetes Neg Hx     SOCIAL HISTORY: Social History   Socioeconomic History   Marital status: Married    Spouse name: Not on file   Number of children: 0   Years of education: Not on file   Highest education level: Not on file  Occupational History   Occupation: disabled  Tobacco Use   Smoking status: Former    Current packs/day: 0.00    Types: Cigarettes    Quit date: 01/26/2021    Years since quitting: 2.2   Smokeless tobacco: Never  Vaping Use   Vaping status: Never Used  Substance and Sexual Activity   Alcohol use: Not Currently   Drug use: No   Sexual activity: Not on file  Other Topics Concern   Not on file  Social History Narrative   Not on file   Social Determinants of Health   Financial Resource Strain: Low Risk  (04/27/2023)   Overall Financial Resource Strain (CARDIA)    Difficulty of Paying Living Expenses: Not hard at all  Food Insecurity: No Food Insecurity (04/27/2023)   Hunger Vital Sign    Worried About Running Out of Food in the Last Year: Never true    Ran Out of Food in the Last Year: Never true  Transportation Needs: No Transportation Needs (04/27/2023)   PRAPARE - Administrator, Civil Service (Medical): No    Lack of Transportation (Non-Medical): No  Physical Activity: Inactive (04/27/2023)   Exercise Vital Sign    Days of Exercise per Week: 0 days    Minutes of Exercise per Session: 0 min  Stress: Stress Concern Present (04/27/2023)   [employer] of Occupational Health - Occupational Stress Questionnaire    Feeling of Stress : To some extent  Social Connections: Socially Isolated (04/27/2023)   Social Connection and  Isolation Panel [NHANES]    Frequency of Communication with Friends and Family: Never    Frequency of Social Gatherings with Friends and Family: Never    Attends Religious Services: Never    Database administrator or Organizations: No    Attends Banker Meetings: Never    Marital Status: Married  Catering manager Violence: Not on file      I  spent *** minutes of face-to-face and non-face-to-face time with patient.  This included previsit chart review, lab review, study review, order entry, electronic health record documentation, patient education and discussion regarding above diagnoses and treatment plan and answered all other questions to patient's satisfaction  Ihor Austin, [facility]  [facility] [address] [place], [place] [address]  Phone [phone] Fax [phone] Note: This document was prepared with digital dictation and possible smart phrase technology. Any transcriptional errors that result from this process are unintentional.

## 2023-05-05 ENCOUNTER — Ambulatory Visit (HOSPITAL_COMMUNITY): Payer: Medicare HMO | Attending: Neurosurgery

## 2023-05-05 ENCOUNTER — Encounter (HOSPITAL_COMMUNITY): Payer: Self-pay

## 2023-05-05 DIAGNOSIS — M542 Cervicalgia: Secondary | ICD-10-CM | POA: Insufficient documentation

## 2023-05-05 DIAGNOSIS — M6281 Muscle weakness (generalized): Secondary | ICD-10-CM | POA: Insufficient documentation

## 2023-05-05 NOTE — Therapy (Signed)
OUTPATIENT PHYSICAL THERAPY CERVICAL EVALUATION   Patient Name: Susan Davidson MRN: [id number] DOB:April 17, 1951, 72 y.o., female Today's Date: 05/05/2023  END OF SESSION: END OF SESSION:   PT End of Session - 05/05/23 1524     Visit Number 2    Number of Visits 8    Date for PT Re-Evaluation 06/13/23    Authorization Type humana    Authorization Time Period cohere approved 8 visits & 1 re-eval from 7/30-->06/16/23    Authorization - Visit Number 1    Authorization - Number of Visits 8    Progress Note Due on Visit 8    PT Start Time 1430    PT Stop Time 1510    PT Time Calculation (min) 40 min    Activity Tolerance Patient tolerated treatment well    Behavior During Therapy [facility] for tasks assessed/performed             Past Medical History:  Diagnosis Date   ANXIETY 02/12/2008   Qualifier: Diagnosis of  By: Jonny Ruiz MD, Len Blalock    CVA (cerebral vascular accident) [facility])    DEPRESSION 02/12/2008   Qualifier: Diagnosis of  By: Maris Berger    HYPERLIPIDEMIA 02/12/2008   Qualifier: Diagnosis of  By: Maris Berger    HYPERTENSION 02/12/2008   Qualifier: Diagnosis of  By: Maris Berger    HYPOTHYROIDISM 02/12/2008   Qualifier: Diagnosis of  By: Maris Berger    Impaired glucose tolerance 08/27/2011   Left hemiparesis (HCC)    OSTEOPENIA 02/12/2008   Qualifier: Diagnosis of  By: Jonny Ruiz MD, Len Blalock    VITAMIN D DEFICIENCY 04/14/2010   Qualifier: Diagnosis of  By: Jonny Ruiz MD, Len Blalock    Past Surgical History:  Procedure Laterality Date   IR RADIOLOGIST EVAL & MGMT  08/18/2021   Patient Active Problem List   Diagnosis Date Noted   Epigastric pain 04/11/2023   Aneurysm (HCC) 12/12/2022   Left arm weakness 12/12/2022   Confusion 01/08/2022   Bipolar illness (HCC) 01/06/2022   Gross hematuria 11/29/2021   Bilateral hearing loss 10/29/2021   History of stroke 10/29/2021   Nail disorder 10/29/2021   Unresponsiveness 08/02/2021   Acute  metabolic encephalopathy 05/04/2021   CKD (chronic kidney disease) stage 3, GFR 30-59 ml/min (HCC) 05/04/2021   COVID-19 virus infection 05/04/2021   Lower urinary tract infectious disease    Generalized weakness 01/27/2021   Spinal stenosis at L4-L5 level 01/27/2021   CVA (cerebral vascular accident) (HCC) 01/27/2021   Ambulatory dysfunction 01/26/2021   Tobacco use disorder 01/26/2021   CKD (chronic kidney disease) 11/15/2020   B12 deficiency 11/09/2020   Urinary frequency 11/02/2018   Daytime somnolence 10/19/2012   Impaired glucose tolerance 08/27/2011   Encounter for well adult exam with abnormal findings 08/27/2011   Vitamin D deficiency 04/14/2010   HYPERSOMNIA 02/19/2009   Hypothyroidism 02/12/2008   HLD (hyperlipidemia) 02/12/2008   Anxiety state 02/12/2008   Depression 02/12/2008   Essential hypertension 02/12/2008   Osteopenia 02/12/2008    PCP: Oliver Barre  REFERRING PROVIDER: Lisbeth Renshaw, MD  REFERRING DIAG: M50.90 (ICD-10-CM) - Cervical disc disorder, unspecified, unspecified cervical region  THERAPY DIAG:  Cervical radiculopathy  Rationale for Evaluation and Treatment: Rehabilitation  ONSET DATE: 11/17/22  SUBJECTIVE STATEMENT: Reports interest in receiving home health due to spouse working during the day and difficulty with transportation to outpatient setting.  Pt stated she is feeling good today, no reports of pain.  Arrived with spouse who spoke of history and reports she has been completing exercises with care giver daily.    Ms. Gardiner states that she started having neck pain about six months ago.  The pain comes and goes but is mostly in the evenings.  Both her Right and left side hurt equally. The pain is going into her shoulders but not into her arms.   Hand dominance: Right  PERTINENT HISTORY:  CVA,  CKD   PAIN:  Are you having pain? Yes: NPRS scale: 0/10, worst pain 9/10  Pain location: B cervical  Pain description: aching  Aggravating factors: evening  Relieving factors: medication   PRECAUTIONS: None      WEIGHT BEARING RESTRICTIONS: No  FALLS:  Has patient fallen in last 6 months? No   OCCUPATION: retired  PLOF:  typically pt is in bed all day long.   PATIENT GOALS: less pain   NEXT MD VISIT: unknown   OBJECTIVE:   DIAGNOSTIC FINDINGS:     IMPRESSION: This MRI of the cervical spine without contrast shows the following: The spinal cord appears normal. At C3-C4, there are degenerative changes causing moderately severe left foraminal narrowing but no spinal stenosis.  There is potential for left C4 nerve root compression. At C4-C5 and C5-C6 and C6-C7, degenerative changes cause mild spinal stenosis and moderate foraminal narrowing.  There does not appear to be nerve root compression.       INTERPRETING PHYSICIAN:  Richard A. Epimenio Foot, MD, PhD, FAAN Certified in  Neuroimaging by [employer]    PATIENT SURVEYS:  FOTO 46  COGNITION: Overall cognitive status: Within functional limits for tasks assessed  SENSATION: WFL  POSTURE: rounded shoulders, forward head, decreased lumbar lordosis, and increased thoracic kyphosis  PALPATION: Jt tightness more than mm   CERVICAL ROM:   Active ROM A/PROM (deg) eval  Flexion 25  Extension 30  Right lateral flexion 10  Left lateral flexion 10  Right rotation 35  Left rotation 38   (Blank rows = not tested) Isometric :  RT SB: 2 , LT SB 2 ; extension 2  UPPER EXTREMITY ROM: Lt is limited secondary to prior CVA     TODAY'S TREATMENT:                                                                                                                              DATE: 05/05/23: Reviewed goals Educated importance of HEP compliance Sitting   Tall sitting Cervical rotation x 10 Cervical side bend x 5 Scapular retraction x 5    Isometrics rotation and extension Cervical extension  05/02/23 evaluation Sitting  Tall sitting Cervical rotation x 10 Cervical side bend x 5 Scapular retraction x 5    PATIENT  EDUCATION:  Education details: HEP Person educated: Patient Education method: Explanation, Actor cues, Verbal cues, and Handouts Education comprehension: returned demonstration and needs further education  HOME EXERCISE PROGRAM: Access Code: [email or web address] URL: [email or web address] Date: 05/02/2023 Prepared by: Virgina Organ  Exercises - Seated Scapular Retraction  - 2 x daily - 7 x weekly - 1 sets - 10 reps - 3-5" hold - Supine Cervical Rotation AROM on Pillow  - 2 x daily - 7 x weekly - 1 sets - 10 reps - 3-5" hold - Supine Scapular Retraction  - 2 x daily - 7 x weekly - 1 sets - 10 reps - 3-5" hold - Beginner Head Nod  - 2 x daily - 7 x weekly - 1 sets - 10 reps - 3-5" hold  05/05/23: - Seated Isometric Cervical Rotation  - 2 x daily - 7 x weekly - 1 sets - 5 reps - 5" hold - Seated Isometric Cervical Extension  - 2 x daily - 7 x weekly - 1 sets - 5 reps - 5" hold  ASSESSMENT:  CLINICAL IMPRESSION: Pt arrived with spouse, discussed goals and importance of posture and compliance with HEP.  Pt unable to recall current exercise program, husband stated tech has assisted with exercises at home.  Reviewed current exercises with multimodal cueing required for correct form with minimal musculature activation palpated.  Added isometric for strengthening that was difficult to understand, review mechanics next session.  Husband works during day and is difficult to get to therapy, have requested to receive home health.  Patient is a 72 y.o. female  who was seen today for physical therapy evaluation and treatment for cervical pain.  The pt and husband states that since her stroke the pt spends most of her time in  the bed.  She is non ambulatory.    Evaluation demonstrates poor posture, decreased ROM, decreased strength and increased pain.  Ms. Husak will benefit from skilled PT to address these issues and maximize her functional level.   OBJECTIVE IMPAIRMENTS: decreased ROM, decreased strength, hypomobility, increased fascial restrictions, postural dysfunction, and pain.   ACTIVITY LIMITATIONS: sleeping and reach over head   PERSONAL FACTORS: Age, Fitness, Time since onset of injury/illness/exacerbation, and 1 comorbidity: CVA  are also affecting patient's functional outcome.   REHAB POTENTIAL: Good  CLINICAL DECISION MAKING: Stable/uncomplicated  EVALUATION COMPLEXITY: Low   GOALS: Goals reviewed with patient? No  SHORT TERM GOALS: Target date: 05/29/23-unable to get in right away  Pt to be I in HEP in order to decrease pain to no greater than a 4/10 Baseline:  Goal status: IN PROGRESS  2.  Pt to be able to rotate her head at least 45 degrees to allow scanning of her environment Baseline:  Goal status: IN PROGRESS    LONG TERM GOALS: Target date: 06/16/23  Pt to be I in advanced HEP in order to decrease pain to no greater than a 1/10 Baseline:  Goal status: IN PROGRESS  2.  Pt to be able to rotate her head at least 55 degrees to allow scanning of her environment Baseline:  Goal status: IN PROGRESS  3.  PT cervical mm to improve to at least 3/5 to allow pt to keep her head up to reduce strain on cervical area.  Baseline:  Goal status: IN PROGRESS   PLAN:  PT FREQUENCY: 2x/week  PT DURATION: 6 weeks  PLANNED INTERVENTIONS: Therapeutic exercises, Therapeutic activity, Balance training, Gait training, Patient/Family education, Self Care, Joint mobilization, and Manual  therapy  PLAN FOR NEXT SESSION: PT treatment will be more effective supine.  Pt begin gentle jt mob, isometric exercises, give isometric for HEP.  Secretary called MD 05/05/23 to inform MD of transportation and  request HHPT for pt.    Becky Sax, LPTA/CLT; [facility]IS [phone]  Juel Burrow, PTA 05/05/2023, 3:28 PM  05/05/2023, 3:28 PM

## 2023-05-10 ENCOUNTER — Ambulatory Visit (INDEPENDENT_AMBULATORY_CARE_PROVIDER_SITE_OTHER)
Admission: RE | Admit: 2023-05-10 | Discharge: 2023-05-10 | Disposition: A | Discharge status: Home / Self Care | Payer: Medicare HMO | Source: Ambulatory Visit | Attending: Internal Medicine | Admitting: Internal Medicine

## 2023-05-10 ENCOUNTER — Other Ambulatory Visit: Payer: Medicare HMO

## 2023-05-10 DIAGNOSIS — E2839 Other primary ovarian failure: Secondary | ICD-10-CM

## 2023-05-11 ENCOUNTER — Telehealth (HOSPITAL_COMMUNITY): Payer: Self-pay

## 2023-05-11 NOTE — Telephone Encounter (Signed)
Talked to [place] at Arnold Nundkumar's office concerning pt's difficulty to make it to outpatient setting and their wishes to transition to HHPT.   Becky Sax, LPTA/CLT; [place] [phone]'

## 2023-05-12 ENCOUNTER — Other Ambulatory Visit: Payer: Self-pay | Admitting: Internal Medicine

## 2023-05-12 ENCOUNTER — Encounter: Payer: Self-pay | Admitting: Radiology

## 2023-05-12 ENCOUNTER — Telehealth (HOSPITAL_COMMUNITY): Payer: Self-pay

## 2023-05-12 ENCOUNTER — Encounter (HOSPITAL_COMMUNITY): Payer: Medicare HMO

## 2023-05-12 MED ORDER — ALENDRONATE SODIUM 70 MG PO TABS: 70.0000 mg | ORAL_TABLET | ORAL | 3 refills | Status: DC

## 2023-05-12 NOTE — Telephone Encounter (Signed)
No show, called and left message concerning missed apt. today. Pt does not have any apts remaining here. Included contact number for husband to call back and let us know if he will return to OPPT or when plans to transition to HHPT so we can DC.   Becky Sax, LPTA/CLT; [place] [phone]

## 2023-05-16 DIAGNOSIS — Z1212 Encounter for screening for malignant neoplasm of rectum: Secondary | ICD-10-CM | POA: Diagnosis not present

## 2023-05-16 DIAGNOSIS — Z1211 Encounter for screening for malignant neoplasm of colon: Secondary | ICD-10-CM | POA: Diagnosis not present

## 2023-05-17 ENCOUNTER — Other Ambulatory Visit: Payer: Self-pay

## 2023-05-17 ENCOUNTER — Emergency Department (HOSPITAL_COMMUNITY): Payer: Medicare HMO

## 2023-05-17 ENCOUNTER — Encounter (HOSPITAL_COMMUNITY): Payer: Self-pay

## 2023-05-17 ENCOUNTER — Inpatient Hospital Stay (HOSPITAL_COMMUNITY)
Admission: EM | Admit: 2023-05-17 | Discharge: 2023-05-19 | DRG: 193 | Disposition: A | Discharge status: Home Health | Payer: Medicare HMO | Attending: Family Medicine | Admitting: Family Medicine

## 2023-05-17 DIAGNOSIS — I671 Cerebral aneurysm, nonruptured: Secondary | ICD-10-CM | POA: Diagnosis not present

## 2023-05-17 DIAGNOSIS — Z79890 Hormone replacement therapy (postmenopausal): Secondary | ICD-10-CM | POA: Diagnosis not present

## 2023-05-17 DIAGNOSIS — G9340 Encephalopathy, unspecified: Secondary | ICD-10-CM | POA: Diagnosis not present

## 2023-05-17 DIAGNOSIS — Z9109 Other allergy status, other than to drugs and biological substances: Secondary | ICD-10-CM | POA: Diagnosis not present

## 2023-05-17 DIAGNOSIS — Z7983 Long term (current) use of bisphosphonates: Secondary | ICD-10-CM | POA: Diagnosis not present

## 2023-05-17 DIAGNOSIS — I10 Essential (primary) hypertension: Secondary | ICD-10-CM | POA: Diagnosis not present

## 2023-05-17 DIAGNOSIS — J9601 Acute respiratory failure with hypoxia: Secondary | ICD-10-CM | POA: Diagnosis present

## 2023-05-17 DIAGNOSIS — S42002A Fracture of unspecified part of left clavicle, initial encounter for closed fracture: Secondary | ICD-10-CM | POA: Diagnosis not present

## 2023-05-17 DIAGNOSIS — R590 Localized enlarged lymph nodes: Secondary | ICD-10-CM | POA: Diagnosis not present

## 2023-05-17 DIAGNOSIS — Z7902 Long term (current) use of antithrombotics/antiplatelets: Secondary | ICD-10-CM

## 2023-05-17 DIAGNOSIS — E785 Hyperlipidemia, unspecified: Secondary | ICD-10-CM | POA: Diagnosis present

## 2023-05-17 DIAGNOSIS — Z886 Allergy status to analgesic agent status: Secondary | ICD-10-CM | POA: Diagnosis not present

## 2023-05-17 DIAGNOSIS — Z79899 Other long term (current) drug therapy: Secondary | ICD-10-CM

## 2023-05-17 DIAGNOSIS — Z887 Allergy status to serum and vaccine status: Secondary | ICD-10-CM

## 2023-05-17 DIAGNOSIS — R4182 Altered mental status, unspecified: Secondary | ICD-10-CM | POA: Diagnosis not present

## 2023-05-17 DIAGNOSIS — K828 Other specified diseases of gallbladder: Secondary | ICD-10-CM | POA: Diagnosis not present

## 2023-05-17 DIAGNOSIS — F0390 Unspecified dementia without behavioral disturbance: Secondary | ICD-10-CM | POA: Diagnosis present

## 2023-05-17 DIAGNOSIS — Z87892 Personal history of anaphylaxis: Secondary | ICD-10-CM | POA: Diagnosis not present

## 2023-05-17 DIAGNOSIS — Z8249 Family history of ischemic heart disease and other diseases of the circulatory system: Secondary | ICD-10-CM

## 2023-05-17 DIAGNOSIS — R7989 Other specified abnormal findings of blood chemistry: Secondary | ICD-10-CM | POA: Diagnosis not present

## 2023-05-17 DIAGNOSIS — M8580 Other specified disorders of bone density and structure, unspecified site: Secondary | ICD-10-CM | POA: Diagnosis present

## 2023-05-17 DIAGNOSIS — Z818 Family history of other mental and behavioral disorders: Secondary | ICD-10-CM

## 2023-05-17 DIAGNOSIS — Z515 Encounter for palliative care: Secondary | ICD-10-CM | POA: Diagnosis not present

## 2023-05-17 DIAGNOSIS — T424X5A Adverse effect of benzodiazepines, initial encounter: Secondary | ICD-10-CM | POA: Diagnosis present

## 2023-05-17 DIAGNOSIS — R0902 Hypoxemia: Secondary | ICD-10-CM | POA: Diagnosis not present

## 2023-05-17 DIAGNOSIS — R1310 Dysphagia, unspecified: Secondary | ICD-10-CM | POA: Diagnosis present

## 2023-05-17 DIAGNOSIS — I69354 Hemiplegia and hemiparesis following cerebral infarction affecting left non-dominant side: Secondary | ICD-10-CM

## 2023-05-17 DIAGNOSIS — Z888 Allergy status to other drugs, medicaments and biological substances status: Secondary | ICD-10-CM

## 2023-05-17 DIAGNOSIS — H9190 Unspecified hearing loss, unspecified ear: Secondary | ICD-10-CM | POA: Diagnosis present

## 2023-05-17 DIAGNOSIS — E039 Hypothyroidism, unspecified: Secondary | ICD-10-CM | POA: Diagnosis present

## 2023-05-17 DIAGNOSIS — Z1152 Encounter for screening for COVID-19: Secondary | ICD-10-CM | POA: Diagnosis not present

## 2023-05-17 DIAGNOSIS — R404 Transient alteration of awareness: Secondary | ICD-10-CM | POA: Diagnosis not present

## 2023-05-17 DIAGNOSIS — J189 Pneumonia, unspecified organism: Secondary | ICD-10-CM | POA: Diagnosis not present

## 2023-05-17 DIAGNOSIS — E119 Type 2 diabetes mellitus without complications: Secondary | ICD-10-CM | POA: Diagnosis present

## 2023-05-17 DIAGNOSIS — R29818 Other symptoms and signs involving the nervous system: Secondary | ICD-10-CM | POA: Diagnosis not present

## 2023-05-17 DIAGNOSIS — J9811 Atelectasis: Secondary | ICD-10-CM | POA: Diagnosis not present

## 2023-05-17 DIAGNOSIS — R531 Weakness: Secondary | ICD-10-CM | POA: Diagnosis not present

## 2023-05-17 DIAGNOSIS — R231 Pallor: Secondary | ICD-10-CM | POA: Diagnosis not present

## 2023-05-17 DIAGNOSIS — G928 Other toxic encephalopathy: Secondary | ICD-10-CM | POA: Diagnosis present

## 2023-05-17 DIAGNOSIS — R410 Disorientation, unspecified: Secondary | ICD-10-CM | POA: Diagnosis not present

## 2023-05-17 DIAGNOSIS — Z87891 Personal history of nicotine dependence: Secondary | ICD-10-CM

## 2023-05-17 DIAGNOSIS — Z7189 Other specified counseling: Secondary | ICD-10-CM | POA: Diagnosis not present

## 2023-05-17 LAB — RAPID URINE DRUG SCREEN, HOSP PERFORMED
Amphetamines: NOT DETECTED
Barbiturates: NOT DETECTED
Benzodiazepines: POSITIVE — AB
Cocaine: NOT DETECTED
Opiates: NOT DETECTED
Tetrahydrocannabinol: NOT DETECTED

## 2023-05-17 LAB — URINALYSIS, ROUTINE W REFLEX MICROSCOPIC
Bilirubin Urine: NEGATIVE
Glucose, UA: NEGATIVE mg/dL
Hgb urine dipstick: NEGATIVE
Ketones, ur: 20 mg/dL — AB
Leukocytes,Ua: NEGATIVE
Nitrite: NEGATIVE
Protein, ur: 30 mg/dL — AB
Specific Gravity, Urine: 1.026 (ref 1.005–1.030)
pH: 5 (ref 5.0–8.0)

## 2023-05-17 LAB — COMPREHENSIVE METABOLIC PANEL
ALT: 14 U/L (ref 0–44)
AST: 16 U/L (ref 15–41)
Albumin: 3.3 g/dL — ABNORMAL LOW (ref 3.5–5.0)
Alkaline Phosphatase: 59 U/L (ref 38–126)
Anion gap: 12 (ref 5–15)
BUN: 22 mg/dL (ref 8–23)
CO2: 23 mmol/L (ref 22–32)
Calcium: 8.6 mg/dL — ABNORMAL LOW (ref 8.9–10.3)
Chloride: 102 mmol/L (ref 98–111)
Creatinine, Ser: 1.1 mg/dL — ABNORMAL HIGH (ref 0.44–1.00)
GFR, Estimated: 53 mL/min — ABNORMAL LOW (ref >60)
Glucose, Bld: 117 mg/dL — ABNORMAL HIGH (ref 70–99)
Potassium: 3.9 mmol/L (ref 3.5–5.1)
Sodium: 137 mmol/L (ref 135–145)
Total Bilirubin: 0.7 mg/dL (ref 0.3–1.2)
Total Protein: 7.4 g/dL (ref 6.5–8.1)

## 2023-05-17 LAB — BLOOD GAS, VENOUS
Acid-Base Excess: 3.2 mmol/L — ABNORMAL HIGH (ref 0.0–2.0)
Bicarbonate: 30.6 mmol/L — ABNORMAL HIGH (ref 20.0–28.0)
Drawn by: 66297
O2 Saturation: 54.8 %
Patient temperature: 37.2
pCO2, Ven: 58 mmHg (ref 44–60)
pH, Ven: 7.33 (ref 7.25–7.43)
pO2, Ven: 34 mmHg (ref 32–45)

## 2023-05-17 LAB — CBC WITH DIFFERENTIAL/PLATELET
Abs Immature Granulocytes: 0.03 10*3/uL (ref 0.00–0.07)
Basophils Absolute: 0.1 10*3/uL (ref 0.0–0.1)
Basophils Relative: 1 %
Eosinophils Absolute: 0 10*3/uL (ref 0.0–0.5)
Eosinophils Relative: 0 %
HCT: 41.6 % (ref 36.0–46.0)
Hemoglobin: 13.6 g/dL (ref 12.0–15.0)
Immature Granulocytes: 0 %
Lymphocytes Relative: 16 %
Lymphs Abs: 1.5 10*3/uL (ref 0.7–4.0)
MCH: 33.7 pg (ref 26.0–34.0)
MCHC: 32.7 g/dL (ref 30.0–36.0)
MCV: 103 fL — ABNORMAL HIGH (ref 80.0–100.0)
Monocytes Absolute: 1 10*3/uL (ref 0.1–1.0)
Monocytes Relative: 11 %
Neutro Abs: 6.6 10*3/uL (ref 1.7–7.7)
Neutrophils Relative %: 72 %
Platelets: 235 10*3/uL (ref 150–400)
RBC: 4.04 MIL/uL (ref 3.87–5.11)
RDW: 16.1 % — ABNORMAL HIGH (ref 11.5–15.5)
WBC: 9.2 10*3/uL (ref 4.0–10.5)
nRBC: 0 % (ref 0.0–0.2)

## 2023-05-17 LAB — RESP PANEL BY RT-PCR (RSV, FLU A&B, COVID)  RVPGX2
Influenza A by PCR: NEGATIVE
Influenza B by PCR: NEGATIVE
Resp Syncytial Virus by PCR: NEGATIVE
SARS Coronavirus 2 by RT PCR: NEGATIVE

## 2023-05-17 LAB — TROPONIN I (HIGH SENSITIVITY)
Troponin I (High Sensitivity): 14 ng/L (ref <18)
Troponin I (High Sensitivity): 8 ng/L (ref <18)

## 2023-05-17 LAB — AMMONIA: Ammonia: 31 umol/L (ref 9–35)

## 2023-05-17 LAB — TSH: TSH: 0.839 u[IU]/mL (ref 0.350–4.500)

## 2023-05-17 LAB — CK: Total CK: 37 U/L — ABNORMAL LOW (ref 38–234)

## 2023-05-17 LAB — T4, FREE: Free T4: 1.95 ng/dL — ABNORMAL HIGH (ref 0.61–1.12)

## 2023-05-17 LAB — CBG MONITORING, ED: Glucose-Capillary: 96 mg/dL (ref 70–99)

## 2023-05-17 LAB — LIPASE, BLOOD: Lipase: 19 U/L (ref 11–51)

## 2023-05-17 MED ORDER — LACTATED RINGERS IV BOLUS
1000.0000 mL | Freq: Once | INTRAVENOUS | Status: AC
  Administered 2023-05-17: 1000 mL via INTRAVENOUS

## 2023-05-17 MED ORDER — SODIUM CHLORIDE 0.9 % IV SOLN
500.0000 mg | Freq: Once | INTRAVENOUS | Status: AC
  Administered 2023-05-17: 500 mg via INTRAVENOUS
  Filled 2023-05-17: qty 5

## 2023-05-17 MED ORDER — INSULIN ASPART 100 UNIT/ML IJ SOLN: 0.0000 [IU] | Freq: Every day | INTRAMUSCULAR | Status: AC

## 2023-05-17 MED ORDER — LEVOTHYROXINE SODIUM 88 MCG PO TABS: 88.0000 ug | ORAL_TABLET | Freq: Every day | ORAL | Status: DC

## 2023-05-17 MED ORDER — DIVALPROEX SODIUM 250 MG PO DR TAB
500.0000 mg | DELAYED_RELEASE_TABLET | Freq: Every day | ORAL | Status: DC
  Administered 2023-05-18: 500 mg via ORAL
  Filled 2023-05-17: qty 2

## 2023-05-17 MED ORDER — SODIUM CHLORIDE 0.9 % IV SOLN
2.0000 g | INTRAVENOUS | Status: DC
  Administered 2023-05-18 – 2023-05-19 (×2): 2 g via INTRAVENOUS
  Filled 2023-05-17 (×2): qty 20

## 2023-05-17 MED ORDER — VALPROATE SODIUM 100 MG/ML IV SOLN
500.0000 mg | Freq: Once | INTRAVENOUS | Status: AC
  Administered 2023-05-18: 500 mg via INTRAVENOUS
  Filled 2023-05-17: qty 5

## 2023-05-17 MED ORDER — ALPRAZOLAM 0.25 MG PO TABS: 0.2500 mg | ORAL_TABLET | Freq: Two times a day (BID) | ORAL | Status: DC | PRN

## 2023-05-17 MED ORDER — ACETAMINOPHEN 325 MG PO TABS
650.0000 mg | ORAL_TABLET | Freq: Four times a day (QID) | ORAL | Status: DC | PRN
  Administered 2023-05-18: 650 mg via ORAL
  Filled 2023-05-17: qty 2

## 2023-05-17 MED ORDER — CLOPIDOGREL BISULFATE 75 MG PO TABS
75.0000 mg | ORAL_TABLET | Freq: Every day | ORAL | Status: DC
  Administered 2023-05-19: 75 mg via ORAL
  Filled 2023-05-17: qty 1

## 2023-05-17 MED ORDER — ACETAMINOPHEN 650 MG RE SUPP: 650.0000 mg | Freq: Four times a day (QID) | RECTAL | Status: DC | PRN

## 2023-05-17 MED ORDER — ROSUVASTATIN CALCIUM 10 MG PO TABS
5.0000 mg | ORAL_TABLET | Freq: Every day | ORAL | Status: DC
  Administered 2023-05-19: 5 mg via ORAL
  Filled 2023-05-17: qty 1

## 2023-05-17 MED ORDER — INSULIN ASPART 100 UNIT/ML IJ SOLN: 0.0000 [IU] | Freq: Three times a day (TID) | INTRAMUSCULAR | Status: AC

## 2023-05-17 MED ORDER — SODIUM CHLORIDE 0.9 % IV SOLN
2.0000 g | Freq: Once | INTRAVENOUS | Status: AC
  Administered 2023-05-17: 2 g via INTRAVENOUS
  Filled 2023-05-17: qty 20

## 2023-05-17 MED ORDER — IOHEXOL 300 MG/ML  SOLN
100.0000 mL | Freq: Once | INTRAMUSCULAR | Status: AC | PRN
  Administered 2023-05-17: 100 mL via INTRAVENOUS

## 2023-05-17 MED ORDER — SODIUM CHLORIDE 0.9 % IV SOLN
500.0000 mg | INTRAVENOUS | Status: AC
  Administered 2023-05-18 – 2023-05-19 (×2): 500 mg via INTRAVENOUS
  Filled 2023-05-17 (×2): qty 5

## 2023-05-17 MED ORDER — POLYETHYLENE GLYCOL 3350 17 G PO PACK: 17.0000 g | PACK | Freq: Every day | ORAL | Status: DC | PRN

## 2023-05-17 MED ORDER — DIVALPROEX SODIUM 250 MG PO DR TAB
250.0000 mg | DELAYED_RELEASE_TABLET | Freq: Every day | ORAL | Status: DC
  Administered 2023-05-19: 250 mg via ORAL
  Filled 2023-05-17: qty 1

## 2023-05-17 MED ORDER — PANTOPRAZOLE SODIUM 40 MG PO TBEC
40.0000 mg | DELAYED_RELEASE_TABLET | Freq: Every day | ORAL | Status: DC
  Administered 2023-05-19: 40 mg via ORAL
  Filled 2023-05-17: qty 1

## 2023-05-17 MED ORDER — ENOXAPARIN SODIUM 40 MG/0.4ML IJ SOSY
40.0000 mg | PREFILLED_SYRINGE | INTRAMUSCULAR | Status: DC
  Administered 2023-05-18: 40 mg via SUBCUTANEOUS
  Filled 2023-05-17: qty 0.4

## 2023-05-17 MED ORDER — SODIUM CHLORIDE 0.9% FLUSH
3.0000 mL | Freq: Two times a day (BID) | INTRAVENOUS | Status: DC
  Administered 2023-05-18 – 2023-05-19 (×2): 3 mL via INTRAVENOUS

## 2023-05-17 NOTE — H&P (Addendum)
History and Physical    Patient: Susan Davidson NWG:[id number] DOB: Sep 14, 1951 DOA: 05/17/2023 DOS: the patient was seen and examined on 05/17/2023 PCP: Corwin Levins, MD  Patient coming from: Home  Chief Complaint:  Chief Complaint  Patient presents with   Fatigue   HPI: Susan Davidson is a 72 y.o. female with medical history significant of old stroke, hypertension and known chronic cognitive disorder.  Patient is typically cared for by husband.  History is obtained from secondary sources due to patient's current presentation.  Approximately for 2 days patient has been noted to be not eating or drinking much, much more lethargic at home.  This prompted the patient to be brought to the ER by family  Patient is noted to be hypoxic on initial evaluation in the ER.  Further workup as noted below.  Medical evaluation is sought.  There is no report of patient having nausea vomiting or diarrhea having reported any pain or any rash or skin boils. Review of Systems: unable to review all systems due to the inability of the patient to answer questions. Past Medical History:  Diagnosis Date   ANXIETY 02/12/2008   Qualifier: Diagnosis of  By: Jonny Ruiz MD, Len Blalock    CVA (cerebral vascular accident) [facility])    DEPRESSION 02/12/2008   Qualifier: Diagnosis of  By: Maris Berger    HYPERLIPIDEMIA 02/12/2008   Qualifier: Diagnosis of  By: Maris Berger    HYPERTENSION 02/12/2008   Qualifier: Diagnosis of  By: Maris Berger    HYPOTHYROIDISM 02/12/2008   Qualifier: Diagnosis of  By: Maris Berger    Impaired glucose tolerance 08/27/2011   Left hemiparesis (HCC)    OSTEOPENIA 02/12/2008   Qualifier: Diagnosis of  By: Jonny Ruiz MD, Len Blalock    VITAMIN D DEFICIENCY 04/14/2010   Qualifier: Diagnosis of  By: Jonny Ruiz MD, Len Blalock    Past Surgical History:  Procedure Laterality Date   IR RADIOLOGIST EVAL & MGMT  08/18/2021   Social History:  reports that she quit smoking  about 2 years ago. Her smoking use included cigarettes. She has never used smokeless tobacco. She reports that she does not currently use alcohol. She reports that she does not use drugs.  Allergies  Allergen Reactions   Aleve [Naproxen] Nausea Only   Other Anaphylaxis    Fire ant venom   Covid-19 Mrna Vaccine [employer]) [phone] Mrna Vacc (Moderna)]     Just the first round of moderna Covid vaccine, sluggish, uncoordinated, and weak, similar to flu.   Epinephrine    Fire [employer] (Solenopsis [place])    Influenza Vac Split Quad     Fatigue, mimics a "super bad flu"    Family History  Problem Relation Age of Onset   Heart disease Father    Bipolar disorder Sister    Diabetes Neg Hx     Prior to Admission medications   Medication Sig Start Date End Date Taking? Authorizing Provider  acetaminophen (TYLENOL) 325 MG tablet Take 2 tablets (650 mg total) by mouth every 6 (six) hours as needed for mild pain or headache (fever >/= 101). 08/04/21  Yes Shon Hale, MD  alendronate (FOSAMAX) 70 MG tablet Take 1 tablet (70 mg total) by mouth every 7 (seven) days. Take with a full glass of water on an empty stomach. 05/12/23  Yes Corwin Levins, MD  ALPRAZolam Prudy Feeler) 0.5 MG tablet Take 1 tablet (0.5 mg total) by mouth 2 (two) times daily as needed  for anxiety. 03/02/23  Yes Corwin Levins, MD  ascorbic acid (VITAMIN C) 500 MG tablet Take 1 tablet (500 mg total) by mouth daily. 08/05/21  Yes Emokpae, Courage, MD  clopidogrel (PLAVIX) 75 MG tablet Take 1 tablet (75 mg total) by mouth daily. 06/13/22  Yes Corwin Levins, MD  divalproex (DEPAKOTE) 250 MG DR tablet Take 1 tablet by mouth See admin instructions. 1 tablet in the morning and 2 tablets every evening 07/28/21  Yes [provider]  levothyroxine (SYNTHROID) 88 MCG tablet Take 1 tablet (88 mcg total) by mouth daily. 12/14/22  Yes Reardon, Alphonzo Lemmings J, NP  melatonin 5 MG TABS Take 5 mg by mouth.   Yes [provider]  memantine (NAMENDA)  5 MG tablet Take 1 tablet (5 mg total) by mouth 2 (two) times daily. 06/13/22  Yes Corwin Levins, MD  Multiple Vitamin (MULTIVITAMIN) tablet Take 1 tablet by mouth daily.   Yes [provider]  OVER THE COUNTER MEDICATION Take 1 tablet by mouth daily. magnesium   Yes [provider]  pantoprazole (PROTONIX) 40 MG tablet Take 1 tablet (40 mg total) by mouth daily. 04/10/23  Yes Corwin Levins, MD  PARoxetine (PAXIL) 40 MG tablet Take 40 mg by mouth every morning. 01/01/22  Yes [provider]  polyethylene glycol-electrolytes (NULYTELY) 420 g solution Take 4,000 mLs by mouth once. 04/25/23  Yes [provider]  rosuvastatin (CRESTOR) 5 MG tablet TAKE 1 TABLET BY MOUTH DAILY. HOLD CRESTOR WHILE TAKING PAXLOVID 12/26/22  Yes Corwin Levins, MD  polyethylene glycol (MIRALAX / GLYCOLAX) 17 g packet Take 17 g by mouth daily as needed for mild constipation. Patient not taking: Reported on 05/17/2023 08/04/21   Shon Hale, MD    Physical Exam: Vitals:   05/17/23 1430 05/17/23 1606 05/17/23 1606 05/17/23 1900  BP: 120/73  109/65 130/67  Pulse: 75  73   Resp: 15  18   Temp:  97.6 F (36.4 C)    TempSrc:  Axillary    SpO2: 97%  97%   Weight:      Height:       Neural: Patient wakes up to verbal stimulation, however is not interacting, makes only transient eye contact and does not follow directions. Respiratory exam: Bilateral intravesicular Cardiovascular exam S1-S2 normal Abdomen all quadrants are soft nontender Extremities are warm without edema: Patient withdraws slightly to noxious stimulation of both upper and lower extremities.  Less so lower extremities.  However no focal finding is noted. Data Reviewed:  Labs on Admission:  Results for orders placed or performed during the hospital encounter of 05/17/23 (from the past 24 hour(s))  Urinalysis, Routine w reflex microscopic -Urine, Clean Catch     Status: Abnormal   Collection Time: 05/17/23 11:38 AM   Result Value Ref Range   Color, Urine AMBER (A) YELLOW   APPearance HAZY (A) CLEAR   Specific Gravity, Urine 1.026 1.005 - 1.030   pH 5.0 5.0 - 8.0   Glucose, UA NEGATIVE NEGATIVE mg/dL   Hgb urine dipstick NEGATIVE NEGATIVE   Bilirubin Urine NEGATIVE NEGATIVE   Ketones, ur 20 (A) NEGATIVE mg/dL   Protein, ur 30 (A) NEGATIVE mg/dL   Nitrite NEGATIVE NEGATIVE   Leukocytes,Ua NEGATIVE NEGATIVE   RBC / HPF 6-10 0 - 5 RBC/hpf   WBC, UA 0-5 0 - 5 WBC/hpf   Bacteria, UA RARE (A) NONE SEEN   Squamous Epithelial / HPF 0-5 0 - 5 /HPF  Mucus PRESENT   Rapid urine drug screen (hospital performed)     Status: Abnormal   Collection Time: 05/17/23 11:38 AM  Result Value Ref Range   Opiates NONE DETECTED NONE DETECTED   Cocaine NONE DETECTED NONE DETECTED   Benzodiazepines POSITIVE (A) NONE DETECTED   Amphetamines NONE DETECTED NONE DETECTED   Tetrahydrocannabinol NONE DETECTED NONE DETECTED   Barbiturates NONE DETECTED NONE DETECTED  Resp panel by RT-PCR (RSV, Flu A&B, Covid) Urine, Clean Catch     Status: None   Collection Time: 05/17/23 11:39 AM   Specimen: Urine, Clean Catch; Nasal Swab  Result Value Ref Range   SARS Coronavirus 2 by RT PCR NEGATIVE NEGATIVE   Influenza A by PCR NEGATIVE NEGATIVE   Influenza B by PCR NEGATIVE NEGATIVE   Resp Syncytial Virus by PCR NEGATIVE NEGATIVE  CBC with Differential     Status: Abnormal   Collection Time: 05/17/23 11:46 AM  Result Value Ref Range   WBC 9.2 4.0 - 10.5 K/uL   RBC 4.04 3.87 - 5.11 MIL/uL   Hemoglobin 13.6 12.0 - 15.0 g/dL   HCT 16.1 09.6 - 04.5 %   MCV 103.0 (H) 80.0 - 100.0 fL   MCH 33.7 26.0 - 34.0 pg   MCHC 32.7 30.0 - 36.0 g/dL   RDW 40.9 (H) 81.1 - 91.4 %   Platelets 235 150 - 400 K/uL   nRBC 0.0 0.0 - 0.2 %   Neutrophils Relative % 72 %   Neutro Abs 6.6 1.7 - 7.7 K/uL   Lymphocytes Relative 16 %   Lymphs Abs 1.5 0.7 - 4.0 K/uL   Monocytes Relative 11 %   Monocytes Absolute 1.0 0.1 - 1.0 K/uL   Eosinophils Relative  0 %   Eosinophils Absolute 0.0 0.0 - 0.5 K/uL   Basophils Relative 1 %   Basophils Absolute 0.1 0.0 - 0.1 K/uL   Immature Granulocytes 0 %   Abs Immature Granulocytes 0.03 0.00 - 0.07 K/uL  Comprehensive metabolic panel     Status: Abnormal   Collection Time: 05/17/23 11:46 AM  Result Value Ref Range   Sodium 137 135 - 145 mmol/L   Potassium 3.9 3.5 - 5.1 mmol/L   Chloride 102 98 - 111 mmol/L   CO2 23 22 - 32 mmol/L   Glucose, Bld 117 (H) 70 - 99 mg/dL   BUN 22 8 - 23 mg/dL   Creatinine, Ser 7.82 (H) 0.44 - 1.00 mg/dL   Calcium 8.6 (L) 8.9 - 10.3 mg/dL   Total Protein 7.4 6.5 - 8.1 g/dL   Albumin 3.3 (L) 3.5 - 5.0 g/dL   AST 16 15 - 41 U/L   ALT 14 0 - 44 U/L   Alkaline Phosphatase 59 38 - 126 U/L   Total Bilirubin 0.7 0.3 - 1.2 mg/dL   GFR, Estimated 53 (L) >60 mL/min   Anion gap 12 5 - 15  Lipase, blood     Status: None   Collection Time: 05/17/23 11:46 AM  Result Value Ref Range   Lipase 19 11 - 51 U/L  Ammonia     Status: None   Collection Time: 05/17/23 11:46 AM  Result Value Ref Range   Ammonia 31 9 - 35 umol/L  CK     Status: Abnormal   Collection Time: 05/17/23 11:46 AM  Result Value Ref Range   Total CK 37 (L) 38 - 234 U/L  TSH     Status: None   Collection Time: 05/17/23 11:46  AM  Result Value Ref Range   TSH 0.839 0.350 - 4.500 uIU/mL  T4, free     Status: Abnormal   Collection Time: 05/17/23 11:46 AM  Result Value Ref Range   Free T4 1.95 (H) 0.61 - 1.12 ng/dL  Troponin I (High Sensitivity)     Status: None   Collection Time: 05/17/23 11:48 AM  Result Value Ref Range   Troponin I (High Sensitivity) 14 <18 ng/L  Blood gas, venous (at [facility] and [facility])     Status: Abnormal   Collection Time: 05/17/23 12:58 PM  Result Value Ref Range   pH, Ven 7.33 7.25 - 7.43   pCO2, Ven 58 44 - 60 mmHg   pO2, Ven 34 32 - 45 mmHg   Bicarbonate 30.6 (H) 20.0 - 28.0 mmol/L   Acid-Base Excess 3.2 (H) 0.0 - 2.0 mmol/L   O2 Saturation 54.8 %   Patient temperature 37.2     Collection site LEFT ANTECUBITAL    Drawn by [id number]   Troponin I (High Sensitivity)     Status: None   Collection Time: 05/17/23  1:58 PM  Result Value Ref Range   Troponin I (High Sensitivity) 8 <18 ng/L   Basic Metabolic Panel: Recent Labs  Lab 05/17/23 1146  NA 137  K 3.9  CL 102  CO2 23  GLUCOSE 117*  BUN 22  CREATININE 1.10*  CALCIUM 8.6*   Liver Function Tests: Recent Labs  Lab 05/17/23 1146  AST 16  ALT 14  ALKPHOS 59  BILITOT 0.7  PROT 7.4  ALBUMIN 3.3*   Recent Labs  Lab 05/17/23 1146  LIPASE 19   Recent Labs  Lab 05/17/23 1146  AMMONIA 31   CBC: Recent Labs  Lab 05/17/23 1146  WBC 9.2  NEUTROABS 6.6  HGB 13.6  HCT 41.6  MCV 103.0*  PLT 235   Cardiac Enzymes: Recent Labs  Lab 05/17/23 1146 05/17/23 1148 05/17/23 1358  CKTOTAL 37*  --   --   TROPONINIHS  --  14 8    BNP (last 3 results) No results for input(s): "PROBNP" in the last 8760 hours. CBG: No results for input(s): "GLUCAP" in the last 168 hours.  Radiological Exams on Admission:  MR BRAIN WO CONTRAST  Result Date: 05/17/2023 CLINICAL DATA:  Neuro deficit, acute, stroke suspected. EXAM: MRI HEAD WITHOUT CONTRAST TECHNIQUE: Multiplanar, multiecho pulse sequences of the brain and surrounding structures were obtained without intravenous contrast. COMPARISON:  CT head without contrast 05/16/2013. MR head without contrast 01/18/2022. MR angiogram 03/06/2023. FINDINGS: Brain: No acute infarct, hemorrhage, or mass lesion is present. A remote right ACA territory infarct is noted. Periventricular and scattered subcortical T2 hyperintensities are otherwise mildly advanced for age. Remote lacunar infarcts are present within the basal ganglia bilaterally. Remote lacunar infarcts are present in the thalami, left greater than right. Remote lacunar infarcts are present in the cerebellum bilaterally. The brainstem is within normal limits. The internal auditory canals are within normal limits.  Midline structures are within normal limits. Vascular: Flow is present in the major intracranial arteries. 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 Skull and upper cervical spine: The craniocervical junction is normal. Upper cervical spine is within normal limits. Marrow signal is unremarkable. Sinuses/Orbits: The left sphenoid sinus is opacified. The paranasal sinuses and mastoid air cells are otherwise clear. The globes and orbits are within normal limits. Other: IMPRESSION: 1. No acute intracranial abnormality. 2. Remote right ACA territory infarct. 3. Remote lacunar  infarcts of the basal ganglia, thalami, and cerebellum bilaterally. 4. Periventricular and scattered subcortical T2 hyperintensities are otherwise mildly advanced for age. This likely reflects the sequela of chronic microvascular ischemia. 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 Electronically Signed   By: Marin Roberts M.D.   On: 05/17/2023 17:16   CT CHEST ABDOMEN PELVIS W CONTRAST  Result Date: 05/17/2023 CLINICAL DATA:  Altered mental status. Failure to thrive. Lethargy. Decreased oral intake at home. EXAM: CT CHEST, ABDOMEN, AND PELVIS WITH CONTRAST TECHNIQUE: Multidetector CT imaging of the chest, abdomen and pelvis was performed following the standard protocol during bolus administration of intravenous contrast. RADIATION DOSE REDUCTION: This exam was performed according to the departmental dose-optimization program which includes automated exposure control, adjustment of the mA and/or kV according to patient size and/or use of iterative reconstruction technique. CONTRAST:  OMNIPAQUE IOHEXOL 300 MG/ML  SOLN COMPARISON:  Chest CT angiogram November 2022. Chest x-ray 05/17/2023. FINDINGS: CT CHEST FINDINGS Cardiovascular: Heart is nonenlarged. No pericardial effusion. The thoracic aorta has a normal course and caliber with vascular calcifications. Coronary artery calcifications are seen.  Mediastinum/Nodes: Slightly patulous thoracic esophagus with some luminal air and fluid. Small thyroid gland. No specific abnormal lymph node enlargement identified in the axillary regions, hilum or mediastinum. Only exception is a subcarinal node on the right side measuring on series 3, image 24 at 2.2 by 1.0 cm. This has not seen on the prior examination Lungs/Pleura: Breathing motion identified. Consolidative opacity seen along the right lower lobe with some volume loss. There is some patchy opacity in the left lower lobe as well. Acute infiltrate is possible. There are some areas of bronchial wall thickening. There are some dependent ground-glass in the upper lobes. No pneumothorax or effusion. Musculoskeletal: Curvature of the spine. Scattered degenerative changes. Osteopenia. Elevation of the right hemidiaphragm. CT ABDOMEN PELVIS FINDINGS Hepatobiliary: Patent portal vein. No space-occupying enhancing liver lesion. Gallbladder is mildly distended Pancreas: Moderate global atrophy of the pancreas. There is a small cystic lesion identified along the uncinate process on series 3, image 66 measuring 9 mm in maximal dimension. Spleen: Splenic punctate calcified granuloma. The spleen is nonenlarged Adrenals/Urinary Tract: Adrenal glands are preserved. No enhancing renal mass. No collecting system dilatation. Parapelvic renal cysts are identified on the left. The ureters have normal course and caliber extending down to the bladder. Contracted urinary bladder. Stomach/Bowel: The large bowel has a normal course and caliber with scattered stool. Contracted stomach. Small bowel is nondilated. Normal caliber appendix extends medial to the cecum in the right lower quadrant. Vascular/Lymphatic: Diffuse vascular calcifications. This includes the branch vessels. There are areas of potential high-grade stenosis along the renal arteries. Please correlate for any symptoms in the level of hypertension normal caliber IVC. No  specific abnormal lymph node enlargement identified in the abdomen and pelvis. Reproductive: Uterus and bilateral adnexa are unremarkable. Other: No abdominal wall hernia or abnormality. No abdominopelvic ascites. Musculoskeletal: Curvature of the spine with degenerative changes. Degenerative changes as well along the pelvis. Osteopenia. There is also streak artifact as the patient's left arm was scanned while at the patient's side. IMPRESSION: Bilateral lower lobe consolidative lung opacities, right-greater-than-left. Acute infiltrate. Recommend follow-up. There is also an enlarged subcarinal lymph node which could be reactive and attention on follow up as well. Motion artifact. No bowel obstruction, free air or free fluid. Scattered stool. Normal appendix. Diffuse atherosclerotic changes. 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 Please correlate with any particular symptoms. Elevated right  hemidiaphragm. Electronically Signed   By: Karen Kays M.D.   On: 05/17/2023 15:57   CT Head Wo Contrast  Result Date: 05/17/2023 CLINICAL DATA:  Delirium EXAM: CT HEAD WITHOUT CONTRAST TECHNIQUE: Contiguous axial images were obtained from the base of the skull through the vertex without intravenous contrast. RADIATION DOSE REDUCTION: This exam was performed according to the departmental dose-optimization program which includes automated exposure control, adjustment of the mA and/or kV according to patient size and/or use of iterative reconstruction technique. COMPARISON:  CT Head 08/02/21 FINDINGS: Brain: No evidence of acute infarction, hemorrhage, hydrocephalus, extra-axial collection or mass lesion/mass effect. Chronic infarcts in the right frontoparietal region and right cerebellum. Vascular: No hyperdense vessel or unexpected calcification. Skull: Normal. Negative for fracture or focal lesion. Severe degenerative changes of the left TMJ. Sinuses/Orbits: No middle ear or mastoid effusion.  Paranasal sinuses are clear. Orbits are unremarkable. Other: None. IMPRESSION: 1. No acute intracranial abnormality. 2. Chronic infarcts in the right frontoparietal region and right cerebellum. Electronically Signed   By: Lorenza Cambridge M.D.   On: 05/17/2023 13:41   DG Chest Portable 1 View  Result Date: 05/17/2023 CLINICAL DATA:  Altered mental status EXAM: PORTABLE CHEST 1 VIEW COMPARISON:  01/18/2022 FINDINGS: Underinflation. There is some linear opacity left lung base. Atelectasis is favored. No pneumothorax or effusion. Normal cardiopericardial silhouette. No edema. Osteopenia. Comminuted left clavicle fracture. Overlapping cardiac leads. IMPRESSION: Poor inflation.  Increasing left basilar atelectasis. Electronically Signed   By: Karen Kays M.D.   On: 05/17/2023 13:36    EKG: Independently reviewed. NSR   Assessment and Plan: * Community acquired pneumonia This is confirmed on CAT scan.  However patient's atypical presentation is noted there is no fever no white count.  However patient was hypoxic on presentation.  We will treat with ceftriaxone and azithromycin.  I will also get a swallow evaluation done.  Diet can be started in the morning once the swallow evaluation is completed.  Elevated serum free T4 level Normal TSH, this is felt to be nonspecific and would not adequately explain current presentation.  I think this testing should be repeated in the future and proceed from there. C.with. home dose of synthoid.  Acute encephalopathy Has been going on for 2 days manifesting as lethargy, not eating drinking.  I believe this is likely due to hypoxia that was initially noted on presentation of the patient.  We will continue with oxygen as needed.  Unfortunately in spite of this patient's mentation is still to improve fully.  It is noted that the patient's urine tox occasion panel is showing benzodiazepines, this can slowly be cut down.   Restart diet in AM once patnet is more awake,  swallow eval pending.blood glucose monitoirng ordered till then.  I reduce Xanax to 0.25 mg twice daily as needed  Resume paroxetine and memandine once patinet mentation improves.   Advance Care Planning:   Code Status: Full Code based on priro documentation   I discussed with husband - patient is full code.  Consults: none at this time.  Family Communication: son brought patient to hospital.  Severity of Illness: The appropriate patient status for this patient is INPATIENT. Inpatient status is judged to be reasonable and necessary in order to provide the required intensity of service to ensure the patient's safety. The patient's presenting symptoms, physical exam findings, and initial radiographic and laboratory data in the context of their chronic comorbidities is felt to place them at high risk for further clinical  deterioration. Furthermore, it is not anticipated that the patient will be medically stable for discharge from the hospital within 2 midnights of admission.   * I certify that at the point of admission it is my clinical judgment that the patient will require inpatient hospital care spanning beyond 2 midnights from the point of admission due to high intensity of service, high risk for further deterioration and high frequency of surveillance required.*  Author: Nolberto Hanlon, MD 05/17/2023 8:17 PM  For on call review [email or web address].

## 2023-05-17 NOTE — Assessment & Plan Note (Signed)
This is confirmed on CAT scan.  However patient's atypical presentation is noted there is no fever no white count.  However patient was hypoxic on presentation.  We will treat with ceftriaxone and azithromycin.  I will also get a swallow evaluation done.  Diet can be started in the morning once the swallow evaluation is completed.

## 2023-05-17 NOTE — ED Notes (Signed)
Pts mentation continues to improve. Pt is now alert to voice and intermittently follows commands.

## 2023-05-17 NOTE — Assessment & Plan Note (Addendum)
Normal TSH, this is felt to be nonspecific and would not adequately explain current presentation.  I think this testing should be repeated in the future and proceed from there. C.with. home dose of synthoid.

## 2023-05-17 NOTE — ED Notes (Signed)
Pt is difficult to arouse. Is verbal but quickly falls back to sleep. Concerns for aspiration and pts ability to swallow po meds discussed with admitting provider Rosalin Hawking who will place alternative medication and keep pt NPO at this time

## 2023-05-17 NOTE — ED Triage Notes (Signed)
Pt brought in from home by [facility] for lethargy and failure to thrive. Pt has been decreasing oral intake at home per EMS from spouse and becoming more lethargic. Pt has hx of CVAs.

## 2023-05-17 NOTE — ED Provider Notes (Signed)
[place] EMERGENCY DEPARTMENT AT [facility] Provider Note  CSN: [id number] Arrival date & time: 05/17/23 1024  Chief Complaint(s) Fatigue  HPI Susan Davidson is a 72 y.o. female with past medical history as below, significant for CVA, depression, anxiety, HLD, HTN, confusion, hearing loss, daytime somnolence who presents to the ED with complaint of fatigue  Patient is nonverbal today, per spouse and EMS she has been having reduced oral intake over the past week, reduced interactiveness, more somnolent than her typical baseline.  She was seen 8/2 by physical therapy and tolerated well.  She is unable to contribute history today.  She will open her eyes and transiently follow commands but then falls asleep.  Past Medical History Past Medical History:  Diagnosis Date   ANXIETY 02/12/2008   Qualifier: Diagnosis of  By: Jonny Ruiz MD, Len Blalock    CVA (cerebral vascular accident) [facility])    DEPRESSION 02/12/2008   Qualifier: Diagnosis of  By: Maris Berger    HYPERLIPIDEMIA 02/12/2008   Qualifier: Diagnosis of  By: Maris Berger    HYPERTENSION 02/12/2008   Qualifier: Diagnosis of  By: Maris Berger    HYPOTHYROIDISM 02/12/2008   Qualifier: Diagnosis of  By: Maris Berger    Impaired glucose tolerance 08/27/2011   Left hemiparesis (HCC)    OSTEOPENIA 02/12/2008   Qualifier: Diagnosis of  By: Jonny Ruiz MD, Len Blalock    VITAMIN D DEFICIENCY 04/14/2010   Qualifier: Diagnosis of  By: Jonny Ruiz MD, Len Blalock    Patient Active Problem List   Diagnosis Date Noted   Epigastric pain 04/11/2023   Aneurysm (HCC) 12/12/2022   Left arm weakness 12/12/2022   Confusion 01/08/2022   Bipolar illness (HCC) 01/06/2022   Gross hematuria 11/29/2021   Bilateral hearing loss 10/29/2021   History of stroke 10/29/2021   Nail disorder 10/29/2021   Unresponsiveness 08/02/2021   Acute metabolic encephalopathy 05/04/2021   CKD (chronic kidney disease) stage 3, GFR 30-59  ml/min (HCC) 05/04/2021   COVID-19 virus infection 05/04/2021   Lower urinary tract infectious disease    Generalized weakness 01/27/2021   Spinal stenosis at L4-L5 level 01/27/2021   CVA (cerebral vascular accident) (HCC) 01/27/2021   Ambulatory dysfunction 01/26/2021   Tobacco use disorder 01/26/2021   CKD (chronic kidney disease) 11/15/2020   B12 deficiency 11/09/2020   Urinary frequency 11/02/2018   Daytime somnolence 10/19/2012   Impaired glucose tolerance 08/27/2011   Encounter for well adult exam with abnormal findings 08/27/2011   Vitamin D deficiency 04/14/2010   HYPERSOMNIA 02/19/2009   Hypothyroidism 02/12/2008   HLD (hyperlipidemia) 02/12/2008   Anxiety state 02/12/2008   Depression 02/12/2008   Essential hypertension 02/12/2008   Osteopenia 02/12/2008   Home Medication(s) Prior to Admission medications   Medication Sig Start Date End Date Taking? Authorizing Provider  acetaminophen (TYLENOL) 325 MG tablet Take 2 tablets (650 mg total) by mouth every 6 (six) hours as needed for mild pain or headache (fever >/= 101). 08/04/21   Shon Hale, MD  alendronate (FOSAMAX) 70 MG tablet Take 1 tablet (70 mg total) by mouth every 7 (seven) days. Take with a full glass of water on an empty stomach. 05/12/23   Corwin Levins, MD  ALPRAZolam Prudy Feeler) 0.5 MG tablet Take 1 tablet (0.5 mg total) by mouth 2 (two) times daily as needed for anxiety. 03/02/23   Corwin Levins, MD  ascorbic acid (VITAMIN C) 500 MG tablet Take 1 tablet (500 mg total) by mouth  daily. 08/05/21   Shon Hale, MD  clopidogrel (PLAVIX) 75 MG tablet Take 1 tablet (75 mg total) by mouth daily. 06/13/22   Corwin Levins, MD  divalproex (DEPAKOTE) 250 MG DR tablet Take 1 tablet by mouth See admin instructions. 1 tablet in the morning and 2 tablets every evening 07/28/21   [provider]  EPINEPHrine 0.3 mg/0.3 mL IJ SOAJ injection Inject 0.3 mg into the muscle as needed for anaphylaxis. 04/25/22   Corwin Levins, MD  levothyroxine (SYNTHROID) 88 MCG tablet Take 1 tablet (88 mcg total) by mouth daily. 12/14/22   Dani Gobble, NP  melatonin 5 MG TABS Take 5 mg by mouth.    [provider]  memantine (NAMENDA) 5 MG tablet Take 1 tablet (5 mg total) by mouth 2 (two) times daily. 06/13/22   Corwin Levins, MD  Multiple Vitamin (MULTIVITAMIN) tablet Take 1 tablet by mouth daily.    [provider]  OVER THE COUNTER MEDICATION Take 1 tablet by mouth daily. magnesium    [provider]  pantoprazole (PROTONIX) 40 MG tablet Take 1 tablet (40 mg total) by mouth daily. 04/10/23   Corwin Levins, MD  PARoxetine (PAXIL) 40 MG tablet Take 40 mg by mouth every morning. 01/01/22   [provider]  polyethylene glycol (MIRALAX / GLYCOLAX) 17 g packet Take 17 g by mouth daily as needed for mild constipation. 08/04/21   Shon Hale, MD  polyethylene glycol (MIRALAX / GLYCOLAX) 17 g packet Take 17 g by mouth 2 (two) times daily. 04/20/23 07/19/23  Franky Macho, MD  rosuvastatin (CRESTOR) 5 MG tablet TAKE 1 TABLET BY MOUTH DAILY. HOLD CRESTOR WHILE TAKING PAXLOVID 12/26/22   Corwin Levins, MD                                                                                                                                    Past Surgical History Past Surgical History:  Procedure Laterality Date   IR RADIOLOGIST EVAL & MGMT  08/18/2021   Family History Family History  Problem Relation Age of Onset   Heart disease Father    Bipolar disorder Sister    Diabetes Neg Hx     Social History Social History   Tobacco Use   Smoking status: Former    Current packs/day: 0.00    Types: Cigarettes    Quit date: 01/26/2021    Years since quitting: 2.3   Smokeless tobacco: Never  Vaping Use   Vaping status: Never Used  Substance Use Topics   Alcohol use: Not Currently   Drug use: No   Allergies Aleve [naproxen], Other, Epinephrine, Fire ant (solenopsis [place]), and Influenza vac  split quad  Review of Systems Review of Systems  Unable to perform ROS: Patient unresponsive    Physical Exam Vital Signs  I have reviewed the triage vital signs BP (!) 107/53  Pulse 75   Temp 98.5 F (36.9 C) (Axillary)   Resp 17   Ht 5\' 2"  (1.575 m)   Wt 63.5 kg   SpO2 98%   BMI 25.60 kg/m  Physical Exam Constitutional:      Comments: frail  HENT:     Head: Normocephalic and atraumatic.     Right Ear: External ear normal.     Left Ear: External ear normal.     Mouth/Throat:     Mouth: Mucous membranes are dry.  Eyes:     General: No scleral icterus.       Right eye: No discharge.        Left eye: No discharge.     Pupils: Pupils are equal, round, and reactive to light.  Cardiovascular:     Rate and Rhythm: Normal rate and regular rhythm.     Pulses: Normal pulses.     Heart sounds: Normal heart sounds. No murmur heard.    No gallop.  Pulmonary:     Effort: Pulmonary effort is normal. No respiratory distress.     Breath sounds: No wheezing.     Comments: Breath sounds diminished b/l Abdominal:     General: Abdomen is flat.     Palpations: Abdomen is soft.     Tenderness: There is no abdominal tenderness. There is no guarding.  Musculoskeletal:     Cervical back: No rigidity.     Right lower leg: No edema.     Left lower leg: No edema.  Skin:    General: Skin is dry.     Capillary Refill: Capillary refill takes 2 to 3 seconds.     Coloration: Skin is not jaundiced.  Neurological:     Mental Status: She is lethargic and disoriented.     GCS: GCS eye subscore is 2. GCS verbal subscore is 2. GCS motor subscore is 6.     Comments: Non compliance with neuro testing Does withdraw from noxious stimulus in all 4 extremities Intermittently follow commands      ED Results and Treatments Labs (all labs ordered are listed, but only abnormal results are displayed) Labs Reviewed  CBC WITH DIFFERENTIAL/PLATELET - Abnormal; Notable for the following components:       Result Value   MCV 103.0 (*)    RDW 16.1 (*)    All other components within normal limits  COMPREHENSIVE METABOLIC PANEL - Abnormal; Notable for the following components:   Glucose, Bld 117 (*)    Creatinine, Ser 1.10 (*)    Calcium 8.6 (*)    Albumin 3.3 (*)    GFR, Estimated 53 (*)    All other components within normal limits  URINALYSIS, ROUTINE W REFLEX MICROSCOPIC - Abnormal; Notable for the following components:   Color, Urine AMBER (*)    APPearance HAZY (*)    Ketones, ur 20 (*)    Protein, ur 30 (*)    Bacteria, UA RARE (*)    All other components within normal limits  BLOOD GAS, VENOUS - Abnormal; Notable for the following components:   Bicarbonate 30.6 (*)    Acid-Base Excess 3.2 (*)    All other components within normal limits  CK - Abnormal; Notable for the following components:   Total CK 37 (*)    All other components within normal limits  RAPID URINE DRUG SCREEN, HOSP PERFORMED - Abnormal; Notable for the following components:   Benzodiazepines POSITIVE (*)    All other components within normal limits  RESP PANEL BY RT-PCR (RSV, FLU A&B, COVID)  RVPGX2  LIPASE, BLOOD  AMMONIA  TSH  T4, FREE  TROPONIN I (HIGH SENSITIVITY)  TROPONIN I (HIGH SENSITIVITY)                                                                                                                          Radiology CT Head Wo Contrast  Result Date: 05/17/2023 CLINICAL DATA:  Delirium EXAM: CT HEAD WITHOUT CONTRAST TECHNIQUE: Contiguous axial images were obtained from the base of the skull through the vertex without intravenous contrast. RADIATION DOSE REDUCTION: This exam was performed according to the departmental dose-optimization program which includes automated exposure control, adjustment of the mA and/or kV according to patient size and/or use of iterative reconstruction technique. COMPARISON:  CT Head 08/02/21 FINDINGS: Brain: No evidence of acute infarction, hemorrhage, hydrocephalus,  extra-axial collection or mass lesion/mass effect. Chronic infarcts in the right frontoparietal region and right cerebellum. Vascular: No hyperdense vessel or unexpected calcification. Skull: Normal. Negative for fracture or focal lesion. Severe degenerative changes of the left TMJ. Sinuses/Orbits: No middle ear or mastoid effusion. Paranasal sinuses are clear. Orbits are unremarkable. Other: None. IMPRESSION: 1. No acute intracranial abnormality. 2. Chronic infarcts in the right frontoparietal region and right cerebellum. Electronically Signed   By: Lorenza Cambridge M.D.   On: 05/17/2023 13:41   DG Chest Portable 1 View  Result Date: 05/17/2023 CLINICAL DATA:  Altered mental status EXAM: PORTABLE CHEST 1 VIEW COMPARISON:  01/18/2022 FINDINGS: Underinflation. There is some linear opacity left lung base. Atelectasis is favored. No pneumothorax or effusion. Normal cardiopericardial silhouette. No edema. Osteopenia. Comminuted left clavicle fracture. Overlapping cardiac leads. IMPRESSION: Poor inflation.  Increasing left basilar atelectasis. Electronically Signed   By: Karen Kays M.D.   On: 05/17/2023 13:36    Pertinent labs & imaging results that were available during my care of the patient were reviewed by me and considered in my medical decision making (see MDM for details).  Medications Ordered in ED Medications  lactated ringers bolus 1,000 mL (1,000 mLs Intravenous Bolus 05/17/23 1139)  iohexol (OMNIPAQUE) 300 MG/ML solution 100 mL (100 mLs Intravenous Contrast Given 05/17/23 1448)                                                                                                                                     Procedures .Critical Care  Performed  by: Sloan Leiter, DO Authorized by: Sloan Leiter, DO   Critical care provider statement:    Critical care time (minutes):  32   Critical care time was exclusive of:  Separately billable procedures and treating other patients   Critical care was  necessary to treat or prevent imminent or life-threatening deterioration of the following conditions:  Respiratory failure   Critical care was time spent personally by me on the following activities:  Development of treatment plan with patient or surrogate, discussions with consultants, evaluation of patient's response to treatment, examination of patient, ordering and review of laboratory studies, ordering and review of radiographic studies, ordering and performing treatments and interventions, pulse oximetry, re-evaluation of patient's condition, review of old charts and obtaining history from patient or surrogate   (including critical care time)  Medical Decision Making / ED Course    Medical Decision Making:    SHEELA MALL is a 72 y.o. female with past medical history as below, significant for CVA, depression, anxiety, HLD, HTN, confusion, hearing loss, daytime somnolence who presents to the ED with complaint of fatigue. The complaint involves an extensive differential diagnosis and also carries with it a high risk of complications and morbidity.  Serious etiology was considered. Ddx includes but is not limited to: Differential diagnoses for altered mental status includes but is not exclusive to alcohol, illicit or prescription medications, intracranial pathology such as stroke, intracerebral hemorrhage, fever or infectious causes including sepsis, hypoxemia, uremia, trauma, endocrine related disorders such as diabetes, hypoglycemia, thyroid-related diseases, etc.   Complete initial physical exam performed, notably the patient  was intermittently responsive, somnolent, pulse ox 90% on RA, placed on 2LNC with improvement to 98%.    Reviewed and confirmed nursing documentation for past medical history, family history, social history.  Vital signs reviewed.       Narrative: 72 yo female Poor historian  Concern by family for refusing eating, not ambulating, sig less interactive than her  baseline Hx prior CVA Decrease in functional status over last few days UDS w/ opiates (xanax at home) UA w/o overt UTI but ketones noted, give IVF CTH and CXR were stable CT Chest/abd/pelvis pending She has new hypoxia requiring 2L [place] Pt will require admission for encephalopathy LKN > 24 hours ago Signed out to incoming EDP pending remainder of workup and dispo                   Additional history obtained: -Additional history obtained from EMS -External records from outside source obtained and reviewed including: Chart review including previous notes, labs, imaging, consultation notes including  Prior outpatient documentation Home meds Prior labs   Lab Tests: -I ordered, reviewed, and interpreted labs.   The pertinent results include:   Labs Reviewed  CBC WITH DIFFERENTIAL/PLATELET - Abnormal; Notable for the following components:      Result Value   MCV 103.0 (*)    RDW 16.1 (*)    All other components within normal limits  COMPREHENSIVE METABOLIC PANEL - Abnormal; Notable for the following components:   Glucose, Bld 117 (*)    Creatinine, Ser 1.10 (*)    Calcium 8.6 (*)    Albumin 3.3 (*)    GFR, Estimated 53 (*)    All other components within normal limits  URINALYSIS, ROUTINE W REFLEX MICROSCOPIC - Abnormal; Notable for the following components:   Color, Urine AMBER (*)    APPearance HAZY (*)    Ketones, ur 20 (*)  Protein, ur 30 (*)    Bacteria, UA RARE (*)    All other components within normal limits  BLOOD GAS, VENOUS - Abnormal; Notable for the following components:   Bicarbonate 30.6 (*)    Acid-Base Excess 3.2 (*)    All other components within normal limits  CK - Abnormal; Notable for the following components:   Total CK 37 (*)    All other components within normal limits  RAPID URINE DRUG SCREEN, HOSP PERFORMED - Abnormal; Notable for the following components:   Benzodiazepines POSITIVE (*)    All other components within normal  limits  RESP PANEL BY RT-PCR (RSV, FLU A&B, COVID)  RVPGX2  LIPASE, BLOOD  AMMONIA  TSH  T4, FREE  TROPONIN I (HIGH SENSITIVITY)  TROPONIN I (HIGH SENSITIVITY)    Notable for as above  EKG   EKG Interpretation Date/Time:  Wednesday May 17 2023 11:11:31 EDT Ventricular Rate:  83 PR Interval:  195 QRS Duration:  85 QT Interval:  402 QTC Calculation: 473 R Axis:   52  Text Interpretation: Sinus rhythm Low voltage, extremity leads Minimal ST depression, anterior leads no stemi Confirmed by Tanda Rockers ([id number]) on 05/17/2023 3:50:02 PM         Imaging Studies ordered: I ordered imaging studies including CTH CXR CT chest/abd/pelvis I independently visualized the following imaging with scope of interpretation limited to determining acute life threatening conditions related to emergency care; findings noted above, significant for stable imaging, CT CAP pending I independently visualized and interpreted imaging. I agree with the radiologist interpretation   Medicines ordered and prescription drug management: Meds ordered this encounter  Medications   lactated ringers bolus 1,000 mL   iohexol (OMNIPAQUE) 300 MG/ML solution 100 mL    -I have reviewed the patients home medicines and have made adjustments as needed   Consultations Obtained: na   Cardiac Monitoring: The patient was maintained on a cardiac monitor.  I personally viewed and interpreted the cardiac monitored which showed an underlying rhythm of: NSR  Social Determinants of Health:  Diagnosis or treatment significantly limited by social determinants of health: former smoker   Reevaluation: After the interventions noted above, I reevaluated the patient and found that they have stayed the same  Co morbidities that complicate the patient evaluation  Past Medical History:  Diagnosis Date   ANXIETY 02/12/2008   Qualifier: Diagnosis of  By: Jonny Ruiz MD, Len Blalock    CVA (cerebral vascular accident) [facility])     DEPRESSION 02/12/2008   Qualifier: Diagnosis of  By: Maris Berger    HYPERLIPIDEMIA 02/12/2008   Qualifier: Diagnosis of  By: Maris Berger    HYPERTENSION 02/12/2008   Qualifier: Diagnosis of  By: Maris Berger    HYPOTHYROIDISM 02/12/2008   Qualifier: Diagnosis of  By: Maris Berger    Impaired glucose tolerance 08/27/2011   Left hemiparesis (HCC)    OSTEOPENIA 02/12/2008   Qualifier: Diagnosis of  By: Jonny Ruiz MD, Len Blalock    VITAMIN D DEFICIENCY 04/14/2010   Qualifier: Diagnosis of  By: Jonny Ruiz MD, Len Blalock       Dispostion: Disposition decision including need for hospitalization was considered, and patient disposition pending at time of sign out.    Final Clinical Impression(s) / ED Diagnoses Final diagnoses:  Encephalopathy  Hypoxia        Sloan Leiter, DO 05/17/23 1550

## 2023-05-17 NOTE — ED Notes (Signed)
ED TO INPATIENT HANDOFF REPORT  ED Nurse Name and Phone #: Fredric Mare RN  S Name/Age/Gender Susan Davidson 72 y.o. female Room/Bed: APAH1/APAH1  Code Status   Code Status: Full Code  Home/SNF/Other Home Patient oriented to: self and place Is this baseline? . Pt is confused at baseline. Family was concerned d/t pt being difficult to arouse and more fatigued than usual.   Triage Complete: Triage complete  Chief Complaint Community acquired pneumonia [J18.9]  Triage Note Pt brought in from home by [facility] for lethargy and failure to thrive. Pt has been decreasing oral intake at home per EMS from spouse and becoming more lethargic. Pt has hx of CVAs.    Allergies Allergies  Allergen Reactions   Aleve [Naproxen] Nausea Only   Other Anaphylaxis    Fire ant venom   Covid-19 Mrna Vaccine [employer]) [Covid-19 Mrna Vacc (Moderna)]     Just the first round of moderna Covid vaccine, sluggish, uncoordinated, and weak, similar to flu.   Epinephrine    Fire [employer] (Solenopsis [place])    Influenza Vac Split Quad     Fatigue, mimics a "super bad flu"    Level of Care/Admitting Diagnosis ED Disposition     ED Disposition  Admit   Condition  --   Comment  Hospital Area: [facility] [[id number]]  Level of Care: Med-Surg [16]  Covid Evaluation: Symptomatic Person Under Investigation (PUI) or recent exposure (last 10 days) *Testing Required*  Diagnosis: Community acquired pneumonia [[id number]]  Admitting Physician: Nolberto Hanlon [[id number]]  Attending Physician: Nolberto Hanlon [[id number]]  Certification:: I certify this patient will need inpatient services for at least 2 midnights  Expected Medical Readiness: 05/20/2023          B Medical/Surgery History Past Medical History:  Diagnosis Date   ANXIETY 02/12/2008   Qualifier: Diagnosis of  By: Jonny Ruiz MD, Len Blalock    CVA (cerebral vascular accident) [facility])    DEPRESSION 02/12/2008   Qualifier: Diagnosis of  By: Maris Berger     HYPERLIPIDEMIA 02/12/2008   Qualifier: Diagnosis of  By: Maris Berger    HYPERTENSION 02/12/2008   Qualifier: Diagnosis of  By: Maris Berger    HYPOTHYROIDISM 02/12/2008   Qualifier: Diagnosis of  By: Maris Berger    Impaired glucose tolerance 08/27/2011   Left hemiparesis (HCC)    OSTEOPENIA 02/12/2008   Qualifier: Diagnosis of  By: Jonny Ruiz MD, Len Blalock    VITAMIN D DEFICIENCY 04/14/2010   Qualifier: Diagnosis of  By: Jonny Ruiz MD, Len Blalock    Past Surgical History:  Procedure Laterality Date   IR RADIOLOGIST EVAL & MGMT  08/18/2021     A IV Location/Drains/Wounds Patient Lines/Drains/Airways Status     Active Line/Drains/Airways     Name Placement date Placement time Site Days   Peripheral IV 06/07/22 20 G Right Antecubital 06/07/22  1515  Antecubital  344   Peripheral IV 05/17/23 20 G Posterior;Right Hand 05/17/23  1116  Hand  less than 1   Pressure Injury 01/25/22 Heel Right Stage 1 -  Intact skin with non-blanchable redness of a localized area usually over a bony prominence. 3cm x 4cm - area boggy to touch - foam dressing in place - heels floated to relieve pressure 01/25/22  0800  -- 477            Intake/Output Last 24 hours No intake or output data in the 24 hours ending 05/17/23 2206  Labs/Imaging Results for orders placed or  performed during the hospital encounter of 05/17/23 (from the past 48 hour(s))  Urinalysis, Routine w reflex microscopic -Urine, Clean Catch     Status: Abnormal   Collection Time: 05/17/23 11:38 AM  Result Value Ref Range   Color, Urine AMBER (A) YELLOW    Comment: BIOCHEMICALS MAY BE AFFECTED BY COLOR   APPearance HAZY (A) CLEAR   Specific Gravity, Urine 1.026 1.005 - 1.030   pH 5.0 5.0 - 8.0   Glucose, UA NEGATIVE NEGATIVE mg/dL   Hgb urine dipstick NEGATIVE NEGATIVE   Bilirubin Urine NEGATIVE NEGATIVE   Ketones, ur 20 (A) NEGATIVE mg/dL   Protein, ur 30 (A) NEGATIVE mg/dL   Nitrite NEGATIVE NEGATIVE    Leukocytes,Ua NEGATIVE NEGATIVE   RBC / HPF 6-10 0 - 5 RBC/hpf   WBC, UA 0-5 0 - 5 WBC/hpf   Bacteria, UA RARE (A) NONE SEEN   Squamous Epithelial / HPF 0-5 0 - 5 /HPF   Mucus PRESENT     Comment: Performed at [facility], [address] [place], [place] [address]  Rapid urine drug screen (hospital performed)     Status: Abnormal   Collection Time: 05/17/23 11:38 AM  Result Value Ref Range   Opiates NONE DETECTED NONE DETECTED   Cocaine NONE DETECTED NONE DETECTED   Benzodiazepines POSITIVE (A) NONE DETECTED   Amphetamines NONE DETECTED NONE DETECTED   Tetrahydrocannabinol NONE DETECTED NONE DETECTED   Barbiturates NONE DETECTED NONE DETECTED    Comment: (NOTE) DRUG SCREEN FOR MEDICAL PURPOSES ONLY.  IF CONFIRMATION IS NEEDED FOR ANY PURPOSE, NOTIFY LAB WITHIN 5 DAYS.  LOWEST DETECTABLE LIMITS FOR URINE DRUG SCREEN Drug Class                     Cutoff (ng/mL) Amphetamine and metabolites    1000 Barbiturate and metabolites    200 Benzodiazepine                 200 Opiates and metabolites        300 Cocaine and metabolites        300 THC                            50 Performed at [facility], [address] [place] [address], [place], [place] [address]   Resp panel by RT-PCR (RSV, Flu A&B, Covid) Urine, Clean Catch     Status: None   Collection Time: 05/17/23 11:39 AM   Specimen: Urine, Clean Catch; Nasal Swab  Result Value Ref Range   SARS Coronavirus 2 by RT PCR NEGATIVE NEGATIVE    Comment: (NOTE) SARS-CoV-2 target nucleic acids are NOT DETECTED.  The SARS-CoV-2 RNA is generally detectable in upper respiratory specimens during the acute phase of infection. The lowest concentration of SARS-CoV-2 viral copies this assay can detect is 138 copies/mL. A negative result does not preclude SARS-Cov-2 infection and should not be used as the sole basis for treatment or other patient management decisions. A negative result may occur with  improper specimen collection/handling, submission of  specimen other than nasopharyngeal swab, presence of viral mutation(s) within the areas targeted by this assay, and inadequate number of viral copies(<138 copies/mL). A negative result must be combined with clinical observations, patient history, and epidemiological information. The expected result is Negative.  Fact Sheet for Patients:  BloggerCourse.com  Fact Sheet for Healthcare Providers:  SeriousBroker.it  This test is no t yet approved or cleared by the [place]  FDA and  has been authorized for detection and/or diagnosis of SARS-CoV-2 by FDA under an Emergency Use Authorization (EUA). This EUA will remain  in effect (meaning this test can be used) for the duration of the COVID-19 declaration under Section 564(b)(1) of the Act, 21 U.S.C.section 360bbb-3(b)(1), unless the authorization is terminated  or revoked sooner.       Influenza A by PCR NEGATIVE NEGATIVE   Influenza B by PCR NEGATIVE NEGATIVE    Comment: (NOTE) The Xpert Xpress SARS-CoV-2/FLU/RSV plus assay is intended as an aid in the diagnosis of influenza from Nasopharyngeal swab specimens and should not be used as a sole basis for treatment. Nasal washings and aspirates are unacceptable for Xpert Xpress SARS-CoV-2/FLU/RSV testing.  Fact Sheet for Patients: BloggerCourse.com  Fact Sheet for Healthcare Providers: SeriousBroker.it  This test is not yet approved or cleared by the [place] FDA and has been authorized for detection and/or diagnosis of SARS-CoV-2 by FDA under an Emergency Use Authorization (EUA). This EUA will remain in effect (meaning this test can be used) for the duration of the COVID-19 declaration under Section 564(b)(1) of the Act, 21 U.S.C. section 360bbb-3(b)(1), unless the authorization is terminated or revoked.     Resp Syncytial Virus by PCR NEGATIVE NEGATIVE    Comment:  (NOTE) Fact Sheet for Patients: BloggerCourse.com  Fact Sheet for Healthcare Providers: SeriousBroker.it  This test is not yet approved or cleared by the [place] FDA and has been authorized for detection and/or diagnosis of SARS-CoV-2 by FDA under an Emergency Use Authorization (EUA). This EUA will remain in effect (meaning this test can be used) for the duration of the COVID-19 declaration under Section 564(b)(1) of the Act, 21 U.S.C. section 360bbb-3(b)(1), unless the authorization is terminated or revoked.  Performed at [facility], [address], [place], [place] [address]   CBC with Differential     Status: Abnormal   Collection Time: 05/17/23 11:46 AM  Result Value Ref Range   WBC 9.2 4.0 - 10.5 K/uL   RBC 4.04 3.87 - 5.11 MIL/uL   Hemoglobin 13.6 12.0 - 15.0 g/dL   HCT 19.1 47.8 - 29.5 %   MCV 103.0 (H) 80.0 - 100.0 fL   MCH 33.7 26.0 - 34.0 pg   MCHC 32.7 30.0 - 36.0 g/dL   RDW 62.1 (H) 30.8 - 65.7 %   Platelets 235 150 - 400 K/uL   nRBC 0.0 0.0 - 0.2 %   Neutrophils Relative % 72 %   Neutro Abs 6.6 1.7 - 7.7 K/uL   Lymphocytes Relative 16 %   Lymphs Abs 1.5 0.7 - 4.0 K/uL   Monocytes Relative 11 %   Monocytes Absolute 1.0 0.1 - 1.0 K/uL   Eosinophils Relative 0 %   Eosinophils Absolute 0.0 0.0 - 0.5 K/uL   Basophils Relative 1 %   Basophils Absolute 0.1 0.0 - 0.1 K/uL   Immature Granulocytes 0 %   Abs Immature Granulocytes 0.03 0.00 - 0.07 K/uL    Comment: Performed at [facility], [address] [place], [place] [address]  Comprehensive metabolic panel     Status: Abnormal   Collection Time: 05/17/23 11:46 AM  Result Value Ref Range   Sodium 137 135 - 145 mmol/L   Potassium 3.9 3.5 - 5.1 mmol/L   Chloride 102 98 - 111 mmol/L   CO2 23 22 - 32 mmol/L   Glucose, Bld 117 (H) 70 - 99 mg/dL    Comment: Glucose reference range applies only  to samples taken after fasting for at least 8 hours.   BUN 22 8 -  23 mg/dL   Creatinine, Ser 1.61 (H) 0.44 - 1.00 mg/dL   Calcium 8.6 (L) 8.9 - 10.3 mg/dL   Total Protein 7.4 6.5 - 8.1 g/dL   Albumin 3.3 (L) 3.5 - 5.0 g/dL   AST 16 15 - 41 U/L   ALT 14 0 - 44 U/L   Alkaline Phosphatase 59 38 - 126 U/L   Total Bilirubin 0.7 0.3 - 1.2 mg/dL   GFR, Estimated 53 (L) >60 mL/min    Comment: (NOTE) Calculated using the CKD-EPI Creatinine Equation (2021)    Anion gap 12 5 - 15    Comment: Performed at [facility], [address], [place], [place] [address]  Lipase, blood     Status: None   Collection Time: 05/17/23 11:46 AM  Result Value Ref Range   Lipase 19 11 - 51 U/L    Comment: Performed at [facility], [address], [place], [place] [address]  Ammonia     Status: None   Collection Time: 05/17/23 11:46 AM  Result Value Ref Range   Ammonia 31 9 - 35 umol/L    Comment: Performed at [facility], [address], [place], [place] [address]  CK     Status: Abnormal   Collection Time: 05/17/23 11:46 AM  Result Value Ref Range   Total CK 37 (L) 38 - 234 U/L    Comment: Performed at [facility], [address], [place], [place] [address]  TSH     Status: None   Collection Time: 05/17/23 11:46 AM  Result Value Ref Range   TSH 0.839 0.350 - 4.500 uIU/mL    Comment: Performed by a 3rd Generation assay with a functional sensitivity of <=0.01 uIU/mL. Performed at [facility], [address] [place], [place] [address]   T4, free     Status: Abnormal   Collection Time: 05/17/23 11:46 AM  Result Value Ref Range   Free T4 1.95 (H) 0.61 - 1.12 ng/dL    Comment: (NOTE) Biotin ingestion may interfere with free T4 tests. If the results are inconsistent with the TSH level, previous test results, or the clinical presentation, then consider biotin interference. If needed, order repeat testing after stopping biotin. Performed at [facility], [address], [place], [place] [address]   Troponin I (High Sensitivity)     Status: None   Collection Time:  05/17/23 11:48 AM  Result Value Ref Range   Troponin I (High Sensitivity) 14 <18 ng/L    Comment: (NOTE) Elevated high sensitivity troponin I (hsTnI) values and significant  changes across serial measurements may suggest ACS but many other  chronic and acute conditions are known to elevate hsTnI results.  Refer to the "Links" section for chest pain algorithms and additional  guidance. Performed at [facility], [address], [place], [place] [address]   Blood gas, venous (at [facility] and [facility])     Status: Abnormal   Collection Time: 05/17/23 12:58 PM  Result Value Ref Range   pH, Ven 7.33 7.25 - 7.43   pCO2, Ven 58 44 - 60 mmHg   pO2, Ven 34 32 - 45 mmHg   Bicarbonate 30.6 (H) 20.0 - 28.0 mmol/L   Acid-Base Excess 3.2 (H) 0.0 - 2.0 mmol/L   O2 Saturation 54.8 %   Patient temperature 37.2    Collection site LEFT ANTECUBITAL    Drawn by [phone]  Comment: Performed at [facility], [address] [place], [place] [address]  Troponin I (High Sensitivity)     Status: None   Collection Time: 05/17/23  1:58 PM  Result Value Ref Range   Troponin I (High Sensitivity) 8 <18 ng/L    Comment: (NOTE) Elevated high sensitivity troponin I (hsTnI) values and significant  changes across serial measurements may suggest ACS but many other  chronic and acute conditions are known to elevate hsTnI results.  Refer to the "Links" section for chest pain algorithms and additional  guidance. Performed at [facility], [address] [place], [place] [address]   CBG monitoring, ED     Status: None   Collection Time: 05/17/23  9:29 PM  Result Value Ref Range   Glucose-Capillary 96 70 - 99 mg/dL    Comment: Glucose reference range applies only to samples taken after fasting for at least 8 hours.   MR BRAIN WO CONTRAST  Result Date: 05/17/2023 CLINICAL DATA:  Neuro deficit, acute, stroke suspected. EXAM: MRI HEAD WITHOUT CONTRAST TECHNIQUE: Multiplanar, multiecho pulse sequences of the brain and surrounding  structures were obtained without intravenous contrast. COMPARISON:  CT head without contrast 05/16/2013. MR head without contrast 01/18/2022. MR angiogram 03/06/2023. FINDINGS: Brain: No acute infarct, hemorrhage, or mass lesion is present. A remote right ACA territory infarct is noted. Periventricular and scattered subcortical T2 hyperintensities are otherwise mildly advanced for age. Remote lacunar infarcts are present within the basal ganglia bilaterally. Remote lacunar infarcts are present in the thalami, left greater than right. Remote lacunar infarcts are present in the cerebellum bilaterally. The brainstem is within normal limits. The internal auditory canals are within normal limits. Midline structures are within normal limits. Vascular: Flow is present in the major intracranial arteries. 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 Skull and upper cervical spine: The craniocervical junction is normal. Upper cervical spine is within normal limits. Marrow signal is unremarkable. Sinuses/Orbits: The left sphenoid sinus is opacified. The paranasal sinuses and mastoid air cells are otherwise clear. The globes and orbits are within normal limits. Other: IMPRESSION: 1. No acute intracranial abnormality. 2. Remote right ACA territory infarct. 3. Remote lacunar infarcts of the basal ganglia, thalami, and cerebellum bilaterally. 4. Periventricular and scattered subcortical T2 hyperintensities are otherwise mildly advanced for age. This likely reflects the sequela of chronic microvascular ischemia. 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 Electronically Signed   By: Marin Roberts M.D.   On: 05/17/2023 17:16   CT CHEST ABDOMEN PELVIS W CONTRAST  Result Date: 05/17/2023 CLINICAL DATA:  Altered mental status. Failure to thrive. Lethargy. Decreased oral intake at home. EXAM: CT CHEST, ABDOMEN, AND PELVIS WITH CONTRAST TECHNIQUE: Multidetector CT imaging of the chest, abdomen and pelvis  was performed following the standard protocol during bolus administration of intravenous contrast. RADIATION DOSE REDUCTION: This exam was performed according to the departmental dose-optimization program which includes automated exposure control, adjustment of the mA and/or kV according to patient size and/or use of iterative reconstruction technique. CONTRAST:  OMNIPAQUE IOHEXOL 300 MG/ML  SOLN COMPARISON:  Chest CT angiogram November 2022. Chest x-ray 05/17/2023. FINDINGS: CT CHEST FINDINGS Cardiovascular: Heart is nonenlarged. No pericardial effusion. The thoracic aorta has a normal course and caliber with vascular calcifications. Coronary artery calcifications are seen. Mediastinum/Nodes: Slightly patulous thoracic esophagus with some luminal air and fluid. Small thyroid gland. No specific abnormal lymph node enlargement identified in the axillary regions, hilum or mediastinum. Only exception is a subcarinal node  on the right side measuring on series 3, image 24 at 2.2 by 1.0 cm. This has not seen on the prior examination Lungs/Pleura: Breathing motion identified. Consolidative opacity seen along the right lower lobe with some volume loss. There is some patchy opacity in the left lower lobe as well. Acute infiltrate is possible. There are some areas of bronchial wall thickening. There are some dependent ground-glass in the upper lobes. No pneumothorax or effusion. Musculoskeletal: Curvature of the spine. Scattered degenerative changes. Osteopenia. Elevation of the right hemidiaphragm. CT ABDOMEN PELVIS FINDINGS Hepatobiliary: Patent portal vein. No space-occupying enhancing liver lesion. Gallbladder is mildly distended Pancreas: Moderate global atrophy of the pancreas. There is a small cystic lesion identified along the uncinate process on series 3, image 66 measuring 9 mm in maximal dimension. Spleen: Splenic punctate calcified granuloma. The spleen is nonenlarged Adrenals/Urinary Tract: Adrenal glands  are preserved. No enhancing renal mass. No collecting system dilatation. Parapelvic renal cysts are identified on the left. The ureters have normal course and caliber extending down to the bladder. Contracted urinary bladder. Stomach/Bowel: The large bowel has a normal course and caliber with scattered stool. Contracted stomach. Small bowel is nondilated. Normal caliber appendix extends medial to the cecum in the right lower quadrant. Vascular/Lymphatic: Diffuse vascular calcifications. This includes the branch vessels. There are areas of potential high-grade stenosis along the renal arteries. Please correlate for any symptoms in the level of hypertension normal caliber IVC. No specific abnormal lymph node enlargement identified in the abdomen and pelvis. Reproductive: Uterus and bilateral adnexa are unremarkable. Other: No abdominal wall hernia or abnormality. No abdominopelvic ascites. Musculoskeletal: Curvature of the spine with degenerative changes. Degenerative changes as well along the pelvis. Osteopenia. There is also streak artifact as the patient's left arm was scanned while at the patient's side. IMPRESSION: Bilateral lower lobe consolidative lung opacities, right-greater-than-left. Acute infiltrate. Recommend follow-up. There is also an enlarged subcarinal lymph node which could be reactive and attention on follow up as well. Motion artifact. No bowel obstruction, free air or free fluid. Scattered stool. Normal appendix. Diffuse atherosclerotic changes. 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 Please correlate with any particular symptoms. Elevated right hemidiaphragm. Electronically Signed   By: Karen Kays M.D.   On: 05/17/2023 15:57   CT Head Wo Contrast  Result Date: 05/17/2023 CLINICAL DATA:  Delirium EXAM: CT HEAD WITHOUT CONTRAST TECHNIQUE: Contiguous axial images were obtained from the base of the skull through the vertex without intravenous contrast. RADIATION DOSE  REDUCTION: This exam was performed according to the departmental dose-optimization program which includes automated exposure control, adjustment of the mA and/or kV according to patient size and/or use of iterative reconstruction technique. COMPARISON:  CT Head 08/02/21 FINDINGS: Brain: No evidence of acute infarction, hemorrhage, hydrocephalus, extra-axial collection or mass lesion/mass effect. Chronic infarcts in the right frontoparietal region and right cerebellum. Vascular: No hyperdense vessel or unexpected calcification. Skull: Normal. Negative for fracture or focal lesion. Severe degenerative changes of the left TMJ. Sinuses/Orbits: No middle ear or mastoid effusion. Paranasal sinuses are clear. Orbits are unremarkable. Other: None. IMPRESSION: 1. No acute intracranial abnormality. 2. Chronic infarcts in the right frontoparietal region and right cerebellum. Electronically Signed   By: Lorenza Cambridge M.D.   On: 05/17/2023 13:41   DG Chest Portable 1 View  Result Date: 05/17/2023 CLINICAL DATA:  Altered mental status EXAM: PORTABLE CHEST 1 VIEW COMPARISON:  01/18/2022 FINDINGS: Underinflation. There is some linear opacity left lung base. Atelectasis is favored. No  pneumothorax or effusion. Normal cardiopericardial silhouette. No edema. Osteopenia. Comminuted left clavicle fracture. Overlapping cardiac leads. IMPRESSION: Poor inflation.  Increasing left basilar atelectasis. Electronically Signed   By: Karen Kays M.D.   On: 05/17/2023 13:36    Pending Labs Unresulted Labs (From admission, onward)     Start     Ordered   05/24/23 0500  Creatinine, serum  (enoxaparin (LOVENOX)    CrCl >/= 30 ml/min)  Weekly,   R     Comments: while on enoxaparin therapy    05/17/23 1759   05/18/23 0500  APTT  Tomorrow morning,   R        05/17/23 1759   05/18/23 0500  Protime-INR  Tomorrow morning,   R        05/17/23 1759   05/18/23 0500  Basic metabolic panel  Tomorrow morning,   R        05/17/23 1759    05/18/23 0500  CBC  Tomorrow morning,   R        05/17/23 1759   05/18/23 0500  Vitamin B12  Tomorrow morning,   R        05/17/23 2021            Vitals/Pain Today's Vitals   05/17/23 2125 05/17/23 2130 05/17/23 2145 05/17/23 2200  BP:      Pulse:  81 81   Resp:  18 17   Temp:    98.3 F (36.8 C)  TempSrc:    Oral  SpO2:  95% 96%   Weight:      Height:      PainSc: 0-No pain       Isolation Precautions Airborne and Contact precautions  Medications Medications  enoxaparin (LOVENOX) injection 40 mg (has no administration in time range)  acetaminophen (TYLENOL) tablet 650 mg (has no administration in time range)    Or  acetaminophen (TYLENOL) suppository 650 mg (has no administration in time range)  polyethylene glycol (MIRALAX / GLYCOLAX) packet 17 g (has no administration in time range)  sodium chloride flush (NS) 0.9 % injection 3 mL (3 mLs Intravenous Not Given 05/17/23 2131)  cefTRIAXone (ROCEPHIN) 2 g in sodium chloride 0.9 % 100 mL IVPB (has no administration in time range)  azithromycin (ZITHROMAX) 500 mg in sodium chloride 0.9 % 250 mL IVPB (has no administration in time range)  insulin aspart (novoLOG) injection 0-9 Units (has no administration in time range)  insulin aspart (novoLOG) injection 0-5 Units ( Subcutaneous Not Given 05/17/23 2132)  ALPRAZolam (XANAX) tablet 0.25 mg (has no administration in time range)  divalproex (DEPAKOTE) DR tablet 250 mg (has no administration in time range)  rosuvastatin (CRESTOR) tablet 5 mg (0 mg Oral Hold 05/17/23 2040)  clopidogrel (PLAVIX) tablet 75 mg (0 mg Oral Hold 05/17/23 2040)  pantoprazole (PROTONIX) EC tablet 40 mg (0 mg Oral Hold 05/17/23 2040)  valproate (DEPACON) 500 mg in dextrose 5 % 50 mL IVPB (has no administration in time range)  divalproex (DEPAKOTE) DR tablet 500 mg (has no administration in time range)  levothyroxine (SYNTHROID) tablet 88 mcg (has no administration in time range)  lactated ringers bolus  1,000 mL (1,000 mLs Intravenous Bolus 05/17/23 1139)  iohexol (OMNIPAQUE) 300 MG/ML solution 100 mL (100 mLs Intravenous Contrast Given 05/17/23 1448)  cefTRIAXone (ROCEPHIN) 2 g in sodium chloride 0.9 % 100 mL IVPB (0 g Intravenous Stopped 05/17/23 1804)  azithromycin (ZITHROMAX) 500 mg in sodium chloride 0.9 % 250 mL IVPB (0 mg  Intravenous Stopped 05/17/23 1844)    Mobility non-ambulatory - ambulatory, currently with physical therapy.      Focused Assessments Neuro Assessment Handoff:  Initially, pt was increasingly sleepy. D/t concerns for aspiration pt was placed NPO with SLP pending.          Neuro Assessment: Exceptions to [facility] Neuro Checks:      If patient is a Neuro Trauma and patient is going to OR before floor call report to 4N Charge nurse: [phone] or [phone]   R Recommendations: See Admitting Provider Note  Report given to:   Additional Notes: -

## 2023-05-17 NOTE — ED Notes (Signed)
Mouth care performed. Moisturizer applied. Kellogg RN

## 2023-05-17 NOTE — Assessment & Plan Note (Signed)
Has been going on for 2 days manifesting as lethargy, not eating drinking.  I believe this is likely due to hypoxia that was initially noted on presentation of the patient.  We will continue with oxygen as needed.  Unfortunately in spite of this patient's mentation is still to improve fully.  It is noted that the patient's urine tox occasion panel is showing benzodiazepines, this can slowly be cut down.

## 2023-05-18 DIAGNOSIS — J189 Pneumonia, unspecified organism: Secondary | ICD-10-CM | POA: Diagnosis not present

## 2023-05-18 DIAGNOSIS — G9340 Encephalopathy, unspecified: Secondary | ICD-10-CM | POA: Diagnosis not present

## 2023-05-18 LAB — BASIC METABOLIC PANEL
Anion gap: 9 (ref 5–15)
BUN: 19 mg/dL (ref 8–23)
CO2: 26 mmol/L (ref 22–32)
Calcium: 8.3 mg/dL — ABNORMAL LOW (ref 8.9–10.3)
Chloride: 105 mmol/L (ref 98–111)
Creatinine, Ser: 0.88 mg/dL (ref 0.44–1.00)
GFR, Estimated: >60 mL/min (ref >60)
Glucose, Bld: 100 mg/dL — ABNORMAL HIGH (ref 70–99)
Potassium: 4.1 mmol/L (ref 3.5–5.1)
Sodium: 140 mmol/L (ref 135–145)

## 2023-05-18 LAB — GLUCOSE, CAPILLARY
Glucose-Capillary: 87 mg/dL (ref 70–99)
Glucose-Capillary: 94 mg/dL (ref 70–99)
Glucose-Capillary: 101 mg/dL — ABNORMAL HIGH (ref 70–99)
Glucose-Capillary: 82 mg/dL (ref 70–99)

## 2023-05-18 LAB — CBC
HCT: 36.9 % (ref 36.0–46.0)
Hemoglobin: 11.8 g/dL — ABNORMAL LOW (ref 12.0–15.0)
MCH: 33.5 pg (ref 26.0–34.0)
MCHC: 32 g/dL (ref 30.0–36.0)
MCV: 104.8 fL — ABNORMAL HIGH (ref 80.0–100.0)
Platelets: 204 10*3/uL (ref 150–400)
RBC: 3.52 MIL/uL — ABNORMAL LOW (ref 3.87–5.11)
RDW: 15.8 % — ABNORMAL HIGH (ref 11.5–15.5)
WBC: 7.6 10*3/uL (ref 4.0–10.5)
nRBC: 0 % (ref 0.0–0.2)

## 2023-05-18 LAB — APTT: aPTT: 28 s (ref 24–36)

## 2023-05-18 LAB — VITAMIN B12: Vitamin B-12: 1407 pg/mL — ABNORMAL HIGH (ref 180–914)

## 2023-05-18 LAB — PROTIME-INR
INR: 1.1 (ref 0.8–1.2)
Prothrombin Time: 14 s (ref 11.4–15.2)

## 2023-05-18 MED ORDER — LEVOTHYROXINE SODIUM 75 MCG PO TABS
75.0000 ug | ORAL_TABLET | Freq: Every day | ORAL | Status: DC
  Administered 2023-05-19: 75 ug via ORAL
  Filled 2023-05-18: qty 1

## 2023-05-18 MED ORDER — ALBUTEROL SULFATE (2.5 MG/3ML) 0.083% IN NEBU: 2.5000 mg | INHALATION_SOLUTION | RESPIRATORY_TRACT | Status: DC | PRN

## 2023-05-18 NOTE — Progress Notes (Signed)
PROGRESS NOTE  Susan Davidson, is a 72 y.o. female, DOB - November 10, 1950, [id number]  Admit date - 05/17/2023   Admitting Physician Nolberto Hanlon, MD  Outpatient Primary MD for the patient is Corwin Levins, MD  LOS - 1  Chief Complaint  Patient presents with   Fatigue      Brief Narrative:   72 y.o. female with medical history significant of old stroke, HTN, dementia, hypothyroidism and DM2 admitted on 05/17/2023 with concerns for community-acquired pneumonia and acute metabolic encephalopathy     -Assessment and Plan: 1)Community Acquired Pneumonia----?? Aspiration related -Imaging studies noted with pneumonia -No fevers, no leukocytosis, -Continue Rocephin and azithromycin, bronchiodilators as ordered  2)Acute Hypoxic Resp Failure--suspect related to #1 above -Currently requiring 2 L via nasal cannula -Treated as above #1  3)Acute metabolic encephalopathy Ammonia is not elevated  -UDS with benzos which may be contributing to lethargy --Be judicious with benzos  4)Hypothyroidism TSH is low normal at 0.8  -Free T4 is elevated at 1.95 -Levothyroxine to 75 mcg from 88 mcg  5) history of strokes-- MRI brain with remote strokes, no new acute strokes continue Plavix and Crestor for secondary prevention  6) dementia--- appears to be progressive with poor oral intake lately -Continue supportive care and Depakote  7) dysphagia/FEN--patient with poor oral intake according to her husband, concerns for aspiration, speech pathologist recommends dysphagia 3 diet for now  8) generalized weakness--PTA patient will get an around-the-clock help at home -With pneumonia and hypoxia she appears to be weaker -Get PT eval  Status is: Inpatient   Disposition: The patient is from: Home              Anticipated d/c is to: SNF              Anticipated d/c date is: 2 days              Patient currently is not medically stable to d/c. Barriers: Not Clinically Stable-   Code Status :  -  Code  Status: Full Code   Family Communication:   Discussed with Husband at bedside  DVT Prophylaxis  :   - SCDs / SCDs Start: 05/17/23 1758   Lab Results  Component Value Date   PLT 204 05/18/2023   Inpatient Medications  Scheduled Meds:  clopidogrel  75 mg Oral Daily   divalproex  250 mg Oral Daily   divalproex  500 mg Oral QHS   insulin aspart  0-5 Units Subcutaneous QHS   insulin aspart  0-9 Units Subcutaneous TID WC   levothyroxine  88 mcg Oral Q0600   pantoprazole  40 mg Oral Daily   rosuvastatin  5 mg Oral Daily   sodium chloride flush  3 mL Intravenous Q12H   Continuous Infusions:  azithromycin     cefTRIAXone (ROCEPHIN)  IV 2 g (05/18/23 1659)   PRN Meds:.acetaminophen **OR** acetaminophen, ALPRAZolam, polyethylene glycol   Anti-infectives (From admission, onward)    Start     Dose/Rate Route Frequency Ordered Stop   05/18/23 1800  azithromycin (ZITHROMAX) 500 mg in sodium chloride 0.9 % 250 mL IVPB        500 mg 250 mL/hr over 60 Minutes Intravenous Every 24 hours 05/17/23 1759 05/20/23 1759   05/18/23 1700  cefTRIAXone (ROCEPHIN) 2 g in sodium chloride 0.9 % 100 mL IVPB        2 g 200 mL/hr over 30 Minutes Intravenous Every 24 hours 05/17/23 1759 05/23/23 1659   05/17/23 1745  cefTRIAXone (ROCEPHIN) 2 g in sodium chloride 0.9 % 100 mL IVPB        2 g 200 mL/hr over 30 Minutes Intravenous  Once 05/17/23 1737 05/17/23 1804   05/17/23 1745  azithromycin (ZITHROMAX) 500 mg in sodium chloride 0.9 % 250 mL IVPB        500 mg 250 mL/hr over 60 Minutes Intravenous  Once 05/17/23 1737 05/17/23 1844         Subjective: Susan Davidson today has no fevers, no emesis,  No chest pain,   - Husband at bedside questions answered -Patient is cooperative  Objective: Vitals:   05/18/23 0426 05/18/23 0902 05/18/23 0944 05/18/23 1330  BP: (!) 129/100 130/60 (!) 115/51 (!) 141/66  Pulse: 81 84 86 84  Resp: 15 18 17 17   Temp: 98.6 F (37 C) 98.5 F (36.9 C) 99.1 F (37.3  C) 98.3 F (36.8 C)  TempSrc:  Axillary Oral Oral  SpO2: 96% 94% 95% 98%  Weight:      Height:        Intake/Output Summary (Last 24 hours) at 05/18/2023 1713 Last data filed at 05/18/2023 1543 Gross per 24 hour  Intake 0 ml  Output --  Net 0 ml   Filed Weights   05/17/23 1126  Weight: 63.5 kg    Physical Exam  Gen:- Awake Alert, in no acute distress HEENT:- [place].AT, No sclera icterus Nose- [place] 2L/min Neck-Supple Neck,No JVD,.  Lungs-no significant wheezing, fair symmetrical air movement CV- S1, S2 normal, regular  Abd-  +ve B.Sounds, Abd Soft, No tenderness,    Extremity/Skin:- No  edema, pedal pulses present  Psych-obvious cognitive and memory deficits consistent with at least moderate dementia Neuro-generalized weakness, no new focal deficits, no tremors  Data Reviewed: I have personally reviewed following labs and imaging studies  CBC: Recent Labs  Lab 05/17/23 1146 05/18/23 0522  WBC 9.2 7.6  NEUTROABS 6.6  --   HGB 13.6 11.8*  HCT 41.6 36.9  MCV 103.0* 104.8*  PLT 235 204   Basic Metabolic Panel: Recent Labs  Lab 05/17/23 1146 05/18/23 0522  NA 137 140  K 3.9 4.1  CL 102 105  CO2 23 26  GLUCOSE 117* 100*  BUN 22 19  CREATININE 1.10* 0.88  CALCIUM 8.6* 8.3*   GFR: Estimated Creatinine Clearance: 49.1 mL/min (by C-G formula based on SCr of 0.88 mg/dL). Liver Function Tests: Recent Labs  Lab 05/17/23 1146  AST 16  ALT 14  ALKPHOS 59  BILITOT 0.7  PROT 7.4  ALBUMIN 3.3*   Cardiac Enzymes: Recent Labs  Lab 05/17/23 1146  CKTOTAL 37*   Recent Results (from the past 240 hour(s))  Resp panel by RT-PCR (RSV, Flu A&B, Covid) Urine, Clean Catch     Status: None   Collection Time: 05/17/23 11:39 AM   Specimen: Urine, Clean Catch; Nasal Swab  Result Value Ref Range Status   SARS Coronavirus 2 by RT PCR NEGATIVE NEGATIVE Final    Comment: (NOTE) SARS-CoV-2 target nucleic acids are NOT DETECTED.  The SARS-CoV-2 RNA is generally detectable in  upper respiratory specimens during the acute phase of infection. The lowest concentration of SARS-CoV-2 viral copies this assay can detect is 138 copies/mL. A negative result does not preclude SARS-Cov-2 infection and should not be used as the sole basis for treatment or other patient management decisions. A negative result may occur with  improper specimen collection/handling, submission of specimen other than nasopharyngeal swab, presence of viral mutation(s) within the  areas targeted by this assay, and inadequate number of viral copies(<138 copies/mL). A negative result must be combined with clinical observations, patient history, and epidemiological information. The expected result is Negative.  Fact Sheet for Patients:  BloggerCourse.com  Fact Sheet for Healthcare Providers:  SeriousBroker.it  This test is no t yet approved or cleared by the [place] FDA and  has been authorized for detection and/or diagnosis of SARS-CoV-2 by FDA under an Emergency Use Authorization (EUA). This EUA will remain  in effect (meaning this test can be used) for the duration of the COVID-19 declaration under Section 564(b)(1) of the Act, 21 U.S.C.section 360bbb-3(b)(1), unless the authorization is terminated  or revoked sooner.       Influenza A by PCR NEGATIVE NEGATIVE Final   Influenza B by PCR NEGATIVE NEGATIVE Final    Comment: (NOTE) The Xpert Xpress SARS-CoV-2/FLU/RSV plus assay is intended as an aid in the diagnosis of influenza from Nasopharyngeal swab specimens and should not be used as a sole basis for treatment. Nasal washings and aspirates are unacceptable for Xpert Xpress SARS-CoV-2/FLU/RSV testing.  Fact Sheet for Patients: BloggerCourse.com  Fact Sheet for Healthcare Providers: SeriousBroker.it  This test is not yet approved or cleared by the [place] FDA and has been  authorized for detection and/or diagnosis of SARS-CoV-2 by FDA under an Emergency Use Authorization (EUA). This EUA will remain in effect (meaning this test can be used) for the duration of the COVID-19 declaration under Section 564(b)(1) of the Act, 21 U.S.C. section 360bbb-3(b)(1), unless the authorization is terminated or revoked.     Resp Syncytial Virus by PCR NEGATIVE NEGATIVE Final    Comment: (NOTE) Fact Sheet for Patients: BloggerCourse.com  Fact Sheet for Healthcare Providers: SeriousBroker.it  This test is not yet approved or cleared by the [place] FDA and has been authorized for detection and/or diagnosis of SARS-CoV-2 by FDA under an Emergency Use Authorization (EUA). This EUA will remain in effect (meaning this test can be used) for the duration of the COVID-19 declaration under Section 564(b)(1) of the Act, 21 U.S.C. section 360bbb-3(b)(1), unless the authorization is terminated or revoked.  Performed at [facility], [address], [place], [place] [address]       Radiology Studies: MR BRAIN WO CONTRAST  Result Date: 05/17/2023 CLINICAL DATA:  Neuro deficit, acute, stroke suspected. EXAM: MRI HEAD WITHOUT CONTRAST TECHNIQUE: Multiplanar, multiecho pulse sequences of the brain and surrounding structures were obtained without intravenous contrast. COMPARISON:  CT head without contrast 05/16/2013. MR head without contrast 01/18/2022. MR angiogram 03/06/2023. FINDINGS: Brain: No acute infarct, hemorrhage, or mass lesion is present. A remote right ACA territory infarct is noted. Periventricular and scattered subcortical T2 hyperintensities are otherwise mildly advanced for age. Remote lacunar infarcts are present within the basal ganglia bilaterally. Remote lacunar infarcts are present in the thalami, left greater than right. Remote lacunar infarcts are present in the cerebellum bilaterally. The brainstem is within  normal limits. The internal auditory canals are within normal limits. Midline structures are within normal limits. Vascular: Flow is present in the major intracranial arteries. 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 Skull and upper cervical spine: The craniocervical junction is normal. Upper cervical spine is within normal limits. Marrow signal is unremarkable. Sinuses/Orbits: The left sphenoid sinus is opacified. The paranasal sinuses and mastoid air cells are otherwise clear. The globes and orbits are within normal limits. Other: IMPRESSION: 1. No acute intracranial abnormality. 2. Remote right ACA territory infarct. 3.  Remote lacunar infarcts of the basal ganglia, thalami, and cerebellum bilaterally. 4. Periventricular and scattered subcortical T2 hyperintensities are otherwise mildly advanced for age. This likely reflects the sequela of chronic microvascular ischemia. 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 Electronically Signed   By: Marin Roberts M.D.   On: 05/17/2023 17:16   CT CHEST ABDOMEN PELVIS W CONTRAST  Result Date: 05/17/2023 CLINICAL DATA:  Altered mental status. Failure to thrive. Lethargy. Decreased oral intake at home. EXAM: CT CHEST, ABDOMEN, AND PELVIS WITH CONTRAST TECHNIQUE: Multidetector CT imaging of the chest, abdomen and pelvis was performed following the standard protocol during bolus administration of intravenous contrast. RADIATION DOSE REDUCTION: This exam was performed according to the departmental dose-optimization program which includes automated exposure control, adjustment of the mA and/or kV according to patient size and/or use of iterative reconstruction technique. CONTRAST:  OMNIPAQUE IOHEXOL 300 MG/ML  SOLN COMPARISON:  Chest CT angiogram November 2022. Chest x-ray 05/17/2023. FINDINGS: CT CHEST FINDINGS Cardiovascular: Heart is nonenlarged. No pericardial effusion. The thoracic aorta has a normal course and caliber with vascular  calcifications. Coronary artery calcifications are seen. Mediastinum/Nodes: Slightly patulous thoracic esophagus with some luminal air and fluid. Small thyroid gland. No specific abnormal lymph node enlargement identified in the axillary regions, hilum or mediastinum. Only exception is a subcarinal node on the right side measuring on series 3, image 24 at 2.2 by 1.0 cm. This has not seen on the prior examination Lungs/Pleura: Breathing motion identified. Consolidative opacity seen along the right lower lobe with some volume loss. There is some patchy opacity in the left lower lobe as well. Acute infiltrate is possible. There are some areas of bronchial wall thickening. There are some dependent ground-glass in the upper lobes. No pneumothorax or effusion. Musculoskeletal: Curvature of the spine. Scattered degenerative changes. Osteopenia. Elevation of the right hemidiaphragm. CT ABDOMEN PELVIS FINDINGS Hepatobiliary: Patent portal vein. No space-occupying enhancing liver lesion. Gallbladder is mildly distended Pancreas: Moderate global atrophy of the pancreas. There is a small cystic lesion identified along the uncinate process on series 3, image 66 measuring 9 mm in maximal dimension. Spleen: Splenic punctate calcified granuloma. The spleen is nonenlarged Adrenals/Urinary Tract: Adrenal glands are preserved. No enhancing renal mass. No collecting system dilatation. Parapelvic renal cysts are identified on the left. The ureters have normal course and caliber extending down to the bladder. Contracted urinary bladder. Stomach/Bowel: The large bowel has a normal course and caliber with scattered stool. Contracted stomach. Small bowel is nondilated. Normal caliber appendix extends medial to the cecum in the right lower quadrant. Vascular/Lymphatic: Diffuse vascular calcifications. This includes the branch vessels. There are areas of potential high-grade stenosis along the renal arteries. Please correlate for any symptoms  in the level of hypertension normal caliber IVC. No specific abnormal lymph node enlargement identified in the abdomen and pelvis. Reproductive: Uterus and bilateral adnexa are unremarkable. Other: No abdominal wall hernia or abnormality. No abdominopelvic ascites. Musculoskeletal: Curvature of the spine with degenerative changes. Degenerative changes as well along the pelvis. Osteopenia. There is also streak artifact as the patient's left arm was scanned while at the patient's side. IMPRESSION: Bilateral lower lobe consolidative lung opacities, right-greater-than-left. Acute infiltrate. Recommend follow-up. There is also an enlarged subcarinal lymph node which could be reactive and attention on follow up as well. Motion artifact. No bowel obstruction, free air or free fluid. Scattered stool. Normal appendix. Diffuse atherosclerotic changes. 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 Please correlate with any particular symptoms.  Elevated right hemidiaphragm. Electronically Signed   By: Karen Kays M.D.   On: 05/17/2023 15:57   CT Head Wo Contrast  Result Date: 05/17/2023 CLINICAL DATA:  Delirium EXAM: CT HEAD WITHOUT CONTRAST TECHNIQUE: Contiguous axial images were obtained from the base of the skull through the vertex without intravenous contrast. RADIATION DOSE REDUCTION: This exam was performed according to the departmental dose-optimization program which includes automated exposure control, adjustment of the mA and/or kV according to patient size and/or use of iterative reconstruction technique. COMPARISON:  CT Head 08/02/21 FINDINGS: Brain: No evidence of acute infarction, hemorrhage, hydrocephalus, extra-axial collection or mass lesion/mass effect. Chronic infarcts in the right frontoparietal region and right cerebellum. Vascular: No hyperdense vessel or unexpected calcification. Skull: Normal. Negative for fracture or focal lesion. Severe degenerative changes of the left TMJ.  Sinuses/Orbits: No middle ear or mastoid effusion. Paranasal sinuses are clear. Orbits are unremarkable. Other: None. IMPRESSION: 1. No acute intracranial abnormality. 2. Chronic infarcts in the right frontoparietal region and right cerebellum. Electronically Signed   By: Lorenza Cambridge M.D.   On: 05/17/2023 13:41   DG Chest Portable 1 View  Result Date: 05/17/2023 CLINICAL DATA:  Altered mental status EXAM: PORTABLE CHEST 1 VIEW COMPARISON:  01/18/2022 FINDINGS: Underinflation. There is some linear opacity left lung base. Atelectasis is favored. No pneumothorax or effusion. Normal cardiopericardial silhouette. No edema. Osteopenia. Comminuted left clavicle fracture. Overlapping cardiac leads. IMPRESSION: Poor inflation.  Increasing left basilar atelectasis. Electronically Signed   By: Karen Kays M.D.   On: 05/17/2023 13:36    Scheduled Meds:  clopidogrel  75 mg Oral Daily   divalproex  250 mg Oral Daily   divalproex  500 mg Oral QHS   insulin aspart  0-5 Units Subcutaneous QHS   insulin aspart  0-9 Units Subcutaneous TID WC   levothyroxine  88 mcg Oral Q0600   pantoprazole  40 mg Oral Daily   rosuvastatin  5 mg Oral Daily   sodium chloride flush  3 mL Intravenous Q12H   Continuous Infusions:  azithromycin     cefTRIAXone (ROCEPHIN)  IV 2 g (05/18/23 1659)    LOS: 1 day   Shon Hale M.D on 05/18/2023 at 5:13 PM  Go to [email or web address] - for contact info  [facility] - Office  [phone]  If 7PM-7AM, please contact night-coverage [email or web address] 05/18/2023, 5:13 PM

## 2023-05-18 NOTE — ED Notes (Signed)
Receiving nurse's questions answered. Pt to report to dept 300.

## 2023-05-18 NOTE — Evaluation (Signed)
Clinical/Bedside Swallow Evaluation Patient Details  Name: Susan Davidson MRN: [id number] Date of Birth: 1950-12-11  Today's Date: 05/18/2023 Time: SLP Start Time (ACUTE ONLY): 1511 SLP Stop Time (ACUTE ONLY): 1542 SLP Time Calculation (min) (ACUTE ONLY): 31 min  Past Medical History:  Past Medical History:  Diagnosis Date   ANXIETY 02/12/2008   Qualifier: Diagnosis of  By: Jonny Ruiz MD, Len Blalock    CVA (cerebral vascular accident) [facility])    DEPRESSION 02/12/2008   Qualifier: Diagnosis of  By: Maris Berger    HYPERLIPIDEMIA 02/12/2008   Qualifier: Diagnosis of  By: Maris Berger    HYPERTENSION 02/12/2008   Qualifier: Diagnosis of  By: Maris Berger    HYPOTHYROIDISM 02/12/2008   Qualifier: Diagnosis of  By: Maris Berger    Impaired glucose tolerance 08/27/2011   Left hemiparesis (HCC)    OSTEOPENIA 02/12/2008   Qualifier: Diagnosis of  By: Jonny Ruiz MD, Len Blalock    VITAMIN D DEFICIENCY 04/14/2010   Qualifier: Diagnosis of  By: Jonny Ruiz MD, Len Blalock    Past Surgical History:  Past Surgical History:  Procedure Laterality Date   IR RADIOLOGIST EVAL & MGMT  08/18/2021   HPI:  Susan Davidson is a 72 y.o. female with medical history significant of old stroke, hypertension and known chronic cognitive disorder.  Patient is typically cared for by husband.  History is obtained from secondary sources due to patient's current presentation.     Approximately for 2 days patient has been noted to be not eating or drinking much, much more lethargic at home.  This prompted the patient to be brought to the ER by family     Patient is noted to be hypoxic on initial evaluation in the ER.  Further workup as noted below.  Medical evaluation is sought. Bilateral lower lobe consolidative lung opacities,  right-greater-than-left. Acute infiltrate.  BSE requested.    Assessment / Plan / Recommendation  Clinical Impression  Clinical swallow evaluation completed at bedside. Pt  with mild left facial asymmetry from a previous stroke. She presents with reduced vocal intensity and weak cough. She reports no difficulty swallowing, but tends to avoid meats. She self presented cup (hand over hand assist) and straw sips thin liquids and ultimately consumed 340 ml water without overt signs of aspiration. She only accepted small amounts of puree and crackers and demonstrated prolonged oral transit with soilds, but did clear. Recommend D3//mech soft with thin liquids and pills whole with water with standard aspiration and reflux precautions and SLP to follow  during acute stay for diet tolerance and education as needed. SLP Visit Diagnosis: Dysphagia, unspecified (R13.10)    Aspiration Risk  Mild aspiration risk    Diet Recommendation Dysphagia 3 (Mech soft);Thin liquid    Liquid Administration via: Cup;Straw Medication Administration: Whole meds with liquid Supervision: Staff to assist with self feeding Compensations: Slow rate Postural Changes: Seated upright at 90 degrees;Remain upright for at least 30 minutes after po intake    Other  Recommendations Oral Care Recommendations: Oral care BID;Staff/trained caregiver to provide oral care    Recommendations for follow up therapy are one component of a multi-disciplinary discharge planning process, led by the attending physician.  Recommendations may be updated based on patient status, additional functional criteria and insurance authorization.  Follow up Recommendations Follow physician's recommendations for discharge plan and follow up therapies      Assistance Recommended at Discharge    Functional Status Assessment Patient has had a  recent decline in their functional status and demonstrates the ability to make significant improvements in function in a reasonable and predictable amount of time.  Frequency and Duration min 2x/week  1 week       Prognosis Prognosis for improved oropharyngeal function: Good       Swallow Study   General Date of Onset: 05/17/23 HPI: Susan Davidson is a 72 y.o. female with medical history significant of old stroke, hypertension and known chronic cognitive disorder.  Patient is typically cared for by husband.  History is obtained from secondary sources due to patient's current presentation.     Approximately for 2 days patient has been noted to be not eating or drinking much, much more lethargic at home.  This prompted the patient to be brought to the ER by family     Patient is noted to be hypoxic on initial evaluation in the ER.  Further workup as noted below.  Medical evaluation is sought. Bilateral lower lobe consolidative lung opacities,  right-greater-than-left. Acute infiltrate.  BSE requested. Type of Study: Bedside Swallow Evaluation Diet Prior to this Study: NPO Temperature Spikes Noted: No Respiratory Status: Nasal cannula History of Recent Intubation: No Behavior/Cognition: Alert;Cooperative;Pleasant mood Oral Cavity Assessment: Within Functional Limits Oral Care Completed by SLP: Yes Oral Cavity - Dentition: Adequate natural dentition;Poor condition Vision: Functional for self-feeding Self-Feeding Abilities: Needs assist;Needs set up Patient Positioning: Upright in bed Baseline Vocal Quality: Normal;Low vocal intensity Volitional Cough: Weak Volitional Swallow: Able to elicit    Oral/Motor/Sensory Function Overall Oral Motor/Sensory Function: Mild impairment Facial ROM: Reduced left (previous stroke) Facial Symmetry: Abnormal symmetry left Facial Strength: Reduced left Lingual ROM: Within Functional Limits Lingual Symmetry: Within Functional Limits Lingual Strength: Within Functional Limits Lingual Sensation: Within Functional Limits Velum: Within Functional Limits Mandible: Within Functional Limits   Ice Chips Ice chips: Within functional limits Presentation: Spoon   Thin Liquid Thin Liquid: Within functional limits Presentation: Cup;Self  Fed;Straw    Nectar Thick Nectar Thick Liquid: Not tested   Honey Thick Honey Thick Liquid: Not tested   Puree Puree: Within functional limits Presentation: Spoon   Solid     Solid: Impaired Oral Phase Impairments: Other (comment) (prolonged) Oral Phase Functional Implications: Prolonged oral transit     Thank you,  Havery Moros, CCC-SLP [phone]  , 05/18/2023,3:55 PM

## 2023-05-18 NOTE — ED Notes (Signed)
Dept 300 notified patient will be heading up to room. Spoke to [employer]

## 2023-05-18 NOTE — ED Notes (Signed)
ED TO INPATIENT HANDOFF REPORT  ED Nurse Name and Phone #: Lenox Ponds Name/Age/Gender Susan Davidson 72 y.o. female Room/Bed: APAH1/APAH1  Code Status   Code Status: Full Code  Home/SNF/Other Home Patient oriented to: self Is this baseline? No   Triage Complete: Triage complete  Chief Complaint Community acquired pneumonia [J18.9]  Triage Note Pt brought in from home by [facility] for lethargy and failure to thrive. Pt has been decreasing oral intake at home per EMS from spouse and becoming more lethargic. Pt has hx of CVAs.    Allergies Allergies  Allergen Reactions   Aleve [Naproxen] Nausea Only   Other Anaphylaxis    Fire ant venom   Covid-19 Mrna Vaccine [employer]) [Covid-19 Mrna Vacc (Moderna)]     Just the first round of moderna Covid vaccine, sluggish, uncoordinated, and weak, similar to flu.   Epinephrine    Fire [employer] (Solenopsis [place])    Influenza Vac Split Quad     Fatigue, mimics a "super bad flu"    Level of Care/Admitting Diagnosis ED Disposition     ED Disposition  Admit   Condition  --   Comment  Hospital Area: [facility] [[id number]]  Level of Care: Med-Surg [16]  Covid Evaluation: Symptomatic Person Under Investigation (PUI) or recent exposure (last 10 days) *Testing Required*  Diagnosis: Community acquired pneumonia [295621]  Admitting Physician: Nolberto Hanlon [[id number]]  Attending Physician: Nolberto Hanlon [[id number]]  Certification:: I certify this patient will need inpatient services for at least 2 midnights  Expected Medical Readiness: 05/20/2023          B Medical/Surgery History Past Medical History:  Diagnosis Date   ANXIETY 02/12/2008   Qualifier: Diagnosis of  By: Jonny Ruiz MD, Len Blalock    CVA (cerebral vascular accident) [facility])    DEPRESSION 02/12/2008   Qualifier: Diagnosis of  By: Maris Berger    HYPERLIPIDEMIA 02/12/2008   Qualifier: Diagnosis of  By: Maris Berger    HYPERTENSION 02/12/2008   Qualifier:  Diagnosis of  By: Maris Berger    HYPOTHYROIDISM 02/12/2008   Qualifier: Diagnosis of  By: Maris Berger    Impaired glucose tolerance 08/27/2011   Left hemiparesis (HCC)    OSTEOPENIA 02/12/2008   Qualifier: Diagnosis of  By: Jonny Ruiz MD, Len Blalock    VITAMIN D DEFICIENCY 04/14/2010   Qualifier: Diagnosis of  By: Jonny Ruiz MD, Len Blalock    Past Surgical History:  Procedure Laterality Date   IR RADIOLOGIST EVAL & MGMT  08/18/2021     A IV Location/Drains/Wounds Patient Lines/Drains/Airways Status     Active Line/Drains/Airways     Name Placement date Placement time Site Days   Peripheral IV 05/17/23 20 G Posterior;Right Hand 05/17/23  1116  Hand  1   Pressure Injury 01/25/22 Heel Right Stage 1 -  Intact skin with non-blanchable redness of a localized area usually over a bony prominence. 3cm x 4cm - area boggy to touch - foam dressing in place - heels floated to relieve pressure 01/25/22  0800  -- 478            Intake/Output Last 24 hours No intake or output data in the 24 hours ending 05/18/23 0707  Labs/Imaging Results for orders placed or performed during the hospital encounter of 05/17/23 (from the past 48 hour(s))  Urinalysis, Routine w reflex microscopic -Urine, Clean Catch     Status: Abnormal   Collection Time: 05/17/23 11:38 AM  Result Value Ref Range  Color, Urine AMBER (A) YELLOW    Comment: BIOCHEMICALS MAY BE AFFECTED BY COLOR   APPearance HAZY (A) CLEAR   Specific Gravity, Urine 1.026 1.005 - 1.030   pH 5.0 5.0 - 8.0   Glucose, UA NEGATIVE NEGATIVE mg/dL   Hgb urine dipstick NEGATIVE NEGATIVE   Bilirubin Urine NEGATIVE NEGATIVE   Ketones, ur 20 (A) NEGATIVE mg/dL   Protein, ur 30 (A) NEGATIVE mg/dL   Nitrite NEGATIVE NEGATIVE   Leukocytes,Ua NEGATIVE NEGATIVE   RBC / HPF 6-10 0 - 5 RBC/hpf   WBC, UA 0-5 0 - 5 WBC/hpf   Bacteria, UA RARE (A) NONE SEEN   Squamous Epithelial / HPF 0-5 0 - 5 /HPF   Mucus PRESENT     Comment: Performed at  [facility], [address], [place], [place] [address]  Rapid urine drug screen (hospital performed)     Status: Abnormal   Collection Time: 05/17/23 11:38 AM  Result Value Ref Range   Opiates NONE DETECTED NONE DETECTED   Cocaine NONE DETECTED NONE DETECTED   Benzodiazepines POSITIVE (A) NONE DETECTED   Amphetamines NONE DETECTED NONE DETECTED   Tetrahydrocannabinol NONE DETECTED NONE DETECTED   Barbiturates NONE DETECTED NONE DETECTED    Comment: (NOTE) DRUG SCREEN FOR MEDICAL PURPOSES ONLY.  IF CONFIRMATION IS NEEDED FOR ANY PURPOSE, NOTIFY LAB WITHIN 5 DAYS.  LOWEST DETECTABLE LIMITS FOR URINE DRUG SCREEN Drug Class                     Cutoff (ng/mL) Amphetamine and metabolites    1000 Barbiturate and metabolites    200 Benzodiazepine                 200 Opiates and metabolites        300 Cocaine and metabolites        300 THC                            50 Performed at [facility], [address], [place], [place] [address]   Resp panel by RT-PCR (RSV, Flu A&B, Covid) Urine, Clean Catch     Status: None   Collection Time: 05/17/23 11:39 AM   Specimen: Urine, Clean Catch; Nasal Swab  Result Value Ref Range   SARS Coronavirus 2 by RT PCR NEGATIVE NEGATIVE    Comment: (NOTE) SARS-CoV-2 target nucleic acids are NOT DETECTED.  The SARS-CoV-2 RNA is generally detectable in upper respiratory specimens during the acute phase of infection. The lowest concentration of SARS-CoV-2 viral copies this assay can detect is 138 copies/mL. A negative result does not preclude SARS-Cov-2 infection and should not be used as the sole basis for treatment or other patient management decisions. A negative result may occur with  improper specimen collection/handling, submission of specimen other than nasopharyngeal swab, presence of viral mutation(s) within the areas targeted by this assay, and inadequate number of viral copies(<138 copies/mL). A negative result must be combined with clinical  observations, patient history, and epidemiological information. The expected result is Negative.  Fact Sheet for Patients:  BloggerCourse.com  Fact Sheet for Healthcare Providers:  SeriousBroker.it  This test is no t yet approved or cleared by the [place] FDA and  has been authorized for detection and/or diagnosis of SARS-CoV-2 by FDA under an Emergency Use Authorization (EUA). This EUA will remain  in effect (meaning this test can be used) for the duration of the COVID-19 declaration under  Section 564(b)(1) of the Act, 21 U.S.C.section 360bbb-3(b)(1), unless the authorization is terminated  or revoked sooner.       Influenza A by PCR NEGATIVE NEGATIVE   Influenza B by PCR NEGATIVE NEGATIVE    Comment: (NOTE) The Xpert Xpress SARS-CoV-2/FLU/RSV plus assay is intended as an aid in the diagnosis of influenza from Nasopharyngeal swab specimens and should not be used as a sole basis for treatment. Nasal washings and aspirates are unacceptable for Xpert Xpress SARS-CoV-2/FLU/RSV testing.  Fact Sheet for Patients: BloggerCourse.com  Fact Sheet for Healthcare Providers: SeriousBroker.it  This test is not yet approved or cleared by the [place] FDA and has been authorized for detection and/or diagnosis of SARS-CoV-2 by FDA under an Emergency Use Authorization (EUA). This EUA will remain in effect (meaning this test can be used) for the duration of the COVID-19 declaration under Section 564(b)(1) of the Act, 21 U.S.C. section 360bbb-3(b)(1), unless the authorization is terminated or revoked.     Resp Syncytial Virus by PCR NEGATIVE NEGATIVE    Comment: (NOTE) Fact Sheet for Patients: BloggerCourse.com  Fact Sheet for Healthcare Providers: SeriousBroker.it  This test is not yet approved or cleared by the [place]  FDA and has been authorized for detection and/or diagnosis of SARS-CoV-2 by FDA under an Emergency Use Authorization (EUA). This EUA will remain in effect (meaning this test can be used) for the duration of the COVID-19 declaration under Section 564(b)(1) of the Act, 21 U.S.C. section 360bbb-3(b)(1), unless the authorization is terminated or revoked.  Performed at [facility], [address] [place], [place] [address]   CBC with Differential     Status: Abnormal   Collection Time: 05/17/23 11:46 AM  Result Value Ref Range   WBC 9.2 4.0 - 10.5 K/uL   RBC 4.04 3.87 - 5.11 MIL/uL   Hemoglobin 13.6 12.0 - 15.0 g/dL   HCT 72.5 36.6 - 44.0 %   MCV 103.0 (H) 80.0 - 100.0 fL   MCH 33.7 26.0 - 34.0 pg   MCHC 32.7 30.0 - 36.0 g/dL   RDW 34.7 (H) 42.5 - 95.6 %   Platelets 235 150 - 400 K/uL   nRBC 0.0 0.0 - 0.2 %   Neutrophils Relative % 72 %   Neutro Abs 6.6 1.7 - 7.7 K/uL   Lymphocytes Relative 16 %   Lymphs Abs 1.5 0.7 - 4.0 K/uL   Monocytes Relative 11 %   Monocytes Absolute 1.0 0.1 - 1.0 K/uL   Eosinophils Relative 0 %   Eosinophils Absolute 0.0 0.0 - 0.5 K/uL   Basophils Relative 1 %   Basophils Absolute 0.1 0.0 - 0.1 K/uL   Immature Granulocytes 0 %   Abs Immature Granulocytes 0.03 0.00 - 0.07 K/uL    Comment: Performed at [facility], [address] [place], [place] [address]  Comprehensive metabolic panel     Status: Abnormal   Collection Time: 05/17/23 11:46 AM  Result Value Ref Range   Sodium 137 135 - 145 mmol/L   Potassium 3.9 3.5 - 5.1 mmol/L   Chloride 102 98 - 111 mmol/L   CO2 23 22 - 32 mmol/L   Glucose, Bld 117 (H) 70 - 99 mg/dL    Comment: Glucose reference range applies only to samples taken after fasting for at least 8 hours.   BUN 22 8 - 23 mg/dL   Creatinine, Ser 4.33 (H) 0.44 - 1.00 mg/dL   Calcium 8.6 (L) 8.9 - 10.3 mg/dL   Total Protein  7.4 6.5 - 8.1 g/dL   Albumin 3.3 (L) 3.5 - 5.0 g/dL   AST 16 15 - 41 U/L   ALT 14 0 - 44 U/L   Alkaline  Phosphatase 59 38 - 126 U/L   Total Bilirubin 0.7 0.3 - 1.2 mg/dL   GFR, Estimated 53 (L) >60 mL/min    Comment: (NOTE) Calculated using the CKD-EPI Creatinine Equation (2021)    Anion gap 12 5 - 15    Comment: Performed at [facility], [address] [place], [place] [address]  Lipase, blood     Status: None   Collection Time: 05/17/23 11:46 AM  Result Value Ref Range   Lipase 19 11 - 51 U/L    Comment: Performed at [facility], [address] [place], [place] [address]  Ammonia     Status: None   Collection Time: 05/17/23 11:46 AM  Result Value Ref Range   Ammonia 31 9 - 35 umol/L    Comment: Performed at [facility], [address], [place], [place] [address]  CK     Status: Abnormal   Collection Time: 05/17/23 11:46 AM  Result Value Ref Range   Total CK 37 (L) 38 - 234 U/L    Comment: Performed at [facility], [address] [place], [place] [address]  TSH     Status: None   Collection Time: 05/17/23 11:46 AM  Result Value Ref Range   TSH 0.839 0.350 - 4.500 uIU/mL    Comment: Performed by a 3rd Generation assay with a functional sensitivity of <=0.01 uIU/mL. Performed at [facility], [address], [place], [place] [address]   T4, free     Status: Abnormal   Collection Time: 05/17/23 11:46 AM  Result Value Ref Range   Free T4 1.95 (H) 0.61 - 1.12 ng/dL    Comment: (NOTE) Biotin ingestion may interfere with free T4 tests. If the results are inconsistent with the TSH level, previous test results, or the clinical presentation, then consider biotin interference. If needed, order repeat testing after stopping biotin. Performed at [facility], [address] [place], [place] [address]   Troponin I (High Sensitivity)     Status: None   Collection Time: 05/17/23 11:48 AM  Result Value Ref Range   Troponin I (High Sensitivity) 14 <18 ng/L    Comment: (NOTE) Elevated high sensitivity troponin I (hsTnI) values and significant  changes across serial measurements  may suggest ACS but many other  chronic and acute conditions are known to elevate hsTnI results.  Refer to the "Links" section for chest pain algorithms and additional  guidance. Performed at [facility], [address], [place], [place] [address]   Blood gas, venous (at [facility] and [facility])     Status: Abnormal   Collection Time: 05/17/23 12:58 PM  Result Value Ref Range   pH, Ven 7.33 7.25 - 7.43   pCO2, Ven 58 44 - 60 mmHg   pO2, Ven 34 32 - 45 mmHg   Bicarbonate 30.6 (H) 20.0 - 28.0 mmol/L   Acid-Base Excess 3.2 (H) 0.0 - 2.0 mmol/L   O2 Saturation 54.8 %   Patient temperature 37.2    Collection site LEFT ANTECUBITAL    Drawn by [phone]     Comment: Performed at [facility], [address], [place], [place] [address]  Troponin I (High Sensitivity)     Status: None   Collection Time: 05/17/23  1:58 PM  Result Value Ref Range  Troponin I (High Sensitivity) 8 <18 ng/L    Comment: (NOTE) Elevated high sensitivity troponin I (hsTnI) values and significant  changes across serial measurements may suggest ACS but many other  chronic and acute conditions are known to elevate hsTnI results.  Refer to the "Links" section for chest pain algorithms and additional  guidance. Performed at [facility], [address], [place], [place] [address]   CBG monitoring, ED     Status: None   Collection Time: 05/17/23  9:29 PM  Result Value Ref Range   Glucose-Capillary 96 70 - 99 mg/dL    Comment: Glucose reference range applies only to samples taken after fasting for at least 8 hours.  APTT     Status: None   Collection Time: 05/18/23  5:22 AM  Result Value Ref Range   aPTT 28 24 - 36 seconds    Comment: Performed at [facility], [address] [place], [place] [address]  Protime-INR     Status: None   Collection Time: 05/18/23  5:22 AM  Result Value Ref Range   Prothrombin Time 14.0 11.4 - 15.2 seconds   INR 1.1 0.8 - 1.2    Comment: (NOTE) INR goal varies based on device and disease  states. Performed at [facility], [address], [place], [place] [address]   Basic metabolic panel     Status: Abnormal   Collection Time: 05/18/23  5:22 AM  Result Value Ref Range   Sodium 140 135 - 145 mmol/L   Potassium 4.1 3.5 - 5.1 mmol/L   Chloride 105 98 - 111 mmol/L   CO2 26 22 - 32 mmol/L   Glucose, Bld 100 (H) 70 - 99 mg/dL    Comment: Glucose reference range applies only to samples taken after fasting for at least 8 hours.   BUN 19 8 - 23 mg/dL   Creatinine, Ser 9.14 0.44 - 1.00 mg/dL   Calcium 8.3 (L) 8.9 - 10.3 mg/dL   GFR, Estimated >78 >29 mL/min    Comment: (NOTE) Calculated using the CKD-EPI Creatinine Equation (2021)    Anion gap 9 5 - 15    Comment: Performed at [facility], [address], [place], [place] [address]  CBC     Status: Abnormal   Collection Time: 05/18/23  5:22 AM  Result Value Ref Range   WBC 7.6 4.0 - 10.5 K/uL   RBC 3.52 (L) 3.87 - 5.11 MIL/uL   Hemoglobin 11.8 (L) 12.0 - 15.0 g/dL   HCT 08.6 57.8 - 46.9 %   MCV 104.8 (H) 80.0 - 100.0 fL   MCH 33.5 26.0 - 34.0 pg   MCHC 32.0 30.0 - 36.0 g/dL   RDW 62.9 (H) 52.8 - 41.3 %   Platelets 204 150 - 400 K/uL   nRBC 0.0 0.0 - 0.2 %    Comment: Performed at [facility], [address], [place], [place] [address]  Vitamin B12     Status: Abnormal   Collection Time: 05/18/23  5:22 AM  Result Value Ref Range   Vitamin B-12 1,407 (H) 180 - 914 pg/mL    Comment: (NOTE) This assay is not validated for testing neonatal or myeloproliferative syndrome specimens for Vitamin B12 levels. Performed at [facility], [address], [place], [place] [address]    MR BRAIN WO CONTRAST  Result Date: 05/17/2023 CLINICAL DATA:  Neuro deficit, acute, stroke suspected. EXAM: MRI HEAD WITHOUT CONTRAST TECHNIQUE: Multiplanar, multiecho pulse sequences of the brain and surrounding structures were  obtained without intravenous contrast. COMPARISON:  CT head without contrast 05/16/2013. MR head without contrast  01/18/2022. MR angiogram 03/06/2023. FINDINGS: Brain: No acute infarct, hemorrhage, or mass lesion is present. A remote right ACA territory infarct is noted. Periventricular and scattered subcortical T2 hyperintensities are otherwise mildly advanced for age. Remote lacunar infarcts are present within the basal ganglia bilaterally. Remote lacunar infarcts are present in the thalami, left greater than right. Remote lacunar infarcts are present in the cerebellum bilaterally. The brainstem is within normal limits. The internal auditory canals are within normal limits. Midline structures are within normal limits. Vascular: Flow is present in the major intracranial arteries. 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 Skull and upper cervical spine: The craniocervical junction is normal. Upper cervical spine is within normal limits. Marrow signal is unremarkable. Sinuses/Orbits: The left sphenoid sinus is opacified. The paranasal sinuses and mastoid air cells are otherwise clear. The globes and orbits are within normal limits. Other: IMPRESSION: 1. No acute intracranial abnormality. 2. Remote right ACA territory infarct. 3. Remote lacunar infarcts of the basal ganglia, thalami, and cerebellum bilaterally. 4. Periventricular and scattered subcortical T2 hyperintensities are otherwise mildly advanced for age. This likely reflects the sequela of chronic microvascular ischemia. 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 Electronically Signed   By: Marin Roberts M.D.   On: 05/17/2023 17:16   CT CHEST ABDOMEN PELVIS W CONTRAST  Result Date: 05/17/2023 CLINICAL DATA:  Altered mental status. Failure to thrive. Lethargy. Decreased oral intake at home. EXAM: CT CHEST, ABDOMEN, AND PELVIS WITH CONTRAST TECHNIQUE: Multidetector CT imaging of the chest, abdomen and pelvis was performed following the standard protocol during bolus administration of intravenous contrast. RADIATION DOSE REDUCTION: This  exam was performed according to the departmental dose-optimization program which includes automated exposure control, adjustment of the mA and/or kV according to patient size and/or use of iterative reconstruction technique. CONTRAST:  OMNIPAQUE IOHEXOL 300 MG/ML  SOLN COMPARISON:  Chest CT angiogram November 2022. Chest x-ray 05/17/2023. FINDINGS: CT CHEST FINDINGS Cardiovascular: Heart is nonenlarged. No pericardial effusion. The thoracic aorta has a normal course and caliber with vascular calcifications. Coronary artery calcifications are seen. Mediastinum/Nodes: Slightly patulous thoracic esophagus with some luminal air and fluid. Small thyroid gland. No specific abnormal lymph node enlargement identified in the axillary regions, hilum or mediastinum. Only exception is a subcarinal node on the right side measuring on series 3, image 24 at 2.2 by 1.0 cm. This has not seen on the prior examination Lungs/Pleura: Breathing motion identified. Consolidative opacity seen along the right lower lobe with some volume loss. There is some patchy opacity in the left lower lobe as well. Acute infiltrate is possible. There are some areas of bronchial wall thickening. There are some dependent ground-glass in the upper lobes. No pneumothorax or effusion. Musculoskeletal: Curvature of the spine. Scattered degenerative changes. Osteopenia. Elevation of the right hemidiaphragm. CT ABDOMEN PELVIS FINDINGS Hepatobiliary: Patent portal vein. No space-occupying enhancing liver lesion. Gallbladder is mildly distended Pancreas: Moderate global atrophy of the pancreas. There is a small cystic lesion identified along the uncinate process on series 3, image 66 measuring 9 mm in maximal dimension. Spleen: Splenic punctate calcified granuloma. The spleen is nonenlarged Adrenals/Urinary Tract: Adrenal glands are preserved. No enhancing renal mass. No collecting system dilatation. Parapelvic renal cysts are identified on the left. The  ureters have normal course and caliber extending down to the bladder. Contracted urinary bladder. Stomach/Bowel: The large bowel has a normal course and  caliber with scattered stool. Contracted stomach. Small bowel is nondilated. Normal caliber appendix extends medial to the cecum in the right lower quadrant. Vascular/Lymphatic: Diffuse vascular calcifications. This includes the branch vessels. There are areas of potential high-grade stenosis along the renal arteries. Please correlate for any symptoms in the level of hypertension normal caliber IVC. No specific abnormal lymph node enlargement identified in the abdomen and pelvis. Reproductive: Uterus and bilateral adnexa are unremarkable. Other: No abdominal wall hernia or abnormality. No abdominopelvic ascites. Musculoskeletal: Curvature of the spine with degenerative changes. Degenerative changes as well along the pelvis. Osteopenia. There is also streak artifact as the patient's left arm was scanned while at the patient's side. IMPRESSION: Bilateral lower lobe consolidative lung opacities, right-greater-than-left. Acute infiltrate. Recommend follow-up. There is also an enlarged subcarinal lymph node which could be reactive and attention on follow up as well. Motion artifact. No bowel obstruction, free air or free fluid. Scattered stool. Normal appendix. Diffuse atherosclerotic changes. 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 Please correlate with any particular symptoms. Elevated right hemidiaphragm. Electronically Signed   By: Karen Kays M.D.   On: 05/17/2023 15:57   CT Head Wo Contrast  Result Date: 05/17/2023 CLINICAL DATA:  Delirium EXAM: CT HEAD WITHOUT CONTRAST TECHNIQUE: Contiguous axial images were obtained from the base of the skull through the vertex without intravenous contrast. RADIATION DOSE REDUCTION: This exam was performed according to the departmental dose-optimization program which includes automated exposure  control, adjustment of the mA and/or kV according to patient size and/or use of iterative reconstruction technique. COMPARISON:  CT Head 08/02/21 FINDINGS: Brain: No evidence of acute infarction, hemorrhage, hydrocephalus, extra-axial collection or mass lesion/mass effect. Chronic infarcts in the right frontoparietal region and right cerebellum. Vascular: No hyperdense vessel or unexpected calcification. Skull: Normal. Negative for fracture or focal lesion. Severe degenerative changes of the left TMJ. Sinuses/Orbits: No middle ear or mastoid effusion. Paranasal sinuses are clear. Orbits are unremarkable. Other: None. IMPRESSION: 1. No acute intracranial abnormality. 2. Chronic infarcts in the right frontoparietal region and right cerebellum. Electronically Signed   By: Lorenza Cambridge M.D.   On: 05/17/2023 13:41   DG Chest Portable 1 View  Result Date: 05/17/2023 CLINICAL DATA:  Altered mental status EXAM: PORTABLE CHEST 1 VIEW COMPARISON:  01/18/2022 FINDINGS: Underinflation. There is some linear opacity left lung base. Atelectasis is favored. No pneumothorax or effusion. Normal cardiopericardial silhouette. No edema. Osteopenia. Comminuted left clavicle fracture. Overlapping cardiac leads. IMPRESSION: Poor inflation.  Increasing left basilar atelectasis. Electronically Signed   By: Karen Kays M.D.   On: 05/17/2023 13:36    Pending Labs Unresulted Labs (From admission, onward)     Start     Ordered   05/24/23 0500  Creatinine, serum  (enoxaparin (LOVENOX)    CrCl >/= 30 ml/min)  Weekly,   R     Comments: while on enoxaparin therapy    05/17/23 1759            Vitals/Pain Today's Vitals   05/18/23 0050 05/18/23 0200 05/18/23 0426 05/18/23 0642  BP:   (!) 129/100   Pulse:   81   Resp:   15   Temp:   98.6 F (37 C)   TempSrc:      SpO2:   96%   Weight:      Height:      PainSc: Asleep Asleep  0-No pain    Isolation Precautions Airborne and Contact  precautions  Medications Medications  enoxaparin (  LOVENOX) injection 40 mg (has no administration in time range)  acetaminophen (TYLENOL) tablet 650 mg (has no administration in time range)    Or  acetaminophen (TYLENOL) suppository 650 mg (has no administration in time range)  polyethylene glycol (MIRALAX / GLYCOLAX) packet 17 g (has no administration in time range)  sodium chloride flush (NS) 0.9 % injection 3 mL (3 mLs Intravenous Not Given 05/17/23 2131)  cefTRIAXone (ROCEPHIN) 2 g in sodium chloride 0.9 % 100 mL IVPB (has no administration in time range)  azithromycin (ZITHROMAX) 500 mg in sodium chloride 0.9 % 250 mL IVPB (has no administration in time range)  insulin aspart (novoLOG) injection 0-9 Units (has no administration in time range)  insulin aspart (novoLOG) injection 0-5 Units ( Subcutaneous Not Given 05/17/23 2132)  ALPRAZolam (XANAX) tablet 0.25 mg (has no administration in time range)  divalproex (DEPAKOTE) DR tablet 250 mg (has no administration in time range)  rosuvastatin (CRESTOR) tablet 5 mg (0 mg Oral Hold 05/17/23 2040)  clopidogrel (PLAVIX) tablet 75 mg (0 mg Oral Hold 05/17/23 2040)  pantoprazole (PROTONIX) EC tablet 40 mg (0 mg Oral Hold 05/17/23 2040)  divalproex (DEPAKOTE) DR tablet 500 mg (has no administration in time range)  levothyroxine (SYNTHROID) tablet 88 mcg (0 mcg Oral Hold 05/18/23 0507)  lactated ringers bolus 1,000 mL (1,000 mLs Intravenous Bolus 05/17/23 1139)  iohexol (OMNIPAQUE) 300 MG/ML solution 100 mL (100 mLs Intravenous Contrast Given 05/17/23 1448)  cefTRIAXone (ROCEPHIN) 2 g in sodium chloride 0.9 % 100 mL IVPB (0 g Intravenous Stopped 05/17/23 1804)  azithromycin (ZITHROMAX) 500 mg in sodium chloride 0.9 % 250 mL IVPB (0 mg Intravenous Stopped 05/17/23 1844)  valproate (DEPACON) 500 mg in dextrose 5 % 50 mL IVPB (0 mg Intravenous Stopped 05/18/23 0232)    Mobility non-ambulatory     Focused Assessments Neuro Assessment Handoff:  Swallow  screen pass? No          Neuro Assessment: Exceptions to WDL   R Recommendations: See Admitting Provider Note  Report given to:   Additional Notes:

## 2023-05-18 NOTE — TOC CM/SW Note (Signed)
Transition of Care [facility]) - Inpatient Brief Assessment   Patient Details  Name: Susan Davidson MRN: [id number] Date of Birth: Jun 21, 1951  [facility]) CM/SW Contact:    Villa Herb, LCSWA Phone Number: 05/18/2023, 11:24 AM   Clinical Narrative: Transition of Care Department [facility]) has reviewed patient and no TOC needs have been identified at this time. We will continue to monitor patient advancement through interdisciplinary progression rounds. If new patient transition needs arise, please place a TOC consult.  Transition of Care Asessment: Insurance and Status: Insurance coverage has been reviewed Patient has primary care physician: Yes Home environment has been reviewed: from home Prior level of function:: needs assistance, spouse able to provide Prior/Current Home Services: No current home services Social Determinants of Health Reivew: SDOH reviewed no interventions necessary Readmission risk has been reviewed: Yes Transition of care needs: no transition of care needs at this time

## 2023-05-18 NOTE — Progress Notes (Signed)
Palliative: Thank you for this consult. Unfortunately due to high volume of consults there will be a delay in a Palliative Provider seeing this patient. Palliative Medicine will return to service on 05/19/23 and will see patient at that time.  No charge Lillia Carmel, NP Palliative Medicine Please call Palliative Medicine team phone with any questions [phone]. For individual providers please see AMION

## 2023-05-19 ENCOUNTER — Other Ambulatory Visit: Payer: Self-pay | Admitting: *Deleted

## 2023-05-19 ENCOUNTER — Encounter (HOSPITAL_COMMUNITY): Payer: Self-pay | Admitting: Internal Medicine

## 2023-05-19 DIAGNOSIS — Z7189 Other specified counseling: Secondary | ICD-10-CM | POA: Diagnosis not present

## 2023-05-19 DIAGNOSIS — Z515 Encounter for palliative care: Secondary | ICD-10-CM | POA: Diagnosis not present

## 2023-05-19 DIAGNOSIS — R7989 Other specified abnormal findings of blood chemistry: Secondary | ICD-10-CM | POA: Diagnosis not present

## 2023-05-19 DIAGNOSIS — G9341 Metabolic encephalopathy: Secondary | ICD-10-CM

## 2023-05-19 DIAGNOSIS — J189 Pneumonia, unspecified organism: Secondary | ICD-10-CM | POA: Diagnosis not present

## 2023-05-19 DIAGNOSIS — G9340 Encephalopathy, unspecified: Secondary | ICD-10-CM | POA: Diagnosis not present

## 2023-05-19 LAB — GLUCOSE, CAPILLARY
Glucose-Capillary: 71 mg/dL (ref 70–99)
Glucose-Capillary: 141 mg/dL — ABNORMAL HIGH (ref 70–99)
Glucose-Capillary: 131 mg/dL — ABNORMAL HIGH (ref 70–99)

## 2023-05-19 MED ORDER — ALBUTEROL SULFATE HFA 108 (90 BASE) MCG/ACT IN AERS: 2.0000 | INHALATION_SPRAY | Freq: Four times a day (QID) | RESPIRATORY_TRACT | 2 refills | Status: AC | PRN

## 2023-05-19 MED ORDER — GUAIFENESIN ER 600 MG PO TB12: 600.0000 mg | ORAL_TABLET | Freq: Two times a day (BID) | ORAL | 0 refills | Status: AC

## 2023-05-19 MED ORDER — AMOXICILLIN-POT CLAVULANATE 200-28.5 MG/5ML PO SUSR: 875.0000 mg | Freq: Two times a day (BID) | ORAL | 0 refills | Status: AC

## 2023-05-19 MED ORDER — SENNOSIDES-DOCUSATE SODIUM 8.6-50 MG PO TABS: 2.0000 | ORAL_TABLET | Freq: Every day | ORAL | 3 refills | Status: DC

## 2023-05-19 MED ORDER — LEVOTHYROXINE SODIUM 75 MCG PO TABS: 75.0000 ug | ORAL_TABLET | Freq: Every day | ORAL | 11 refills | Status: DC

## 2023-05-19 NOTE — Discharge Summary (Addendum)
BRIAWNA DABEL, is a 72 y.o. female  DOB April 25, 1951  MRN [id number].  Admission date:  05/17/2023  Admitting Physician  Nolberto Hanlon, MD  Discharge Date:  05/19/2023   Primary MD  Corwin Levins, MD  Recommendations for primary care physician for things to follow:   1)Speech therapist Recommendations:-- Diet recommendations: Dysphagia 3 (mechanical soft);Thin liquid Liquids provided via: Straw Medication Administration: Whole meds with liquid Supervision: Patient able to self feed - 2)Home health physical therapy and Occupational Therapy as ordered  Admission Diagnosis  Community acquired pneumonia [J18.9] Encephalopathy [G93.40] Hypoxia [R09.02]  Discharge Diagnosis  Community acquired pneumonia [J18.9] Encephalopathy [G93.40] Hypoxia [R09.02]    Principal Problem:   Community acquired pneumonia Active Problems:   Acute encephalopathy   Elevated serum free T4 level     Past Medical History:  Diagnosis Date   ANXIETY 02/12/2008   Qualifier: Diagnosis of  By: Jonny Ruiz MD, Len Blalock    CVA (cerebral vascular accident) [facility])    DEPRESSION 02/12/2008   Qualifier: Diagnosis of  By: Maris Berger    HYPERLIPIDEMIA 02/12/2008   Qualifier: Diagnosis of  By: Maris Berger    HYPERTENSION 02/12/2008   Qualifier: Diagnosis of  By: Maris Berger    HYPOTHYROIDISM 02/12/2008   Qualifier: Diagnosis of  By: Maris Berger    Impaired glucose tolerance 08/27/2011   Left hemiparesis (HCC)    OSTEOPENIA 02/12/2008   Qualifier: Diagnosis of  By: Jonny Ruiz MD, Len Blalock    VITAMIN D DEFICIENCY 04/14/2010   Qualifier: Diagnosis of  By: Jonny Ruiz MD, Len Blalock     Past Surgical History:  Procedure Laterality Date   IR RADIOLOGIST EVAL & MGMT  08/18/2021     HPI  from the history and physical done on the day of admission:   HPI: KERINA LAMOREUX is a 72 y.o. female with medical history  significant of old stroke, hypertension and known chronic cognitive disorder.  Patient is typically cared for by husband.  History is obtained from secondary sources due to patient's current presentation.   Approximately for 2 days patient has been noted to be not eating or drinking much, much more lethargic at home.  This prompted the patient to be brought to the ER by family   Patient is noted to be hypoxic on initial evaluation in the ER.  Further workup as noted below.  Medical evaluation is sought.   There is no report of patient having nausea vomiting or diarrhea having reported any pain or any rash or skin boils. Review of Systems: unable to review all systems due to the inability of the patient to answer questions.    Hospital Course:   Brief Narrative:   72 y.o. female with medical history significant of old stroke, HTN, dementia, hypothyroidism and DM2 admitted on 05/17/2023 with concerns for community-acquired pneumonia and acute metabolic encephalopathy     -Assessment and Plan: 1)Community Acquired Pneumonia----?? Aspiration related -Imaging studies noted with pneumonia -No fevers, no leukocytosis, -Treated with Rocephin and  azithromycin, bronchiodilators  -Much improved overall -Discharge home on p.o. Augmentin, albuterol inhaler as needed and Mucinex as needed   2)Acute Hypoxic Resp Failure--suspect related to #1 above -Resolved -O2 sats on room air 95 to 97%   3)Acute metabolic encephalopathy Ammonia is not elevated  -UDS with benzos which may be contributing to lethargy --Be judicious with benzos -According to patient's husband mentation is back to baseline   4)Hypothyroidism TSH is low normal at 0.8  -Free T4 is elevated at 1.95 -Reduce Levothyroxine to 75 mcg from 88 mcg   5) history of strokes-- MRI brain with remote strokes, no new acute strokes continue Plavix and Crestor for secondary prevention   6) dementia--- appears to be progressive with poor oral  intake lately -Continue supportive care and Depakote   7) dysphagia/FEN--patient with poor oral intake according to her husband, concerns for aspiration, speech pathologist recommends dysphagia 3 diet    8) generalized weakness--PTA patient will get an around-the-clock help at home -Husband request home health PT and OT   Disposition: The patient is from: Home              Anticipated d/c is to: Home with home med PT and OT  Discharge Condition: stable  Follow UP--   Follow-up Information     Call  Corwin Levins, MD.   Specialties: Internal Medicine, Radiology Why: Follow up from ER visit Contact information: [address] [place] [address] [phone]         Go to  [facility] Emergency Department at [facility].   Specialty: Emergency Medicine Why: As needed, If symptoms worsen Contact information: [address] [place] [address] [phone]                Diet and Activity recommendation:  As advised  Discharge Instructions    Discharge Instructions     Call MD for:  difficulty breathing, headache or visual disturbances   Complete by: As directed    Call MD for:  persistant dizziness or light-headedness   Complete by: As directed    Call MD for:  persistant nausea and vomiting   Complete by: As directed    Call MD for:  severe uncontrolled pain   Complete by: As directed    Call MD for:  temperature >100.4   Complete by: As directed    Diet general   Complete by: As directed    1)Speech therapist Recommendations:-- Diet recommendations: Dysphagia 3 (mechanical soft);Thin liquid Liquids provided via: Straw Medication Administration: Whole meds with liquid Supervision: Patient able to self feed   Discharge instructions   Complete by: As directed    1)Speech therapist Recommendations:-- Diet recommendations: Dysphagia 3 (mechanical soft);Thin liquid Liquids provided via: Straw Medication Administration:  Whole meds with liquid Supervision: Patient able to self feed  - 2)Home health physical therapy and Occupational Therapy as ordered   Increase activity slowly   Complete by: As directed          Discharge Medications     Allergies as of 05/19/2023       Reactions   Aleve [naproxen] Nausea Only   Fire Ant (solenopsis [place]) Anaphylaxis   Covid-19 Mrna Vaccine (pfizer) [covid-19 Mrna Vacc (moderna)]    Just the first round of moderna Covid vaccine, sluggish, uncoordinated, and weak, similar to flu.   Epinephrine    Influenza Vac Split Quad    Fatigue, mimics a "super bad flu"  Medication List     STOP taking these medications    polyethylene glycol 17 g packet Commonly known as: MIRALAX / GLYCOLAX   polyethylene glycol-electrolytes 420 g solution Commonly known as: NuLYTELY       TAKE these medications    acetaminophen 325 MG tablet Commonly known as: TYLENOL Take 2 tablets (650 mg total) by mouth every 6 (six) hours as needed for mild pain or headache (fever >/= 101).   albuterol 108 (90 Base) MCG/ACT inhaler Commonly known as: VENTOLIN HFA Inhale 2 puffs into the lungs every 6 (six) hours as needed for wheezing or shortness of breath.   alendronate 70 MG tablet Commonly known as: FOSAMAX Take 1 tablet (70 mg total) by mouth every 7 (seven) days. Take with a full glass of water on an empty stomach.   ALPRAZolam 0.5 MG tablet Commonly known as: XANAX Take 1 tablet (0.5 mg total) by mouth 2 (two) times daily as needed for anxiety.   amoxicillin-clavulanate 200-28.5 MG/5ML suspension Commonly known as: AUGMENTIN Take 21.9 mLs (876 mg total) by mouth 2 (two) times daily for 5 days.   ascorbic acid 500 MG tablet Commonly known as: VITAMIN C Take 1 tablet (500 mg total) by mouth daily.   clopidogrel 75 MG tablet Commonly known as: PLAVIX Take 1 tablet (75 mg total) by mouth daily.   divalproex 250 MG DR tablet Commonly known as: DEPAKOTE Take  1 tablet by mouth See admin instructions. 1 tablet in the morning and 2 tablets every evening   guaiFENesin 600 MG 12 hr tablet Commonly known as: Mucinex Take 1 tablet (600 mg total) by mouth 2 (two) times daily for 10 days.   levothyroxine 75 MCG tablet Commonly known as: Synthroid Take 1 tablet (75 mcg total) by mouth daily. What changed:  medication strength how much to take   melatonin 5 MG Tabs Take 5 mg by mouth.   memantine 5 MG tablet Commonly known as: NAMENDA Take 1 tablet (5 mg total) by mouth 2 (two) times daily.   multivitamin tablet Take 1 tablet by mouth daily.   OVER THE COUNTER MEDICATION Take 1 tablet by mouth daily. magnesium   pantoprazole 40 MG tablet Commonly known as: PROTONIX Take 1 tablet (40 mg total) by mouth daily.   PARoxetine 40 MG tablet Commonly known as: PAXIL Take 40 mg by mouth every morning.   rosuvastatin 5 MG tablet Commonly known as: CRESTOR TAKE 1 TABLET BY MOUTH DAILY. HOLD CRESTOR WHILE TAKING PAXLOVID   senna-docusate 8.6-50 MG tablet Commonly known as: Senokot-S Take 2 tablets by mouth at bedtime.        Major procedures and Radiology Reports - PLEASE review detailed and final reports for all details, in brief -   MR BRAIN WO CONTRAST  Result Date: 05/17/2023 CLINICAL DATA:  Neuro deficit, acute, stroke suspected. EXAM: MRI HEAD WITHOUT CONTRAST TECHNIQUE: Multiplanar, multiecho pulse sequences of the brain and surrounding structures were obtained without intravenous contrast. COMPARISON:  CT head without contrast 05/16/2013. MR head without contrast 01/18/2022. MR angiogram 03/06/2023. FINDINGS: Brain: No acute infarct, hemorrhage, or mass lesion is present. A remote right ACA territory infarct is noted. Periventricular and scattered subcortical T2 hyperintensities are otherwise mildly advanced for age. Remote lacunar infarcts are present within the basal ganglia bilaterally. Remote lacunar infarcts are present in the  thalami, left greater than right. Remote lacunar infarcts are present in the cerebellum bilaterally. The brainstem is within normal limits. The internal auditory canals are  within normal limits. Midline structures are within normal limits. Vascular: Flow is present in the major intracranial arteries. 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 Skull and upper cervical spine: The craniocervical junction is normal. Upper cervical spine is within normal limits. Marrow signal is unremarkable. Sinuses/Orbits: The left sphenoid sinus is opacified. The paranasal sinuses and mastoid air cells are otherwise clear. The globes and orbits are within normal limits. Other: IMPRESSION: 1. No acute intracranial abnormality. 2. Remote right ACA territory infarct. 3. Remote lacunar infarcts of the basal ganglia, thalami, and cerebellum bilaterally. 4. Periventricular and scattered subcortical T2 hyperintensities are otherwise mildly advanced for age. This likely reflects the sequela of chronic microvascular ischemia. 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 Electronically Signed   By: Marin Roberts M.D.   On: 05/17/2023 17:16   CT CHEST ABDOMEN PELVIS W CONTRAST  Result Date: 05/17/2023 CLINICAL DATA:  Altered mental status. Failure to thrive. Lethargy. Decreased oral intake at home. EXAM: CT CHEST, ABDOMEN, AND PELVIS WITH CONTRAST TECHNIQUE: Multidetector CT imaging of the chest, abdomen and pelvis was performed following the standard protocol during bolus administration of intravenous contrast. RADIATION DOSE REDUCTION: This exam was performed according to the departmental dose-optimization program which includes automated exposure control, adjustment of the mA and/or kV according to patient size and/or use of iterative reconstruction technique. CONTRAST:  OMNIPAQUE IOHEXOL 300 MG/ML  SOLN COMPARISON:  Chest CT angiogram November 2022. Chest x-ray 05/17/2023. FINDINGS: CT CHEST FINDINGS  Cardiovascular: Heart is nonenlarged. No pericardial effusion. The thoracic aorta has a normal course and caliber with vascular calcifications. Coronary artery calcifications are seen. Mediastinum/Nodes: Slightly patulous thoracic esophagus with some luminal air and fluid. Small thyroid gland. No specific abnormal lymph node enlargement identified in the axillary regions, hilum or mediastinum. Only exception is a subcarinal node on the right side measuring on series 3, image 24 at 2.2 by 1.0 cm. This has not seen on the prior examination Lungs/Pleura: Breathing motion identified. Consolidative opacity seen along the right lower lobe with some volume loss. There is some patchy opacity in the left lower lobe as well. Acute infiltrate is possible. There are some areas of bronchial wall thickening. There are some dependent ground-glass in the upper lobes. No pneumothorax or effusion. Musculoskeletal: Curvature of the spine. Scattered degenerative changes. Osteopenia. Elevation of the right hemidiaphragm. CT ABDOMEN PELVIS FINDINGS Hepatobiliary: Patent portal vein. No space-occupying enhancing liver lesion. Gallbladder is mildly distended Pancreas: Moderate global atrophy of the pancreas. There is a small cystic lesion identified along the uncinate process on series 3, image 66 measuring 9 mm in maximal dimension. Spleen: Splenic punctate calcified granuloma. The spleen is nonenlarged Adrenals/Urinary Tract: Adrenal glands are preserved. No enhancing renal mass. No collecting system dilatation. Parapelvic renal cysts are identified on the left. The ureters have normal course and caliber extending down to the bladder. Contracted urinary bladder. Stomach/Bowel: The large bowel has a normal course and caliber with scattered stool. Contracted stomach. Small bowel is nondilated. Normal caliber appendix extends medial to the cecum in the right lower quadrant. Vascular/Lymphatic: Diffuse vascular calcifications. This includes  the branch vessels. There are areas of potential high-grade stenosis along the renal arteries. Please correlate for any symptoms in the level of hypertension normal caliber IVC. No specific abnormal lymph node enlargement identified in the abdomen and pelvis. Reproductive: Uterus and bilateral adnexa are unremarkable. Other: No abdominal wall hernia or abnormality. No abdominopelvic ascites. Musculoskeletal: Curvature of the spine with degenerative changes.  Degenerative changes as well along the pelvis. Osteopenia. There is also streak artifact as the patient's left arm was scanned while at the patient's side. IMPRESSION: Bilateral lower lobe consolidative lung opacities, right-greater-than-left. Acute infiltrate. Recommend follow-up. There is also an enlarged subcarinal lymph node which could be reactive and attention on follow up as well. Motion artifact. No bowel obstruction, free air or free fluid. Scattered stool. Normal appendix. Diffuse atherosclerotic changes. 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 Please correlate with any particular symptoms. Elevated right hemidiaphragm. Electronically Signed   By: Karen Kays M.D.   On: 05/17/2023 15:57   CT Head Wo Contrast  Result Date: 05/17/2023 CLINICAL DATA:  Delirium EXAM: CT HEAD WITHOUT CONTRAST TECHNIQUE: Contiguous axial images were obtained from the base of the skull through the vertex without intravenous contrast. RADIATION DOSE REDUCTION: This exam was performed according to the departmental dose-optimization program which includes automated exposure control, adjustment of the mA and/or kV according to patient size and/or use of iterative reconstruction technique. COMPARISON:  CT Head 08/02/21 FINDINGS: Brain: No evidence of acute infarction, hemorrhage, hydrocephalus, extra-axial collection or mass lesion/mass effect. Chronic infarcts in the right frontoparietal region and right cerebellum. Vascular: No hyperdense vessel or  unexpected calcification. Skull: Normal. Negative for fracture or focal lesion. Severe degenerative changes of the left TMJ. Sinuses/Orbits: No middle ear or mastoid effusion. Paranasal sinuses are clear. Orbits are unremarkable. Other: None. IMPRESSION: 1. No acute intracranial abnormality. 2. Chronic infarcts in the right frontoparietal region and right cerebellum. Electronically Signed   By: Lorenza Cambridge M.D.   On: 05/17/2023 13:41   DG Chest Portable 1 View  Result Date: 05/17/2023 CLINICAL DATA:  Altered mental status EXAM: PORTABLE CHEST 1 VIEW COMPARISON:  01/18/2022 FINDINGS: Underinflation. There is some linear opacity left lung base. Atelectasis is favored. No pneumothorax or effusion. Normal cardiopericardial silhouette. No edema. Osteopenia. Comminuted left clavicle fracture. Overlapping cardiac leads. IMPRESSION: Poor inflation.  Increasing left basilar atelectasis. Electronically Signed   By: Karen Kays M.D.   On: 05/17/2023 13:36   DG BONE DENSITY (DXA)  Result Date: 05/11/2023 Table formatting from the original result was not included. Date of study: 05/10/2023 Exam: DUAL X-RAY ABSORPTIOMETRY (DXA) FOR BONE MINERAL DENSITY (BMD) Instrument: [employer] Requesting Provider: PCP Indication: screening for osteoporosis Comparison:  none (please note that it is not possible to compare data from different instruments) Clinical data: Pt is a 72 y.o. female with history of fracture. Results:  Lumbar spine L1-L4 (L3) Femoral neck (FN) 33% distal radius T-score   -0.5 RFN: -2.6 LFN: -3.1 -3.1 Assessment: Patient has OSTEOPOROSIS according to the [facility] classification for osteoporosis (see below). Fracture risk: high L3 vertebra had to be excluded from analysis due to DJD Comments: the technical quality of the study is good. In patients older than 75, the high prevalence of degenerative changes in the lumbar spine results in artificially higher bone density readings. WHO criteria for diagnosis of  osteoporosis in postmenopausal women and in men 70 y/o or older: - normal: T-score -1.0 to + 1.0 - osteopenia/low bone density: T-score between -2.5 and -1.0 - osteoporosis: T-score below -2.5 - severe osteoporosis: T-score below -2.5 with history of fragility fracture Note: although not part of the WHO classification, the presence of a fragility fracture, regardless of the T-score, should be considered diagnostic of osteoporosis, provided other causes for the fracture have been excluded. Treatment: The National Osteoporosis Foundation recommends that treatment be considered in postmenopausal women and  men age 4 or older with: 1. Hip or vertebral (clinical or morphometric) fracture 2. T-score of - 2.5 or lower at the spine or hip 3. 10-year fracture probability by FRAX of at least 20% for a major osteoporotic fracture and 3% for a hip fracture RECOMMENDATION: 1. All patients should optimize calcium and vitamin D intake. 2. Consider FDA-approved medical therapies in postmenopausal women and men aged 37 years and older, based on the following: a. A hip or vertebral(clinical or morphometric) fracture. b. T-Score of  -2.5 or less at the femoral neck , total hip or spine after appropriate evaluation to exclude secondary causes c. Low bone mass (T-score between -1.0 and -2.5 at the femoral neck or spine) and a 10 year probability of a hip fracture >3% or a 10 year probability of major osteoporosis-related fracture > 20% based on the [place]-adapted WHO algorithm d. Clinical judgement and/or patient preferences may indicate treatment for people with 10-year fracture probabilities above or below these levels Followup: Repeat BMD in 2 -3 years or as clinically indicated Interpreted by : Lyndle Herrlich, MD [facility]   DG Abd 2 Views  Result Date: 04/25/2023 CLINICAL DATA:  Assess stool burden. EXAM: ABDOMEN - 2 VIEW COMPARISON:  None Available. FINDINGS: Large amount of stool throughout the colon.  Nonobstructive bowel gas pattern. No free intraperitoneal air. Basilar atelectasis. Lumbar spine degenerative changes. IMPRESSION: Large amount of stool throughout the colon. Electronically Signed   By: Annia Belt M.D.   On: 04/25/2023 15:13    Micro Results  Recent Results (from the past 240 hour(s))  Resp panel by RT-PCR (RSV, Flu A&B, Covid) Urine, Clean Catch     Status: None   Collection Time: 05/17/23 11:39 AM   Specimen: Urine, Clean Catch; Nasal Swab  Result Value Ref Range Status   SARS Coronavirus 2 by RT PCR NEGATIVE NEGATIVE Final    Comment: (NOTE) SARS-CoV-2 target nucleic acids are NOT DETECTED.  The SARS-CoV-2 RNA is generally detectable in upper respiratory specimens during the acute phase of infection. The lowest concentration of SARS-CoV-2 viral copies this assay can detect is 138 copies/mL. A negative result does not preclude SARS-Cov-2 infection and should not be used as the sole basis for treatment or other patient management decisions. A negative result may occur with  improper specimen collection/handling, submission of specimen other than nasopharyngeal swab, presence of viral mutation(s) within the areas targeted by this assay, and inadequate number of viral copies(<138 copies/mL). A negative result must be combined with clinical observations, patient history, and epidemiological information. The expected result is Negative.  Fact Sheet for Patients:  BloggerCourse.com  Fact Sheet for Healthcare Providers:  SeriousBroker.it  This test is no t yet approved or cleared by the [place] FDA and  has been authorized for detection and/or diagnosis of SARS-CoV-2 by FDA under an Emergency Use Authorization (EUA). This EUA will remain  in effect (meaning this test can be used) for the duration of the COVID-19 declaration under Section 564(b)(1) of the Act, 21 U.S.C.section 360bbb-3(b)(1), unless the  authorization is terminated  or revoked sooner.       Influenza A by PCR NEGATIVE NEGATIVE Final   Influenza B by PCR NEGATIVE NEGATIVE Final    Comment: (NOTE) The Xpert Xpress SARS-CoV-2/FLU/RSV plus assay is intended as an aid in the diagnosis of influenza from Nasopharyngeal swab specimens and should not be used as a sole basis for treatment. Nasal washings and aspirates are unacceptable for Xpert Xpress SARS-CoV-2/FLU/RSV  testing.  Fact Sheet for Patients: BloggerCourse.com  Fact Sheet for Healthcare Providers: SeriousBroker.it  This test is not yet approved or cleared by the [place] FDA and has been authorized for detection and/or diagnosis of SARS-CoV-2 by FDA under an Emergency Use Authorization (EUA). This EUA will remain in effect (meaning this test can be used) for the duration of the COVID-19 declaration under Section 564(b)(1) of the Act, 21 U.S.C. section 360bbb-3(b)(1), unless the authorization is terminated or revoked.     Resp Syncytial Virus by PCR NEGATIVE NEGATIVE Final    Comment: (NOTE) Fact Sheet for Patients: BloggerCourse.com  Fact Sheet for Healthcare Providers: SeriousBroker.it  This test is not yet approved or cleared by the [place] FDA and has been authorized for detection and/or diagnosis of SARS-CoV-2 by FDA under an Emergency Use Authorization (EUA). This EUA will remain in effect (meaning this test can be used) for the duration of the COVID-19 declaration under Section 564(b)(1) of the Act, 21 U.S.C. section 360bbb-3(b)(1), unless the authorization is terminated or revoked.  Performed at [facility], [address], [place], [place] [address]     Today   Subjective    Elisha Alcantara today has no new complaints No fever  Or chills   No Nausea, Vomiting or Diarrhea Husband at bedside, mentation back to baseline, -Oral  intake is fair, but not great         Patient has been seen and examined prior to discharge   Objective   Blood pressure 130/69, pulse 72, temperature 98.3 F (36.8 C), resp. rate 18, height 5\' 2"  (1.575 m), weight 63.5 kg, SpO2 98%.   Intake/Output Summary (Last 24 hours) at 05/19/2023 1203 Last data filed at 05/19/2023 0800 Gross per 24 hour  Intake 360 ml  Output 200 ml  Net 160 ml    Exam Gen:- Awake Alert, no acute distress  HEENT:- [place].AT, No sclera icterus Neck-Supple Neck,No JVD,.  Lungs-  CTAB , good air movement bilaterally CV- S1, S2 normal, regular Abd-  +ve B.Sounds, Abd Soft, No tenderness,    Extremity/Skin:- No  edema,   good pulses Psych-cognitive and memory Deficits consistent with underlying dementia  neuro-no new focal deficits, no tremors    Data Review   CBC w Diff:  Lab Results  Component Value Date   WBC 7.6 05/18/2023   HGB 11.8 (L) 05/18/2023   HCT 36.9 05/18/2023   PLT 204 05/18/2023   LYMPHOPCT 16 05/17/2023   MONOPCT 11 05/17/2023   EOSPCT 0 05/17/2023   BASOPCT 1 05/17/2023   CMP:  Lab Results  Component Value Date   NA 140 05/18/2023   K 4.1 05/18/2023   CL 105 05/18/2023   CO2 26 05/18/2023   BUN 19 05/18/2023   CREATININE 0.88 05/18/2023   PROT 7.4 05/17/2023   ALBUMIN 3.3 (L) 05/17/2023   BILITOT 0.7 05/17/2023   ALKPHOS 59 05/17/2023   AST 16 05/17/2023   ALT 14 05/17/2023  .  Total Discharge time is about 33 minutes  Shon Hale M.D on 05/19/2023 at 12:03 PM  Go to [email or web address] -  for contact info  [facility] - Office  [phone]

## 2023-05-19 NOTE — Discharge Instructions (Signed)
1)Speech therapist Recommendations:-- Diet recommendations: Dysphagia 3 (mechanical soft);Thin liquid Liquids provided via: Straw Medication Administration: Whole meds with liquid Supervision: Patient able to self feed  - 2)Home health physical therapy and Occupational Therapy as ordered

## 2023-05-19 NOTE — Consult Note (Signed)
Consultation Note Date: 05/19/2023   Patient Name: Susan Davidson  DOB: August 03, 1951  MRN: [id number]  Age / Sex: 72 y.o., female  PCP: Corwin Levins, MD Referring Physician: Shon Hale, MD  Reason for Consultation: Establishing goals of care  HPI/Patient Profile: 72 y.o. female  with past medical history of CVA with left hemiparesis, memory loss, osteopenia, HTN/HLD, depression and anxiety admitted on 05/17/2023 with community-acquired pneumonia.   Clinical Assessment and Goals of Care: I have reviewed medical records including EPIC notes, labs and imaging, received report from RN, assessed the patient Susan Davidson is lying quietly in bed.  She appears acutely/chronically ill and frail.  She is resting comfortably, but wakes easily when I enter.  She greets me making and somewhat keeping eye contact.  She is alert and oriented to self and situation not month.  Her husband, Susan Davidson, is present at bedside.   We meet at the bedside to discuss diagnosis prognosis, GOC, EOL wishes, disposition and options.  I introduced Palliative Medicine as specialized medical care for people living with serious illness. It focuses on providing relief from the symptoms and stress of a serious illness. The goal is to improve quality of life for both the patient and the family.  We discussed a brief life review of the patient.  Susan Davidson have been married for 26 years.  She has no children.  She worked as a Arboriculturist.  She has aides services 5 days a week and every other Saturday.  Susan Davidson states that they also have respite care when needed.  He had been working on some home health services through [facility]  We then focused on their current illness.  We talk about community acquired pneumonia and the speech therapist consult and recommendations.  We talk about debility after stroke, Susan Davidson is in  essence bedbound.  We talked about the natural progression for memory loss.  The natural disease trajectory and expectations at EOL were discussed.  Advanced directives, concepts specific to code status, artifical feeding and hydration, and rehospitalization were considered and discussed.  We talked about the concept of treat the treatable but allowing natural passing.  At this point Susan Davidson states that she would want full scope/full code.  MOLST form reviewed and discussed in left with patient and husband.  Palliative Care services outpatient were explained and offered.  Patient and family readily agreeable for outpatient palliative services.  Provider choice offered.  They endorse [facility].  Transition of care team updated  Discussed the importance of continued conversation with family and the medical providers regarding overall plan of care and treatment options, ensuring decisions are within the context of the patient's values and GOCs.  Questions and concerns were addressed. The family was encouraged to call with questions or concerns.  PMT will continue to support holistically.  Conference with attending, bedside nursing staff, transition of care team related to patient condition, needs, goals of care, disposition.   HCPOA NEXT OF KIN -husband of 26 years,  Susan Davidson.  No children.    SUMMARY OF RECOMMENDATIONS   At this point continue full scope/full code Return home with daily Medicaid home health providers. Seeking additional home health workers through Outpatient palliative services with Susan Davidson code Status/Advance Care Planning: Full code -we talked about the concept of "treat the treatable, but allowing natural passing.  Although [name] states her desire to remain full code, I shared with her husband that since she has memory loss we will lean on him for decision making.  Symptom Management:  Per hospitalist, no additional needs at this time.  Palliative Prophylaxis:   Oral Care and Palliative Wound Care  Additional Recommendations (Limitations, Scope, Preferences): Full Scope Treatment  Psycho-social/Spiritual:  Desire for further Chaplaincy support:no Additional Recommendations: Caregiving  Support/Resources and Education on Hospice  Prognosis:  < 6 months, would not be surprising based on decreasing functional status, frailty, prior history of stroke with hemiplegia  Discharge Planning: Home with Palliative Services      Primary Diagnoses: Present on Admission:  Community acquired pneumonia   I have reviewed the medical record, interviewed the patient and family, and examined the patient. The following aspects are pertinent.  Past Medical History:  Diagnosis Date   ANXIETY 02/12/2008   Qualifier: Diagnosis of  By: Jonny Ruiz MD, Len Blalock    CVA (cerebral vascular accident) [facility])    DEPRESSION 02/12/2008   Qualifier: Diagnosis of  By: Maris Berger    HYPERLIPIDEMIA 02/12/2008   Qualifier: Diagnosis of  By: Maris Berger    HYPERTENSION 02/12/2008   Qualifier: Diagnosis of  By: Maris Berger    HYPOTHYROIDISM 02/12/2008   Qualifier: Diagnosis of  By: Maris Berger    Impaired glucose tolerance 08/27/2011   Left hemiparesis (HCC)    OSTEOPENIA 02/12/2008   Qualifier: Diagnosis of  By: Jonny Ruiz MD, Len Blalock    VITAMIN D DEFICIENCY 04/14/2010   Qualifier: Diagnosis of  By: Jonny Ruiz MD, Len Blalock    Social History   Socioeconomic History   Marital status: Married    Spouse name: Not on file   Number of children: 0   Years of education: Not on file   Highest education level: Not on file  Occupational History   Occupation: disabled  Tobacco Use   Smoking status: Former    Current packs/day: 0.00    Types: Cigarettes    Quit date: 01/26/2021    Years since quitting: 2.3   Smokeless tobacco: Never  Vaping Use   Vaping status: Never Used  Substance and Sexual Activity   Alcohol use: Not Currently    Drug use: No   Sexual activity: Not on file  Other Topics Concern   Not on file  Social History Narrative   Not on file   Social Determinants of Health   Financial Resource Strain: Low Risk  (04/27/2023)   Overall Financial Resource Strain (CARDIA)    Difficulty of Paying Living Expenses: Not hard at all  Food Insecurity: Patient Unable To Answer (05/17/2023)   Hunger Vital Sign    Worried About Running Out of Food in the Last Year: Patient unable to answer    Ran Out of Food in the Last Year: Patient unable to answer  Transportation Needs: Patient Unable To Answer (05/17/2023)   PRAPARE - Transportation    Lack of Transportation (Medical): Patient unable to answer    Lack of Transportation (Non-Medical): Patient unable to answer  Physical Activity: Inactive (04/27/2023)  Exercise Vital Sign    Days of Exercise per Week: 0 days    Minutes of Exercise per Session: 0 min  Stress: Stress Concern Present (04/27/2023)   [employer] of Occupational Health - Occupational Stress Questionnaire    Feeling of Stress : To some extent  Social Connections: Socially Isolated (04/27/2023)   Social Connection and Isolation Panel [NHANES]    Frequency of Communication with Friends and Family: Never    Frequency of Social Gatherings with Friends and Family: Never    Attends Religious Services: Never    Database administrator or Organizations: No    Attends Engineer, structural: Never    Marital Status: Married   Family History  Problem Relation Age of Onset   Heart disease Father    Bipolar disorder Sister    Diabetes Neg Hx    Scheduled Meds:  clopidogrel  75 mg Oral Daily   divalproex  250 mg Oral Daily   divalproex  500 mg Oral QHS   levothyroxine  75 mcg Oral Q0600   pantoprazole  40 mg Oral Daily   rosuvastatin  5 mg Oral Daily   sodium chloride flush  3 mL Intravenous Q12H   Continuous Infusions:  azithromycin 500 mg (05/18/23 1746)   cefTRIAXone (ROCEPHIN)  IV 2 g  (05/18/23 1659)   PRN Meds:.acetaminophen **OR** acetaminophen, albuterol, ALPRAZolam, polyethylene glycol Medications Prior to Admission:  Prior to Admission medications   Medication Sig Start Date End Date Taking? Authorizing Provider  acetaminophen (TYLENOL) 325 MG tablet Take 2 tablets (650 mg total) by mouth every 6 (six) hours as needed for mild pain or headache (fever >/= 101). 08/04/21  Yes Emokpae, Courage, MD  albuterol (VENTOLIN HFA) 108 (90 Base) MCG/ACT inhaler Inhale 2 puffs into the lungs every 6 (six) hours as needed for wheezing or shortness of breath. 05/19/23  Yes Emokpae, Courage, MD  alendronate (FOSAMAX) 70 MG tablet Take 1 tablet (70 mg total) by mouth every 7 (seven) days. Take with a full glass of water on an empty stomach. 05/12/23  Yes Corwin Levins, MD  ALPRAZolam Prudy Feeler) 0.5 MG tablet Take 1 tablet (0.5 mg total) by mouth 2 (two) times daily as needed for anxiety. 03/02/23  Yes Corwin Levins, MD  amoxicillin-clavulanate (AUGMENTIN) 200-28.5 MG/5ML suspension Take 21.9 mLs (876 mg total) by mouth 2 (two) times daily for 5 days. 05/19/23 05/24/23 Yes Emokpae, Courage, MD  ascorbic acid (VITAMIN C) 500 MG tablet Take 1 tablet (500 mg total) by mouth daily. 08/05/21  Yes Emokpae, Courage, MD  clopidogrel (PLAVIX) 75 MG tablet Take 1 tablet (75 mg total) by mouth daily. 06/13/22  Yes Corwin Levins, MD  divalproex (DEPAKOTE) 250 MG DR tablet Take 1 tablet by mouth See admin instructions. 1 tablet in the morning and 2 tablets every evening 07/28/21  Yes [provider]  guaiFENesin (MUCINEX) 600 MG 12 hr tablet Take 1 tablet (600 mg total) by mouth 2 (two) times daily for 10 days. 05/19/23 05/29/23 Yes Emokpae, Courage, MD  levothyroxine (SYNTHROID) 75 MCG tablet Take 1 tablet (75 mcg total) by mouth daily. 05/19/23 05/18/24 Yes Emokpae, Courage, MD  levothyroxine (SYNTHROID) 88 MCG tablet Take 1 tablet (88 mcg total) by mouth daily. 12/14/22  Yes Reardon, Alphonzo Lemmings J, NP  melatonin 5  MG TABS Take 5 mg by mouth.   Yes [provider]  memantine (NAMENDA) 5 MG tablet Take 1 tablet (5 mg total) by mouth 2 (two) times  daily. 06/13/22  Yes Corwin Levins, MD  Multiple Vitamin (MULTIVITAMIN) tablet Take 1 tablet by mouth daily.   Yes [provider]  OVER THE COUNTER MEDICATION Take 1 tablet by mouth daily. magnesium   Yes [provider]  pantoprazole (PROTONIX) 40 MG tablet Take 1 tablet (40 mg total) by mouth daily. 04/10/23  Yes Corwin Levins, MD  PARoxetine (PAXIL) 40 MG tablet Take 40 mg by mouth every morning. 01/01/22  Yes [provider]  polyethylene glycol-electrolytes (NULYTELY) 420 g solution Take 4,000 mLs by mouth once. 04/25/23  Yes [provider]  rosuvastatin (CRESTOR) 5 MG tablet TAKE 1 TABLET BY MOUTH DAILY. HOLD CRESTOR WHILE TAKING PAXLOVID 12/26/22  Yes Corwin Levins, MD  senna-docusate (SENOKOT-S) 8.6-50 MG tablet Take 2 tablets by mouth at bedtime. 05/19/23 05/18/24 Yes Emokpae, Courage, MD  polyethylene glycol (MIRALAX / GLYCOLAX) 17 g packet Take 17 g by mouth daily as needed for mild constipation. Patient not taking: Reported on 05/17/2023 08/04/21   Shon Hale, MD   Allergies  Allergen Reactions   Aleve [Naproxen] Nausea Only   Fire Ant (Solenopsis [place]) Anaphylaxis   Covid-19 Mrna Vaccine (Pfizer) [phone] [employer] (Moderna)]     Just the first round of moderna Covid vaccine, sluggish, uncoordinated, and weak, similar to flu.   Epinephrine    Influenza Vac Split Quad     Fatigue, mimics a "super bad flu"   Review of Systems  Unable to perform ROS: Dementia    Physical Exam Vitals and nursing note reviewed.  HENT:     Mouth/Throat:     Mouth: Mucous membranes are dry.  Cardiovascular:     Rate and Rhythm: Normal rate.  Pulmonary:     Effort: Pulmonary effort is normal. No respiratory distress.  Skin:    General: Skin is warm and dry.  Neurological:     Mental Status: She is alert.      Comments: Oriented to self and place, not month  Psychiatric:        Mood and Affect: Mood normal.        Behavior: Behavior normal.     Comments: Calm and cooperative, not fearful     Vital Signs: BP (!) 137/59 (BP Location: Left Arm)   Pulse 81   Temp 98.6 F (37 C) (Oral)   Resp 16   Ht 5\' 2"  (1.575 m)   Wt 63.5 kg   SpO2 97%   BMI 25.60 kg/m  Pain Scale: 0-10   Pain Score: Asleep   SpO2: SpO2: 97 % O2 Device:SpO2: 97 % O2 Flow Rate: .O2 Flow Rate (L/min): 2 L/min  IO: Intake/output summary:  Intake/Output Summary (Last 24 hours) at 05/19/2023 1322 Last data filed at 05/19/2023 0800 Gross per 24 hour  Intake 360 ml  Output 200 ml  Net 160 ml    LBM: Last BM Date : 05/16/23 Baseline Weight: Weight: 63.5 kg Most recent weight: Weight: 63.5 kg     Palliative Assessment/Data:     Time In: 0830 Time Out: 0945 Time Total: 75 minutes  Greater than 50%  of this time was spent counseling and coordinating care related to the above assessment and plan.  Signed by: Katheran Awe, NP   Please contact Palliative Medicine Team phone at [phone] for questions and concerns.  For individual provider: See Loretha Stapler

## 2023-05-19 NOTE — Progress Notes (Signed)
Speech Language Pathology Treatment: Dysphagia  Patient Details Name: Susan Davidson MRN: [id number] DOB: April 22, 1951 Today's Date: 05/19/2023 Time: 1324-4010 SLP Time Calculation (min) (ACUTE ONLY): 25 min  Assessment / Plan / Recommendation Clinical Impression  Ongoing diagnostic dysphagia therapy provided this morning; Pt pleasantly declined all solid trials but consumed an ensure via straw; note slow oral coordination and slight oral holding but upon palpation swallow with good laryngeal excursion and no overt signs of airway compromise. Recommend continue with current diet; communicated with RN and Pt reviewed standard aspiration precautions and reflux precautions. Our service will sign off at this time, thank you for this referral,   HPI HPI: Susan Davidson is a 72 y.o. female with medical history significant of old stroke, hypertension and known chronic cognitive disorder.  Patient is typically cared for by husband.  History is obtained from secondary sources due to patient's current presentation.     Approximately for 2 days patient has been noted to be not eating or drinking much, much more lethargic at home.  This prompted the patient to be brought to the ER by family     Patient is noted to be hypoxic on initial evaluation in the ER.  Further workup as noted below.  Medical evaluation is sought. Bilateral lower lobe consolidative lung opacities,  right-greater-than-left. Acute infiltrate.  BSE requested.      SLP Plan  All goals met      Recommendations for follow up therapy are one component of a multi-disciplinary discharge planning process, led by the attending physician.  Recommendations may be updated based on patient status, additional functional criteria and insurance authorization.    Recommendations  Diet recommendations: Dysphagia 3 (mechanical soft);Thin liquid Liquids provided via: Straw Medication Administration: Whole meds with liquid Supervision: Patient able to  self feed Compensations: Slow rate                  Oral care BID;Staff/trained caregiver to provide oral care     Dysphagia, unspecified (R13.10)     All goals met    Susan Davidson. Romie Levee, CCC-SLP Speech Language Pathologist  Susan Davidson  05/19/2023, 8:50 AM

## 2023-05-19 NOTE — TOC Transition Note (Addendum)
Transition of Care [facility]) - CM/SW Discharge Note   Patient Details  Name: Susan Davidson MRN: [id number] Date of Birth: July 08, 1951  [facility] CM/SW Contact:  Villa Herb, LCSWA Phone Number: 05/19/2023, 1:02 PM   Clinical Narrative:    CSW met with pts spouse at bedside, he states he has a call in with [facility] for home services. Pt has an aide 5 days a week. CSW spoke to [place] with [facility] who accepts pts referral for [facility] PT/OT. MD placed orders. Cory updated on D/C today. CSW updated by Palliative NP that pt and spouse would like outpatient palliative services with Gertie Exon, CSW to make referral. TOC signing off.   Final next level of care: Home w Home Health Services Barriers to Discharge: Barriers Resolved   Patient Goals and CMS Choice CMS Medicare.gov Compare Post Acute Care list provided to:: Patient Represenative (must comment) Choice offered to / list presented to : Spouse  Discharge Placement                         Discharge Plan and Services Additional resources added to the After Visit Summary for                            [facility] Arranged: PT, OT [facility] Agency: [facility] Date [facility] Agency Contacted: 05/19/23   Representative spoke with at [facility] Agency: Kandee Keen  Social Determinants of Health (SDOH) Interventions SDOH Screenings   Food Insecurity: Patient Unable To Answer (05/17/2023)  Housing: Patient Unable To Answer (05/17/2023)  Transportation Needs: Patient Unable To Answer (05/17/2023)  Utilities: Not At Risk (04/27/2023)  Alcohol Screen: Low Risk  (04/27/2023)  Depression (PHQ2-9): High Risk (04/27/2023)  Financial Resource Strain: Low Risk  (04/27/2023)  Physical Activity: Inactive (04/27/2023)  Social Connections: Socially Isolated (04/27/2023)  Stress: Stress Concern Present (04/27/2023)  Tobacco Use: Medium Risk (05/17/2023)  Health Literacy: Adequate Health Literacy (04/27/2023)     Readmission Risk Interventions    01/29/2021    12:39 PM  Readmission Risk Prevention Plan  Medication Screening Complete  Transportation Screening Complete

## 2023-05-19 NOTE — Progress Notes (Signed)
Patient slept most of the night during this shift. One prn medication given.  Swallowed pills whole with not problems with thin liquids.  Plan of care ongoing.

## 2023-05-19 NOTE — Consult Note (Signed)
[facility] Customer service manager [facility]) Accountable Care Organization (ACO) [facility] Liaison Note  05/19/2023  Susan Davidson 12/22/1950 [id number]  Location: [facility] RN Hospital Liaison screened the patient remotely at [facility].  Insurance: [facility] HMO   Susan LILIENTHAL is a 73 y.o. female who is a Primary Care Patient of Corwin Levins, MD. The patient was screened for readmission hospitalization with noted low risk score for unplanned readmission risk with 1 IP in 6 months.  The patient was assessed for potential [facility]) Care Management service needs for post hospital transition for care coordination. Review of patient's electronic medical record reveals patient was admitted with encephalopathy secondary to pneumonia. Liaison attempted several outreach calls however unsuccessful and unable to leave a HIPAA voice message to request a call back. Based upon the risk with the admitting diagnosis liaison will make a referral for a nurse care coordinator to follow up accordingly.  Plan: [facility] Liaison will continue to follow progress and disposition to asess for post hospital community care coordination/management needs.  Referral request for community care coordination: Liaison will make a referral for care coordination services for a nurse to completed a post hospital prevention readmission follow up call.   [name] Care Management/Population Health does not replace or interfere with any arrangements made by the Inpatient Transition of Care team.   For questions contact:   Elliot Cousin, RN, [facility] Liaison [name]   Population Health Office Hours MTWF  8:00 am-6:00 pm Off on Thursday [phone] mobile [phone] [Office toll free line] Office Hours are M-F 8:30 - 5 pm [email or web address]

## 2023-05-19 NOTE — Care Management Important Message (Signed)
Important Message  Patient Details  Name: MALAUN MACEDA MRN: [id number] Date of Birth: March 22, 1951   Medicare Important Message Given:  Yes     Corey Harold 05/19/2023, 11:28 AM

## 2023-05-22 ENCOUNTER — Telehealth: Payer: Self-pay | Admitting: *Deleted

## 2023-05-22 ENCOUNTER — Encounter: Payer: Self-pay | Admitting: *Deleted

## 2023-05-22 DIAGNOSIS — I10 Essential (primary) hypertension: Secondary | ICD-10-CM | POA: Diagnosis not present

## 2023-05-22 DIAGNOSIS — J189 Pneumonia, unspecified organism: Secondary | ICD-10-CM | POA: Diagnosis not present

## 2023-05-22 DIAGNOSIS — F0393 Unspecified dementia, unspecified severity, with mood disturbance: Secondary | ICD-10-CM | POA: Diagnosis not present

## 2023-05-22 DIAGNOSIS — I671 Cerebral aneurysm, nonruptured: Secondary | ICD-10-CM | POA: Diagnosis not present

## 2023-05-22 DIAGNOSIS — F32A Depression, unspecified: Secondary | ICD-10-CM | POA: Diagnosis not present

## 2023-05-22 DIAGNOSIS — I69354 Hemiplegia and hemiparesis following cerebral infarction affecting left non-dominant side: Secondary | ICD-10-CM | POA: Diagnosis not present

## 2023-05-22 DIAGNOSIS — F0394 Unspecified dementia, unspecified severity, with anxiety: Secondary | ICD-10-CM | POA: Diagnosis not present

## 2023-05-22 DIAGNOSIS — G9341 Metabolic encephalopathy: Secondary | ICD-10-CM | POA: Diagnosis not present

## 2023-05-22 DIAGNOSIS — E119 Type 2 diabetes mellitus without complications: Secondary | ICD-10-CM | POA: Diagnosis not present

## 2023-05-22 NOTE — Telephone Encounter (Signed)
Patient was called , and a message was left with Whitney's recommendation.

## 2023-05-22 NOTE — Telephone Encounter (Signed)
Patient's husband left a message that the patient had to go to the ED last week. It turns out that she had a lung infection. When leaving the hospital they were given a prescription for Levothyroxine 75 mcg, he states that Whitney had her on 88 mcg, He just wanted Whitney to be aware.

## 2023-05-22 NOTE — Transitions of Care (Post Inpatient/ED Visit) (Signed)
05/22/2023  Name: Susan Davidson MRN: [id number] DOB: [birth year]  Today's TOC FU Call Status: Today's TOC FU Call Status:: Successful TOC FU Call Completed TOC FU Call Complete Date: 05/22/23  Transition Care Management Follow-up Telephone Call Date of Discharge: 05/22/23 [facility] Type of Discharge: Inpatient Admission Primary Inpatient Discharge Diagnosis:: CAP- metabolic encephalopathy How have you been since you were released from the hospital?: Same (per spouse: "she is about back to her baseline; the aide is there with her all day and I take care of everything at night when I get off work.  She is pretty much total care.  I plan to call [facility] after we talk to ask about the antibiotic") Any questions or concerns?: Yes Patient Questions/Concerns:: Per spouse: "The outpatient pharmacy said they would try to find the antibiotic, said it should be in by today: so far, we do not have it because the outpatient pharmacy didn't have it" Patient Questions/Concerns Addressed: Other: (provided education around outpatient pharmacy responsilbitly to facilitate getting antibiotic promptly; provided education around need to start antibiotic promptly; provided my direct phone number should needs around obtaining medication persist)  Items Reviewed: Did you receive and understand the discharge instructions provided?: Yes (thoroughly reviewed with patient's spouse who verbalizes good understanding of same) Medications obtained,verified, and reconciled?: Yes (Medications Reviewed) (Full medication reconciliation/ review completed; no concerns or discrepancies identified; confirmed patient obtained/ is taking all newly Rx'd medications as instructed; spouse-manages medications and denies questions/ concerns around medications today) Any new allergies since your discharge?: No Dietary orders reviewed?: Yes Type of Diet Ordered:: mechanical soft; thin liquids; aspiration  considerations Do you have support at home?: Yes People in Home: spouse Name of Support/Comfort Primary Source: Reports requires assistance/ total care in self-care activities; supportive spouse assists as/ if needed/ indicated; 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 Sig Documenting Provider Last Dose Status Informant  acetaminophen (TYLENOL) 325 MG tablet [id number] Yes Take 2 tablets (650 mg total) by mouth every 6 (six) hours as needed for mild pain or headache (fever >/= 101). Shon Hale, MD Taking Active Spouse/Significant Other  albuterol (VENTOLIN HFA) 108 (90 Base) MCG/ACT inhaler [id number] Yes Inhale 2 puffs into the lungs every 6 (six) hours as needed for wheezing or shortness of breath. Shon Hale, MD Taking Active   alendronate (FOSAMAX) 70 MG tablet [id number] Yes Take 1 tablet (70 mg total) by mouth every 7 (seven) days. Take with a full glass of water on an empty stomach. Corwin Levins, MD Taking Active Spouse/Significant Other  ALPRAZolam Prudy Feeler) 0.5 MG tablet [id number] Yes Take 1 tablet (0.5 mg total) by mouth 2 (two) times daily as needed for anxiety. Corwin Levins, MD Taking Active Spouse/Significant Other  amoxicillin-clavulanate (AUGMENTIN) 200-28.5 MG/5ML suspension [id number] No Take 21.9 mLs (876 mg total) by mouth 2 (two) times daily for 5 days.  Patient not taking: Reported on 05/22/2023   Shon Hale, MD Unknown Active            Med Note Marilu Favre May 22, 2023 11:18 AM) 05/22/23: Reports during [facility] call, outpatient pharmacy did not have this medication to process Rx over weekend: spouse reports he was told [facility] would "have today for  pick up"  advised to start taking as directed as soon as available for  pick up  ascorbic acid (VITAMIN C) 500 MG tablet [id number] Yes Take 1 tablet (500 mg total) by mouth daily. Shon Hale, MD Taking Active Spouse/Significant Other  clopidogrel (PLAVIX) 75 MG tablet [id number] Yes Take 1 tablet (75 mg total) by mouth daily. Corwin Levins, MD Taking Active Spouse/Significant Other  divalproex (DEPAKOTE) 250 MG DR tablet [id number] Yes Take 1 tablet by mouth See admin instructions. 1 tablet in the morning and 2 tablets every evening [provider] Taking Active Spouse/Significant Other           Med Note Elesa Massed, Illene Labrador   Wed May 17, 2023  6:14 PM) Pt wasn't able to have today due to delays at the pharmacy.  guaiFENesin (MUCINEX) 600 MG 12 hr tablet [id number] Yes Take 1 tablet (600 mg total) by mouth 2 (two) times daily for 10 days. Shon Hale, MD Taking Active   levothyroxine (SYNTHROID) 75 MCG tablet [id number] Yes Take 1 tablet (75 mcg total) by mouth daily. Shon Hale, MD Taking Active   melatonin 5 MG TABS [id number] Yes Take 5 mg by mouth. [provider] Taking Active Spouse/Significant Other  memantine (NAMENDA) 5 MG tablet [id number] Yes Take 1 tablet (5 mg total) by mouth 2 (two) times daily. Corwin Levins, MD Taking Active Spouse/Significant Other  Multiple Vitamin (MULTIVITAMIN) tablet [id number] Yes Take 1 tablet by mouth daily. [provider] Taking Active Spouse/Significant Other  OVER THE COUNTER MEDICATION [id number] Yes Take 1 tablet by mouth daily. magnesium [provider] Taking Active Spouse/Significant Other  pantoprazole (PROTONIX) 40 MG tablet [id number] Yes Take 1 tablet (40 mg total) by mouth daily. Corwin Levins, MD Taking Active Spouse/Significant Other  PARoxetine (PAXIL) 40 MG tablet [id number] Yes Take 40 mg by mouth every morning. [provider] Taking Active Spouse/Significant Other  rosuvastatin (CRESTOR) 5 MG tablet [id number] Yes TAKE 1 TABLET BY MOUTH DAILY. HOLD CRESTOR WHILE  TAKING PAXLOVID Corwin Levins, MD Taking Active Spouse/Significant Other  senna-docusate (SENOKOT-S) 8.6-50 MG tablet [id number] Yes Take 2 tablets by mouth at bedtime. 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: requires assistance with all care needs Has Agency set up a time to come to your home?: Yes First Home Health Visit Date: 05/22/23 Any new equipment or medical supplies ordered?: No  Functional Questionnaire: Do you need assistance with bathing/showering or dressing?: Yes (husband/ private duty caregivers provide for all care needs) Do you need assistance with meal preparation?: Yes (husband/ private duty caregivers provide for all care needs) Do you need assistance with eating?: Yes (husband/ private duty caregivers provide for all care needs) Do you have difficulty maintaining continence: Yes (husband/ private duty caregivers provide for all care needs) Do you need assistance with getting out of bed/getting out of a chair/moving?: Yes (husband/ private duty caregivers provide for all care needs) Do you have difficulty managing or taking your medications?: Yes (husband/ private duty caregivers provide for all care needs)  Follow up appointments reviewed: PCP Follow-up appointment confirmed?: Yes (care coordination outreach in real-time with scheduling care guide to successfully schedule hospital follow up PCP appointment 05/29/23) Date of PCP follow-up appointment?: 05/29/23 Follow-up Provider: PCP Specialist Hospital Follow-up  appointment confirmed?: Yes Date of Specialist follow-up appointment?: 05/23/23 Follow-Up Specialty Provider:: psychiatry provider Do you need transportation to your follow-up appointment?: No Do you understand care options if your condition(s) worsen?: Yes-patient verbalized  understanding  SDOH Interventions Today    Flowsheet Row Most Recent Value  SDOH Interventions   Food Insecurity Interventions Intervention Not Indicated  Transportation Interventions Intervention Not Indicated  [spouse provides all transportation]      TOC Interventions Today    Flowsheet Row Most Recent Value  TOC Interventions   TOC Interventions Discussed/Reviewed TOC Interventions Discussed, Arranged PCP follow up less than 12 days/Care Guide scheduled  [declines need for ongoing/ further care coordination outreach,  reports well supported by current private duty/ respite agencies,  provided my direct contact information should questions/ concerns/ needs arise post-TOC call]      Interventions Today    Flowsheet Row Most Recent Value  Chronic Disease   Chronic disease during today's visit Other  [metabolic encephalopathy secondary to CAP]  General Interventions   General Interventions Discussed/Reviewed General Interventions Discussed, Doctor Visits, Durable Medical Equipment (DME)  Doctor Visits Discussed/Reviewed Doctor Visits Discussed, PCP, Specialist  Durable Medical Equipment (DME) Bed side commode, Wheelchair  [confirmed currently requiring/ using assistive devices wheelchair]  Wheelchair Standard  PCP/Specialist Visits Compliance with follow-up visit  Exercise Interventions   Exercise Discussed/Reviewed Exercise Discussed  [facility] PT/ OT]  Education Interventions   Education Provided Provided Education  Provided Verbal Education On Medication  [importance of taking antibiotic as prescribed/ obtaining antibiotic promptly]  Nutrition Interventions   Nutrition Discussed/Reviewed Nutrition Discussed  Pharmacy Interventions   Pharmacy Dicussed/Reviewed Pharmacy Topics Discussed  [Full medication review with updating medication list in EHR per patient report]  Safety Interventions   Safety Discussed/Reviewed Safety Discussed, Fall Risk      Caryl Pina, RN, BSN, CCRN Alumnus RN CM Care Coordination/ Transition of Care- [facility] Care Management [phone]: direct office

## 2023-05-22 NOTE — Telephone Encounter (Signed)
Thank you for the update.  Looks like her thyroid levels were a bit too high during her recent ED trip, thus it was decreased.   We can recheck prior to next visit and see how that adjustment has affected her levels.

## 2023-05-24 ENCOUNTER — Telehealth: Payer: Self-pay | Admitting: *Deleted

## 2023-05-24 DIAGNOSIS — I69354 Hemiplegia and hemiparesis following cerebral infarction affecting left non-dominant side: Secondary | ICD-10-CM | POA: Diagnosis not present

## 2023-05-24 DIAGNOSIS — F0394 Unspecified dementia, unspecified severity, with anxiety: Secondary | ICD-10-CM | POA: Diagnosis not present

## 2023-05-24 DIAGNOSIS — I671 Cerebral aneurysm, nonruptured: Secondary | ICD-10-CM | POA: Diagnosis not present

## 2023-05-24 DIAGNOSIS — F32A Depression, unspecified: Secondary | ICD-10-CM | POA: Diagnosis not present

## 2023-05-24 DIAGNOSIS — E119 Type 2 diabetes mellitus without complications: Secondary | ICD-10-CM | POA: Diagnosis not present

## 2023-05-24 DIAGNOSIS — J189 Pneumonia, unspecified organism: Secondary | ICD-10-CM | POA: Diagnosis not present

## 2023-05-24 DIAGNOSIS — F0393 Unspecified dementia, unspecified severity, with mood disturbance: Secondary | ICD-10-CM | POA: Diagnosis not present

## 2023-05-24 DIAGNOSIS — G9341 Metabolic encephalopathy: Secondary | ICD-10-CM | POA: Diagnosis not present

## 2023-05-24 DIAGNOSIS — I10 Essential (primary) hypertension: Secondary | ICD-10-CM | POA: Diagnosis not present

## 2023-05-24 NOTE — Progress Notes (Signed)
  Care Coordination   Note   05/24/2023 Name: Susan Davidson MRN: [id number] DOB: 14-Nov-1950  Susan Davidson is a 72 y.o. year old female who sees Corwin Levins, MD for primary care. I reached out to [name] by phone today to offer care coordination services.  Ms. Schons was given information about Care Coordination services today including:   The Care Coordination services include support from the care team which includes your Nurse Coordinator, Clinical Social Worker, or Pharmacist.  The Care Coordination team is here to help remove barriers to the health concerns and goals most important to you. Care Coordination services are voluntary, and the patient may decline or stop services at any time by request to their care team member.   Care Coordination Consent Status: Patient agreed to services and verbal consent obtained.   Follow up plan:  Telephone appointment with care coordination team member scheduled for:  06/08/2023  Encounter Outcome:  Pt. Scheduled from referral   Burman Nieves, [facility] Care Coordination Care Guide Direct Dial: [phone]

## 2023-05-29 ENCOUNTER — Ambulatory Visit: Payer: Medicare HMO | Admitting: Internal Medicine

## 2023-05-29 ENCOUNTER — Encounter: Payer: Self-pay | Admitting: Internal Medicine

## 2023-05-29 VITALS — BP 130/78 | HR 69 | Temp 98.5°F | Ht 62.0 in | Wt 140.0 lb

## 2023-05-29 DIAGNOSIS — Z8701 Personal history of pneumonia (recurrent): Secondary | ICD-10-CM

## 2023-05-29 DIAGNOSIS — Z23 Encounter for immunization: Secondary | ICD-10-CM

## 2023-05-29 DIAGNOSIS — R1310 Dysphagia, unspecified: Secondary | ICD-10-CM | POA: Insufficient documentation

## 2023-05-29 DIAGNOSIS — N1832 Chronic kidney disease, stage 3b: Secondary | ICD-10-CM | POA: Diagnosis not present

## 2023-05-29 DIAGNOSIS — R531 Weakness: Secondary | ICD-10-CM | POA: Diagnosis not present

## 2023-05-29 DIAGNOSIS — J189 Pneumonia, unspecified organism: Secondary | ICD-10-CM

## 2023-05-29 NOTE — Progress Notes (Signed)
Patient ID: Susan Davidson, female   DOB: March 27, 1951, 72 y.o.   MRN: [id number]        Chief Complaint: follow up hospn 8/14-16       HPI:  Susan Davidson is a 72 y.o. female here to f/u post hospn with aspirate pna with acute resp failure, and dysphagia which persists, can only take 1 pill at a time, takes so much time , ends up missing some meds.  Appetite not really improved yet.  ST did not come to the home last wk, with [facility]. To start PT as well.    Finished antibiotic post d/c. Takes mucines for lung congestion, and inhaler seems to be helping somewhat. Nutrition not as good as prior to hospn so far despite the boost which she has off and on use. Has some residual cough somewhat at night, but not worsening, no fever or worsening sob.  Pt denies chest pain, increased sob or doe, wheezing, orthopnea, PND, increased LE swelling, palpitations, dizziness or syncope.  Has ongoing neck and upper back soreness and asks son for massage that helps.  To also start OT soon.  Confusion much improved from hospn, but did have an unusual episode for a minute last evening where she did not recognize the sone.  Noted levothyroxin from 88 to 75 mcg per day, has f/u appt with endo in about 2 wks.   Pt denies polydipsia, polyuria, or new focal neuro s/s.   Due for prevnar 20  Wt Readings from Last 3 Encounters:  05/29/23 140 lb (63.5 kg)  05/17/23 139 lb 15.9 oz (63.5 kg)  04/27/23 140 lb (63.5 kg)   BP Readings from Last 3 Encounters:  05/29/23 130/78  05/19/23 (!) 137/59  04/20/23 (!) 176/90         Past Medical History:  Diagnosis Date   ANXIETY 02/12/2008   Qualifier: Diagnosis of  By: Jonny Ruiz MD, Len Blalock    CVA (cerebral vascular accident) [facility])    DEPRESSION 02/12/2008   Qualifier: Diagnosis of  By: Maris Berger    HYPERLIPIDEMIA 02/12/2008   Qualifier: Diagnosis of  By: Maris Berger    HYPERTENSION 02/12/2008   Qualifier: Diagnosis of  By: Maris Berger     HYPOTHYROIDISM 02/12/2008   Qualifier: Diagnosis of  By: Maris Berger    Impaired glucose tolerance 08/27/2011   Left hemiparesis (HCC)    OSTEOPENIA 02/12/2008   Qualifier: Diagnosis of  By: Jonny Ruiz MD, Len Blalock    VITAMIN D DEFICIENCY 04/14/2010   Qualifier: Diagnosis of  By: Jonny Ruiz MD, Len Blalock    Past Surgical History:  Procedure Laterality Date   IR RADIOLOGIST EVAL & MGMT  08/18/2021    reports that she quit smoking about 2 years ago. Her smoking use included cigarettes. She has never used smokeless tobacco. She reports that she does not currently use alcohol. She reports that she does not use drugs. family history includes Bipolar disorder in her sister; Heart disease in her father. 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   Epinephrine    Influenza Vac Split Quad     Fatigue, mimics a "super bad flu"   Current Outpatient Medications on File Prior to Visit  Medication Sig Dispense Refill  acetaminophen (TYLENOL) 325 MG tablet Take 2 tablets (650 mg total) by mouth every 6 (six) hours as needed for mild pain or headache (fever >/= 101). 12 tablet 2   albuterol (VENTOLIN HFA) 108 (90 Base) MCG/ACT inhaler Inhale 2 puffs into the lungs every 6 (six) hours as needed for wheezing or shortness of breath. 17 each 2   alendronate (FOSAMAX) 70 MG tablet Take 1 tablet (70 mg total) by mouth every 7 (seven) days. Take with a full glass of water on an empty stomach. 12 tablet 3   ALPRAZolam (XANAX) 0.5 MG tablet Take 1 tablet (0.5 mg total) by mouth 2 (two) times daily as needed for anxiety. 60 tablet 2   ascorbic acid (VITAMIN C) 500 MG tablet Take 1 tablet (500 mg total) by mouth daily. 30 tablet 2   clopidogrel (PLAVIX) 75 MG tablet Take 1 tablet (75 mg  total) by mouth daily. 90 tablet 3   divalproex (DEPAKOTE) 250 MG DR tablet Take 1 tablet by mouth See admin instructions. 1 tablet in the morning and 2 tablets every evening     guaiFENesin (MUCINEX) 600 MG 12 hr tablet Take 1 tablet (600 mg total) by mouth 2 (two) times daily for 10 days. 20 tablet 0   levothyroxine (SYNTHROID) 75 MCG tablet Take 1 tablet (75 mcg total) by mouth daily. 30 tablet 11   melatonin 5 MG TABS Take 5 mg by mouth.     memantine (NAMENDA) 5 MG tablet Take 1 tablet (5 mg total) by mouth 2 (two) times daily. 180 tablet 3   Multiple Vitamin (MULTIVITAMIN) tablet Take 1 tablet by mouth daily.     OVER THE COUNTER MEDICATION Take 1 tablet by mouth daily. magnesium     pantoprazole (PROTONIX) 40 MG tablet Take 1 tablet (40 mg total) by mouth daily. 30 tablet 11   PARoxetine (PAXIL) 40 MG tablet Take 40 mg by mouth every morning.     rosuvastatin (CRESTOR) 5 MG tablet TAKE 1 TABLET BY MOUTH DAILY. HOLD CRESTOR WHILE TAKING PAXLOVID 30 tablet 8   senna-docusate (SENOKOT-S) 8.6-50 MG tablet Take 2 tablets by mouth at bedtime. 180 tablet 3   No current facility-administered medications on file prior to visit.        ROS:  All others reviewed and negative.  Objective        PE:  BP 130/78 (BP Location: Left Arm, Patient Position: Sitting, Cuff Size: Normal)   Pulse 69   Temp 98.5 F (36.9 C) (Oral)   Ht 5\' 2"  (1.575 m)   Wt 140 lb (63.5 kg)   SpO2 100%   BMI 25.61 kg/m                 Constitutional: Pt appears in NAD but fatigued in wheelchair               HENT: Head: NCAT.                Right Ear: External ear normal.                 Left Ear: External ear normal.                Eyes: . Pupils are equal, round, and reactive to light. Conjunctivae and EOM are normal               Nose: without d/c or deformity  Neck: Neck supple. Gross normal ROM               Cardiovascular: Normal rate and regular rhythm.                 Pulmonary/Chest: Effort  normal and breath sounds without rales or wheezing.                Abd:  Soft, NT, ND, + BS, no organomegaly               Neurological: Pt is alert. At baseline orientation, motor grossly intact               Skin: Skin is warm. No rashes, no other new lesions, LE edema - none               Psychiatric: Pt behavior is normal without agitation   Micro: none  Cardiac tracings I have personally interpreted today:  none  Pertinent Radiological findings (summarize): none   Lab Results  Component Value Date   WBC 7.6 05/18/2023   HGB 11.8 (L) 05/18/2023   HCT 36.9 05/18/2023   PLT 204 05/18/2023   GLUCOSE 100 (H) 05/18/2023   CHOL 178 04/10/2023   TRIG 164.0 (H) 04/10/2023   HDL 50.50 04/10/2023   LDLDIRECT 137.5 10/19/2012   LDLCALC 94 04/10/2023   ALT 14 05/17/2023   AST 16 05/17/2023   NA 140 05/18/2023   K 4.1 05/18/2023   CL 105 05/18/2023   CREATININE 0.88 05/18/2023   BUN 19 05/18/2023   CO2 26 05/18/2023   TSH 0.839 05/17/2023   INR 1.1 05/18/2023   HGBA1C 6.3 04/10/2023   Assessment/Plan:  Susan Davidson is a 72 y.o. White or Caucasian [1] female with  has a past medical history of ANXIETY (02/12/2008), CVA (cerebral vascular accident) (HCC), DEPRESSION (02/12/2008), HYPERLIPIDEMIA (02/12/2008), HYPERTENSION (02/12/2008), HYPOTHYROIDISM (02/12/2008), Impaired glucose tolerance (08/27/2011), Left hemiparesis (HCC), OSTEOPENIA (02/12/2008), and VITAMIN D DEFICIENCY (04/14/2010).  Community acquired pneumonia Clinically resolved, declines f/u cxr or labs today  Dysphagia For contd diet as per ST  Generalized weakness Also for PT and OT in the home as started  CKD (chronic kidney disease) stage 3, GFR 30-59 ml/min (HCC) Lab Results  Component Value Date   CREATININE 0.88 05/18/2023   Stable overall, cont to avoid nephrotoxins  Followup: Return in about 4 months (around 09/28/2023).  Oliver Barre, MD 05/29/2023 10:01 AM [facility] - [facility] Internal Medicine

## 2023-05-29 NOTE — Patient Instructions (Signed)
You will be contacted regarding the referral for: Prevnar 20 pneumonia shot  Please continue all other medications as before, and refills have been done if requested.  Please have the pharmacy call with any other refills you may need.  Please continue your efforts at being more active, low cholesterol diet, and weight control.  Please keep your appointments with your specialists as you may have planned  - Dr Betti Cruz soon  We can hold on further lab testing today  Please make an Appointment to return in 4 months, or sooner if needed

## 2023-05-29 NOTE — Assessment & Plan Note (Signed)
Clinically resolved, declines f/u cxr or labs today

## 2023-05-29 NOTE — Addendum Note (Signed)
Addended by: Aundra Millet on: 05/29/2023 10:13 AM   Modules accepted: Orders

## 2023-05-29 NOTE — Assessment & Plan Note (Signed)
Also for PT and OT in the home as started

## 2023-05-29 NOTE — Assessment & Plan Note (Signed)
For contd diet as per ST

## 2023-05-29 NOTE — Assessment & Plan Note (Signed)
Lab Results  Component Value Date   CREATININE 0.88 05/18/2023   Stable overall, cont to avoid nephrotoxins

## 2023-05-30 ENCOUNTER — Other Ambulatory Visit: Payer: Self-pay | Admitting: Internal Medicine

## 2023-05-31 DIAGNOSIS — G9341 Metabolic encephalopathy: Secondary | ICD-10-CM | POA: Diagnosis not present

## 2023-05-31 DIAGNOSIS — F32A Depression, unspecified: Secondary | ICD-10-CM | POA: Diagnosis not present

## 2023-05-31 DIAGNOSIS — I671 Cerebral aneurysm, nonruptured: Secondary | ICD-10-CM | POA: Diagnosis not present

## 2023-05-31 DIAGNOSIS — I10 Essential (primary) hypertension: Secondary | ICD-10-CM | POA: Diagnosis not present

## 2023-05-31 DIAGNOSIS — J189 Pneumonia, unspecified organism: Secondary | ICD-10-CM | POA: Diagnosis not present

## 2023-05-31 DIAGNOSIS — I69354 Hemiplegia and hemiparesis following cerebral infarction affecting left non-dominant side: Secondary | ICD-10-CM | POA: Diagnosis not present

## 2023-05-31 DIAGNOSIS — F0394 Unspecified dementia, unspecified severity, with anxiety: Secondary | ICD-10-CM | POA: Diagnosis not present

## 2023-05-31 DIAGNOSIS — F0393 Unspecified dementia, unspecified severity, with mood disturbance: Secondary | ICD-10-CM | POA: Diagnosis not present

## 2023-05-31 DIAGNOSIS — E119 Type 2 diabetes mellitus without complications: Secondary | ICD-10-CM | POA: Diagnosis not present

## 2023-06-05 DIAGNOSIS — G9341 Metabolic encephalopathy: Secondary | ICD-10-CM | POA: Diagnosis not present

## 2023-06-05 DIAGNOSIS — F0393 Unspecified dementia, unspecified severity, with mood disturbance: Secondary | ICD-10-CM | POA: Diagnosis not present

## 2023-06-05 DIAGNOSIS — I10 Essential (primary) hypertension: Secondary | ICD-10-CM | POA: Diagnosis not present

## 2023-06-05 DIAGNOSIS — F0394 Unspecified dementia, unspecified severity, with anxiety: Secondary | ICD-10-CM | POA: Diagnosis not present

## 2023-06-05 DIAGNOSIS — E119 Type 2 diabetes mellitus without complications: Secondary | ICD-10-CM | POA: Diagnosis not present

## 2023-06-05 DIAGNOSIS — F32A Depression, unspecified: Secondary | ICD-10-CM | POA: Diagnosis not present

## 2023-06-05 DIAGNOSIS — I671 Cerebral aneurysm, nonruptured: Secondary | ICD-10-CM | POA: Diagnosis not present

## 2023-06-05 DIAGNOSIS — J189 Pneumonia, unspecified organism: Secondary | ICD-10-CM | POA: Diagnosis not present

## 2023-06-05 DIAGNOSIS — I69354 Hemiplegia and hemiparesis following cerebral infarction affecting left non-dominant side: Secondary | ICD-10-CM | POA: Diagnosis not present

## 2023-06-06 DIAGNOSIS — F3181 Bipolar II disorder: Secondary | ICD-10-CM | POA: Diagnosis not present

## 2023-06-06 DIAGNOSIS — F429 Obsessive-compulsive disorder, unspecified: Secondary | ICD-10-CM | POA: Diagnosis not present

## 2023-06-07 DIAGNOSIS — J189 Pneumonia, unspecified organism: Secondary | ICD-10-CM | POA: Diagnosis not present

## 2023-06-07 DIAGNOSIS — I69354 Hemiplegia and hemiparesis following cerebral infarction affecting left non-dominant side: Secondary | ICD-10-CM | POA: Diagnosis not present

## 2023-06-07 DIAGNOSIS — E119 Type 2 diabetes mellitus without complications: Secondary | ICD-10-CM | POA: Diagnosis not present

## 2023-06-07 DIAGNOSIS — I671 Cerebral aneurysm, nonruptured: Secondary | ICD-10-CM | POA: Diagnosis not present

## 2023-06-07 DIAGNOSIS — I10 Essential (primary) hypertension: Secondary | ICD-10-CM | POA: Diagnosis not present

## 2023-06-07 DIAGNOSIS — F32A Depression, unspecified: Secondary | ICD-10-CM | POA: Diagnosis not present

## 2023-06-07 DIAGNOSIS — F0393 Unspecified dementia, unspecified severity, with mood disturbance: Secondary | ICD-10-CM | POA: Diagnosis not present

## 2023-06-07 DIAGNOSIS — F0394 Unspecified dementia, unspecified severity, with anxiety: Secondary | ICD-10-CM | POA: Diagnosis not present

## 2023-06-07 DIAGNOSIS — G9341 Metabolic encephalopathy: Secondary | ICD-10-CM | POA: Diagnosis not present

## 2023-06-08 ENCOUNTER — Ambulatory Visit: Payer: Self-pay

## 2023-06-08 ENCOUNTER — Telehealth: Payer: Self-pay | Admitting: Internal Medicine

## 2023-06-08 MED ORDER — PANTOPRAZOLE SODIUM 40 MG PO TBEC: 40.0000 mg | DELAYED_RELEASE_TABLET | Freq: Every day | ORAL | 3 refills | Status: AC

## 2023-06-08 MED ORDER — CLOPIDOGREL BISULFATE 75 MG PO TABS: 75.0000 mg | ORAL_TABLET | Freq: Every day | ORAL | 3 refills | Status: DC

## 2023-06-08 MED ORDER — ROSUVASTATIN CALCIUM 5 MG PO TABS: ORAL_TABLET | ORAL | 3 refills | Status: DC

## 2023-06-08 MED ORDER — MEMANTINE HCL 5 MG PO TABS: 5.0000 mg | ORAL_TABLET | Freq: Two times a day (BID) | ORAL | 3 refills | Status: DC

## 2023-06-08 NOTE — Telephone Encounter (Signed)
Patient was called and a voicemail was left , letting the patient know that the labs that was done in the hospital , we will be able to use them. No further labs need to be drawn.

## 2023-06-08 NOTE — Telephone Encounter (Signed)
Mr. Karson left a message. He is asking if the Lab Work that the patient had while in the hospital 2 1/2 weeks ago , would be okay for the labs that was ordered for her to have prior to her office visit, 06/16/23. A T4 free and a TSH was ordered, They are resulted in the labs from hospital.

## 2023-06-08 NOTE — Patient Instructions (Signed)
Visit Information  Thank you for taking time to visit with me today. Please don't hesitate to contact me if I can be of assistance to you.   Following are the goals we discussed today:  Continue to take medications as prescribed. Continue to attend provider visits as scheduled Continue to eat healthy, lean meats, vegetables, fruits, avoid saturated and transfats I have reached out to primary care regarding your request for Clopidogrel refill. Please reach out to your Primary provider is still unable to obtain refill. Our next appointment is by telephone on 06/28/23 at 10:30 am  Please call the care guide team at [phone] if you need to cancel or reschedule your appointment.   If you are experiencing a Mental Health or Behavioral Health Crisis or need someone to talk to, please call the Suicide and Crisis Lifeline: 988 call the [place] National Suicide Prevention Lifeline: (202) 278-5863 or TTY: 986 011 7430 TTY (512)643-9593) to talk to a trained counselor call 1-800-273-TALK (toll free, 24 hour hotline)  Kathyrn Sheriff, RN, MSN, BSN, CCM Care Management Coordinator [phone]

## 2023-06-08 NOTE — Patient Outreach (Signed)
  Care Coordination   Initial Visit Note   06/08/2023 Name: Susan Davidson MRN: [id number] DOB: 14-Oct-1950  Susan Davidson is a 72 y.o. year old female who sees Susan Levins, MD for primary care. I spoke with  spouse Susan Davidson by phone today.  What matters to the patients health and wellness today?  Susan Davidson reports Susan Davidson is improving. He states she has started eating better, she has taken all antibiotics as prescribed and followed up with primary care provider. He states he has requested a refill on Clopidogrel and pharmacy reports is still awaiting the prescription from provider.  Goals Addressed             This Visit's Progress    Care Coordination Activities       Interventions Today    Flowsheet Row Most Recent Value  Chronic Disease   Chronic disease during today's visit Other  [metabolic encephalopathy secondary to CAP]  General Interventions   General Interventions Discussed/Reviewed General Interventions Discussed, Communication with  [assessed for care management needs]  Doctor Visits Discussed/Reviewed Doctor Visits Discussed, PCP  PCP/Specialist Visits Compliance with follow-up visit  [reviewed upcoming/scheduled appointments]  Communication with PCP/Specialists  [request for medication refill-clopidogrel]  Exercise Interventions   Exercise Discussed/Reviewed Exercise Discussed  [confirmed active with home health PT/OT/SP]  Education Interventions   Provided Verbal Education On Medication, Other  [discussed medication refills. advised take medications as prescribed, attend provider visits as scheduled.]            SDOH assessments and interventions completed:  No  Care Coordination Interventions:  Yes, provided   Follow up plan: Follow up call scheduled for 06/28/23    Encounter Outcome:  Patient Visit Completed   Susan Sheriff, RN, MSN, BSN, CCM Care Management Coordinator [phone]

## 2023-06-08 NOTE — Telephone Encounter (Signed)
Ok done erx 

## 2023-06-08 NOTE — Telephone Encounter (Signed)
-----   Message from Nurse Aloha Gell sent at 06/08/2023 10:12 AM EDT ----- Regarding: re: patient refill request Hi Dr. Jonny Ruiz,  I spoke with Mr. Pellerin and he reports Mrs. Hrncir is out of Clopidogrel and request you send a prescription over to her pharmacy [facility] Thank you.  Kathyrn Sheriff, RN, MSN, BSN, CCM Care Management Coordinator [phone]

## 2023-06-08 NOTE — Telephone Encounter (Signed)
Yes, the ones from 8/14 when she was in the hospital will work for her upcoming appt.

## 2023-06-08 NOTE — Telephone Encounter (Signed)
Prescription Request  06/08/2023  LOV: 05/29/2023  What is the name of the medication or equipment? clopidogril  Have you contacted your pharmacy to request a refill? Yes   Which pharmacy would you like this sent to?  [id number] - [place], [place] - [address] AT [facility] OF [address] [place] [address] Phone: [phone] Fax: [phone]    Patient notified that their request is being sent to the clinical staff for review and that they should receive a response within 2 business days.   Please advise at Mobile [phone] (mobile)

## 2023-06-14 DIAGNOSIS — I69354 Hemiplegia and hemiparesis following cerebral infarction affecting left non-dominant side: Secondary | ICD-10-CM | POA: Diagnosis not present

## 2023-06-14 DIAGNOSIS — F0394 Unspecified dementia, unspecified severity, with anxiety: Secondary | ICD-10-CM | POA: Diagnosis not present

## 2023-06-14 DIAGNOSIS — I10 Essential (primary) hypertension: Secondary | ICD-10-CM | POA: Diagnosis not present

## 2023-06-14 DIAGNOSIS — G9341 Metabolic encephalopathy: Secondary | ICD-10-CM | POA: Diagnosis not present

## 2023-06-14 DIAGNOSIS — J189 Pneumonia, unspecified organism: Secondary | ICD-10-CM | POA: Diagnosis not present

## 2023-06-14 DIAGNOSIS — F0393 Unspecified dementia, unspecified severity, with mood disturbance: Secondary | ICD-10-CM | POA: Diagnosis not present

## 2023-06-14 DIAGNOSIS — F32A Depression, unspecified: Secondary | ICD-10-CM | POA: Diagnosis not present

## 2023-06-14 DIAGNOSIS — E119 Type 2 diabetes mellitus without complications: Secondary | ICD-10-CM | POA: Diagnosis not present

## 2023-06-14 DIAGNOSIS — I671 Cerebral aneurysm, nonruptured: Secondary | ICD-10-CM | POA: Diagnosis not present

## 2023-06-14 NOTE — Patient Instructions (Signed)

## 2023-06-15 DIAGNOSIS — F32A Depression, unspecified: Secondary | ICD-10-CM | POA: Diagnosis not present

## 2023-06-15 DIAGNOSIS — F0394 Unspecified dementia, unspecified severity, with anxiety: Secondary | ICD-10-CM | POA: Diagnosis not present

## 2023-06-15 DIAGNOSIS — G9341 Metabolic encephalopathy: Secondary | ICD-10-CM | POA: Diagnosis not present

## 2023-06-15 DIAGNOSIS — I671 Cerebral aneurysm, nonruptured: Secondary | ICD-10-CM | POA: Diagnosis not present

## 2023-06-15 DIAGNOSIS — I69354 Hemiplegia and hemiparesis following cerebral infarction affecting left non-dominant side: Secondary | ICD-10-CM | POA: Diagnosis not present

## 2023-06-15 DIAGNOSIS — F0393 Unspecified dementia, unspecified severity, with mood disturbance: Secondary | ICD-10-CM | POA: Diagnosis not present

## 2023-06-15 DIAGNOSIS — I10 Essential (primary) hypertension: Secondary | ICD-10-CM | POA: Diagnosis not present

## 2023-06-15 DIAGNOSIS — E119 Type 2 diabetes mellitus without complications: Secondary | ICD-10-CM | POA: Diagnosis not present

## 2023-06-15 DIAGNOSIS — J189 Pneumonia, unspecified organism: Secondary | ICD-10-CM | POA: Diagnosis not present

## 2023-06-16 ENCOUNTER — Ambulatory Visit (INDEPENDENT_AMBULATORY_CARE_PROVIDER_SITE_OTHER): Payer: Medicare HMO | Admitting: Nurse Practitioner

## 2023-06-16 ENCOUNTER — Encounter: Payer: Self-pay | Admitting: Nurse Practitioner

## 2023-06-16 VITALS — BP 118/70 | HR 64 | Ht 62.0 in | Wt 140.0 lb

## 2023-06-16 DIAGNOSIS — E890 Postprocedural hypothyroidism: Secondary | ICD-10-CM

## 2023-06-16 NOTE — Progress Notes (Signed)
Endocrinology Follow Up Note                                         06/16/2023, 9:56 AM  Subjective:   Subjective    Susan Davidson is a 72 y.o.-year-old female patient being seen in follow up after being seen in consultation for hypothyroidism referred by Corwin Levins, MD.   Past Medical History:  Diagnosis Date   ANXIETY 02/12/2008   Qualifier: Diagnosis of  By: Jonny Ruiz MD, Len Blalock    CVA (cerebral vascular accident) [facility])    DEPRESSION 02/12/2008   Qualifier: Diagnosis of  By: Maris Berger    HYPERLIPIDEMIA 02/12/2008   Qualifier: Diagnosis of  By: Maris Berger    HYPERTENSION 02/12/2008   Qualifier: Diagnosis of  By: Maris Berger    HYPOTHYROIDISM 02/12/2008   Qualifier: Diagnosis of  By: Maris Berger    Impaired glucose tolerance 08/27/2011   Left hemiparesis (HCC)    OSTEOPENIA 02/12/2008   Qualifier: Diagnosis of  By: Jonny Ruiz MD, Len Blalock    VITAMIN D DEFICIENCY 04/14/2010   Qualifier: Diagnosis of  By: Jonny Ruiz MD, Len Blalock     Past Surgical History:  Procedure Laterality Date   IR RADIOLOGIST EVAL & MGMT  08/18/2021    Social History   Socioeconomic History   Marital status: Married    Spouse name: Not on file   Number of children: 0   Years of education: Not on file   Highest education level: Not on file  Occupational History   Occupation: disabled  Tobacco Use   Smoking status: Former    Current packs/day: 0.00    Types: Cigarettes    Quit date: 01/26/2021    Years since quitting: 2.3   Smokeless tobacco: Never  Vaping Use   Vaping status: Never Used  Substance and Sexual Activity   Alcohol use: Not Currently   Drug use: No   Sexual activity: Not on file  Other Topics Concern   Not on file  Social History Narrative   Not on file   Social Determinants of Health   Financial Resource Strain: Low Risk  (04/27/2023)   Overall Financial Resource  Strain (CARDIA)    Difficulty of Paying Living Expenses: Not hard at all  Food Insecurity: No Food Insecurity (05/22/2023)   Hunger Vital Sign    Worried About Running Out of Food in the Last Year: Never true    Ran Out of Food in the Last Year: Never true  Transportation Needs: No Transportation Needs (05/22/2023)   PRAPARE - Transportation    Lack of Transportation (Medical): No    Lack of Transportation (Non-Medical): No  Physical Activity: Inactive (04/27/2023)   Exercise Vital Sign    Days of Exercise per Week: 0 days    Minutes of Exercise per Session: 0 min  Stress: Stress Concern Present (04/27/2023)   [employer] of Occupational Health - Occupational Stress Questionnaire    Feeling of Stress :  To some extent  Social Connections: Socially Isolated (04/27/2023)   Social Connection and Isolation Panel [NHANES]    Frequency of Communication with Friends and Family: Never    Frequency of Social Gatherings with Friends and Family: Never    Attends Religious Services: Never    Database administrator or Organizations: No    Attends Engineer, structural: Never    Marital Status: Married    Family History  Problem Relation Age of Onset   Heart disease Father    Bipolar disorder Sister    Diabetes Neg Hx     Outpatient Encounter Medications as of 06/16/2023  Medication Sig   acetaminophen (TYLENOL) 325 MG tablet Take 2 tablets (650 mg total) by mouth every 6 (six) hours as needed for mild pain or headache (fever >/= 101).   albuterol (VENTOLIN HFA) 108 (90 Base) MCG/ACT inhaler Inhale 2 puffs into the lungs every 6 (six) hours as needed for wheezing or shortness of breath.   alendronate (FOSAMAX) 70 MG tablet Take 1 tablet (70 mg total) by mouth every 7 (seven) days. Take with a full glass of water on an empty stomach.   ALPRAZolam (XANAX) 0.5 MG tablet TAKE 1 TABLET(0.5 MG) BY MOUTH TWICE DAILY AS NEEDED FOR ANXIETY   ascorbic acid (VITAMIN C) 500 MG tablet Take 1  tablet (500 mg total) by mouth daily.   clopidogrel (PLAVIX) 75 MG tablet Take 1 tablet (75 mg total) by mouth daily.   divalproex (DEPAKOTE) 250 MG DR tablet Take 1 tablet by mouth See admin instructions. 1 tablet in the morning and 2 tablets every evening   levothyroxine (SYNTHROID) 75 MCG tablet Take 1 tablet (75 mcg total) by mouth daily.   melatonin 5 MG TABS Take 5 mg by mouth.   memantine (NAMENDA) 5 MG tablet Take 1 tablet (5 mg total) by mouth 2 (two) times daily.   Multiple Vitamin (MULTIVITAMIN) tablet Take 1 tablet by mouth daily.   OVER THE COUNTER MEDICATION Take 1 tablet by mouth daily. magnesium   pantoprazole (PROTONIX) 40 MG tablet Take 1 tablet (40 mg total) by mouth daily.   PARoxetine (PAXIL) 40 MG tablet Take 40 mg by mouth every morning.   rosuvastatin (CRESTOR) 5 MG tablet TAKE 1 TABLET BY MOUTH DAILY   senna-docusate (SENOKOT-S) 8.6-50 MG tablet Take 2 tablets by mouth at bedtime.   No facility-administered encounter medications on file as of 06/16/2023.    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   Epinephrine    Influenza Vac Split Quad     Fatigue, mimics a "super bad flu"   VACCINATION STATUS: Immunization History  Administered Date(s) Administered   Influenza-Unspecified 08/18/2021   Moderna Sars-Covid-2 Vaccination 12/02/2019   PNEUMOCOCCAL CONJUGATE-20 05/29/2023   Tdap 10/19/2012     HPI   Susan Davidson was diagnosed with hyperthyroidism at approximate age of 20 years, which required RAI ablation and subsequent initiation of thyroid hormone replacement. she was given various doses of Levothyroxine over the years, currently on 75 mcg po daily before breakfast.  She has been taking this medication  consistently.  Her previsit thyroid function tests are consistent with appropriate replacement.  She has no new complaints.  She remains wheelchair-bound. She is living  at home, had previously been in/out of rehab facilitys in the past.  Pt denies feeling nodules in neck, hoarseness, dysphagia/odynophagia, SOB with lying down.  she denies family history of thyroid disorders.  No family history of thyroid cancer.   No recent use of iodine supplements.  Denies use of Biotin containing supplements.  She has never had any previous imaging of her thyroid in the past.  I reviewed her chart and she also has a history of CVA, encephalopathy, CKD, depression, HLD, Osteopenia, vitamin D deficiency.   Review of systems  Constitutional: + Minimally fluctuating body weight,  current Body mass index is 25.61 kg/m. , no fatigue, no subjective hyperthermia, + subjective hypothermia Eyes: no blurry vision, no xerophthalmia ENT: no sore throat, no nodules palpated in throat, no dysphagia/odynophagia, no hoarseness Cardiovascular: no chest pain, no shortness of breath, no palpitations, no leg swelling Respiratory: no cough, no shortness of breath Gastrointestinal: no nausea/vomiting/diarrhea Musculoskeletal: no muscle/joint aches, WC bound from previous stroke Skin: no rashes, no hyperemia Neurological: no tremors, no numbness, no tingling, no dizziness Psychiatric: no depression, no anxiety   Objective:   Objective     BP 118/70 (BP Location: Right Arm, Patient Position: Sitting, Cuff Size: Large)   Pulse 64   Ht 5\' 2"  (1.575 m)   Wt 140 lb (63.5 kg) Comment: Per patient's husband- patient wheel chair  BMI 25.61 kg/m  Wt Readings from Last 3 Encounters:  06/16/23 140 lb (63.5 kg)  05/29/23 140 lb (63.5 kg)  05/17/23 139 lb 15.9 oz (63.5 kg)    BP Readings from Last 3 Encounters:  06/16/23 118/70  05/29/23 130/78  05/19/23 (!) 137/59      Physical Exam- Limited  Constitutional:  Body  mass index is 25.61 kg/m. , not in acute distress, normal state of mind, pale complexion Musculoskeletal: no gross deformities, WC bound after past CVA    CMP ( most recent) CMP     Component Value Date/Time   NA 140 05/18/2023 0522   K 4.1 05/18/2023 0522   CL 105 05/18/2023 0522   CO2 26 05/18/2023 0522   GLUCOSE 100 (H) 05/18/2023 0522   BUN 19 05/18/2023 0522   CREATININE 0.88 05/18/2023 0522   CALCIUM 8.3 (L) 05/18/2023 0522   PROT 7.4 05/17/2023 1146   ALBUMIN 3.3 (L) 05/17/2023 1146   AST 16 05/17/2023 1146   ALT 14 05/17/2023 1146   ALKPHOS 59 05/17/2023 1146   BILITOT 0.7 05/17/2023 1146   GFRNONAA >60 05/18/2023 0522   GFRAA 73 02/12/2008 1525     Diabetic Labs (most recent): Lab Results  Component Value Date   HGBA1C 6.3 04/10/2023   HGBA1C 6.1 01/06/2022   HGBA1C 6.1 10/29/2021     Lipid Panel ( most recent) Lipid Panel     Component Value Date/Time   CHOL 178 04/10/2023 1022   TRIG 164.0 (H) 04/10/2023 1022   HDL 50.50 04/10/2023 1022   CHOLHDL 4 04/10/2023 1022   VLDL 32.8 04/10/2023 1022   LDLCALC 94 04/10/2023 1022   LDLDIRECT 137.5 10/19/2012 1106       Lab Results  Component Value Date   TSH 0.839 05/17/2023   TSH 4.86 04/10/2023   TSH 4.010 12/13/2022   TSH 2.270 05/30/2022   TSH 1.348 01/18/2022   TSH 1.38 01/06/2022   TSH 2.01 10/29/2021   TSH 1.402 08/03/2021   TSH 1.42 07/22/2021   TSH 20.460 (H) 05/05/2021   FREET4 1.95 (H) 05/17/2023   FREET4  1.30 12/13/2022   FREET4 1.48 05/30/2022   FREET4 0.66 01/28/2021   FREET4 0.38 (L) 11/09/2020     Latest Reference Range & Units 01/06/22 10:56 01/18/22 19:23 05/30/22 11:06 12/13/22 15:43 04/10/23 10:22 05/17/23 11:46  TSH 0.350 - 4.500 uIU/mL 1.38 1.348 2.270 4.010 4.86 0.839  T4,Free(Direct) 0.61 - 1.12 ng/dL   8.29 5.62  1.30 (H)  (H): Data is abnormally high   Assessment & Plan:   ASSESSMENT / PLAN:  1. Hypothyroidism-s/p RAI ablation for hyperthyroidism many years  ago   Patient with long-standing hypothyroidism, on Levothyroxine therapy. On physical exam, patient does not have gross goiter, thyroid nodules, or neck compression symptoms.     -Her previsit thyroid function tests done during recent hospitalization were consistent with slight over-replacement, therefore her dose of Levothyroxine was lowered to 75 mcg po daily before breakfast.  Her TFTs were not rechecked prior to today's appointment as it would have been too soon to determine if dosage was effective.  Will plan for her to have TFTs rechecked in 4 weeks and I will call with results and plan moving forward.    - We discussed about the correct intake of her thyroid hormone, on empty stomach at fasting, with water, separated by at least 30 minutes from breakfast and other medications,  and separated by more than 4 hours from calcium, iron, multivitamins, acid reflux medications (PPIs). -Patient is made aware of the fact that thyroid hormone replacement is needed for life, dose to be adjusted by periodic monitoring of thyroid function tests.    I spent  15  minutes in the care of the patient today including review of labs from Thyroid Function, CMP, and other relevant labs ; imaging/biopsy records (current and previous including abstractions from other facilities); face-to-face time discussing  her lab results and symptoms, medications doses, her options of short and long term treatment based on the latest standards of care / guidelines;   and documenting the encounter.  [name]  participated in the discussions, expressed understanding, and voiced agreement with the above plans.  All questions were answered to her satisfaction. she is encouraged to contact clinic should she have any questions or concerns prior to her return visit.    FOLLOW UP PLAN:  Return in about 4 weeks (around 07/14/2023), or recommend getting labs in 4 weeks aroun Oct 11th, will call with results and plan moving  forward, for Previsit labs, Thyroid follow up.  Ronny Bacon, [facility] [address] [place], [place] [address] Phone: [phone] Fax: [phone]  06/16/2023, 9:56 AM

## 2023-06-21 DIAGNOSIS — F0394 Unspecified dementia, unspecified severity, with anxiety: Secondary | ICD-10-CM | POA: Diagnosis not present

## 2023-06-21 DIAGNOSIS — E119 Type 2 diabetes mellitus without complications: Secondary | ICD-10-CM | POA: Diagnosis not present

## 2023-06-21 DIAGNOSIS — G9341 Metabolic encephalopathy: Secondary | ICD-10-CM | POA: Diagnosis not present

## 2023-06-21 DIAGNOSIS — I69354 Hemiplegia and hemiparesis following cerebral infarction affecting left non-dominant side: Secondary | ICD-10-CM | POA: Diagnosis not present

## 2023-06-21 DIAGNOSIS — I10 Essential (primary) hypertension: Secondary | ICD-10-CM | POA: Diagnosis not present

## 2023-06-21 DIAGNOSIS — J189 Pneumonia, unspecified organism: Secondary | ICD-10-CM | POA: Diagnosis not present

## 2023-06-21 DIAGNOSIS — I671 Cerebral aneurysm, nonruptured: Secondary | ICD-10-CM | POA: Diagnosis not present

## 2023-06-21 DIAGNOSIS — F32A Depression, unspecified: Secondary | ICD-10-CM | POA: Diagnosis not present

## 2023-06-21 DIAGNOSIS — F0393 Unspecified dementia, unspecified severity, with mood disturbance: Secondary | ICD-10-CM | POA: Diagnosis not present

## 2023-06-22 DIAGNOSIS — I69354 Hemiplegia and hemiparesis following cerebral infarction affecting left non-dominant side: Secondary | ICD-10-CM | POA: Diagnosis not present

## 2023-06-22 DIAGNOSIS — J189 Pneumonia, unspecified organism: Secondary | ICD-10-CM | POA: Diagnosis not present

## 2023-06-22 DIAGNOSIS — F32A Depression, unspecified: Secondary | ICD-10-CM | POA: Diagnosis not present

## 2023-06-22 DIAGNOSIS — I10 Essential (primary) hypertension: Secondary | ICD-10-CM | POA: Diagnosis not present

## 2023-06-22 DIAGNOSIS — G9341 Metabolic encephalopathy: Secondary | ICD-10-CM | POA: Diagnosis not present

## 2023-06-22 DIAGNOSIS — E119 Type 2 diabetes mellitus without complications: Secondary | ICD-10-CM | POA: Diagnosis not present

## 2023-06-22 DIAGNOSIS — F0393 Unspecified dementia, unspecified severity, with mood disturbance: Secondary | ICD-10-CM | POA: Diagnosis not present

## 2023-06-22 DIAGNOSIS — F0394 Unspecified dementia, unspecified severity, with anxiety: Secondary | ICD-10-CM | POA: Diagnosis not present

## 2023-06-22 DIAGNOSIS — I671 Cerebral aneurysm, nonruptured: Secondary | ICD-10-CM | POA: Diagnosis not present

## 2023-06-28 ENCOUNTER — Telehealth: Payer: Self-pay

## 2023-06-28 DIAGNOSIS — I69354 Hemiplegia and hemiparesis following cerebral infarction affecting left non-dominant side: Secondary | ICD-10-CM | POA: Diagnosis not present

## 2023-06-28 DIAGNOSIS — J189 Pneumonia, unspecified organism: Secondary | ICD-10-CM | POA: Diagnosis not present

## 2023-06-28 DIAGNOSIS — F32A Depression, unspecified: Secondary | ICD-10-CM | POA: Diagnosis not present

## 2023-06-28 DIAGNOSIS — I671 Cerebral aneurysm, nonruptured: Secondary | ICD-10-CM | POA: Diagnosis not present

## 2023-06-28 DIAGNOSIS — E119 Type 2 diabetes mellitus without complications: Secondary | ICD-10-CM | POA: Diagnosis not present

## 2023-06-28 DIAGNOSIS — G9341 Metabolic encephalopathy: Secondary | ICD-10-CM | POA: Diagnosis not present

## 2023-06-28 DIAGNOSIS — F0394 Unspecified dementia, unspecified severity, with anxiety: Secondary | ICD-10-CM | POA: Diagnosis not present

## 2023-06-28 DIAGNOSIS — I10 Essential (primary) hypertension: Secondary | ICD-10-CM | POA: Diagnosis not present

## 2023-06-28 DIAGNOSIS — F0393 Unspecified dementia, unspecified severity, with mood disturbance: Secondary | ICD-10-CM | POA: Diagnosis not present

## 2023-06-28 NOTE — Patient Outreach (Signed)
Care Coordination   06/28/2023 Name: Susan Davidson MRN: [id number] DOB: December 17, 1950   Care Coordination Outreach Attempts:  An unsuccessful telephone outreach was attempted for a scheduled appointment today.  Follow Up Plan:  Additional outreach attempts will be made to offer the patient care coordination information and services.   Encounter Outcome:  No Answer   Care Coordination Interventions:  No, not indicated    Kathyrn Sheriff, RN, MSN, BSN, CCM Care Management Coordinator [phone]

## 2023-06-29 DIAGNOSIS — J189 Pneumonia, unspecified organism: Secondary | ICD-10-CM | POA: Diagnosis not present

## 2023-06-29 DIAGNOSIS — I671 Cerebral aneurysm, nonruptured: Secondary | ICD-10-CM | POA: Diagnosis not present

## 2023-06-29 DIAGNOSIS — F0394 Unspecified dementia, unspecified severity, with anxiety: Secondary | ICD-10-CM | POA: Diagnosis not present

## 2023-06-29 DIAGNOSIS — E119 Type 2 diabetes mellitus without complications: Secondary | ICD-10-CM | POA: Diagnosis not present

## 2023-06-29 DIAGNOSIS — I69354 Hemiplegia and hemiparesis following cerebral infarction affecting left non-dominant side: Secondary | ICD-10-CM | POA: Diagnosis not present

## 2023-06-29 DIAGNOSIS — F0393 Unspecified dementia, unspecified severity, with mood disturbance: Secondary | ICD-10-CM | POA: Diagnosis not present

## 2023-06-29 DIAGNOSIS — I10 Essential (primary) hypertension: Secondary | ICD-10-CM | POA: Diagnosis not present

## 2023-06-29 DIAGNOSIS — G9341 Metabolic encephalopathy: Secondary | ICD-10-CM | POA: Diagnosis not present

## 2023-06-29 DIAGNOSIS — F32A Depression, unspecified: Secondary | ICD-10-CM | POA: Diagnosis not present

## 2023-07-03 DIAGNOSIS — G9341 Metabolic encephalopathy: Secondary | ICD-10-CM | POA: Diagnosis not present

## 2023-07-03 DIAGNOSIS — I10 Essential (primary) hypertension: Secondary | ICD-10-CM | POA: Diagnosis not present

## 2023-07-03 DIAGNOSIS — E119 Type 2 diabetes mellitus without complications: Secondary | ICD-10-CM | POA: Diagnosis not present

## 2023-07-03 DIAGNOSIS — F0394 Unspecified dementia, unspecified severity, with anxiety: Secondary | ICD-10-CM | POA: Diagnosis not present

## 2023-07-03 DIAGNOSIS — F0393 Unspecified dementia, unspecified severity, with mood disturbance: Secondary | ICD-10-CM | POA: Diagnosis not present

## 2023-07-03 DIAGNOSIS — I69354 Hemiplegia and hemiparesis following cerebral infarction affecting left non-dominant side: Secondary | ICD-10-CM | POA: Diagnosis not present

## 2023-07-03 DIAGNOSIS — F32A Depression, unspecified: Secondary | ICD-10-CM | POA: Diagnosis not present

## 2023-07-03 DIAGNOSIS — J189 Pneumonia, unspecified organism: Secondary | ICD-10-CM | POA: Diagnosis not present

## 2023-07-03 DIAGNOSIS — I671 Cerebral aneurysm, nonruptured: Secondary | ICD-10-CM | POA: Diagnosis not present

## 2023-07-04 ENCOUNTER — Telehealth: Payer: Self-pay | Admitting: *Deleted

## 2023-07-04 NOTE — Progress Notes (Signed)
Care Coordination Note  07/04/2023 Name: Susan Davidson MRN: [id number] DOB: 1951-05-07  Susan Davidson is a 72 y.o. year old female who is a primary care patient of Corwin Levins, MD and is actively engaged with the care management team. I reached out to [name] by phone today to assist with re-scheduling a follow up visit with the RN Case Manager  Follow up plan: Unsuccessful telephone outreach attempt made. A HIPAA compliant phone message was left for the patient providing contact information and requesting a return call.   Burman Nieves, CCMA Care Coordination Care Guide Direct Dial: [phone]

## 2023-07-04 NOTE — Progress Notes (Signed)
Care Coordination Note  07/04/2023 Name: Susan Davidson MRN: [id number] DOB: August 05, 1951  Susan Davidson is a 72 y.o. year old female who is a primary care patient of Corwin Levins, MD and is actively engaged with the care management team. I reached out to [name] by phone today to assist with re-scheduling a follow up visit with the RN Case Manager  Follow up plan: Telephone appointment with care management team member scheduled for:07/20/2023  Burman Nieves, [facility] Care Coordination Care Guide Direct Dial: [phone]

## 2023-07-12 DIAGNOSIS — E119 Type 2 diabetes mellitus without complications: Secondary | ICD-10-CM | POA: Diagnosis not present

## 2023-07-12 DIAGNOSIS — F32A Depression, unspecified: Secondary | ICD-10-CM | POA: Diagnosis not present

## 2023-07-12 DIAGNOSIS — I10 Essential (primary) hypertension: Secondary | ICD-10-CM | POA: Diagnosis not present

## 2023-07-12 DIAGNOSIS — J189 Pneumonia, unspecified organism: Secondary | ICD-10-CM | POA: Diagnosis not present

## 2023-07-12 DIAGNOSIS — F0394 Unspecified dementia, unspecified severity, with anxiety: Secondary | ICD-10-CM | POA: Diagnosis not present

## 2023-07-12 DIAGNOSIS — G9341 Metabolic encephalopathy: Secondary | ICD-10-CM | POA: Diagnosis not present

## 2023-07-12 DIAGNOSIS — F0393 Unspecified dementia, unspecified severity, with mood disturbance: Secondary | ICD-10-CM | POA: Diagnosis not present

## 2023-07-12 DIAGNOSIS — I69354 Hemiplegia and hemiparesis following cerebral infarction affecting left non-dominant side: Secondary | ICD-10-CM | POA: Diagnosis not present

## 2023-07-12 DIAGNOSIS — I671 Cerebral aneurysm, nonruptured: Secondary | ICD-10-CM | POA: Diagnosis not present

## 2023-07-14 DIAGNOSIS — E890 Postprocedural hypothyroidism: Secondary | ICD-10-CM | POA: Diagnosis not present

## 2023-07-15 LAB — TSH: TSH: 3.81 u[IU]/mL (ref 0.450–4.500)

## 2023-07-15 LAB — T4, FREE: Free T4: 1.45 ng/dL (ref 0.82–1.77)

## 2023-07-17 ENCOUNTER — Other Ambulatory Visit: Payer: Self-pay | Admitting: Nurse Practitioner

## 2023-07-17 ENCOUNTER — Encounter: Payer: Self-pay | Admitting: Nurse Practitioner

## 2023-07-17 DIAGNOSIS — E890 Postprocedural hypothyroidism: Secondary | ICD-10-CM

## 2023-07-17 NOTE — Progress Notes (Signed)
Can you please reach out and get patient scheduled for follow up in 3 months with previsit labs?  Thanks

## 2023-07-20 ENCOUNTER — Ambulatory Visit: Payer: Self-pay

## 2023-07-20 NOTE — Patient Instructions (Signed)
Visit Information  Thank you for taking time to visit with me today. Please don't hesitate to contact me if I can be of assistance to you.   Following are the goals we discussed today:  Continue to take medications as prescribed. Continue to attend provider visits as scheduled Continue to eat healthy Contact provider with health questions or concerns  Our next appointment is by telephone on 08/21/23 at 9:00 am  Please call the care guide team at [phone] if you need to cancel or reschedule your appointment.   If you are experiencing a Mental Health or Behavioral Health Crisis or need someone to talk to, please call the Suicide and Crisis Lifeline: 988 call the [place] National Suicide Prevention Lifeline: 830-268-2611 or TTY: 929-531-7051 TTY 306 264 0747) to talk to a trained counselor call 1-800-273-TALK (toll free, 24 hour hotline)  Kathyrn Sheriff, RN, MSN, BSN, CCM Care Management Coordinator [phone]

## 2023-07-20 NOTE — Patient Outreach (Signed)
Care Coordination   Follow Up Visit Note   07/20/2023 Name: Susan Davidson MRN: [id number] DOB: 1951-08-23  Susan Davidson is a 72 y.o. year old female who sees Susan Levins, MD for primary care. I spoke with Susan Davidson(spouse/dpr) by phone today.  What matters to the patients health and wellness today?  Susan Davidson reports patient has improved. He reports her appetite has also improved some, althought she continues to "eat light". Reports getting medications down better, but still "slowhandling". Denies any breathing problems. Reports patient has an appointment with [facility] in [place] on 08/02/23. He is unclear if it is for just Palliative care or Hospice services, but will get clarification at appointment. He reports patient has in home assistance through [employer] program with [employer]. Susan Davidson denies need for caregiver stress resources at this time.  Goals Addressed             This Visit's Progress    Care Coordination Activities       Interventions Today    Flowsheet Row Most Recent Value  Chronic Disease   Chronic disease during today's visit Other, Chronic Kidney Disease/End Stage Renal Disease (ESRD), Hypertension (HTN)  [generalized weakness, dysphasia, h/o CAP, CVA]  General Interventions   General Interventions Discussed/Reviewed General Interventions Reviewed, Doctor Visits  [Evaluation of current treatment plan for health condition and patient's adherence to plan.]  Doctor Visits Discussed/Reviewed Doctor Visits Discussed  [reviewed upcoming follow up appointments]  Education Interventions   Education Provided Provided Education  Provided Verbal Education On When to see the doctor, Medication  [advised continue to take medications as presceibed, attend provider visit as scheduled, contact provider with health questions concerns as needed]  Mental Health Interventions   Mental Health Discussed/Reviewed --  [discussed LCSW available to talke re  caregiver stress support]  Nutrition Interventions   Nutrition Discussed/Reviewed Nutrition Reviewed  Pharmacy Interventions   Pharmacy Dicussed/Reviewed Pharmacy Topics Reviewed  Safety Interventions   Safety Discussed/Reviewed Safety Discussed  Advanced Directive Interventions   End of Life Palliative, Hospice  [discussed hospice and palliative care with spouse]            SDOH assessments and interventions completed:  No  Care Coordination Interventions:  Yes, provided   Follow up plan: Follow up call scheduled for 08/21/23    Encounter Outcome:  Patient Visit Completed   Susan Sheriff, RN, MSN, BSN, CCM Care Management Coordinator [phone]

## 2023-07-24 ENCOUNTER — Ambulatory Visit (INDEPENDENT_AMBULATORY_CARE_PROVIDER_SITE_OTHER): Payer: Medicare HMO | Admitting: Gastroenterology

## 2023-07-27 ENCOUNTER — Ambulatory Visit: Payer: Medicare HMO | Admitting: Podiatry

## 2023-07-28 ENCOUNTER — Encounter: Payer: Self-pay | Admitting: Podiatry

## 2023-07-28 ENCOUNTER — Ambulatory Visit (INDEPENDENT_AMBULATORY_CARE_PROVIDER_SITE_OTHER): Payer: Medicare HMO | Admitting: Podiatry

## 2023-07-28 DIAGNOSIS — B351 Tinea unguium: Secondary | ICD-10-CM | POA: Diagnosis not present

## 2023-07-28 DIAGNOSIS — M79675 Pain in left toe(s): Secondary | ICD-10-CM

## 2023-07-28 DIAGNOSIS — N1831 Chronic kidney disease, stage 3a: Secondary | ICD-10-CM

## 2023-07-28 DIAGNOSIS — M79674 Pain in right toe(s): Secondary | ICD-10-CM

## 2023-07-28 NOTE — Progress Notes (Signed)
This patient presents to the office with chief complaint of long thick painful nails.  Patient says the nails are painful walking and wearing shoes.  This patient is unable to self treat.  This patient is unable to trim her nails since she is unable to reach her nails.  She present to the office in a wheelchair accompanied by female caregiver. She presents to the office for preventative foot care services.  General Appearance  Alert, conversant and in no acute stress.  Vascular  Dorsalis pedis and posterior tibial  pulses are weakly  palpable  bilaterally.  Capillary return is within normal limits  bilaterally. Temperature is within normal limits  bilaterally. Purplish feet  B/L.  Neurologic  Senn-Weinstein monofilament wire test within normal limits  bilaterally. Muscle power within normal limits bilaterally.  Nails Thick disfigured discolored nails with subungual debris  from hallux to fifth toes bilaterally. No evidence of bacterial infection or drainage bilaterally.  Orthopedic  No limitations of motion  feet .  No crepitus or effusions noted.  No bony pathology or digital deformities noted.  Skin  normotropic skin with no porokeratosis noted bilaterally.  No signs of infections or ulcers noted.     Onychomycosis  Nails  B/L.  Pain in right toes  Pain in left toes  Debridement of nails both feet followed trimming the nails with dremel tool.    RTC 4  months.   Colter Magowan DPM   

## 2023-07-31 ENCOUNTER — Telehealth: Payer: Self-pay | Admitting: *Deleted

## 2023-07-31 ENCOUNTER — Ambulatory Visit (INDEPENDENT_AMBULATORY_CARE_PROVIDER_SITE_OTHER): Payer: Medicare HMO | Admitting: Gastroenterology

## 2023-07-31 ENCOUNTER — Encounter (INDEPENDENT_AMBULATORY_CARE_PROVIDER_SITE_OTHER): Payer: Self-pay | Admitting: Gastroenterology

## 2023-07-31 VITALS — BP 130/77 | HR 73 | Temp 97.5°F | Ht 62.0 in | Wt 140.0 lb

## 2023-07-31 DIAGNOSIS — R918 Other nonspecific abnormal finding of lung field: Secondary | ICD-10-CM

## 2023-07-31 DIAGNOSIS — K5900 Constipation, unspecified: Secondary | ICD-10-CM

## 2023-07-31 DIAGNOSIS — R590 Localized enlarged lymph nodes: Secondary | ICD-10-CM

## 2023-07-31 DIAGNOSIS — D539 Nutritional anemia, unspecified: Secondary | ICD-10-CM | POA: Diagnosis not present

## 2023-07-31 DIAGNOSIS — R630 Anorexia: Secondary | ICD-10-CM | POA: Insufficient documentation

## 2023-07-31 DIAGNOSIS — K5904 Chronic idiopathic constipation: Secondary | ICD-10-CM | POA: Insufficient documentation

## 2023-07-31 MED ORDER — LINACLOTIDE 145 MCG PO CAPS
145.0000 ug | ORAL_CAPSULE | Freq: Every day | ORAL | 5 refills | Status: DC
Start: 2023-07-31 — End: 2023-09-29

## 2023-07-31 NOTE — Progress Notes (Signed)
Vista Lawman , M.D. Gastroenterology & Hepatology [facility] [address] [place], [place] [address] Primary Care Physician: Corwin Levins, MD [address] [place] [address]  Chief Complaint:  constipation and loss of appetite   History of Present Illness: Susan Davidson is a 72 y.o. 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  Patient is accompanied by her husband in the clinic.  Patient appears to be slow in mentation and appears to have mild cognitive impairment  Patient and husband reports reports that she has diffuse abdominal discomfort, intermittent, dull ache, nonradiating relieved with defecation.  No aggravating factors.  Patient has altered bowel movements where she would have 1  bowel movement every 3 days which is followed by liquid stool many times a day .The patient and husband  denies having any nausea, vomiting, fever, chills, hematochezia, melena, hematemesis,  jaundice, pruritus or weight loss.  Patient was unable to tolerate GoLytely 4 L and is only taking senna at this time. Since stroke her appetite has been very limited.  She eats cereal and eggs sandwich and takes ensure .   Last EGD: None  Last Colonoscopy:None  FHx: neg for any gastrointestinal/liver disease, no malignancies Social: neg smoking, alcohol or illicit drug use  Past Medical History: Past Medical History:  Diagnosis Date   ANXIETY 02/12/2008   Qualifier: Diagnosis of  By: Jonny Ruiz MD, Len Blalock    CVA (cerebral vascular accident) [facility])    DEPRESSION 02/12/2008   Qualifier: Diagnosis of  By: Maris Berger    HYPERLIPIDEMIA 02/12/2008   Qualifier: Diagnosis of  By: Maris Berger    HYPERTENSION 02/12/2008   Qualifier: Diagnosis of  By: Maris Berger    HYPOTHYROIDISM 02/12/2008   Qualifier:  Diagnosis of  By: Maris Berger    Impaired glucose tolerance 08/27/2011   Left hemiparesis (HCC)    OSTEOPENIA 02/12/2008   Qualifier: Diagnosis of  By: Jonny Ruiz MD, Len Blalock    VITAMIN D DEFICIENCY 04/14/2010   Qualifier: Diagnosis of  By: Jonny Ruiz MD, Len Blalock     Past Surgical History: Past Surgical History:  Procedure Laterality Date   IR RADIOLOGIST EVAL & MGMT  08/18/2021    Family History: Family History  Problem Relation Age of Onset   Heart disease Father    Bipolar disorder Sister    Diabetes Neg Hx     Social History: Social History   Tobacco Use  Smoking Status Former   Current packs/day: 0.00   Types: Cigarettes   Quit date: 01/26/2021   Years since quitting: 2.5  Smokeless Tobacco Never   Social History   Substance and Sexual Activity  Alcohol Use Not Currently   Social History   Substance and Sexual Activity  Drug Use No    Allergies: Allergies  Allergen Reactions   Aleve [Naproxen] Nausea Only   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   Epinephrine    Influenza Vac Split Quad     Fatigue, mimics a "super bad flu"    Medications: Current Outpatient Medications  Medication Sig Dispense Refill   acetaminophen (TYLENOL) 325 MG tablet Take 2 tablets (650 mg total) by mouth every  6 (six) hours as needed for mild pain or headache (fever >/= 101). 12 tablet 2   alendronate (FOSAMAX) 70 MG tablet Take 1 tablet (70 mg total) by mouth every 7 (seven) days. Take with a full glass of water on an empty stomach. 12 tablet 3   ALPRAZolam (XANAX) 0.5 MG tablet TAKE 1 TABLET(0.5 MG) BY MOUTH TWICE DAILY AS NEEDED FOR ANXIETY 60 tablet 2   ascorbic acid (VITAMIN C) 500 MG tablet Take 1 tablet (500 mg total) by mouth daily. 30 tablet 2   clopidogrel (PLAVIX) 75 MG tablet Take 1 tablet (75 mg  total) by mouth daily. 90 tablet 3   divalproex (DEPAKOTE) 250 MG DR tablet Take 1 tablet by mouth See admin instructions. 1 tablet in the morning and 2 tablets every evening     lactose free nutrition (BOOST) LIQD Take 237 mLs by mouth 3 (three) times daily between meals. At least one a day and sometimes more     levothyroxine (SYNTHROID) 75 MCG tablet Take 1 tablet (75 mcg total) by mouth daily. 30 tablet 11   melatonin 5 MG TABS Take 5 mg by mouth.     memantine (NAMENDA) 5 MG tablet Take 1 tablet (5 mg total) by mouth 2 (two) times daily. 180 tablet 3   Multiple Vitamin (MULTIVITAMIN) tablet Take 1 tablet by mouth daily.     OVER THE COUNTER MEDICATION Take 1 tablet by mouth daily. magnesium     pantoprazole (PROTONIX) 40 MG tablet Take 1 tablet (40 mg total) by mouth daily. 90 tablet 3   PARoxetine (PAXIL) 40 MG tablet Take 40 mg by mouth every morning.     rosuvastatin (CRESTOR) 5 MG tablet TAKE 1 TABLET BY MOUTH DAILY 90 tablet 3   senna-docusate (SENOKOT-S) 8.6-50 MG tablet Take 2 tablets by mouth at bedtime. 180 tablet 3   albuterol (VENTOLIN HFA) 108 (90 Base) MCG/ACT inhaler Inhale 2 puffs into the lungs every 6 (six) hours as needed for wheezing or shortness of breath. (Patient not taking: Reported on 07/31/2023) 17 each 2   No current facility-administered medications for this visit.    Review of Systems: GENERAL: negative for malaise, night sweats HEENT: No changes in hearing or vision, no nose bleeds or other nasal problems. NECK: Negative for lumps, goiter, pain and significant neck swelling RESPIRATORY: Negative for cough, wheezing CARDIOVASCULAR: Negative for chest pain, leg swelling, palpitations, orthopnea GI: SEE HPI MUSCULOSKELETAL: Negative for joint pain or swelling, back pain, and muscle pain. SKIN: Negative for lesions, rash HEMATOLOGY Negative for prolonged bleeding, bruising easily, and swollen nodes. ENDOCRINE: Negative for cold or heat intolerance, polyuria,  polydipsia and goiter. NEURO: negative for tremor, gait imbalance, syncope and seizures. The remainder of the review of systems is noncontributory.   Physical Exam: BP 130/77 (BP Location: Right Arm, Patient Position: Sitting, Cuff Size: Normal)   Pulse 73   Temp (!) 97.5 F (36.4 C) (Oral)   Ht 5\' 2"  (1.575 m)   Wt 140 lb (63.5 kg) Comment: per pt's husband.  BMI 25.61 kg/m  GENERAL: The patient is AO x2, Wheelchair bound HEENT: Head is normocephalic and atraumatic. EOMI are intact. Mouth is well hydrated and without lesions. NECK: Supple. No masses LUNGS: Clear to auscultation. No presence of rhonchi/wheezing/rales. Adequate chest expansion HEART: RRR, normal s1 and s2. ABDOMEN: Soft, nontender, no guarding, no peritoneal signs, and nondistended. BS +. No masses. EXTREMITIES: Without any cyanosis, clubbing, rash, lesions or edema.   Imaging/Labs: as above  I personally reviewed and interpreted the available labs, imaging and endoscopic files.  CT CAP  Motion artifact.   No bowel obstruction, free air or free fluid. Scattered stool. Normal appendix.   Diffuse atherosclerotic changes. 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 Please correlate with any particular symptoms.   Elevated right hemidiaphragm.    HBG: 16 -->11  Abdomen xray  IMPRESSION: Large amount of stool throughout the colon.  Impression and Plan:  Susan Davidson is a 72 y.o. 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  #Constipation   Patient is wheelchair-bound, xray with large stool burden .Also patient has underlying hypothyroidism Benign abdominal exam,, and no dysphagia, hematochezia  Patient was given bowel purge given large stool burden but was not able to tolerate large amount of fluids(GoLytely 4 L) and is only taking senna for constipation with  inadequate response  At this time we will prescribe Linzess 145 mcg daily as patient is unable to tolerate MiraLAX and Metamucil and continues to be constipated  #Loss of appetite   As per husband since stroke patient has been deconditioning and now had loss of appetite.  Imaging from 05/17/2023 CT chest abdomen pelvis with contrast without any malignancy, but showed bilateral lung opacities and large subcarinal lymph node  Labs from 05/18/2023 appears to have new macrocytic anemia with hemoglobin 11.8 MCV 104  Discussed with patient husband that loss of appetite and macrocytic anemia are alarm symptom in a patient with age 9.  Without upper endoscopy I cannot rule out malignancy  Discussed risk benefit limitation and alternatives to upper endoscopy.  Patient has been would like to defer upper endoscopy at this time given patient age and comorbidities.  I reiterated to the patient husband that without upper endoscopy I cannot rule out malignancy  They would like to control constipation and reassess upper endoscopy at next clinic visit.    #Macrocytic Anemia  Patient with hemoglobin 11.8 MCV 104  Will differentiate the etiology of anemia with vitamin B12 folate and iron profile   #HCM Patient with advanced age, stroke wheelchair-bound.  Discussed indication risk benefit of screening colonoscopy.  Patient appeared to have mild cognitive impairment and is slowing mentation.  Decision is made by the husband to forego any screening colonoscopy at this time given patient age and comorbid conditions   All questions were answered.      Vista Lawman, MD Gastroenterology and Hepatology [facility]

## 2023-07-31 NOTE — Patient Instructions (Signed)
It was very nice to meet you today, as dicussed with will plan for the following :  1) Linzess works best when taken once a day every day, on an empty stomach, at least 30 minutes before your first meal of the day.  When Linzess is taken daily as directed:  *Constipation relief is typically felt in about a week *IBS-C patients may begin to experience relief from belly pain and overall abdominal symptoms (pain, discomfort, and bloating) in about 1 week,   with symptoms typically improving over 12 weeks.  Diarrhea, nausea or abdominal cramping may occur in the first 2 weeks -keep taking it.  These symptoms should eventually resolve. Please notify us if having more than 4 watery bowel movements per day or fecal soiling accidents.

## 2023-07-31 NOTE — Telephone Encounter (Signed)
Called the patient, spoke with her husband, Loraine Leriche. I shared her results and that the Levothyroxine 75 mcg was the correct dose. Patient is to repeat labs in 3 months , and those orders have already been put in, our front staff will contact the patient to make another appointment.

## 2023-08-02 ENCOUNTER — Inpatient Hospital Stay (HOSPITAL_COMMUNITY)
Admission: EM | Admit: 2023-08-02 | Discharge: 2023-08-05 | DRG: 280 | Disposition: A | Discharge status: Home Health | Payer: Medicare HMO | Attending: Family Medicine | Admitting: Family Medicine

## 2023-08-02 ENCOUNTER — Emergency Department (HOSPITAL_COMMUNITY): Payer: Medicare HMO

## 2023-08-02 ENCOUNTER — Other Ambulatory Visit: Payer: Self-pay

## 2023-08-02 ENCOUNTER — Encounter (HOSPITAL_COMMUNITY): Payer: Self-pay | Admitting: Emergency Medicine

## 2023-08-02 DIAGNOSIS — I129 Hypertensive chronic kidney disease with stage 1 through stage 4 chronic kidney disease, or unspecified chronic kidney disease: Secondary | ICD-10-CM | POA: Diagnosis present

## 2023-08-02 DIAGNOSIS — Y710 Diagnostic and monitoring cardiovascular devices associated with adverse incidents: Secondary | ICD-10-CM | POA: Diagnosis not present

## 2023-08-02 DIAGNOSIS — Z87891 Personal history of nicotine dependence: Secondary | ICD-10-CM

## 2023-08-02 DIAGNOSIS — Z8249 Family history of ischemic heart disease and other diseases of the circulatory system: Secondary | ICD-10-CM

## 2023-08-02 DIAGNOSIS — Z8673 Personal history of transient ischemic attack (TIA), and cerebral infarction without residual deficits: Secondary | ICD-10-CM | POA: Diagnosis not present

## 2023-08-02 DIAGNOSIS — I671 Cerebral aneurysm, nonruptured: Secondary | ICD-10-CM | POA: Diagnosis present

## 2023-08-02 DIAGNOSIS — Z515 Encounter for palliative care: Secondary | ICD-10-CM | POA: Diagnosis not present

## 2023-08-02 DIAGNOSIS — Y840 Cardiac catheterization as the cause of abnormal reaction of the patient, or of later complication, without mention of misadventure at the time of the procedure: Secondary | ICD-10-CM | POA: Diagnosis not present

## 2023-08-02 DIAGNOSIS — Z7401 Bed confinement status: Secondary | ICD-10-CM | POA: Diagnosis not present

## 2023-08-02 DIAGNOSIS — Z7982 Long term (current) use of aspirin: Secondary | ICD-10-CM

## 2023-08-02 DIAGNOSIS — I97630 Postprocedural hematoma of a circulatory system organ or structure following a cardiac catheterization: Secondary | ICD-10-CM | POA: Diagnosis not present

## 2023-08-02 DIAGNOSIS — Z887 Allergy status to serum and vaccine status: Secondary | ICD-10-CM

## 2023-08-02 DIAGNOSIS — Z7983 Long term (current) use of bisphosphonates: Secondary | ICD-10-CM

## 2023-08-02 DIAGNOSIS — G928 Other toxic encephalopathy: Secondary | ICD-10-CM | POA: Diagnosis not present

## 2023-08-02 DIAGNOSIS — I639 Cerebral infarction, unspecified: Secondary | ICD-10-CM | POA: Diagnosis not present

## 2023-08-02 DIAGNOSIS — Z886 Allergy status to analgesic agent status: Secondary | ICD-10-CM

## 2023-08-02 DIAGNOSIS — I69391 Dysphagia following cerebral infarction: Secondary | ICD-10-CM | POA: Diagnosis not present

## 2023-08-02 DIAGNOSIS — Z79890 Hormone replacement therapy (postmenopausal): Secondary | ICD-10-CM

## 2023-08-02 DIAGNOSIS — F0393 Unspecified dementia, unspecified severity, with mood disturbance: Secondary | ICD-10-CM | POA: Diagnosis present

## 2023-08-02 DIAGNOSIS — R718 Other abnormality of red blood cells: Secondary | ICD-10-CM | POA: Diagnosis not present

## 2023-08-02 DIAGNOSIS — R0989 Other specified symptoms and signs involving the circulatory and respiratory systems: Secondary | ICD-10-CM | POA: Diagnosis not present

## 2023-08-02 DIAGNOSIS — M8580 Other specified disorders of bone density and structure, unspecified site: Secondary | ICD-10-CM | POA: Diagnosis present

## 2023-08-02 DIAGNOSIS — T424X5A Adverse effect of benzodiazepines, initial encounter: Secondary | ICD-10-CM | POA: Diagnosis present

## 2023-08-02 DIAGNOSIS — T1490XA Injury, unspecified, initial encounter: Secondary | ICD-10-CM | POA: Diagnosis not present

## 2023-08-02 DIAGNOSIS — N1831 Chronic kidney disease, stage 3a: Secondary | ICD-10-CM | POA: Diagnosis present

## 2023-08-02 DIAGNOSIS — F0390 Unspecified dementia without behavioral disturbance: Secondary | ICD-10-CM | POA: Diagnosis not present

## 2023-08-02 DIAGNOSIS — Z87898 Personal history of other specified conditions: Secondary | ICD-10-CM | POA: Diagnosis not present

## 2023-08-02 DIAGNOSIS — E785 Hyperlipidemia, unspecified: Secondary | ICD-10-CM | POA: Diagnosis present

## 2023-08-02 DIAGNOSIS — I69354 Hemiplegia and hemiparesis following cerebral infarction affecting left non-dominant side: Secondary | ICD-10-CM

## 2023-08-02 DIAGNOSIS — Z79899 Other long term (current) drug therapy: Secondary | ICD-10-CM | POA: Diagnosis not present

## 2023-08-02 DIAGNOSIS — Z818 Family history of other mental and behavioral disorders: Secondary | ICD-10-CM | POA: Diagnosis not present

## 2023-08-02 DIAGNOSIS — F419 Anxiety disorder, unspecified: Secondary | ICD-10-CM | POA: Diagnosis present

## 2023-08-02 DIAGNOSIS — F32A Depression, unspecified: Secondary | ICD-10-CM | POA: Diagnosis present

## 2023-08-02 DIAGNOSIS — I214 Non-ST elevation (NSTEMI) myocardial infarction: Principal | ICD-10-CM | POA: Diagnosis present

## 2023-08-02 DIAGNOSIS — G894 Chronic pain syndrome: Secondary | ICD-10-CM | POA: Diagnosis present

## 2023-08-02 DIAGNOSIS — Z7902 Long term (current) use of antithrombotics/antiplatelets: Secondary | ICD-10-CM | POA: Diagnosis not present

## 2023-08-02 DIAGNOSIS — I2510 Atherosclerotic heart disease of native coronary artery without angina pectoris: Secondary | ICD-10-CM | POA: Diagnosis present

## 2023-08-02 DIAGNOSIS — R079 Chest pain, unspecified: Secondary | ICD-10-CM | POA: Diagnosis not present

## 2023-08-02 DIAGNOSIS — R531 Weakness: Secondary | ICD-10-CM | POA: Diagnosis not present

## 2023-08-02 DIAGNOSIS — E039 Hypothyroidism, unspecified: Secondary | ICD-10-CM | POA: Diagnosis present

## 2023-08-02 DIAGNOSIS — I3139 Other pericardial effusion (noninflammatory): Secondary | ICD-10-CM | POA: Diagnosis present

## 2023-08-02 DIAGNOSIS — I252 Old myocardial infarction: Secondary | ICD-10-CM

## 2023-08-02 LAB — CBC WITH DIFFERENTIAL/PLATELET
Abs Immature Granulocytes: 0.03 10*3/uL (ref 0.00–0.07)
Basophils Absolute: 0 10*3/uL (ref 0.0–0.1)
Basophils Relative: 0 %
Eosinophils Absolute: 0.2 10*3/uL (ref 0.0–0.5)
Eosinophils Relative: 2 %
HCT: 46.3 % — ABNORMAL HIGH (ref 36.0–46.0)
Hemoglobin: 14.7 g/dL (ref 12.0–15.0)
Immature Granulocytes: 0 %
Lymphocytes Relative: 19 %
Lymphs Abs: 1.6 10*3/uL (ref 0.7–4.0)
MCH: 34.1 pg — ABNORMAL HIGH (ref 26.0–34.0)
MCHC: 31.7 g/dL (ref 30.0–36.0)
MCV: 107.4 fL — ABNORMAL HIGH (ref 80.0–100.0)
Monocytes Absolute: 0.6 10*3/uL (ref 0.1–1.0)
Monocytes Relative: 7 %
Neutro Abs: 5.9 10*3/uL (ref 1.7–7.7)
Neutrophils Relative %: 72 %
RBC: 4.31 MIL/uL (ref 3.87–5.11)
RDW: 15.8 % — ABNORMAL HIGH (ref 11.5–15.5)
WBC: 8.3 10*3/uL (ref 4.0–10.5)
nRBC: 0 % (ref 0.0–0.2)

## 2023-08-02 LAB — BASIC METABOLIC PANEL
Anion gap: 11 (ref 5–15)
BUN: 19 mg/dL (ref 8–23)
CO2: 27 mmol/L (ref 22–32)
Calcium: 8.8 mg/dL — ABNORMAL LOW (ref 8.9–10.3)
Chloride: 106 mmol/L (ref 98–111)
Creatinine, Ser: 0.76 mg/dL (ref 0.44–1.00)
GFR, Estimated: >60 mL/min (ref >60)
Glucose, Bld: 120 mg/dL — ABNORMAL HIGH (ref 70–99)
Potassium: 4 mmol/L (ref 3.5–5.1)
Sodium: 144 mmol/L (ref 135–145)

## 2023-08-02 LAB — TROPONIN I (HIGH SENSITIVITY)
Troponin I (High Sensitivity): 2035 ng/L (ref <18)
Troponin I (High Sensitivity): 2056 ng/L (ref <18)

## 2023-08-02 LAB — PROTIME-INR
INR: 0.9 (ref 0.8–1.2)
Prothrombin Time: 12 s (ref 11.4–15.2)

## 2023-08-02 LAB — APTT: aPTT: 24 s (ref 24–36)

## 2023-08-02 MED ORDER — HEPARIN BOLUS VIA INFUSION
3800.0000 [IU] | Freq: Once | INTRAVENOUS | Status: AC
  Administered 2023-08-02: 3800 [IU] via INTRAVENOUS

## 2023-08-02 MED ORDER — ACETAMINOPHEN 650 MG RE SUPP
650.0000 mg | Freq: Four times a day (QID) | RECTAL | Status: DC | PRN
Start: 2023-08-02 — End: 2023-08-03

## 2023-08-02 MED ORDER — ONDANSETRON HCL 4 MG/2ML IJ SOLN: 4.0000 mg | Freq: Four times a day (QID) | INTRAMUSCULAR | Status: DC | PRN

## 2023-08-02 MED ORDER — ACETAMINOPHEN 325 MG PO TABS
650.0000 mg | ORAL_TABLET | Freq: Four times a day (QID) | ORAL | Status: DC | PRN
  Administered 2023-08-03: 650 mg via ORAL
  Filled 2023-08-02 (×2): qty 2

## 2023-08-02 MED ORDER — ONDANSETRON HCL 4 MG PO TABS: 4.0000 mg | ORAL_TABLET | Freq: Four times a day (QID) | ORAL | Status: DC | PRN

## 2023-08-02 MED ORDER — HEPARIN (PORCINE) 25000 UT/250ML-% IV SOLN
800.0000 [IU]/h | INTRAVENOUS | Status: DC
  Administered 2023-08-02: 800 [IU]/h via INTRAVENOUS
  Filled 2023-08-02: qty 250

## 2023-08-02 NOTE — Progress Notes (Signed)
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   Epinephrine    Influenza Vac Split Quad     Fatigue, mimics a "super bad flu"    Patient Measurements: Height: 5\' 2"  (157.5 cm) Weight: 63 kg (138 lb 14.2 oz) IBW/kg (Calculated) : 50.1 Heparin Dosing Weight: 63 kg   Vital Signs: Temp: 99.4 F (37.4 C) (10/30 1641) Temp Source: Oral (10/30 1641) BP: 142/68 (10/30 1900) Pulse Rate: 74 (10/30 1900)  Labs: Recent Labs    08/02/23 1805  HGB 14.7  HCT 46.3*  PLT ACLMP  CREATININE 0.76  TROPONINIHS 2,035*    Estimated Creatinine Clearance: 53.9 mL/min (by C-G formula based on SCr of 0.76 mg/dL).   Medical History: Past Medical History:  Diagnosis Date   ANXIETY 02/12/2008   Qualifier: Diagnosis of  By: Jonny Ruiz MD, Len Blalock    CVA (cerebral vascular accident) [facility])    DEPRESSION 02/12/2008   Qualifier: Diagnosis of  By: Maris Berger    HYPERLIPIDEMIA 02/12/2008   Qualifier: Diagnosis of  By: Maris Berger    HYPERTENSION 02/12/2008   Qualifier: Diagnosis of  By: Maris Berger    HYPOTHYROIDISM 02/12/2008   Qualifier: Diagnosis of  By: Maris Berger    Impaired glucose tolerance 08/27/2011   Left hemiparesis (HCC)    OSTEOPENIA 02/12/2008   Qualifier: Diagnosis of  By: Jonny Ruiz MD, Len Blalock    VITAMIN D DEFICIENCY 04/14/2010   Qualifier: Diagnosis of  By: Jonny Ruiz MD, Len Blalock     Medications:  (Not in a hospital admission)  Scheduled:   heparin  3,800 Units Intravenous Once   Infusions:   heparin      Assessment: 72 yo F to start heparin drip for ACS/STEMI with chest pain, elevated troponin. Prior hx of  stroke, CKD, HTN, HLD On Plavix PTA per med rec and review of dispense hx. Baseline:  hgb 14.7  Plt-appear adequate per note on lab    INR pending   aPTT pending   Goal of Therapy:  Heparin level 0.3-0.7 units/ml Monitor platelets by anticoagulation protocol: Yes   Plan:  Give 3800 units bolus x 1 Start heparin infusion at 800 units/hr Check anti-Xa level in 8 hours and daily while on heparin Continue to monitor H&H and platelets  Trudee Chirino A 08/02/2023,7:35 PM

## 2023-08-02 NOTE — ED Triage Notes (Signed)
Pt had dull pain to chest that lasted 5 minutes, pain has resolved but she still wanted to be seen.

## 2023-08-02 NOTE — ED Notes (Signed)
ED TO INPATIENT HANDOFF REPORT  ED Nurse Name and Phone #:  Melanee Spry [phone]  S Name/Age/Gender Susan Davidson 72 y.o. female Room/Bed: APA11/APA11  Code Status   Code Status: Prior  Home/SNF/Other Home Patient oriented to: self, place, time, and situation Is this baseline? Yes   Triage Complete: Triage complete  Chief Complaint NSTEMI (non-ST elevated myocardial infarction) [facility]) [I21.4]  Triage Note Pt had dull pain to chest that lasted 5 minutes, pain has resolved but she still wanted to be seen.    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   Influenza Vac Split Quad     Fatigue, mimics a "super bad flu"    Level of Care/Admitting Diagnosis ED Disposition     ED Disposition  Admit   Condition  --   Comment  Hospital Area: [facility] [[id number]]  Level of Care: Telemetry Cardiac [[id number]]  May admit patient to [facility] or [facility] if equivalent level of care is available:: Yes  Covid Evaluation: Asymptomatic - no recent exposure (last 10 days) testing not required  Diagnosis: NSTEMI (non-ST elevated myocardial infarction) [facility]) [401027]  Admitting Physician: Frankey Shown [[id number]]  Attending Physician: Frankey Shown [[id number]]  Certification:: I certify this patient will need inpatient services for at least 2 midnights  Expected Medical Readiness: 08/05/2023          B Medical/Surgery History Past Medical History:  Diagnosis Date   ANXIETY 02/12/2008   Qualifier: Diagnosis of  By: Jonny Ruiz MD, Len Blalock    CVA (cerebral vascular accident) [facility])    DEPRESSION 02/12/2008   Qualifier: Diagnosis of  By: Maris Berger    HYPERLIPIDEMIA 02/12/2008   Qualifier: Diagnosis of  By:  Maris Berger    HYPERTENSION 02/12/2008   Qualifier: Diagnosis of  By: Maris Berger    HYPOTHYROIDISM 02/12/2008   Qualifier: Diagnosis of  By: Maris Berger    Impaired glucose tolerance 08/27/2011   Left hemiparesis (HCC)    OSTEOPENIA 02/12/2008   Qualifier: Diagnosis of  By: Jonny Ruiz MD, Len Blalock    VITAMIN D DEFICIENCY 04/14/2010   Qualifier: Diagnosis of  By: Jonny Ruiz MD, Len Blalock    Past Surgical History:  Procedure Laterality Date   IR RADIOLOGIST EVAL & MGMT  08/18/2021     A IV Location/Drains/Wounds Patient Lines/Drains/Airways Status     Active Line/Drains/Airways     Name Placement date Placement time Site Days   Peripheral IV 08/02/23 20 G Posterior;Right;Distal Forearm 08/02/23  1958  Forearm  less than 1   Pressure Injury 01/25/22 Heel Right Stage 1 -  Intact skin with non-blanchable redness of a localized area usually over a bony prominence. 3cm x 4cm - area boggy to touch - foam dressing in place - heels floated to relieve pressure 01/25/22  0800  -- 554            Intake/Output Last 24 hours No intake or output data in the 24 hours ending 08/02/23 2010  Labs/Imaging Results for orders placed or performed during the hospital encounter of 08/02/23 (from the past 48 hour(s))  Basic metabolic panel     Status: Abnormal   Collection Time: 08/02/23  6:05 PM  Result Value Ref Range  Sodium 144 135 - 145 mmol/L   Potassium 4.0 3.5 - 5.1 mmol/L   Chloride 106 98 - 111 mmol/L   CO2 27 22 - 32 mmol/L   Glucose, Bld 120 (H) 70 - 99 mg/dL    Comment: Glucose reference range applies only to samples taken after fasting for at least 8 hours.   BUN 19 8 - 23 mg/dL   Creatinine, Ser 4.16 0.44 - 1.00 mg/dL   Calcium 8.8 (L) 8.9 - 10.3 mg/dL   GFR, Estimated >60 >63 mL/min    Comment: (NOTE) Calculated using the CKD-EPI Creatinine Equation (2021)    Anion gap 11 5 - 15    Comment: Performed at [facility], [address] [place],  [place] [address]  CBC with Differential     Status: Abnormal   Collection Time: 08/02/23  6:05 PM  Result Value Ref Range   WBC 8.3 4.0 - 10.5 K/uL   RBC 4.31 3.87 - 5.11 MIL/uL   Hemoglobin 14.7 12.0 - 15.0 g/dL   HCT 09.3 (H) 23.5 - 57.3 %   MCV 107.4 (H) 80.0 - 100.0 fL   MCH 34.1 (H) 26.0 - 34.0 pg   MCHC 31.7 30.0 - 36.0 g/dL   RDW 22.0 (H) 25.4 - 27.0 %   Platelets ACLMP 150 - 400 K/uL    Comment: SPECIMEN CHECKED FOR CLOTS REPEATED TO VERIFY PLATELETS APPEAR ADEQUATE    nRBC 0.0 0.0 - 0.2 %   Neutrophils Relative % 72 %   Neutro Abs 5.9 1.7 - 7.7 K/uL   Lymphocytes Relative 19 %   Lymphs Abs 1.6 0.7 - 4.0 K/uL   Monocytes Relative 7 %   Monocytes Absolute 0.6 0.1 - 1.0 K/uL   Eosinophils Relative 2 %   Eosinophils Absolute 0.2 0.0 - 0.5 K/uL   Basophils Relative 0 %   Basophils Absolute 0.0 0.0 - 0.1 K/uL   WBC Morphology MORPHOLOGY UNREMARKABLE    RBC Morphology MORPHOLOGY UNREMARKABLE    Immature Granulocytes 0 %   Abs Immature Granulocytes 0.03 0.00 - 0.07 K/uL    Comment: Performed at [facility], [address], [place], [place] [address]  Troponin I (High Sensitivity)     Status: Abnormal   Collection Time: 08/02/23  6:05 PM  Result Value Ref Range   Troponin I (High Sensitivity) 2,035 (HH) <18 ng/L    Comment: CRITICAL RESULT CALLED TO, READ BACK BY AND VERIFIED WITH BELTON,C ON 08/02/23 AT 1920 BY LOY,C (NOTE) Elevated high sensitivity troponin I (hsTnI) values and significant  changes across serial measurements may suggest ACS but many other  chronic and acute conditions are known to elevate hsTnI results.  Refer to the "Links" section for chest pain algorithms and additional  guidance. Performed at [facility], [address] [place], [place] [address]    DG Chest Bryant 1 View  Result Date: 08/02/2023 CLINICAL DATA:  Former smoker.  Dull chest pain EXAM: PORTABLE CHEST 1 VIEW COMPARISON:  None Available. FINDINGS: Normal mediastinum and cardiac silhouette.  Low lung volumes. Normal pulmonary vasculature. No evidence of effusion, infiltrate, or pneumothorax. No acute bony abnormality. IMPRESSION: No active disease. Electronically Signed   By: Genevive Bi M.D.   On: 08/02/2023 18:51    Pending Labs Unresulted Labs (From admission, onward)     Start     Ordered   08/03/23 0500  CBC  Tomorrow morning,   R        08/02/23 1934   08/02/23  1932  APTT  ONCE - STAT,   STAT        08/02/23 1934   08/02/23 1932  Protime-INR  ONCE - STAT,   STAT       Comments: To start heparin drip    08/02/23 1934            Vitals/Pain Today's Vitals   08/02/23 1639 08/02/23 1641 08/02/23 1900  BP:  (!) 157/79 (!) 142/68  Pulse:  74 74  Resp:  16 14  Temp:  99.4 F (37.4 C)   TempSrc:  Oral   SpO2:  94% 93%  Weight: 63 kg    Height: 5\' 2"  (1.575 m)    PainSc: 0-No pain      Isolation Precautions No active isolations  Medications Medications  heparin ADULT infusion 100 units/mL (25000 units/283mL) (800 Units/hr Intravenous New Bag/Given 08/02/23 1954)  heparin bolus via infusion 3,800 Units (3,800 Units Intravenous Bolus from Bag 08/02/23 1955)    Mobility non-ambulatory, assistive devices.      Focused Assessments Cardiac Assessment Handoff:  Cardiac Rhythm: Normal sinus rhythm Lab Results  Component Value Date   CKTOTAL 37 (L) 05/17/2023   Lab Results  Component Value Date   DDIMER 1.09 (H) 08/04/2021   Does the Patient currently have chest pain? No    R Recommendations: See Admitting Provider Note  Report given to:   Additional Notes: N/A

## 2023-08-02 NOTE — H&P (Signed)
History and Physical    Patient: Susan Davidson QIO:[id number] DOB: January 12, 1951 DOA: 08/02/2023 DOS: the patient was seen and examined on 08/03/2023 PCP: Corwin Levins, MD  Patient coming from: Home  Chief Complaint: No chief complaint on file.  HPI: Susan Davidson is a 72 y.o. female with medical history significant of hypothyroidism, hyperlipidemia, GERD, depression, history of CVA and nonambulatory at baseline who presents to the emergency department from home via EMS due to chest pain sustained at home.  Patient complained of an episode of chest pain which was described as burning sensation in midsternal area and lasted about 5 minutes.  Patient's nurse aide was with her when she had the chest pain, so she activated EMS and patient was sent to the ED for further evaluation and management.  She denies shortness of breath, nausea, vomiting, diaphoresis.  Patient was admitted from 9/14 to 8/16 due to acute respiratory failure with hypoxia secondary to community-acquired pneumonia which was treated with IV ceftriaxone and azithromycin and bronchodilators and was discharged home on p.o. Augmentin, albuterol inhaler as needed and Mucinex as needed.  She also presented at a time with acute metabolic encephalopathy that was due to benzos.  ED Course:  In the emergency department, BP was 157/79, other vital signs were within normal range.  Workup in the ED was normal except for hematocrit of 46.3, MCV 107.4.  BMP was normal except for blood glucose of 120.  Troponin x 2 - 2,035 > 2,056. Chest x-ray showed no active disease cardiologist on-call at [facility] was consulted and recommended admitting patient to [facility] with plan for cardiology team to consult on patient in the morning. Patient was started on IV heparin drip.  Hospitalist was asked to admit patient for further evaluation and management.   Review of Systems: Review of systems as noted in the HPI. All other systems reviewed and are  negative.   Past Medical History:  Diagnosis Date   ANXIETY 02/12/2008   Qualifier: Diagnosis of  By: Jonny Ruiz MD, Len Blalock    CVA (cerebral vascular accident) [facility])    DEPRESSION 02/12/2008   Qualifier: Diagnosis of  By: Maris Berger    HYPERLIPIDEMIA 02/12/2008   Qualifier: Diagnosis of  By: Maris Berger    HYPERTENSION 02/12/2008   Qualifier: Diagnosis of  By: Maris Berger    HYPOTHYROIDISM 02/12/2008   Qualifier: Diagnosis of  By: Maris Berger    Impaired glucose tolerance 08/27/2011   Left hemiparesis (HCC)    OSTEOPENIA 02/12/2008   Qualifier: Diagnosis of  By: Jonny Ruiz MD, Len Blalock    VITAMIN D DEFICIENCY 04/14/2010   Qualifier: Diagnosis of  By: Jonny Ruiz MD, Len Blalock    Past Surgical History:  Procedure Laterality Date   IR RADIOLOGIST EVAL & MGMT  08/18/2021    Social History:  reports that she quit smoking about 2 years ago. Her smoking use included cigarettes. She has never used smokeless tobacco. She reports that she does not currently use alcohol. She reports that she does not use drugs.   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   Influenza Vac Split Quad     Fatigue, mimics a "super bad flu"    Family History  Problem Relation Age of  Onset   Heart disease Father    Bipolar disorder Sister    Diabetes Neg Hx      Prior to Admission medications   Medication Sig Start Date End Date Taking? Authorizing Provider  acetaminophen (TYLENOL) 325 MG tablet Take 2 tablets (650 mg total) by mouth every 6 (six) hours as needed for mild pain or headache (fever >/= 101). 08/04/21  Yes Emokpae, Courage, MD  albuterol (VENTOLIN HFA) 108 (90 Base) MCG/ACT inhaler Inhale 2 puffs into the lungs every 6 (six) hours as  needed for wheezing or shortness of breath. 05/19/23  Yes Emokpae, Courage, MD  alendronate (FOSAMAX) 70 MG tablet Take 1 tablet (70 mg total) by mouth every 7 (seven) days. Take with a full glass of water on an empty stomach. 05/12/23  Yes Corwin Levins, MD  ALPRAZolam Prudy Feeler) 0.5 MG tablet TAKE 1 TABLET(0.5 MG) BY MOUTH TWICE DAILY AS NEEDED FOR ANXIETY Patient taking differently: Take 0.5 mg by mouth 2 (two) times daily. 05/31/23  Yes Corwin Levins, MD  ascorbic acid (VITAMIN C) 500 MG tablet Take 1 tablet (500 mg total) by mouth daily. Patient taking differently: Take 500 mg by mouth at bedtime. 08/05/21  Yes Emokpae, Courage, MD  Cholecalciferol (VITAMIN D-3 PO) Take 1 capsule by mouth at bedtime.   Yes [provider]  clopidogrel (PLAVIX) 75 MG tablet Take 1 tablet (75 mg total) by mouth daily. 06/08/23  Yes Corwin Levins, MD  divalproex (DEPAKOTE) 250 MG DR tablet Take 1 tablet by mouth See admin instructions. 1 tablet in the morning and 2 tablets every evening 07/28/21  Yes [provider]  folic acid (FOLVITE) 800 MCG tablet Take 400 mcg by mouth at bedtime.   Yes [provider]  ibuprofen (ADVIL) 200 MG tablet Take 200 mg by mouth every 6 (six) hours as needed for mild pain (pain score 1-3).   Yes [provider]  lactose free nutrition (BOOST) LIQD Take 237 mLs by mouth 2 (two) times daily between meals. At least one a day and sometimes more   Yes [provider]  levothyroxine (SYNTHROID) 75 MCG tablet Take 1 tablet (75 mcg total) by mouth daily. 05/19/23 05/18/24 Yes Emokpae, Courage, MD  linaclotide (LINZESS) 145 MCG CAPS capsule Take 1 capsule (145 mcg total) by mouth daily. 07/31/23  Yes Ahmed, [name], MD  melatonin 5 MG TABS Take 5 mg by mouth at bedtime.   Yes [provider]  memantine (NAMENDA) 5 MG tablet Take 1 tablet (5 mg total) by mouth 2 (two) times daily. 06/08/23  Yes Corwin Levins, MD  Multiple Vitamin (MULTIVITAMIN) tablet  Take 1 tablet by mouth at bedtime.   Yes [provider]  OVER THE COUNTER MEDICATION Take 1 tablet by mouth at bedtime. magnesium   Yes [provider]  pantoprazole (PROTONIX) 40 MG tablet Take 1 tablet (40 mg total) by mouth daily. 06/08/23  Yes Corwin Levins, MD  PARoxetine (PAXIL) 40 MG tablet Take 40 mg by mouth every morning. 01/01/22  Yes [provider]  rosuvastatin (CRESTOR) 5 MG tablet TAKE 1 TABLET BY MOUTH DAILY Patient taking differently: Take 5 mg by mouth at bedtime. TAKE 1 TABLET BY MOUTH DAILY 06/08/23  Yes Corwin Levins, MD    Physical Exam: BP (!) 153/92 (BP Location: Right Arm)   Pulse 73   Temp 99.5 F (37.5 C) (Oral)   Resp 16   Ht 5\' 2"  (1.575 m)  Wt 63 kg   SpO2 96%   BMI 25.40 kg/m   General: 72 y.o. year-old female well developed well nourished in no acute distress.  Alert and oriented x3. HEENT: NCAT, EOMI Neck: Supple, trachea medial Cardiovascular: Regular rate and rhythm with no rubs or gallops.  No thyromegaly or JVD noted.  No lower extremity edema. 2/4 pulses in all 4 extremities. Respiratory: Clear to auscultation with no wheezes or rales. Good inspiratory effort. Abdomen: Soft, nontender nondistended with normal bowel sounds x4 quadrants. Muskuloskeletal: No cyanosis, clubbing or edema noted bilaterally Neuro: CN II-XII intact, strength 5/5 x 4, sensation, reflexes intact Skin: No ulcerative lesions noted or rashes Psychiatry: Judgement and insight appear normal. Mood is appropriate for condition and setting          Labs on Admission:  Basic Metabolic Panel: Recent Labs  Lab 08/02/23 1805  NA 144  K 4.0  CL 106  CO2 27  GLUCOSE 120*  BUN 19  CREATININE 0.76  CALCIUM 8.8*   Liver Function Tests: No results for input(s): "AST", "ALT", "ALKPHOS", "BILITOT", "PROT", "ALBUMIN" in the last 168 hours. No results for input(s): "LIPASE", "AMYLASE" in the last 168 hours. No results for input(s): "AMMONIA" in the last  168 hours. CBC: Recent Labs  Lab 08/02/23 1805  WBC 8.3  NEUTROABS 5.9  HGB 14.7  HCT 46.3*  MCV 107.4*  PLT ACLMP   Cardiac Enzymes: No results for input(s): "CKTOTAL", "CKMB", "CKMBINDEX", "TROPONINI" in the last 168 hours.  BNP (last 3 results) No results for input(s): "BNP" in the last 8760 hours.  ProBNP (last 3 results) No results for input(s): "PROBNP" in the last 8760 hours.  CBG: No results for input(s): "GLUCAP" in the last 168 hours.  Radiological Exams on Admission: DG Chest Port 1 View  Result Date: 08/02/2023 CLINICAL DATA:  Former smoker.  Dull chest pain EXAM: PORTABLE CHEST 1 VIEW COMPARISON:  None Available. FINDINGS: Normal mediastinum and cardiac silhouette. Low lung volumes. Normal pulmonary vasculature. No evidence of effusion, infiltrate, or pneumothorax. No acute bony abnormality. IMPRESSION: No active disease. Electronically Signed   By: Genevive Bi M.D.   On: 08/02/2023 18:51    EKG: I independently viewed the EKG done and my findings are as followed: Normal sinus rhythm at a rate of 76 bpm  Assessment/Plan Present on Admission:  NSTEMI (non-ST elevated myocardial infarction) [facility])  Acquired hypothyroidism  Principal Problem:   NSTEMI (non-ST elevated myocardial infarction) (HCC) Active Problems:   Acquired hypothyroidism   History of stroke   Dementia without behavioral disturbance (HCC)   History of dysphagia   Elevated MCV  NSTEMI Patient presented with chest pain with burning sensation for about 5 minutes and it has since self resolved Troponin - 2,035 > 2,056 Cardiologist on-call at [facility] was consulted and recommended admitting patient to [facility] with plan for cardiology team to follow-up on patient in the morning. Continue heparin drip Place patient n.p.o. at midnight in anticipation for possible intervention in the morning  Elevated MCV MCV 107.4, folate and vitamin B12 levels will be checked  Acquired  hypothyroidism Continue levothyroxine    History of strokes Continue Plavix and Crestor for secondary prevention   Dementia Continue supportive care and Namenda  Depression Continue Paxil, Depakote   History of dysphagia/FEN Patient has a history of poor oral intake with concerns for aspiration Dysphagia 3 diet was recommended during last admission  DVT prophylaxis: Heparin drip  Advance Care Planning: CODE STATUS: Full code  Consults: Cardiology (by [facility] EDP)  Family Communication: Husband at bedside (all questions answered to satisfaction)  Severity of Illness: The appropriate patient status for this patient is INPATIENT. Inpatient status is judged to be reasonable and necessary in order to provide the required intensity of service to ensure the patient's safety. The patient's presenting symptoms, physical exam findings, and initial radiographic and laboratory data in the context of their chronic comorbidities is felt to place them at high risk for further clinical deterioration. Furthermore, it is not anticipated that the patient will be medically stable for discharge from the hospital within 2 midnights of admission.   * I certify that at the point of admission it is my clinical judgment that the patient will require inpatient hospital care spanning beyond 2 midnights from the point of admission due to high intensity of service, high risk for further deterioration and high frequency of surveillance required.*  Author: Frankey Shown, DO 08/03/2023 12:05 AM  For on call review [email or web address].

## 2023-08-02 NOTE — ED Provider Notes (Signed)
[place] EMERGENCY DEPARTMENT AT [facility] Provider Note   CSN: [id number] Arrival date & time: 08/02/23  1634     History {Add pertinent medical, surgical, social history, OB history to HPI:1} No chief complaint on file.   Susan Davidson is a 72 y.o. female.  She has a prior history of stroke and is nonambulatory at baseline.  She is brought in by ambulance for an episode of burning chest pain that lasted about 5 minutes occurring about 30 minutes ago.  It was not associated with any other symptoms.  She denies any history of any heart problems.  She said she has never had a discomfort like this before.  No dizziness diaphoresis shortness of breath.  The history is provided by the patient and the EMS personnel.  Chest Pain Pain location:  Substernal area Pain quality: burning   Pain radiates to:  Does not radiate Pain severity:  Moderate Onset quality:  Sudden Duration:  5 minutes Timing:  Constant Progression:  Resolved Chronicity:  New Relieved by:  None tried Worsened by:  Nothing Ineffective treatments:  None tried Associated symptoms: no cough, no diaphoresis, no dizziness, no fever, no nausea, no shortness of breath and no vomiting        Home Medications Prior to Admission medications   Medication Sig Start Date End Date Taking? Authorizing Provider  acetaminophen (TYLENOL) 325 MG tablet Take 2 tablets (650 mg total) by mouth every 6 (six) hours as needed for mild pain or headache (fever >/= 101). 08/04/21   Shon Hale, MD  albuterol (VENTOLIN HFA) 108 (90 Base) MCG/ACT inhaler Inhale 2 puffs into the lungs every 6 (six) hours as needed for wheezing or shortness of breath. Patient not taking: Reported on 07/31/2023 05/19/23   Shon Hale, MD  alendronate (FOSAMAX) 70 MG tablet Take 1 tablet (70 mg total) by mouth every 7 (seven) days. Take with a full glass of water on an empty stomach. 05/12/23   Corwin Levins, MD  ALPRAZolam Prudy Feeler) 0.5 MG  tablet TAKE 1 TABLET(0.5 MG) BY MOUTH TWICE DAILY AS NEEDED FOR ANXIETY 05/31/23   Corwin Levins, MD  ascorbic acid (VITAMIN C) 500 MG tablet Take 1 tablet (500 mg total) by mouth daily. 08/05/21   Shon Hale, MD  clopidogrel (PLAVIX) 75 MG tablet Take 1 tablet (75 mg total) by mouth daily. 06/08/23   Corwin Levins, MD  divalproex (DEPAKOTE) 250 MG DR tablet Take 1 tablet by mouth See admin instructions. 1 tablet in the morning and 2 tablets every evening 07/28/21   [provider]  lactose free nutrition (BOOST) LIQD Take 237 mLs by mouth 3 (three) times daily between meals. At least one a day and sometimes more    [provider]  levothyroxine (SYNTHROID) 75 MCG tablet Take 1 tablet (75 mcg total) by mouth daily. 05/19/23 05/18/24  Shon Hale, MD  linaclotide (LINZESS) 145 MCG CAPS capsule Take 1 capsule (145 mcg total) by mouth daily. 07/31/23   Ahmed, [name], MD  melatonin 5 MG TABS Take 5 mg by mouth.    [provider]  memantine (NAMENDA) 5 MG tablet Take 1 tablet (5 mg total) by mouth 2 (two) times daily. 06/08/23   Corwin Levins, MD  Multiple Vitamin (MULTIVITAMIN) tablet Take 1 tablet by mouth daily.    [provider]  OVER THE COUNTER MEDICATION Take 1 tablet by mouth daily. magnesium    [provider]  pantoprazole (PROTONIX)  40 MG tablet Take 1 tablet (40 mg total) by mouth daily. 06/08/23   Corwin Levins, MD  PARoxetine (PAXIL) 40 MG tablet Take 40 mg by mouth every morning. 01/01/22   [provider]  rosuvastatin (CRESTOR) 5 MG tablet TAKE 1 TABLET BY MOUTH DAILY 06/08/23   Corwin Levins, MD  senna-docusate (SENOKOT-S) 8.6-50 MG tablet Take 2 tablets by mouth at bedtime. 05/19/23 05/18/24  Shon Hale, MD      Allergies    Aleve [naproxen], Fire ant (solenopsis [place]), Covid-19 mrna vaccine (pfizer) [covid-19 mrna vacc (moderna)], Epinephrine, and Influenza vac split quad    Review of Systems   Review of Systems   Constitutional:  Negative for diaphoresis and fever.  Respiratory:  Negative for cough and shortness of breath.   Cardiovascular:  Positive for chest pain.  Gastrointestinal:  Negative for nausea and vomiting.  Neurological:  Negative for dizziness.    Physical Exam Updated Vital Signs BP (!) 157/79   Pulse 74   Temp 99.4 F (37.4 C) (Oral)   Resp 16   Ht 5\' 2"  (1.575 m)   Wt 63 kg   SpO2 94%   BMI 25.40 kg/m  Physical Exam Vitals and nursing note reviewed.  Constitutional:      General: She is not in acute distress.    Appearance: Normal appearance. She is well-developed.  HENT:     Head: Normocephalic and atraumatic.  Eyes:     Conjunctiva/sclera: Conjunctivae normal.  Cardiovascular:     Rate and Rhythm: Normal rate and regular rhythm.     Heart sounds: No murmur heard. Pulmonary:     Effort: Pulmonary effort is normal. No respiratory distress.     Breath sounds: Normal breath sounds.  Abdominal:     Palpations: Abdomen is soft.     Tenderness: There is no abdominal tenderness.  Musculoskeletal:        General: No deformity.     Cervical back: Neck supple.  Skin:    General: Skin is warm and dry.     Capillary Refill: Capillary refill takes less than 2 seconds.  Neurological:     Mental Status: She is alert.     Comments: He is awake and alert.  She is rather frail appearing.  She has minimal use of her lower extremities.     ED Results / Procedures / Treatments   Labs (all labs ordered are listed, but only abnormal results are displayed) Labs Reviewed  BASIC METABOLIC PANEL  CBC WITH DIFFERENTIAL/PLATELET  TROPONIN I (HIGH SENSITIVITY)    EKG EKG Interpretation Date/Time:  Wednesday August 02 2023 16:46:24 EDT Ventricular Rate:  72 PR Interval:  204 QRS Duration:  97 QT Interval:  439 QTC Calculation: 481 R Axis:   -71  Text Interpretation: Sinus rhythm Abnormal R-wave progression, early transition Probable inferior infarct, old No  significant change since prior 8/24 Confirmed by Meridee Score [phone]) on 08/02/2023 4:52:47 PM  Radiology No results found.  Procedures Procedures  {Document cardiac monitor, telemetry assessment procedure when appropriate:1}  Medications Ordered in ED Medications - No data to display  ED Course/ Medical Decision Making/ A&P   {   Click here for ABCD2, HEART and other calculatorsREFRESH Note before signing :1}                              Medical Decision Making Amount and/or Complexity of Data Reviewed Labs: ordered. Radiology:  ordered.   This patient complains of ***; this involves an extensive number of treatment Options and is a complaint that carries with it a high risk of complications and morbidity. The differential includes ***  I ordered, reviewed and interpreted labs, which included *** I ordered medication *** and reviewed PMP when indicated. I ordered imaging studies which included *** and I independently    visualized and interpreted imaging which showed *** Additional history obtained from *** Previous records obtained and reviewed *** I consulted *** and discussed lab and imaging findings and discussed disposition.  Cardiac monitoring reviewed, *** Social determinants considered, *** Critical Interventions: ***  After the interventions stated above, I reevaluated the patient and found *** Admission and further testing considered, ***   {Document critical care time when appropriate:1} {Document review of labs and clinical decision tools ie heart score, Chads2Vasc2 etc:1}  {Document your independent review of radiology images, and any outside records:1} {Document your discussion with family members, caretakers, and with consultants:1} {Document social determinants of health affecting pt's care:1} {Document your decision making why or why not admission, treatments were needed:1} Final Clinical Impression(s) / ED Diagnoses Final diagnoses:  None     Rx / DC Orders ED Discharge Orders     None

## 2023-08-02 NOTE — ED Notes (Signed)
This RN informed by Consulting civil engineer of the patients critical trop level, repeat EKG obtained. Pt denies CP at this time.

## 2023-08-02 NOTE — H&P (Incomplete)
History and Physical    Patient: Susan Davidson NFA:[id number] DOB: 10-09-1950 DOA: 08/02/2023 DOS: the patient was seen and examined on 08/03/2023 PCP: Corwin Levins, MD  Patient coming from: Home  Chief Complaint: No chief complaint on file.  HPI: Susan Davidson is a 72 y.o. female with medical history significant of hypothyroidism, hyperlipidemia, GERD, depression, history of CVA and nonambulatory at baseline who presents to the emergency department from home via EMS due to chest pain sustained at home.  Patient complained of an episode of chest pain which was described as burning sensation in midsternal area and lasted about 5 minutes.  Patient's nurse aide was with her when she had the chest pain, so she activated EMS and patient was sent to the ED for further evaluation and management.  She denies shortness of breath, nausea, vomiting, diaphoresis.  Patient was admitted from 9/14 to 8/16 due to acute respiratory failure with hypoxia secondary to community-acquired pneumonia which was treated with IV ceftriaxone and azithromycin and bronchodilators and was discharged home on p.o. Augmentin, albuterol inhaler as needed and Mucinex as needed.  She also presented at a time with acute metabolic encephalopathy that was due to benzos.  ED Course:  In the emergency department, BP was 157/79, other vital signs were within normal range.  Workup in the ED was normal except for hematocrit of 46.3, MCV 107.4.  BMP was normal except for blood glucose of 120.  Troponin x 2 - 2,035 > 2,056. Chest x-ray showed no active disease cardiologist on-call at [facility] was consulted and recommended admitting patient to [facility] with plan for cardiology team to consult on patient in the morning. Patient was started on IV heparin drip.  Hospitalist was asked to admit patient for further evaluation and management.   Review of Systems: Review of systems as noted in the HPI. All other systems reviewed and are  negative.   Past Medical History:  Diagnosis Date  . ANXIETY 02/12/2008   Qualifier: Diagnosis of  By: Jonny Ruiz MD, Len Blalock   . CVA (cerebral vascular accident) [facility])   . DEPRESSION 02/12/2008   Qualifier: Diagnosis of  By: Maris Berger   . HYPERLIPIDEMIA 02/12/2008   Qualifier: Diagnosis of  By: Maris Berger   . HYPERTENSION 02/12/2008   Qualifier: Diagnosis of  By: Maris Berger   . HYPOTHYROIDISM 02/12/2008   Qualifier: Diagnosis of  By: Maris Berger   . Impaired glucose tolerance 08/27/2011  . Left hemiparesis (HCC)   . OSTEOPENIA 02/12/2008   Qualifier: Diagnosis of  By: Jonny Ruiz MD, Len Blalock   . VITAMIN D DEFICIENCY 04/14/2010   Qualifier: Diagnosis of  By: Jonny Ruiz MD, Len Blalock    Past Surgical History:  Procedure Laterality Date  . IR RADIOLOGIST EVAL & MGMT  08/18/2021    Social History:  reports that she quit smoking about 2 years ago. Her smoking use included cigarettes. She has never used smokeless tobacco. She reports that she does not currently use alcohol. She reports that she does not use drugs.   Allergies  Allergen Reactions  . Aleve [Naproxen] Nausea Only  . Fire [employer] (Solenopsis [place]) Anaphylaxis  . Covid-19 Mrna Vaccine [employer]) [phone] Mrna Vacc (Moderna)]     Just the first round of moderna Covid vaccine, sluggish, uncoordinated, and weak, similar to flu.  . Influenza Vac Split Quad     Fatigue, mimics a "super bad flu"    Family History  Problem Relation Age of  Onset  . Heart disease Father   . Bipolar disorder Sister   . Diabetes Neg Hx      Prior to Admission medications   Medication Sig Start Date End Date Taking? Authorizing Provider  acetaminophen (TYLENOL) 325 MG tablet Take 2 tablets (650 mg total) by mouth every 6 (six) hours as needed for mild pain or headache (fever >/= 101). 08/04/21  Yes Emokpae, Courage, MD  albuterol (VENTOLIN HFA) 108 (90 Base) MCG/ACT inhaler Inhale 2 puffs into the lungs every  6 (six) hours as needed for wheezing or shortness of breath. 05/19/23  Yes Emokpae, Courage, MD  alendronate (FOSAMAX) 70 MG tablet Take 1 tablet (70 mg total) by mouth every 7 (seven) days. Take with a full glass of water on an empty stomach. 05/12/23  Yes Corwin Levins, MD  ALPRAZolam Prudy Feeler) 0.5 MG tablet TAKE 1 TABLET(0.5 MG) BY MOUTH TWICE DAILY AS NEEDED FOR ANXIETY Patient taking differently: Take 0.5 mg by mouth 2 (two) times daily. 05/31/23  Yes Corwin Levins, MD  ascorbic acid (VITAMIN C) 500 MG tablet Take 1 tablet (500 mg total) by mouth daily. Patient taking differently: Take 500 mg by mouth at bedtime. 08/05/21  Yes Emokpae, Courage, MD  Cholecalciferol (VITAMIN D-3 PO) Take 1 capsule by mouth at bedtime.   Yes [provider]  clopidogrel (PLAVIX) 75 MG tablet Take 1 tablet (75 mg total) by mouth daily. 06/08/23  Yes Corwin Levins, MD  divalproex (DEPAKOTE) 250 MG DR tablet Take 1 tablet by mouth See admin instructions. 1 tablet in the morning and 2 tablets every evening 07/28/21  Yes [provider]  folic acid (FOLVITE) 800 MCG tablet Take 400 mcg by mouth at bedtime.   Yes [provider]  ibuprofen (ADVIL) 200 MG tablet Take 200 mg by mouth every 6 (six) hours as needed for mild pain (pain score 1-3).   Yes [provider]  lactose free nutrition (BOOST) LIQD Take 237 mLs by mouth 2 (two) times daily between meals. At least one a day and sometimes more   Yes [provider]  levothyroxine (SYNTHROID) 75 MCG tablet Take 1 tablet (75 mcg total) by mouth daily. 05/19/23 05/18/24 Yes Emokpae, Courage, MD  linaclotide (LINZESS) 145 MCG CAPS capsule Take 1 capsule (145 mcg total) by mouth daily. 07/31/23  Yes Ahmed, [name], MD  melatonin 5 MG TABS Take 5 mg by mouth at bedtime.   Yes [provider]  memantine (NAMENDA) 5 MG tablet Take 1 tablet (5 mg total) by mouth 2 (two) times daily. 06/08/23  Yes Corwin Levins, MD  Multiple Vitamin  (MULTIVITAMIN) tablet Take 1 tablet by mouth at bedtime.   Yes [provider]  OVER THE COUNTER MEDICATION Take 1 tablet by mouth at bedtime. magnesium   Yes [provider]  pantoprazole (PROTONIX) 40 MG tablet Take 1 tablet (40 mg total) by mouth daily. 06/08/23  Yes Corwin Levins, MD  PARoxetine (PAXIL) 40 MG tablet Take 40 mg by mouth every morning. 01/01/22  Yes [provider]  rosuvastatin (CRESTOR) 5 MG tablet TAKE 1 TABLET BY MOUTH DAILY Patient taking differently: Take 5 mg by mouth at bedtime. TAKE 1 TABLET BY MOUTH DAILY 06/08/23  Yes Corwin Levins, MD    Physical Exam: BP (!) 153/92 (BP Location: Right Arm)   Pulse 73   Temp 99.5 F (37.5 C) (Oral)   Resp 16   Ht 5\' 2"  (1.575 m)  Wt 63 kg   SpO2 96%   BMI 25.40 kg/m   General: 72 y.o. year-old female well developed well nourished in no acute distress.  Alert and oriented x3. HEENT: NCAT, EOMI Neck: Supple, trachea medial Cardiovascular: Regular rate and rhythm with no rubs or gallops.  No thyromegaly or JVD noted.  No lower extremity edema. 2/4 pulses in all 4 extremities. Respiratory: Clear to auscultation with no wheezes or rales. Good inspiratory effort. Abdomen: Soft, nontender nondistended with normal bowel sounds x4 quadrants. Muskuloskeletal: No cyanosis, clubbing or edema noted bilaterally Neuro: CN II-XII intact, strength 5/5 x 4, sensation, reflexes intact Skin: No ulcerative lesions noted or rashes Psychiatry: Judgement and insight appear normal. Mood is appropriate for condition and setting          Labs on Admission:  Basic Metabolic Panel: Recent Labs  Lab 08/02/23 1805  NA 144  K 4.0  CL 106  CO2 27  GLUCOSE 120*  BUN 19  CREATININE 0.76  CALCIUM 8.8*   Liver Function Tests: No results for input(s): "AST", "ALT", "ALKPHOS", "BILITOT", "PROT", "ALBUMIN" in the last 168 hours. No results for input(s): "LIPASE", "AMYLASE" in the last 168 hours. No results for input(s):  "AMMONIA" in the last 168 hours. CBC: Recent Labs  Lab 08/02/23 1805  WBC 8.3  NEUTROABS 5.9  HGB 14.7  HCT 46.3*  MCV 107.4*  PLT ACLMP   Cardiac Enzymes: No results for input(s): "CKTOTAL", "CKMB", "CKMBINDEX", "TROPONINI" in the last 168 hours.  BNP (last 3 results) No results for input(s): "BNP" in the last 8760 hours.  ProBNP (last 3 results) No results for input(s): "PROBNP" in the last 8760 hours.  CBG: No results for input(s): "GLUCAP" in the last 168 hours.  Radiological Exams on Admission: DG Chest Port 1 View  Result Date: 08/02/2023 CLINICAL DATA:  Former smoker.  Dull chest pain EXAM: PORTABLE CHEST 1 VIEW COMPARISON:  None Available. FINDINGS: Normal mediastinum and cardiac silhouette. Low lung volumes. Normal pulmonary vasculature. No evidence of effusion, infiltrate, or pneumothorax. No acute bony abnormality. IMPRESSION: No active disease. Electronically Signed   By: Genevive Bi M.D.   On: 08/02/2023 18:51    EKG: I independently viewed the EKG done and my findings are as followed: Normal sinus rhythm at a rate of 76 bpm  Assessment/Plan Present on Admission: . NSTEMI (non-ST elevated myocardial infarction) (HCC) . Acquired hypothyroidism  Principal Problem:   NSTEMI (non-ST elevated myocardial infarction) (HCC) Active Problems:   Acquired hypothyroidism   History of stroke   Dementia without behavioral disturbance (HCC)   History of dysphagia   Elevated MCV  NSTEMI Patient presented with chest pain with burning sensation for about 5 minutes and it has since self resolved Troponin - 2,035 > 2,056 Cardiologist on[facility]call at [facility] was consulted and recommended admitting patient to [facility] with plan for cardiology team to follow-up on patient in the morning. Continue heparin drip Place patient n.p.o. at midnight in anticipation for possible intervention in the morning  Elevated MCV MCV 107.4, folate and vitamin B12 levels will be  checked  Acquired hypothyroidism Continue levothyroxine    History of strokes Continue Plavix and Crestor for secondary prevention   Dementia Continue supportive care and Namenda  Depression Continue Paxil, Depakote   History of dysphagia/FEN Patient has a history of poor oral intake with concerns for aspiration Dysphagia 3 diet was recommended during last admission  DVT prophylaxis: Heparin drip  Advance Care Planning: CODE STATUS: Full code  Consults: Cardiology (by [facility] EDP)  Family Communication: Husband at bedside (all questions answered to satisfaction)  Severity of Illness: The appropriate patient status for this patient is INPATIENT. Inpatient status is judged to be reasonable and necessary in order to provide the required intensity of service to ensure the patient's safety. The patient's presenting symptoms, physical exam findings, and initial radiographic and laboratory data in the context of their chronic comorbidities is felt to place them at high risk for further clinical deterioration. Furthermore, it is not anticipated that the patient will be medically stable for discharge from the hospital within 2 midnights of admission.   * I certify that at the point of admission it is my clinical judgment that the patient will require inpatient hospital care spanning beyond 2 midnights from the point of admission due to high intensity of service, high risk for further deterioration and high frequency of surveillance required.*  Author: Frankey Shown, DO 08/03/2023 12:05 AM  For on call review [email or web address].

## 2023-08-03 ENCOUNTER — Encounter (HOSPITAL_COMMUNITY)
Admission: EM | Disposition: A | Discharge status: Home Health | Payer: Self-pay | Source: Home / Self Care | Attending: Family Medicine

## 2023-08-03 ENCOUNTER — Inpatient Hospital Stay (HOSPITAL_COMMUNITY): Payer: Medicare HMO

## 2023-08-03 ENCOUNTER — Encounter (HOSPITAL_COMMUNITY): Payer: Self-pay | Admitting: Internal Medicine

## 2023-08-03 DIAGNOSIS — I214 Non-ST elevation (NSTEMI) myocardial infarction: Secondary | ICD-10-CM

## 2023-08-03 DIAGNOSIS — F0390 Unspecified dementia without behavioral disturbance: Secondary | ICD-10-CM | POA: Insufficient documentation

## 2023-08-03 DIAGNOSIS — I2510 Atherosclerotic heart disease of native coronary artery without angina pectoris: Secondary | ICD-10-CM

## 2023-08-03 DIAGNOSIS — Z87898 Personal history of other specified conditions: Secondary | ICD-10-CM

## 2023-08-03 DIAGNOSIS — R718 Other abnormality of red blood cells: Secondary | ICD-10-CM | POA: Insufficient documentation

## 2023-08-03 DIAGNOSIS — I97630 Postprocedural hematoma of a circulatory system organ or structure following a cardiac catheterization: Secondary | ICD-10-CM

## 2023-08-03 HISTORY — PX: LEFT HEART CATH AND CORONARY ANGIOGRAPHY: CATH118249

## 2023-08-03 LAB — ECHOCARDIOGRAM COMPLETE
AR max vel: 2.06 cm2
AV Area VTI: 1.98 cm2
AV Area mean vel: 2 cm2
AV Mean grad: 1 mm[Hg]
AV Peak grad: 2.2 mm[Hg]
Ao pk vel: 0.74 m/s
Area-P 1/2: 3.85 cm2
Height: 62 in
S' Lateral: 1.9 cm
Weight: 2222.24 [oz_av]

## 2023-08-03 LAB — FOLATE: Folate: >40 ng/mL (ref >5.9)

## 2023-08-03 LAB — CBC
HCT: 43.8 % (ref 36.0–46.0)
Hemoglobin: 14.1 g/dL (ref 12.0–15.0)
MCH: 33.4 pg (ref 26.0–34.0)
MCHC: 32.2 g/dL (ref 30.0–36.0)
MCV: 103.8 fL — ABNORMAL HIGH (ref 80.0–100.0)
Platelets: 237 10*3/uL (ref 150–400)
RBC: 4.22 MIL/uL (ref 3.87–5.11)
RDW: 15.6 % — ABNORMAL HIGH (ref 11.5–15.5)
WBC: 7.7 10*3/uL (ref 4.0–10.5)
nRBC: 0 % (ref 0.0–0.2)

## 2023-08-03 LAB — COMPREHENSIVE METABOLIC PANEL
ALT: 14 U/L (ref 0–44)
AST: 40 U/L (ref 15–41)
Albumin: 3.1 g/dL — ABNORMAL LOW (ref 3.5–5.0)
Alkaline Phosphatase: 38 U/L (ref 38–126)
Anion gap: 10 (ref 5–15)
BUN: 14 mg/dL (ref 8–23)
CO2: 25 mmol/L (ref 22–32)
Calcium: 8.5 mg/dL — ABNORMAL LOW (ref 8.9–10.3)
Chloride: 103 mmol/L (ref 98–111)
Creatinine, Ser: 0.68 mg/dL (ref 0.44–1.00)
GFR, Estimated: >60 mL/min (ref >60)
Glucose, Bld: 101 mg/dL — ABNORMAL HIGH (ref 70–99)
Potassium: 4.7 mmol/L (ref 3.5–5.1)
Sodium: 138 mmol/L (ref 135–145)
Total Bilirubin: 0.8 mg/dL (ref 0.3–1.2)
Total Protein: 6.2 g/dL — ABNORMAL LOW (ref 6.5–8.1)

## 2023-08-03 LAB — LIPID PANEL
Cholesterol: 146 mg/dL (ref 0–200)
HDL: 39 mg/dL — ABNORMAL LOW (ref >40)
LDL Cholesterol: 89 mg/dL (ref 0–99)
Total CHOL/HDL Ratio: 3.7 {ratio}
Triglycerides: 92 mg/dL (ref <150)
VLDL: 18 mg/dL (ref 0–40)

## 2023-08-03 LAB — PHOSPHORUS: Phosphorus: 2.6 mg/dL (ref 2.5–4.6)

## 2023-08-03 LAB — TROPONIN I (HIGH SENSITIVITY)
Troponin I (High Sensitivity): 2262 ng/L (ref <18)
Troponin I (High Sensitivity): 2903 ng/L (ref <18)

## 2023-08-03 LAB — HEPARIN LEVEL (UNFRACTIONATED)
Heparin Unfractionated: 1.02 [IU]/mL — ABNORMAL HIGH (ref 0.30–0.70)
Heparin Unfractionated: >1.1 [IU]/mL — ABNORMAL HIGH (ref 0.30–0.70)

## 2023-08-03 LAB — VITAMIN B12: Vitamin B-12: 1019 pg/mL — ABNORMAL HIGH (ref 180–914)

## 2023-08-03 LAB — MAGNESIUM: Magnesium: 2.3 mg/dL (ref 1.7–2.4)

## 2023-08-03 LAB — BRAIN NATRIURETIC PEPTIDE: B Natriuretic Peptide: 186.7 pg/mL — ABNORMAL HIGH (ref 0.0–100.0)

## 2023-08-03 SURGERY — LEFT HEART CATH AND CORONARY ANGIOGRAPHY
Anesthesia: LOCAL

## 2023-08-03 MED ORDER — ISOSORBIDE MONONITRATE ER 60 MG PO TB24
90.0000 mg | ORAL_TABLET | Freq: Every day | ORAL | Status: DC
  Administered 2023-08-04 – 2023-08-05 (×2): 90 mg via ORAL
  Filled 2023-08-03 (×2): qty 1

## 2023-08-03 MED ORDER — ACETAMINOPHEN 325 MG PO TABS
650.0000 mg | ORAL_TABLET | ORAL | Status: DC | PRN
Start: 2023-08-03 — End: 2023-08-05

## 2023-08-03 MED ORDER — SODIUM CHLORIDE 0.9 % IV SOLN
INTRAVENOUS | Status: AC
Start: 2023-08-03 — End: 2023-08-03

## 2023-08-03 MED ORDER — DIVALPROEX SODIUM 250 MG PO DR TAB: 250.0000 mg | DELAYED_RELEASE_TABLET | ORAL | Status: DC

## 2023-08-03 MED ORDER — ISOSORBIDE MONONITRATE ER 60 MG PO TB24
60.0000 mg | ORAL_TABLET | Freq: Every day | ORAL | Status: DC
  Administered 2023-08-03: 60 mg via ORAL
  Filled 2023-08-03: qty 1

## 2023-08-03 MED ORDER — LIDOCAINE HCL (PF) 1 % IJ SOLN
INTRAMUSCULAR | Status: AC
  Filled 2023-08-03: qty 30

## 2023-08-03 MED ORDER — HYDROMORPHONE HCL 1 MG/ML IJ SOLN
0.5000 mg | INTRAMUSCULAR | Status: DC | PRN
  Administered 2023-08-03 – 2023-08-05 (×2): 0.5 mg via INTRAVENOUS
  Filled 2023-08-03 (×2): qty 1

## 2023-08-03 MED ORDER — ROSUVASTATIN CALCIUM 20 MG PO TABS
20.0000 mg | ORAL_TABLET | Freq: Every day | ORAL | Status: DC
  Administered 2023-08-03 – 2023-08-04 (×2): 20 mg via ORAL
  Filled 2023-08-03 (×2): qty 1

## 2023-08-03 MED ORDER — LABETALOL HCL 5 MG/ML IV SOLN
10.0000 mg | INTRAVENOUS | Status: AC | PRN
Start: 2023-08-03 — End: 2023-08-03

## 2023-08-03 MED ORDER — PAROXETINE HCL 20 MG PO TABS
40.0000 mg | ORAL_TABLET | Freq: Every morning | ORAL | Status: DC
  Administered 2023-08-03 – 2023-08-05 (×3): 40 mg via ORAL
  Filled 2023-08-03 (×3): qty 2

## 2023-08-03 MED ORDER — LEVOTHYROXINE SODIUM 75 MCG PO TABS
75.0000 ug | ORAL_TABLET | Freq: Every day | ORAL | Status: DC
  Administered 2023-08-03 – 2023-08-05 (×3): 75 ug via ORAL
  Filled 2023-08-03 (×3): qty 1

## 2023-08-03 MED ORDER — DIVALPROEX SODIUM 500 MG PO DR TAB
500.0000 mg | DELAYED_RELEASE_TABLET | Freq: Every day | ORAL | Status: DC
  Administered 2023-08-03 – 2023-08-04 (×3): 500 mg via ORAL
  Filled 2023-08-03 (×5): qty 1

## 2023-08-03 MED ORDER — SODIUM CHLORIDE 0.9% FLUSH
3.0000 mL | Freq: Two times a day (BID) | INTRAVENOUS | Status: DC
  Administered 2023-08-03 – 2023-08-05 (×4): 3 mL via INTRAVENOUS

## 2023-08-03 MED ORDER — NITROGLYCERIN 0.4 MG SL SUBL
0.4000 mg | SUBLINGUAL_TABLET | SUBLINGUAL | Status: DC | PRN
  Administered 2023-08-03: 0.4 mg via SUBLINGUAL

## 2023-08-03 MED ORDER — MIDAZOLAM HCL 2 MG/2ML IJ SOLN
INTRAMUSCULAR | Status: DC | PRN
  Administered 2023-08-03: 1 mg via INTRAVENOUS

## 2023-08-03 MED ORDER — IOHEXOL 350 MG/ML SOLN
INTRAVENOUS | Status: DC | PRN
  Administered 2023-08-03: 35 mL

## 2023-08-03 MED ORDER — NITROGLYCERIN 0.4 MG SL SUBL
SUBLINGUAL_TABLET | SUBLINGUAL | Status: AC
  Filled 2023-08-03: qty 1

## 2023-08-03 MED ORDER — NITROGLYCERIN 2 % TD OINT
0.5000 [in_us] | TOPICAL_OINTMENT | Freq: Four times a day (QID) | TRANSDERMAL | Status: DC
  Administered 2023-08-03: 0.5 [in_us] via TOPICAL
  Filled 2023-08-03: qty 1

## 2023-08-03 MED ORDER — LIDOCAINE HCL (PF) 1 % IJ SOLN
INTRAMUSCULAR | Status: DC | PRN
  Administered 2023-08-03: 2 mL

## 2023-08-03 MED ORDER — NITROGLYCERIN 0.4 MG SL SUBL: 0.4000 mg | SUBLINGUAL_TABLET | SUBLINGUAL | Status: DC | PRN

## 2023-08-03 MED ORDER — DIVALPROEX SODIUM 250 MG PO DR TAB
250.0000 mg | DELAYED_RELEASE_TABLET | Freq: Every day | ORAL | Status: DC
  Administered 2023-08-03 – 2023-08-05 (×3): 250 mg via ORAL
  Filled 2023-08-03 (×4): qty 1

## 2023-08-03 MED ORDER — SODIUM CHLORIDE 0.9% FLUSH: 3.0000 mL | INTRAVENOUS | Status: DC | PRN

## 2023-08-03 MED ORDER — VERAPAMIL HCL 2.5 MG/ML IV SOLN
INTRAVENOUS | Status: AC
Start: 2023-08-03 — End: ?
  Filled 2023-08-03: qty 2

## 2023-08-03 MED ORDER — HEPARIN (PORCINE) 25000 UT/250ML-% IV SOLN: 600.0000 [IU]/h | INTRAVENOUS | Status: DC

## 2023-08-03 MED ORDER — SODIUM CHLORIDE 0.9 % IV SOLN
250.0000 mL | INTRAVENOUS | Status: AC | PRN
Start: 2023-08-03 — End: 2023-08-04

## 2023-08-03 MED ORDER — HEPARIN SODIUM (PORCINE) 1000 UNIT/ML IJ SOLN
INTRAMUSCULAR | Status: DC | PRN
  Administered 2023-08-03: 5000 [IU] via INTRA_ARTERIAL

## 2023-08-03 MED ORDER — RANOLAZINE ER 500 MG PO TB12
500.0000 mg | ORAL_TABLET | Freq: Once | ORAL | Status: AC
  Administered 2023-08-03: 500 mg via ORAL
  Filled 2023-08-03: qty 1

## 2023-08-03 MED ORDER — FENTANYL CITRATE (PF) 100 MCG/2ML IJ SOLN
INTRAMUSCULAR | Status: DC | PRN
  Administered 2023-08-03: 25 ug via INTRAVENOUS

## 2023-08-03 MED ORDER — SODIUM CHLORIDE 0.9 % WEIGHT BASED INFUSION: 1.0000 mL/kg/h | INTRAVENOUS | Status: DC

## 2023-08-03 MED ORDER — SODIUM CHLORIDE 0.9 % WEIGHT BASED INFUSION: 3.0000 mL/kg/h | INTRAVENOUS | Status: DC

## 2023-08-03 MED ORDER — MEMANTINE HCL 10 MG PO TABS
5.0000 mg | ORAL_TABLET | Freq: Two times a day (BID) | ORAL | Status: DC
  Administered 2023-08-03 – 2023-08-05 (×6): 5 mg via ORAL
  Filled 2023-08-03 (×6): qty 1

## 2023-08-03 MED ORDER — HYDRALAZINE HCL 20 MG/ML IJ SOLN
10.0000 mg | INTRAMUSCULAR | Status: AC | PRN
Start: 2023-08-03 — End: 2023-08-03

## 2023-08-03 MED ORDER — MIDAZOLAM HCL 2 MG/2ML IJ SOLN
INTRAMUSCULAR | Status: AC
Start: 2023-08-03 — End: ?
  Filled 2023-08-03: qty 2

## 2023-08-03 MED ORDER — HEPARIN (PORCINE) IN NACL 1000-0.9 UT/500ML-% IV SOLN
INTRAVENOUS | Status: DC | PRN
  Administered 2023-08-03 (×2): 500 mL

## 2023-08-03 MED ORDER — HEPARIN SODIUM (PORCINE) 1000 UNIT/ML IJ SOLN
INTRAMUSCULAR | Status: AC
  Filled 2023-08-03: qty 10

## 2023-08-03 MED ORDER — ALUM & MAG HYDROXIDE-SIMETH 200-200-20 MG/5ML PO SUSP
30.0000 mL | Freq: Four times a day (QID) | ORAL | Status: DC | PRN
  Administered 2023-08-03: 30 mL via ORAL
  Filled 2023-08-03: qty 30

## 2023-08-03 MED ORDER — CLOPIDOGREL BISULFATE 75 MG PO TABS
75.0000 mg | ORAL_TABLET | Freq: Every day | ORAL | Status: DC
  Administered 2023-08-03: 75 mg via ORAL
  Filled 2023-08-03: qty 1

## 2023-08-03 MED ORDER — VERAPAMIL HCL 2.5 MG/ML IV SOLN
INTRAVENOUS | Status: DC | PRN
  Administered 2023-08-03: 10 mL via INTRA_ARTERIAL

## 2023-08-03 MED ORDER — CLOPIDOGREL BISULFATE 75 MG PO TABS
75.0000 mg | ORAL_TABLET | Freq: Every day | ORAL | Status: DC
  Administered 2023-08-04 – 2023-08-05 (×2): 75 mg via ORAL
  Filled 2023-08-03 (×2): qty 1

## 2023-08-03 MED ORDER — PANTOPRAZOLE SODIUM 40 MG PO TBEC
40.0000 mg | DELAYED_RELEASE_TABLET | Freq: Every day | ORAL | Status: DC
  Administered 2023-08-03 – 2023-08-05 (×3): 40 mg via ORAL
  Filled 2023-08-03 (×3): qty 1

## 2023-08-03 MED ORDER — CLOPIDOGREL BISULFATE 75 MG PO TABS
600.0000 mg | ORAL_TABLET | Freq: Once | ORAL | Status: AC
  Administered 2023-08-03: 600 mg via ORAL
  Filled 2023-08-03: qty 8

## 2023-08-03 MED ORDER — FENTANYL CITRATE (PF) 100 MCG/2ML IJ SOLN
INTRAMUSCULAR | Status: AC
  Filled 2023-08-03: qty 2

## 2023-08-03 MED ORDER — MORPHINE SULFATE (PF) 2 MG/ML IV SOLN
1.0000 mg | INTRAVENOUS | Status: DC | PRN
  Administered 2023-08-03 – 2023-08-05 (×4): 1 mg via INTRAVENOUS
  Filled 2023-08-03 (×4): qty 1

## 2023-08-03 MED ORDER — ROSUVASTATIN CALCIUM 5 MG PO TABS
5.0000 mg | ORAL_TABLET | Freq: Every day | ORAL | Status: DC
  Administered 2023-08-03: 5 mg via ORAL
  Filled 2023-08-03: qty 1

## 2023-08-03 MED ORDER — HYDROMORPHONE HCL 1 MG/ML IJ SOLN: 0.5000 mg | Freq: Once | INTRAMUSCULAR | Status: DC

## 2023-08-03 MED ORDER — ASPIRIN 81 MG PO TBEC
81.0000 mg | DELAYED_RELEASE_TABLET | Freq: Every day | ORAL | Status: DC
  Administered 2023-08-03 – 2023-08-05 (×3): 81 mg via ORAL
  Filled 2023-08-03 (×3): qty 1

## 2023-08-03 SURGICAL SUPPLY — 7 items
CATH INFINITI 5FR ANG PIGTAIL (CATHETERS) IMPLANT
CATH INFINITI AMBI 6FR TG (CATHETERS) IMPLANT
DEVICE RAD TR BAND REGULAR (VASCULAR PRODUCTS) IMPLANT
GLIDESHEATH SLEND SS 6F .021 (SHEATH) IMPLANT
PACK CARDIAC CATHETERIZATION (CUSTOM PROCEDURE TRAY) ×1 IMPLANT
SET ATX-X65L (MISCELLANEOUS) IMPLANT
WIRE EMERALD 3MM-J .035X260CM (WIRE) IMPLANT

## 2023-08-03 NOTE — Plan of Care (Signed)

## 2023-08-03 NOTE — Plan of Care (Signed)
Cardiac Event note  Key event: -escalation chest pain, gave ranolazine and escalated cath. 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

## 2023-08-03 NOTE — Progress Notes (Signed)
PROGRESS NOTE    Susan Davidson  ZOX:[id number] DOB: [birth year] DOA: 08/02/2023 PCP: Corwin Levins, MD  No chief complaint on file.   Brief Narrative:   Susan Davidson is Susan Davidson 72 y.o. female with medical history significant of hypothyroidism, hyperlipidemia, GERD, depression, history of CVA and nonambulatory at baseline who presents to the emergency department from home via EMS due to chest pain sustained at home.   She was admitted for NSTEMI.  Transferred to [facility] from [facility].    Assessment & Plan:   Principal Problem:   NSTEMI (non-ST elevated myocardial infarction) [facility]) Active Problems:   Acquired hypothyroidism   History of stroke   Dementia without behavioral disturbance (HCC)   History of dysphagia   Elevated MCV  NSTEMI Troponin has peaked at 2,262 Echo with EF 60-65%, no RWMA, small pericardial effusion Developed CP -> cath per cards -> Now s/p LHC, high grade mid RCA lesion -> planning medical therapy - consider PCI if anginal symptoms can't be controlled (consider complex PCI Monday if within goals of care if fails trial of medical therapy) Davidson, aspirin, crestor    ACA aneurysm Noted,  Elevated MCV Normal folate and b12   Acquired hypothyroidism Continue levothyroxine    History of strokes Continue Davidson and Crestor for secondary prevention   Dementia Continue supportive care and Namenda   Depression Continue Paxil, Depakote   History of dysphagia/FEN Patient has Susan Davidson history of poor oral intake with concerns for aspiration Dysphagia 3 diet was recommended during last admission SLP      DVT prophylaxis: heparin gtt Code Status: full Family Communication: husband at bedside Disposition:   Status is: Inpatient Remains inpatient appropriate because: continued need for inpatient care   Consultants:  cardiology  Procedures:    Mid RCA lesion is 95% stenosed.   1.  High-grade mid right coronary artery lesion.  After review with Dr. Izora Ribas  and given its tortuosity and calcification in the context of the patient's comorbidities we have elected to pursue medical therapy.  If the patient is anginal symptoms cannot be controlled PCI can be considered. 2.  Mild disease of small caliber LAD and left circumflex. 3.  LVEDP of 10 mmHg:   Summary: Medical therapy for acute coronary syndrome.  Susan Davidson load was ordered.  If the patient's chest pain cannot be controlled with medical therapy PCI can be considered.  Antimicrobials:  Anti-infectives (From admission, onward)    None       Subjective: Initially without CP Later called with worsening CP  Husband at bedside  Objective: Vitals:   08/03/23 1549 08/03/23 1554 08/03/23 1559 08/03/23 1604  BP: 109/65 99/64 109/63 103/72  Pulse: 94 91 91 (!) 0  Resp: 17 16 15    Temp:      TempSrc:      SpO2: 95% 96% 96%   Weight:      Height:        Intake/Output Summary (Last 24 hours) at 08/03/2023 1754 Last data filed at 08/03/2023 1624 Gross per 24 hour  Intake 137.69 ml  Output --  Net 137.69 ml   Filed Weights   08/02/23 1639 08/03/23 1158  Weight: 63 kg 62.8 kg    Examination:  General exam: Appears calm and comfortable  Respiratory system: unlabored Cardiovascular system: RRR Gastrointestinal system: Abdomen is nondistended, soft and nontender.  Central nervous system: Alert and oriented. No focal neurological deficits. Extremities: no LEE   Data Reviewed: I have personally reviewed following  labs and imaging studies  CBC: Recent Labs  Lab 08/02/23 1805 08/03/23 0617  WBC 8.3 7.7  NEUTROABS 5.9  --   HGB 14.7 14.1  HCT 46.3* 43.8  MCV 107.4* 103.8*  PLT ACLMP 237    Basic Metabolic Panel: Recent Labs  Lab 08/02/23 1805 08/03/23 0617  NA 144 138  K 4.0 4.7  CL 106 103  CO2 27 25  GLUCOSE 120* 101*  BUN 19 14  CREATININE 0.76 0.68  CALCIUM 8.8* 8.5*  MG  --  2.3  PHOS  --  2.6    GFR: Estimated Creatinine Clearance: 53.8 mL/min (by  C-G formula based on SCr of 0.68 mg/dL).  Liver Function Tests: Recent Labs  Lab 08/03/23 0617  AST 40  ALT 14  ALKPHOS 38  BILITOT 0.8  PROT 6.2*  ALBUMIN 3.1*    CBG: No results for input(s): "GLUCAP" in the last 168 hours.   No results found for this or any previous visit (from the past 240 hour(s)).       Radiology Studies: CARDIAC CATHETERIZATION  Result Date: 08/03/2023   Mid RCA lesion is 95% stenosed. 1.  High-grade mid right coronary artery lesion.  After review with Dr. Izora Ribas and given its tortuosity and calcification in the context of the patient's comorbidities we have elected to pursue medical therapy.  If the patient is anginal symptoms cannot be controlled PCI can be considered. 2.  Mild disease of small caliber LAD and left circumflex. 3.  LVEDP of 10 mmHg: Summary: Medical therapy for acute coronary syndrome.  Susan Davidson load was ordered.  If the patient's chest pain cannot be controlled with medical therapy PCI can be considered.   ECHOCARDIOGRAM COMPLETE  Result Date: 08/03/2023    ECHOCARDIOGRAM REPORT   Patient Name:   Susan Davidson Date of Exam: 08/03/2023 Medical Rec #:  [id number]       Height:       62.0 in Accession #:    [id number]      Weight:       138.9 lb Date of Birth:  November 29, 1950        BSA:          1.637 m Patient Age:    72 years        BP:           133/90 mmHg Patient Gender: F               HR:           81 bpm. Exam Location:  Inpatient Procedure: 2D Echo, Cardiac Doppler and Color Doppler Indications:    NSTEMI  History:        Patient has prior history of Echocardiogram examinations, most                 recent 07/04/2021. Stroke and CKD; Risk Factors:Hypertension,                 Dyslipidemia and Former Smoker.  Sonographer:    Dondra Prader RVT RCS Referring Phys: [id number] Susan Davidson  Sonographer Comments: Technically challenging study due to limited acoustic windows, Technically difficult study due to poor echo windows, suboptimal  parasternal window, suboptimal apical window and suboptimal subcostal window. Image acquisition challenging due to uncooperative patient and Image acquisition challenging due to respiratory motion. IMPRESSIONS  1. Left ventricular ejection fraction, by estimation, is 60 to 65%. The left ventricle has normal function. The left ventricle has no regional  labs and imaging studies  CBC: Recent Labs  Lab 08/02/23 1805 08/03/23 0617  WBC 8.3 7.7  NEUTROABS 5.9  --   HGB 14.7 14.1  HCT 46.3* 43.8  MCV 107.4* 103.8*  PLT ACLMP 237    Basic Metabolic Panel: Recent Labs  Lab 08/02/23 1805 08/03/23 0617  NA 144 138  K 4.0 4.7  CL 106 103  CO2 27 25  GLUCOSE 120* 101*  BUN 19 14  CREATININE 0.76 0.68  CALCIUM 8.8* 8.5*  MG  --  2.3  PHOS  --  2.6    GFR: Estimated Creatinine Clearance: 53.8 mL/min (by  C-G formula based on SCr of 0.68 mg/dL).  Liver Function Tests: Recent Labs  Lab 08/03/23 0617  AST 40  ALT 14  ALKPHOS 38  BILITOT 0.8  PROT 6.2*  ALBUMIN 3.1*    CBG: No results for input(s): "GLUCAP" in the last 168 hours.   No results found for this or any previous visit (from the past 240 hour(s)).       Radiology Studies: CARDIAC CATHETERIZATION  Result Date: 08/03/2023   Mid RCA lesion is 95% stenosed. 1.  High-grade mid right coronary artery lesion.  After review with Dr. Izora Ribas and given its tortuosity and calcification in the context of the patient's comorbidities we have elected to pursue medical therapy.  If the patient is anginal symptoms cannot be controlled PCI can be considered. 2.  Mild disease of small caliber LAD and left circumflex. 3.  LVEDP of 10 mmHg: Summary: Medical therapy for acute coronary syndrome.  Susan Davidson load was ordered.  If the patient's chest pain cannot be controlled with medical therapy PCI can be considered.   ECHOCARDIOGRAM COMPLETE  Result Date: 08/03/2023    ECHOCARDIOGRAM REPORT   Patient Name:   Susan Davidson Date of Exam: 08/03/2023 Medical Rec #:  [id number]       Height:       62.0 in Accession #:    [id number]      Weight:       138.9 lb Date of Birth:  November 29, 1950        BSA:          1.637 m Patient Age:    72 years        BP:           133/90 mmHg Patient Gender: F               HR:           81 bpm. Exam Location:  Inpatient Procedure: 2D Echo, Cardiac Doppler and Color Doppler Indications:    NSTEMI  History:        Patient has prior history of Echocardiogram examinations, most                 recent 07/04/2021. Stroke and CKD; Risk Factors:Hypertension,                 Dyslipidemia and Former Smoker.  Sonographer:    Dondra Prader RVT RCS Referring Phys: [id number] Susan Davidson  Sonographer Comments: Technically challenging study due to limited acoustic windows, Technically difficult study due to poor echo windows, suboptimal  parasternal window, suboptimal apical window and suboptimal subcostal window. Image acquisition challenging due to uncooperative patient and Image acquisition challenging due to respiratory motion. IMPRESSIONS  1. Left ventricular ejection fraction, by estimation, is 60 to 65%. The left ventricle has normal function. The left ventricle has no regional  labs and imaging studies  CBC: Recent Labs  Lab 08/02/23 1805 08/03/23 0617  WBC 8.3 7.7  NEUTROABS 5.9  --   HGB 14.7 14.1  HCT 46.3* 43.8  MCV 107.4* 103.8*  PLT ACLMP 237    Basic Metabolic Panel: Recent Labs  Lab 08/02/23 1805 08/03/23 0617  NA 144 138  K 4.0 4.7  CL 106 103  CO2 27 25  GLUCOSE 120* 101*  BUN 19 14  CREATININE 0.76 0.68  CALCIUM 8.8* 8.5*  MG  --  2.3  PHOS  --  2.6    GFR: Estimated Creatinine Clearance: 53.8 mL/min (by  C-G formula based on SCr of 0.68 mg/dL).  Liver Function Tests: Recent Labs  Lab 08/03/23 0617  AST 40  ALT 14  ALKPHOS 38  BILITOT 0.8  PROT 6.2*  ALBUMIN 3.1*    CBG: No results for input(s): "GLUCAP" in the last 168 hours.   No results found for this or any previous visit (from the past 240 hour(s)).       Radiology Studies: CARDIAC CATHETERIZATION  Result Date: 08/03/2023   Mid RCA lesion is 95% stenosed. 1.  High-grade mid right coronary artery lesion.  After review with Dr. Izora Ribas and given its tortuosity and calcification in the context of the patient's comorbidities we have elected to pursue medical therapy.  If the patient is anginal symptoms cannot be controlled PCI can be considered. 2.  Mild disease of small caliber LAD and left circumflex. 3.  LVEDP of 10 mmHg: Summary: Medical therapy for acute coronary syndrome.  Susan Davidson load was ordered.  If the patient's chest pain cannot be controlled with medical therapy PCI can be considered.   ECHOCARDIOGRAM COMPLETE  Result Date: 08/03/2023    ECHOCARDIOGRAM REPORT   Patient Name:   Susan Davidson Date of Exam: 08/03/2023 Medical Rec #:  [id number]       Height:       62.0 in Accession #:    [id number]      Weight:       138.9 lb Date of Birth:  November 29, 1950        BSA:          1.637 m Patient Age:    72 years        BP:           133/90 mmHg Patient Gender: F               HR:           81 bpm. Exam Location:  Inpatient Procedure: 2D Echo, Cardiac Doppler and Color Doppler Indications:    NSTEMI  History:        Patient has prior history of Echocardiogram examinations, most                 recent 07/04/2021. Stroke and CKD; Risk Factors:Hypertension,                 Dyslipidemia and Former Smoker.  Sonographer:    Dondra Prader RVT RCS Referring Phys: [id number] Susan Davidson  Sonographer Comments: Technically challenging study due to limited acoustic windows, Technically difficult study due to poor echo windows, suboptimal  parasternal window, suboptimal apical window and suboptimal subcostal window. Image acquisition challenging due to uncooperative patient and Image acquisition challenging due to respiratory motion. IMPRESSIONS  1. Left ventricular ejection fraction, by estimation, is 60 to 65%. The left ventricle has normal function. The left ventricle has no regional

## 2023-08-03 NOTE — Interval H&P Note (Signed)
History and Physical Interval Note:  08/03/2023 3:14 PM  Susan Davidson  has presented today for surgery, with the diagnosis of nstemi.  The various methods of treatment have been discussed with the patient and family. After consideration of risks, benefits and other options for treatment, the patient has consented to  Procedure(s): LEFT HEART CATH AND CORONARY ANGIOGRAPHY (N/A) as a surgical intervention.  The patient's history has been reviewed, patient examined, no change in status, stable for surgery.  I have reviewed the patient's chart and labs.  Questions were answered to the patient's satisfaction.     Orbie Pyo

## 2023-08-03 NOTE — Progress Notes (Signed)
OT Cancellation Note  Patient Details Name: Susan Davidson MRN: [id number] DOB: 1951/03/16   Cancelled Treatment:    Reason Eval/Treat Not Completed: Medical issues which prohibited therapy.  Pt with chest pain and elevated troponin levels this am. Will hold eval per PA and will continue to follow.   [name] D [name] 08/03/2023, 10:59 AM 08/03/2023  RP, OTR/L  Acute Rehabilitation Services  Office:  [phone]

## 2023-08-03 NOTE — Consult Note (Addendum)
Cardiology Consultation   Patient ID: Susan Davidson MRN: [id number]; DOB: 1951/01/21  Admit date: 08/02/2023 Date of Consult: 08/03/2023  PCP:  Corwin Levins, MD   [facility] Providers Cardiologist:  None        Patient Profile:   Susan Davidson is a 72 y.o. female with a hx of dementia, prior CVA, HTN, HLD, CKD stage III who is being seen 08/03/2023 for the evaluation of NSTEMI at the request of the Hospitalist service.    History of Present Illness:   Ms. Susan Davidson presented with the acute onset, substernal chest burning that started early this morning prior to her ED visit.  She states the pain was sudden, 10/10, nonradiating pain that was unrelated to exertion.  She denies palpitations shortness of breath paroxysmal nocturnal dyspnea, nausea or vomiting.  She states she was in her usual state of health prior to the onset of her symptoms.  [name] is primarily bed-bound for at least 80% of the day.  She lives with her husband, who is her primary caretaker.  He normally helps her ambulate, otherwise she uses a cane or walker.  She is otherwise functional and is able to bathe herself.  She denies smoking.  Patient was recently hospitalized in August, 2024 for community-acquired pneumonia.  She has never seen a cardiologist.  She was previously admitted in 01/2021 for left-sided weakness/dysarthria and was found to have a cryptogenic stroke.  She was discharged with a Holter monitor however it appears that the patient never used it.  In the ED, initial vitals included afebrile, heart rate 74, blood pressure 157/79, SpO2 94% on room air.  Notable labs include K of 4.0, serum creatinine 0.76, high-sensitivity troponin 2035-> 2056 (delta 1 hr), WBC 8.3, hgb 14.7.  ECG demonstrates normal sinus rhythm with Q waves in leads II and aVF (these appear new when compared to 05/17/2023).  CXR does not show evidence of pleural effusion.  POC [place] by me was limited due to poor acoustic windows,  but did not show pericardial effusion.  Difficulty assessing wall motion abnormalities.  IVC was less than 2 cm and fully collapsible on inspiration.  Past Medical History:  Diagnosis Date   ANXIETY 02/12/2008   Qualifier: Diagnosis of  By: Jonny Ruiz MD, Len Blalock    CVA (cerebral vascular accident) [facility])    DEPRESSION 02/12/2008   Qualifier: Diagnosis of  By: Maris Berger    HYPERLIPIDEMIA 02/12/2008   Qualifier: Diagnosis of  By: Maris Berger    HYPERTENSION 02/12/2008   Qualifier: Diagnosis of  By: Maris Berger    HYPOTHYROIDISM 02/12/2008   Qualifier: Diagnosis of  By: Maris Berger    Impaired glucose tolerance 08/27/2011   Left hemiparesis (HCC)    OSTEOPENIA 02/12/2008   Qualifier: Diagnosis of  By: Jonny Ruiz MD, Len Blalock    VITAMIN D DEFICIENCY 04/14/2010   Qualifier: Diagnosis of  By: Jonny Ruiz MD, Len Blalock     Past Surgical History:  Procedure Laterality Date   IR RADIOLOGIST EVAL & MGMT  08/18/2021     Home Medications:  Prior to Admission medications   Medication Sig Start Date End Date Taking? Authorizing Provider  acetaminophen (TYLENOL) 325 MG tablet Take 2 tablets (650 mg total) by mouth every 6 (six) hours as needed for mild pain or headache (fever >/= 101). 08/04/21  Yes Emokpae, Courage, MD  albuterol (VENTOLIN HFA) 108 (90 Base) MCG/ACT inhaler Inhale 2 puffs into the lungs  every 6 (six) hours as needed for wheezing or shortness of breath. 05/19/23  Yes Emokpae, Courage, MD  alendronate (FOSAMAX) 70 MG tablet Take 1 tablet (70 mg total) by mouth every 7 (seven) days. Take with a full glass of water on an empty stomach. 05/12/23  Yes Corwin Levins, MD  ALPRAZolam Prudy Feeler) 0.5 MG tablet TAKE 1 TABLET(0.5 MG) BY MOUTH TWICE DAILY AS NEEDED FOR ANXIETY Patient taking differently: Take 0.5 mg by mouth 2 (two) times daily. 05/31/23  Yes Corwin Levins, MD  ascorbic acid (VITAMIN C) 500 MG tablet Take 1 tablet (500 mg total) by mouth daily. Patient  taking differently: Take 500 mg by mouth at bedtime. 08/05/21  Yes Emokpae, Courage, MD  Cholecalciferol (VITAMIN D-3 PO) Take 1 capsule by mouth at bedtime.   Yes [provider]  clopidogrel (PLAVIX) 75 MG tablet Take 1 tablet (75 mg total) by mouth daily. 06/08/23  Yes Corwin Levins, MD  divalproex (DEPAKOTE) 250 MG DR tablet Take 1 tablet by mouth See admin instructions. 1 tablet in the morning and 2 tablets every evening 07/28/21  Yes [provider]  folic acid (FOLVITE) 800 MCG tablet Take 400 mcg by mouth at bedtime.   Yes [provider]  ibuprofen (ADVIL) 200 MG tablet Take 200 mg by mouth every 6 (six) hours as needed for mild pain (pain score 1-3).   Yes [provider]  lactose free nutrition (BOOST) LIQD Take 237 mLs by mouth 2 (two) times daily between meals. At least one a day and sometimes more   Yes [provider]  levothyroxine (SYNTHROID) 75 MCG tablet Take 1 tablet (75 mcg total) by mouth daily. 05/19/23 05/18/24 Yes Emokpae, Courage, MD  linaclotide (LINZESS) 145 MCG CAPS capsule Take 1 capsule (145 mcg total) by mouth daily. 07/31/23  Yes Ahmed, [name], MD  melatonin 5 MG TABS Take 5 mg by mouth at bedtime.   Yes [provider]  memantine (NAMENDA) 5 MG tablet Take 1 tablet (5 mg total) by mouth 2 (two) times daily. 06/08/23  Yes Corwin Levins, MD  Multiple Vitamin (MULTIVITAMIN) tablet Take 1 tablet by mouth at bedtime.   Yes [provider]  OVER THE COUNTER MEDICATION Take 1 tablet by mouth at bedtime. magnesium   Yes [provider]  pantoprazole (PROTONIX) 40 MG tablet Take 1 tablet (40 mg total) by mouth daily. 06/08/23  Yes Corwin Levins, MD  PARoxetine (PAXIL) 40 MG tablet Take 40 mg by mouth every morning. 01/01/22  Yes [provider]  rosuvastatin (CRESTOR) 5 MG tablet TAKE 1 TABLET BY MOUTH DAILY Patient taking differently: Take 5 mg by mouth at bedtime. 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   Influenza Vac Split Quad     Fatigue, mimics a "super bad flu"    Social History:   Social History   Socioeconomic History   Marital status: Married    Spouse name: Not on file  Number of children: 0   Years of education: Not on file   Highest education level: Not on file  Occupational History   Occupation: disabled  Tobacco Use   Smoking status: Former    Current packs/day: 0.00    Types: Cigarettes    Quit date: 01/26/2021    Years since quitting: 2.5   Smokeless tobacco: Never  Vaping Use   Vaping status: Never Used  Substance and Sexual Activity   Alcohol use: Not Currently   Drug use: No   Sexual activity: Not on file  Other Topics Concern   Not on file  Social History Narrative   Not on file   Social Determinants of Health   Financial Resource Strain: Low Risk  (04/27/2023)   Overall Financial Resource Strain (CARDIA)    Difficulty of Paying Living Expenses: Not hard at all  Food Insecurity: No Food Insecurity (05/22/2023)   Hunger Vital Sign    Worried About Running Out of Food in the Last Year: Never true    Ran Out of Food in the Last Year: Never true  Transportation Needs: No Transportation Needs (05/22/2023)   PRAPARE - Administrator, Civil Service (Medical): No    Lack of Transportation (Non-Medical): No  Physical Activity: Inactive (04/27/2023)   Exercise Vital Sign    Days of Exercise per Week: 0 days    Minutes of Exercise per  Session: 0 min  Stress: Stress Concern Present (04/27/2023)   [employer] of Occupational Health - Occupational Stress Questionnaire    Feeling of Stress : To some extent  Social Connections: Socially Isolated (04/27/2023)   Social Connection and Isolation Panel [NHANES]    Frequency of Communication with Friends and Family: Never    Frequency of Social Gatherings with Friends and Family: Never    Attends Religious Services: Never    Database administrator or Organizations: No    Attends Banker Meetings: Never    Marital Status: Married  Catering manager Violence: Patient Unable To Answer (05/17/2023)   Humiliation, Afraid, Rape, and Kick questionnaire    Fear of Current or Ex-Partner: Patient unable to answer    Emotionally Abused: Patient unable to answer    Physically Abused: Patient unable to answer    Sexually Abused: Patient unable to answer    Family History:    Family History  Problem Relation Age of Onset   Heart disease Father    Bipolar disorder Sister    Diabetes Neg Hx      ROS:  Please see the history of present illness.   All other ROS reviewed and negative.     Physical Exam/Data:   Vitals:   08/02/23 2000 08/02/23 2011 08/02/23 2012 08/02/23 2226  BP:   127/61 (!) 153/92  Pulse: 77  77 73  Resp: 14  16   Temp:  98.8 F (37.1 C)  99.5 F (37.5 C)  TempSrc:  Oral  Oral  SpO2: 93%  96% 96%  Weight:      Height:       No intake or output data in the 24 hours ending 08/03/23 0005    08/02/2023    4:39 PM 07/31/2023    9:15 AM 06/16/2023    9:35 AM  Last 3 Weights  Weight (lbs) 138 lb 14.2 oz 140 lb 140 lb  Weight (kg) 63 kg 63.504 kg 63.504 kg     Body mass index is 25.4 kg/m.  General:  [place]ler[place] woman  HEENT: normal Neck: no JVD at 60 deg Vascular: No carotid bruits; Distal pulses 2+ bilaterally Cardiac:  normal S1, S2; RRR; no murmur  Lungs:  clear to auscultation bilaterally, no wheezing, rhonchi or rales  Abd: soft,  nontender, no hepatomegaly  Ext: no edema Musculoskeletal:  No deformities, BUE and BLE strength normal and equal Skin: warm and dry  Neuro:  CNs 2-12 intact, left sided facial droop Psych:  Normal affect   EKG:  The EKG was personally reviewed and demonstrates: Sinus rhythm with Q waves in leads II and AVF Telemetry:  Telemetry was personally reviewed and demonstrates:  NSR  Relevant CV Studies:  Echo 08/2021  1. Left ventricular ejection fraction, by estimation, is 70 to 75%. The  left ventricle has hyperdynamic function. The left ventricle has no  regional wall motion abnormalities. Left ventricular diastolic parameters  are indeterminate.   2. Right ventricular systolic function is normal. The right ventricular  size is normal. Tricuspid regurgitation signal is inadequate for assessing  PA pressure.   3. There is a trivial pericardial effusion anterior to the right  ventricle and posterior to the left ventricle.   4. The mitral valve is grossly normal. Trivial mitral valve  regurgitation.   5. The aortic valve is tricuspid. There is mild calcification of the  aortic valve. Aortic valve regurgitation is not visualized. Mild aortic  valve sclerosis is present, with no evidence of aortic valve stenosis.  Aortic valve mean gradient measures 3.0  mmHg.   6. The inferior vena cava is normal in size with greater than 50%  respiratory variability, suggesting right atrial pressure of 3 mmHg.    Laboratory Data:  High Sensitivity Troponin:   Recent Labs  Lab 08/02/23 1805 08/02/23 1949  TROPONINIHS 2,035* 2,056*     Chemistry Recent Labs  Lab 08/02/23 1805  NA 144  K 4.0  CL 106  CO2 27  GLUCOSE 120*  BUN 19  CREATININE 0.76  CALCIUM 8.8*  GFRNONAA >60  ANIONGAP 11    No results for input(s): "PROT", "ALBUMIN", "AST", "ALT", "ALKPHOS", "BILITOT" in the last 168 hours. Lipids No results for input(s): "CHOL", "TRIG", "HDL", "LABVLDL", "LDLCALC", "CHOLHDL" in the  last 168 hours.  Hematology Recent Labs  Lab 08/02/23 1805  WBC 8.3  RBC 4.31  HGB 14.7  HCT 46.3*  MCV 107.4*  MCH 34.1*  MCHC 31.7  RDW 15.8*  PLT ACLMP   Thyroid No results for input(s): "TSH", "FREET4" in the last 168 hours.  BNPNo results for input(s): "BNP", "PROBNP" in the last 168 hours.  DDimer No results for input(s): "DDIMER" in the last 168 hours.   Radiology/Studies:  DG Chest Port 1 View  Result Date: 08/02/2023 CLINICAL DATA:  Former smoker.  Dull chest pain EXAM: PORTABLE CHEST 1 VIEW COMPARISON:  None Available. FINDINGS: Normal mediastinum and cardiac silhouette. Low lung volumes. Normal pulmonary vasculature. No evidence of effusion, infiltrate, or pneumothorax. No acute bony abnormality. IMPRESSION: No active disease. Electronically Signed   By: Genevive Bi M.D.   On: 08/02/2023 18:51     Assessment and Plan:   MONZERRAT HUBANKS is a 72 y.o. female with a hx of dementia, prior CVA, HTN, HLD, CKD stage III who is being seen 08/03/2023 for the evaluation of NSTEMI.  Type I NSTEMI Ms Kois endorsed acute onset substernal chest pain that resolved prior to her ED admission. Her chest pain does not have typical cardiac features, however, she has several  risk factors for coronary artery disease which include age and hypertension.  Moreover she presented with a troponin of 2000 and has what appears to be new Q waves in the inferior leads, thus I have a high suspicion for acute coronary syndrome.  I was unable to assess for signs of ischemia on echocardiography.  Reassuringly, she has no signs of congestive heart failure on physical exam.  Her grace score is 110.  She does not require emergent coronary angiography.  Le is a frail, nonfunctional at home and expressed to me her desire to avoid adding any further medications.  The extent of myocardial injury is unclear and her LV function would need to be assessed on echocardiography, and her symptoms resolved prior  to her ED visit.  I think her coronary anatomy would certainly need to be defined, whether that be from coronary angiography or coronary CT.  For now, I would treat medically for ACS, however I think further conversation about the risk and benefits of coronary angiography would need to be discussed with her and the husband.   Plan -continue heparin gtt -load with 325mg  aspirin -avoid P2Y12 for now -f/u TTE -f/u BNP -discuss coronary angiography vs. Coronary CT with family     Risk Assessment/Risk Scores:     TIMI Risk Score for Unstable Angina or Non-ST Elevation MI:   The patient's TIMI risk score is  , which indicates a  % risk of all cause mortality, new or recurrent myocardial infarction or need for urgent revascularization in the next 14 days.          For questions or updates, please contact [facility] Please consult [email or web address] for contact info under    Signed, Donavan Burnet, MD  08/03/2023 12:05 AM

## 2023-08-03 NOTE — Consult Note (Signed)
[facility] [place]  ASSESSMENT / PLAN: 72 y.o. female with mild right radial post catheterization hematoma. No compressive symptoms. No evidence of compartment syndrome. Distal radial pulse is palpable. Compression bandage applied. Duplex ultrasound will be performed tomorrow to evaluate for pseudoaneurysm.  CHIEF COMPLAINT: post cath pseudoaneurysm  HISTORY OF PRESENT ILLNESS: Susan Davidson is a 72 y.o. female who underwent coronary catheterization for NSTEMI via right radial artery access earlier today.  Patient developed a hematoma in her right wrist after a transradial band was released.  The cardiology PA asked me for help.  On my arrival, the patient was resting comfortably in no distress.  She reports no pain in her right arm.  She is demented and cannot give a clear history.  Past Medical History:  Diagnosis Date   ANXIETY 02/12/2008   Qualifier: Diagnosis of  By: Jonny Ruiz MD, Len Blalock    CVA (cerebral vascular accident) [facility])    DEPRESSION 02/12/2008   Qualifier: Diagnosis of  By: Maris Berger    HYPERLIPIDEMIA 02/12/2008   Qualifier: Diagnosis of  By: Maris Berger    HYPERTENSION 02/12/2008   Qualifier: Diagnosis of  By: Maris Berger    HYPOTHYROIDISM 02/12/2008   Qualifier: Diagnosis of  By: Maris Berger    Impaired glucose tolerance 08/27/2011   Left hemiparesis (HCC)    OSTEOPENIA 02/12/2008   Qualifier: Diagnosis of  By: Jonny Ruiz MD, Len Blalock    VITAMIN D DEFICIENCY 04/14/2010   Qualifier: Diagnosis of  By: Jonny Ruiz MD, Len Blalock     Past Surgical History:  Procedure Laterality Date   IR RADIOLOGIST EVAL & MGMT  08/18/2021   LEFT HEART CATH AND CORONARY ANGIOGRAPHY N/A 08/03/2023   Procedure: LEFT HEART CATH AND CORONARY ANGIOGRAPHY;  Surgeon: Orbie Pyo, MD;  Location: [facility] IN[facility];  Service: Cardiovascular;  Laterality: N/A;    Family History  Problem Relation Age of Onset   Heart disease Father     Bipolar disorder Sister    Diabetes Neg Hx     Social History   Socioeconomic History   Marital status: Married    Spouse name: Not on file   Number of children: 0   Years of education: Not on file   Highest education level: Not on file  Occupational History   Occupation: disabled  Tobacco Use   Smoking status: Former    Current packs/day: 0.00    Types: Cigarettes    Quit date: 01/26/2021    Years since quitting: 2.5   Smokeless tobacco: Never  Vaping Use   Vaping status: Never Used  Substance and Sexual Activity   Alcohol use: Not Currently   Drug use: No   Sexual activity: Not on file  Other Topics Concern   Not on file  Social History Narrative   Not on file   Social Determinants of Health   Financial Resource Strain: Low Risk  (04/27/2023)   Overall Financial Resource Strain (CARDIA)    Difficulty of Paying Living Expenses: Not hard at all  Food Insecurity: No Food Insecurity (08/03/2023)   Hunger Vital Sign    Worried About Running Out of Food in the Last Year: Never true    Ran Out of Food in the Last Year: Never true  Transportation Needs: No Transportation Needs (08/03/2023)   PRAPARE - Administrator, Civil Service (Medical): No    Lack of Transportation (Non-Medical): No  Physical Activity: Inactive (04/27/2023)  Exercise Vital Sign    Days of Exercise per Week: 0 days    Minutes of Exercise per Session: 0 min  Stress: Stress Concern Present (04/27/2023)   [employer] of Occupational Health - Occupational Stress Questionnaire    Feeling of Stress : To some extent  Social Connections: Socially Isolated (04/27/2023)   Social Connection and Isolation Panel [NHANES]    Frequency of Communication with Friends and Family: Never    Frequency of Social Gatherings with Friends and Family: Never    Attends Religious Services: Never    Database administrator or Organizations: No    Attends Banker Meetings: Never    Marital  Status: Married  Catering manager Violence: Not At Risk (08/03/2023)   Humiliation, Afraid, Rape, and Kick questionnaire    Fear of Current or Ex-Partner: No    Emotionally Abused: No    Physically Abused: No    Sexually Abused: No    Allergies  Allergen Reactions   Aleve [Naproxen] Nausea Only   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   Influenza Vac Split Quad     Fatigue, mimics a "super bad flu"    Current Facility-Administered Medications  Medication Dose Route Frequency Provider Last Rate Last Admin   0.9 %  sodium chloride infusion   Intravenous Continuous Orbie Pyo, MD 75 mL/hr at 08/03/23 1629 New Bag at 08/03/23 1629   0.9 %  sodium chloride infusion  250 mL Intravenous PRN Orbie Pyo, MD       acetaminophen (TYLENOL) tablet 650 mg  650 mg Oral Q4H PRN Orbie Pyo, MD       alum & mag hydroxide-simeth (MAALOX/MYLANTA) 200-200-20 MG/5ML suspension 30 mL  30 mL Oral Q6H PRN Gery Pray, MD   30 mL at 08/03/23 0981   aspirin EC tablet 81 mg  81 mg Oral Daily Zigmund Daniel., MD   81 mg at 08/03/23 1153   [START ON 08/04/2023] clopidogrel (PLAVIX) tablet 75 mg  75 mg Oral Daily Orbie Pyo, MD       divalproex (DEPAKOTE) DR tablet 250 mg  250 mg Oral Daily Adefeso, Oladapo, DO   250 mg at 08/03/23 0844   And   divalproex (DEPAKOTE) DR tablet 500 mg  500 mg Oral QHS Adefeso, Oladapo, DO   500 mg at 08/03/23 0131   hydrALAZINE (APRESOLINE) injection 10 mg  10 mg Intravenous Q20 Min PRN Orbie Pyo, MD       HYDROmorphone (DILAUDID) injection 0.5 mg  0.5 mg Intravenous Q3H PRN Gery Pray, MD       [START ON 08/04/2023] isosorbide mononitrate (IMDUR) 24 hr tablet 90 mg  90 mg Oral Daily Chandrasekhar, Mahesh A, MD       labetalol (NORMODYNE) injection 10 mg  10 mg Intravenous  Q10 min PRN Orbie Pyo, MD       levothyroxine (SYNTHROID) tablet 75 mcg  75 mcg Oral Daily Adefeso, Oladapo, DO   75 mcg at 08/03/23 0520   memantine (NAMENDA) tablet 5 mg  5 mg Oral BID Adefeso, Oladapo, DO   5 mg at 08/03/23 0843   morphine (PF) 2 MG/ML injection 1 mg  1 mg Intravenous Q2H PRN Azalee Course, PA   1 mg at 08/03/23 2004   nitroGLYCERIN (NITROSTAT)  SL tablet 0.4 mg  0.4 mg Sublingual Q5 min PRN Zigmund Daniel., MD       ondansetron [facility]) tablet 4 mg  4 mg Oral Q6H PRN Adefeso, Oladapo, DO       Or   ondansetron (ZOFRAN) injection 4 mg  4 mg Intravenous Q6H PRN Adefeso, Oladapo, DO       pantoprazole (PROTONIX) EC tablet 40 mg  40 mg Oral Daily Adefeso, Oladapo, DO   40 mg at 08/03/23 0843   PARoxetine (PAXIL) tablet 40 mg  40 mg Oral q morning Adefeso, Oladapo, DO   40 mg at 08/03/23 0843   rosuvastatin (CRESTOR) tablet 20 mg  20 mg Oral QHS Zigmund Daniel., MD       sodium chloride flush (NS) 0.9 % injection 3 mL  3 mL Intravenous Q12H Orbie Pyo, MD       sodium chloride flush (NS) 0.9 % injection 3 mL  3 mL Intravenous PRN Orbie Pyo, MD        PHYSICAL EXAM Vitals:   08/03/23 1554 08/03/23 1559 08/03/23 1604 08/03/23 1804  BP: 99/64 109/63 103/72   Pulse: 91 91 (!) 0   Resp: 16 15  16   Temp:      TempSrc:      SpO2: 96% 96%    Weight:      Height:       Elderly woman in no distress Regular rate and rhythm Unlabored breathing Ecchymosis about the right volar forearm No mass.   Mild swelling in the right forearm without evidence of compartment syndrome. 2+ radial pulse on the right Hand is warm and well-perfused  PERTINENT LABORATORY AND RADIOLOGIC DATA  Most recent CBC    Latest Ref Rng & Units 08/03/2023    6:17 AM 08/02/2023    6:05 PM 05/18/2023    5:22 AM  CBC  WBC 4.0 - 10.5 K/uL 7.7  8.3  7.6   Hemoglobin 12.0 - 15.0 g/dL 16.1  09.6  04.5   Hematocrit 36.0 - 46.0 % 43.8  46.3  36.9   Platelets 150 - 400 K/uL 237   ACLMP  204      Most recent CMP    Latest Ref Rng & Units 08/03/2023    6:17 AM 08/02/2023    6:05 PM 05/18/2023    5:22 AM  CMP  Glucose 70 - 99 mg/dL 409  811  914   BUN 8 - 23 mg/dL 14  19  19    Creatinine 0.44 - 1.00 mg/dL 7.82  9.56  2.13   Sodium 135 - 145 mmol/L 138  144  140   Potassium 3.5 - 5.1 mmol/L 4.7  4.0  4.1   Chloride 98 - 111 mmol/L 103  106  105   CO2 22 - 32 mmol/L 25  27  26    Calcium 8.9 - 10.3 mg/dL 8.5  8.8  8.3   Total Protein 6.5 - 8.1 g/dL 6.2     Total Bilirubin 0.3 - 1.2 mg/dL 0.8     Alkaline Phos 38 - 126 U/L 38     AST 15 - 41 U/L 40     ALT 0 - 44 U/L 14       Renal function Estimated Creatinine Clearance: 53.8 mL/min (by C-G formula based on SCr of 0.68 mg/dL).  Hgb A1c MFr Bld (%)  Date Value  04/10/2023 6.3    LDL Cholesterol  Date Value Ref Range  Status  08/03/2023 89 0 - 99 mg/dL Final    Comment:           Total Cholesterol/HDL:CHD Risk Coronary Heart Disease Risk Table                     Men   Women  1/2 Average Risk   3.4   3.3  Average Risk       5.0   4.4  2 X Average Risk   9.6   7.1  3 X Average Risk  23.4   11.0        Use the calculated Patient Ratio above and the CHD Risk Table to determine the patient's CHD Risk.        ATP III CLASSIFICATION (LDL):  <100     mg/dL   Optimal  161-096  mg/dL   Near or Above                    Optimal  130-159  mg/dL   Borderline  045-409  mg/dL   High  >811     mg/dL   Very High Performed at [facility], [address], [place], [place] [address]    Direct LDL  Date Value Ref Range Status  10/19/2012 137.5 mg/dL Final    Comment:    Optimal:  <100 mg/dLNear or Above Optimal:  100-129 mg/dLBorderline High:  130-159 mg/dLHigh:  160-189 mg/dLVery High:  >190 mg/dL    Rande Brunt. Lenell Antu, MD FACS [facility] Phone Number: [phone] 08/03/2023 8:40 PM   Total time spent on preparing this encounter including chart review,  data review, collecting history, examining the patient, coordinating care for this new patient, 60 minutes.  Portions of this report may have been transcribed using voice recognition software.  Every effort has been made to ensure accuracy; however, inadvertent computerized transcription errors may still be present.

## 2023-08-03 NOTE — Progress Notes (Signed)
Asked by cath lab staff to check on the patient's wrist. Mrs. Lauterbach is accompanied by her husband, she has moderate to severe dementia with significant short-term memory issue. She was admitted for NSTEMI, underwent cath this afternoon. After TR band was removed from her right radial cath site, she developed a large hematoma that could not be reduced. On exam, the right forearm hematoma is now covers 2/3 of the forearm. BP still able at 123/73. Patient is not a good historian given underlying dementia, at one point she says she has moderate pain, on repeat questioning mentioned 9/10 pain. A BP cuff was inflated over the hematoma. The hematoma shows clear border, palpable distal radial pulse. Patient does complains mild numbness in the finger tip. I don't think there is compartment syndrome. Patient did receive a plavix loading dose during cath. She was found to have 95% mid RCA lesion treated medically due to tortuous blood vessel.  Patient examined by Dr. Thomasene Ripple. Will give some low dose morephine for pain control. Dr. Servando Salina recommend consult vascular surgery.

## 2023-08-03 NOTE — Progress Notes (Signed)
PT Cancellation Note  Patient Details Name: Susan Davidson MRN: [id number] DOB: Apr 14, 1951   Cancelled Treatment:    Reason Eval/Treat Not Completed: Medical issues which prohibited therapy. Pt with chest pain and elevated troponin levels this am. Will hold eval per PA and will continue to follow.   Hilton Cork, PT, DPT Secure Chat Preferred  Rehab Office [phone]   Arturo Morton Brion Aliment 08/03/2023, 10:40 AM

## 2023-08-03 NOTE — Progress Notes (Addendum)
Patient Name: Susan Davidson Date of Encounter: 08/03/2023 [facility] Cardiologist: New this admission    Interval Summary  .    Patient alert and oriented to person, time. Thinks that the month is September. Able to tell me that Susan Davidson is in the hospital because Susan Davidson was worried about Susan Davidson heart. Initially denied chest pain, shortness of breath. Later in the visit, Susan Davidson reported having 2/10 chest pain that felt like a dull ache.   Vital Signs .    Vitals:   08/02/23 2012 08/02/23 2226 08/03/23 0450 08/03/23 0454  BP: 127/61 (!) 153/92 108/74 115/76  Pulse: 77 73    Resp: 16     Temp:  99.5 F (37.5 C)    TempSrc:  Oral    SpO2: 96% 96%    Weight:      Height:        Intake/Output Summary (Last 24 hours) at 08/03/2023 0754 Last data filed at 08/03/2023 0600 Gross per 24 hour  Intake 117.29 ml  Output --  Net 117.29 ml      08/02/2023    4:39 PM 07/31/2023    9:15 AM 06/16/2023    9:35 AM  Last 3 Weights  Weight (lbs) 138 lb 14.2 oz 140 lb 140 lb  Weight (kg) 63 kg 63.504 kg 63.504 kg      Telemetry/ECG    NSR - Personally Reviewed  Physical Exam .   GEN: No acute distress.  Laying comfortably in the bed  Neck: No JVD Cardiac: RRR, no murmurs, rubs, or gallops. Radial pulses 2+ bilaterally  Respiratory: Breathing unlabored  GI: Soft, nontender, non-distended  MS: No edema in BLE   Assessment & Plan .     NSTEMI  - Patient presented with acute onset substernal chest pain. Pain had resolved by the time Susan Davidson arrived in the ED  - hsTn 2,035>2,056  - EKG on arrival showed NSR, q-waves in the inferior leads (new)  - Echocardiogram ordered and pending  - Continue IV heparin  - Continue plavix (on for history of CVA), crestor  - Patient expressed desire to avoid procedures if possible. With Susan Davidson history of dementia, I think it would be beneficial to discuss further when Susan Davidson Susan Davidson/family is present. Hopefully, echo will be completed by that time and we  will be able to determine extent of myocardial injury and use that to guide treatment  - Susan Davidson is currently reporting mild 2/10 chest pain.  Ordered nitroglycerin ointment. Stopped sl nitroglycerin   Dementia  - Patient primarily bed-bound for about 80% of the day. Susan Davidson Susan Davidson is Susan Davidson primary caretaker. Susan Davidson is able to walk with a cane/walker - On memantine   Otherwise per primary  - Hypothyroidism  - Depression  - History of dysphagia    For questions or updates, please contact [facility] Please consult [email or web address] for contact info under        Signed, Jonita Albee, PA-C   Personally seen and examined. 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 Susan Davidson was able to perform basic ADLs and ambulate with the aid of a cane or walker, but remained largely bedbound for most of the day. Susan Davidson care was primarily supported by Susan Davidson spouse.  In the past, there were  concerns about a cryptogenic stroke, but Susan Davidson was unable to use a Holter monitor for the evaluation of paroxysmal atrial fibrillation. Susan Davidson had no history of significant falls.  Susan Davidson presentation included anginal chest pain, a normal creatinine level despite the history of CKD, and a high sensitivity troponin of 2035-2056. Susan Davidson also had new Q waves in the inferior leads.  Despite Susan Davidson condition, Susan Davidson expressed a preference for a conservative, non-procedural intervention for Susan Davidson NSTEMI.  Susan Davidson has asked me for Vicodin a few times.  Susan Davidson would specifically like a big orange pill  Susan Davidson was previously on a nitro patch for pain, which Susan Davidson expressed a preference to transition to an oral medication.   Susan Davidson spouse notes that he has a health aid that watches Susan Davidson while he works.  He notes Susan Davidson does basic ADLs.   He notes Susan Davidson has chronic pain syndrome and that he is constantly needing to massage something for Susan Davidson, but Susan Davidson presentation started when Susan Davidson told Susan Davidson aide that Susan Davidson felt like Susan Davidson was having a heart attack.  He notes after Susan Davidson stroke Susan Davidson was on DAPT, but given a A 5 mm anterior communicating artery aneurysm incidentally found, Susan Davidson neurologist planned to minimize DAPT usage. Susan Davidson notes both chest pain and stomach pain on my exam.  Exam notable for elderly female in NAD.  Rest of exam as above  Labs notable for troponnin 2035-> 2056 Tele: SR  Would recommend   Non-ST Elevation Myocardial Infarction (NSTEMI) Presented with anginal chest pain, elevated troponin, and new Q waves in inferior leads. Discussed the risks/benefits of invasive vs conservative management at length. Given Susan Davidson persistent pain, Susan Davidson amenable to cath -Continue Heparin and Aspirin and plavix -Transition from nitro patch to isosorbide mononitrate. - echo is pending  Risks and benefits of cardiac catheterization have been discussed with the patient.  These include bleeding, infection, kidney damage, stroke, heart attack, death.  The patient understands these risks and is willing to proceed.  Access recommendations: R radial Procedural considerations: Not a candidate for CABG, neurology has asked for minimal DAPT due to a 5 mm anterior communicating artery aneurysm    Chronic Kidney Disease (CKD) Stage 3 Baseline creatinine normal on presentation. -Continue current management.  Hypertension and Hyperlipidemia Risk factors for coronary artery disease. -Continue current management with anginal therapy  Cryptogenic Stroke No evidence of significant falls or paroxysmal atrial fibrillation. -Continue current management.  [name], MD FASE [facility] Cardiologist [facility]  [address] [place], [address] [place], [place] [address] [phone]  12:17 PM

## 2023-08-03 NOTE — Progress Notes (Signed)
*  PRELIMINARY RESULTS* Echocardiogram 2D Echocardiogram has been performed.  Susan Davidson 08/03/2023, 12:04 PM

## 2023-08-03 NOTE — H&P (View-Only) (Signed)
Patient Name: Susan Davidson Date of Encounter: 08/03/2023 [facility] Cardiologist: New this admission    Interval Summary  .    Patient alert and oriented to person, time. Thinks that the month is September. Able to tell me that she is in the hospital because she was worried about her heart. Initially denied chest pain, shortness of breath. Later in the visit, she reported having 2/10 chest pain that felt like a dull ache.   Vital Signs .    Vitals:   08/02/23 2012 08/02/23 2226 08/03/23 0450 08/03/23 0454  BP: 127/61 (!) 153/92 108/74 115/76  Pulse: 77 73    Resp: 16     Temp:  99.5 F (37.5 C)    TempSrc:  Oral    SpO2: 96% 96%    Weight:      Height:        Intake/Output Summary (Last 24 hours) at 08/03/2023 0754 Last data filed at 08/03/2023 0600 Gross per 24 hour  Intake 117.29 ml  Output --  Net 117.29 ml      08/02/2023    4:39 PM 07/31/2023    9:15 AM 06/16/2023    9:35 AM  Last 3 Weights  Weight (lbs) 138 lb 14.2 oz 140 lb 140 lb  Weight (kg) 63 kg 63.504 kg 63.504 kg      Telemetry/ECG    NSR - Personally Reviewed  Physical Exam .   GEN: No acute distress.  Laying comfortably in the bed  Neck: No JVD Cardiac: RRR, no murmurs, rubs, or gallops. Radial pulses 2+ bilaterally  Respiratory: Breathing unlabored  GI: Soft, nontender, non-distended  MS: No edema in BLE   Assessment & Plan .     NSTEMI  - Patient presented with acute onset substernal chest pain. Pain had resolved by the time she arrived in the ED  - hsTn 2,035>2,056  - EKG on arrival showed NSR, q-waves in the inferior leads (new)  - Echocardiogram ordered and pending  - Continue IV heparin  - Continue plavix (on for history of CVA), crestor  - Patient expressed desire to avoid procedures if possible. With her history of dementia, I think it would be beneficial to discuss further when her husband/family is present. Hopefully, echo will be completed by that time and we  will be able to determine extent of myocardial injury and use that to guide treatment  - She is currently reporting mild 2/10 chest pain.  Ordered nitroglycerin ointment. Stopped sl nitroglycerin   Dementia  - Patient primarily bed-bound for about 80% of the day. Her husband is her primary caretaker. She is able to walk with a cane/walker - On memantine   Otherwise per primary  - Hypothyroidism  - Depression  - History of dysphagia    For questions or updates, please contact [facility] Please consult [email or web address] for contact info under        Signed, Jonita Albee, PA-C   Personally seen and examined. 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 She was able to perform basic ADLs and ambulate with the aid of a cane or walker, but remained largely bedbound for most of the day. Her care was primarily supported by her spouse.  In the past, there were  concerns about a cryptogenic stroke, but she was unable to use a Holter monitor for the evaluation of paroxysmal atrial fibrillation. She had no history of significant falls.  Her presentation included anginal chest pain, a normal creatinine level despite the history of CKD, and a high sensitivity troponin of 2035-2056. She also had new Q waves in the inferior leads.  Despite her condition, she expressed a preference for a conservative, non-procedural intervention for her NSTEMI.  She has asked me for Vicodin a few times.  She would specifically like a big orange pill  She was previously on a nitro patch for pain, which she expressed a preference to transition to an oral medication.   Her spouse notes that he has a health aid that watches her while he works.  He notes she does basic ADLs.   He notes she has chronic pain syndrome and that he is constantly needing to massage something for her, but her presentation started when she told her aide that she felt like she was having a heart attack.  He notes after her stroke she was on DAPT, but given a A 5 mm anterior communicating artery aneurysm incidentally found, her neurologist planned to minimize DAPT usage. She notes both chest pain and stomach pain on my exam.  Exam notable for elderly female in NAD.  Rest of exam as above  Labs notable for troponnin 2035-> 2056 Tele: SR  Would recommend   Non-ST Elevation Myocardial Infarction (NSTEMI) Presented with anginal chest pain, elevated troponin, and new Q waves in inferior leads. Discussed the risks/benefits of invasive vs conservative management at length. Given her persistent pain, husband amenable to cath -Continue Heparin and Aspirin and plavix -Transition from nitro patch to isosorbide mononitrate. - echo is pending  Risks and benefits of cardiac catheterization have been discussed with the patient.  These include bleeding, infection, kidney damage, stroke, heart attack, death.  The patient understands these risks and is willing to proceed.  Access recommendations: R radial Procedural considerations: Not a candidate for CABG, neurology has asked for minimal DAPT due to a 5 mm anterior communicating artery aneurysm    Chronic Kidney Disease (CKD) Stage 3 Baseline creatinine normal on presentation. -Continue current management.  Hypertension and Hyperlipidemia Risk factors for coronary artery disease. -Continue current management with anginal therapy  Cryptogenic Stroke No evidence of significant falls or paroxysmal atrial fibrillation. -Continue current management.  Susan Lam, MD FASE [facility] Cardiologist [facility]  [address] [place], [address] [place], [place] [address] [phone]  12:17 PM

## 2023-08-03 NOTE — Progress Notes (Signed)
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   Influenza Vac Split Quad     Fatigue, mimics a "super bad flu"    Patient Measurements: Height: 5\' 2"  (157.5 cm) Weight: 63 kg (138 lb 14.2 oz) IBW/kg (Calculated) : 50.1 Heparin Dosing Weight: 63 kg   Vital Signs: Temp: 99.5 F (37.5 C) (10/30 2226) Temp Source: Oral (10/30 2226) BP: 115/76 (10/31 0454) Pulse Rate: 73 (10/30 2226)  Labs: Recent Labs    08/02/23 1805 08/02/23 1949 08/03/23 0604 08/03/23 0617  HGB 14.7  --   --  14.1  HCT 46.3*  --   --  43.8  PLT ACLMP  --   --  237  APTT  --  24  --   --   LABPROT  --  12.0  --   --   INR  --  0.9  --   --   HEPARINUNFRC  --   --  1.02*  --   CREATININE 0.76  --   --   --   TROPONINIHS 2,035* 2,056*  --   --     Estimated Creatinine Clearance: 53.9 mL/min (by C-G formula based on SCr of 0.76 mg/dL).   Medical History: Past Medical History:  Diagnosis Date   ANXIETY 02/12/2008   Qualifier: Diagnosis of  By: Jonny Ruiz MD, Len Blalock    CVA (cerebral vascular accident) [facility])    DEPRESSION 02/12/2008   Qualifier: Diagnosis of  By: Maris Berger    HYPERLIPIDEMIA 02/12/2008   Qualifier: Diagnosis of  By: Maris Berger    HYPERTENSION 02/12/2008   Qualifier: Diagnosis of  By: Maris Berger    HYPOTHYROIDISM 02/12/2008   Qualifier: Diagnosis of  By: Maris Berger    Impaired glucose tolerance 08/27/2011   Left hemiparesis (HCC)    OSTEOPENIA 02/12/2008   Qualifier: Diagnosis of  By: Jonny Ruiz MD, Len Blalock    VITAMIN D DEFICIENCY 04/14/2010   Qualifier: Diagnosis of  By: Jonny Ruiz MD, Len Blalock     Medications:   Infusions:   heparin 800 Units/hr (08/03/23 0600)    Assessment: 72 yo F to start heparin drip for ACS/NSTEMI with chest pain, elevated troponin. Prior hx of stroke, CKD, HTN, HLD On Plavix PTA per med rec and review of dispense hx.  Initial heparin level 1.02 on 800 units/hr, phlebotomy couldn't confirm that it was drawn from opposite arm.  Repeat level >1.10.  No bleeding noted.  CBC stable (Hgb 14.1, pltc 237).  Goal of Therapy:  Heparin level 0.3-0.7 units/ml Monitor platelets by anticoagulation protocol: Yes   Plan:  HOLD heparin x1 hour Restart heparin infusion at 600 units/hr 8 hour heparin level Daily CBC, heparin level  Trixie Rude, PharmD Clinical Pharmacist 08/03/2023  11:26 AM

## 2023-08-04 ENCOUNTER — Inpatient Hospital Stay (HOSPITAL_COMMUNITY): Payer: Medicare HMO

## 2023-08-04 DIAGNOSIS — T1490XA Injury, unspecified, initial encounter: Secondary | ICD-10-CM | POA: Diagnosis not present

## 2023-08-04 DIAGNOSIS — I214 Non-ST elevation (NSTEMI) myocardial infarction: Secondary | ICD-10-CM | POA: Diagnosis not present

## 2023-08-04 LAB — CBC
HCT: 38.7 % (ref 36.0–46.0)
Hemoglobin: 12.6 g/dL (ref 12.0–15.0)
MCH: 33.7 pg (ref 26.0–34.0)
MCHC: 32.6 g/dL (ref 30.0–36.0)
MCV: 103.5 fL — ABNORMAL HIGH (ref 80.0–100.0)
Platelets: 224 10*3/uL (ref 150–400)
RBC: 3.74 MIL/uL — ABNORMAL LOW (ref 3.87–5.11)
RDW: 15.5 % (ref 11.5–15.5)
WBC: 6.3 10*3/uL (ref 4.0–10.5)
nRBC: 0 % (ref 0.0–0.2)

## 2023-08-04 LAB — BASIC METABOLIC PANEL
Anion gap: 9 (ref 5–15)
BUN: 12 mg/dL (ref 8–23)
CO2: 23 mmol/L (ref 22–32)
Calcium: 8.3 mg/dL — ABNORMAL LOW (ref 8.9–10.3)
Chloride: 109 mmol/L (ref 98–111)
Creatinine, Ser: 0.98 mg/dL (ref 0.44–1.00)
GFR, Estimated: >60 mL/min (ref >60)
Glucose, Bld: 119 mg/dL — ABNORMAL HIGH (ref 70–99)
Potassium: 4.5 mmol/L (ref 3.5–5.1)
Sodium: 141 mmol/L (ref 135–145)

## 2023-08-04 MED ORDER — METOPROLOL TARTRATE 12.5 MG HALF TABLET
12.5000 mg | ORAL_TABLET | Freq: Two times a day (BID) | ORAL | Status: DC
  Administered 2023-08-04 – 2023-08-05 (×3): 12.5 mg via ORAL
  Filled 2023-08-04 (×3): qty 1

## 2023-08-04 NOTE — Progress Notes (Signed)
PROGRESS NOTE    Susan Davidson  UEA:[id number] DOB: Jun 02, 1951 DOA: 08/02/2023 PCP: Corwin Levins, MD  No chief complaint on file.   Brief Narrative:   [name] is Susan Davidson 72 y.o. female with medical history significant of hypothyroidism, hyperlipidemia, GERD, depression, history of CVA and nonambulatory at baseline who presents to the emergency department from home via EMS due to chest pain sustained at home.   She was admitted for NSTEMI.  Transferred to [facility] from [facility].    Assessment & Plan:   Principal Problem:   NSTEMI (non-ST elevated myocardial infarction) (HCC) Active Problems:   Acquired hypothyroidism   History of stroke   Dementia without behavioral disturbance (HCC)   History of dysphagia   Elevated MCV  NSTEMI Troponin has peaked at 2,262 Echo with EF 60-65%, no RWMA, small pericardial effusion Developed CP -> cath per cards -> Now s/p LHC, high grade mid RCA lesion -> planning medical therapy - given issues with hematoma, no plans for additional intervention Imdur, beta blocker, statin Plavix, aspirin, crestor    ACA aneurysm Noted - limit DAPT  Elevated MCV Normal folate and b12   Right Forearm Hematoma No evidence of pseudoaneurysm or AVF on RUE [place]  Acquired hypothyroidism Continue levothyroxine    History of strokes Continue Plavix and Crestor for secondary prevention   Dementia Continue supportive care and Namenda   Depression Continue Paxil, Depakote   History of dysphagia/FEN Patient has Gianluca Chhim history of poor oral intake with concerns for aspiration Dysphagia 3 diet was recommended during last admission SLP      DVT prophylaxis: heparin gtt Code Status: full Family Communication: husband at bedside Disposition:   Status is: Inpatient Remains inpatient appropriate because: continued need for inpatient care   Consultants:  cardiology  Procedures:    Mid RCA lesion is 95% stenosed.   1.  High-grade mid right coronary artery lesion.   After review with Dr. Izora Ribas and given its tortuosity and calcification in the context of the patient's comorbidities we have elected to pursue medical therapy.  If the patient is anginal symptoms cannot be controlled PCI can be considered. 2.  Mild disease of small caliber LAD and left circumflex. 3.  LVEDP of 10 mmHg:   Summary: Medical therapy for acute coronary syndrome.  Felipe Cabell Plavix load was ordered.  If the patient's chest pain cannot be controlled with medical therapy PCI can be considered.  Antimicrobials:  Anti-infectives (From admission, onward)    None       Subjective: No complaints  Objective: Vitals:   08/03/23 1559 08/03/23 1604 08/03/23 1804 08/04/23 0527  BP: 109/63 103/72  113/82  Pulse: 91 (!) 0    Resp: 15  16   Temp:    98 F (36.7 C)  TempSrc:    Oral  SpO2: 96%     Weight:      Height:        Intake/Output Summary (Last 24 hours) at 08/04/2023 1607 Last data filed at 08/03/2023 1624 Gross per 24 hour  Intake 20.4 ml  Output --  Net 20.4 ml   Filed Weights   08/02/23 1639 08/03/23 1158  Weight: 63 kg 62.8 kg    Examination:  General: No acute distress. Cardiovascular: Heart sounds show Dazhane Villagomez regular rate, and rhythm.  Lungs: Clear to auscultation bilaterally Abdomen: Soft, nontender, nondistended Neurological: Alert. Moves all extremities 4 with equal strength. Cranial nerves II through XII grossly intact. Skin: Warm and dry. No rashes  or lesions. Extremities: bruising to R forearm, no LEE  Data Reviewed: I have personally reviewed following labs and imaging studies  CBC: Recent Labs  Lab 08/02/23 1805 08/03/23 0617 08/04/23 0635  WBC 8.3 7.7 6.3  NEUTROABS 5.9  --   --   HGB 14.7 14.1 12.6  HCT 46.3* 43.8 38.7  MCV 107.4* 103.8* 103.5*  PLT ACLMP 237 224    Basic Metabolic Panel: Recent Labs  Lab 08/02/23 1805 08/03/23 0617 08/04/23 0635  NA 144 138 141  K 4.0 4.7 4.5  CL 106 103 109  CO2 27 25 23   GLUCOSE 120*  101* 119*  BUN 19 14 12   CREATININE 0.76 0.68 0.98  CALCIUM 8.8* 8.5* 8.3*  MG  --  2.3  --   PHOS  --  2.6  --     GFR: Estimated Creatinine Clearance: 43.9 mL/min (by C-G formula based on SCr of 0.98 mg/dL).  Liver Function Tests: Recent Labs  Lab 08/03/23 0617  AST 40  ALT 14  ALKPHOS 38  BILITOT 0.8  PROT 6.2*  ALBUMIN 3.1*    CBG: No results for input(s): "GLUCAP" in the last 168 hours.   No results found for this or any previous visit (from the past 240 hour(s)).       Radiology Studies: VAS [place] UPPER EXTREMITY ARTERIAL DUPLEX  Result Date: 08/04/2023  UPPER EXTREMITY DUPLEX STUDY Patient Name:  Susan Davidson  Date of Exam:   08/04/2023 Medical Rec #: [id number]        Accession #:    [id number] Date of Birth: 03-29-1951         Patient Gender: F Patient Age:   69 years Exam Location:  [facility] Procedure:      VAS [place] UPPER EXTREMITY ARTERIAL DUPLEX Referring Phys: CALLIE GOODRICH --------------------------------------------------------------------------------  Indications: Bruising and Rule out pseudoaneurysm, rule hematoma. History:     Patient has Chidera Dearcos history of catheterization via right radial artery.  Performing Technologist: Fernande Bras  Examination Guidelines: Timberlyn Pickford complete evaluation includes B-mode imaging, spectral Doppler, color Doppler, and power Doppler as needed of all accessible portions of each vessel. Bilateral testing is considered an integral part of Abygale Karpf complete examination. Limited examinations for reoccurring indications may be performed as noted.  Right Doppler Findings: +-------------+----------+--------+--------+--------+ Site         PSV (cm/s)WaveformStenosisComments +-------------+----------+--------+--------+--------+ Brachial Dist81                                 +-------------+----------+--------+--------+--------+ Radial Mid   62                                 +-------------+----------+--------+--------+--------+  Radial Dist  55                                 +-------------+----------+--------+--------+--------+ Ulnar Dist   57                                 +-------------+----------+--------+--------+--------+    Summary:  Right: No evidence of pseudoaneurysm or AVF. Vein patent. *See table(s) above for measurements and observations.    Preliminary    CARDIAC CATHETERIZATION  Result Date: 08/03/2023   Mid RCA lesion is 95% stenosed. 1.  High-grade mid right coronary artery lesion.  After review with Dr. Izora Ribas and given its tortuosity and calcification in the context of the patient's comorbidities we have elected to pursue medical therapy.  If the patient is anginal symptoms cannot be controlled PCI can be considered. 2.  Mild disease of small caliber LAD and left circumflex. 3.  LVEDP of 10 mmHg: Summary: Medical therapy for acute coronary syndrome.  Kairav Russomanno Plavix load was ordered.  If the patient's chest pain cannot be controlled with medical therapy PCI can be considered.   ECHOCARDIOGRAM COMPLETE  Result Date: 08/03/2023    ECHOCARDIOGRAM REPORT   Patient Name:   REBBIE LAURICELLA Date of Exam: 08/03/2023 Medical Rec #:  [id number]       Height:       62.0 in Accession #:    [id number]      Weight:       138.9 lb Date of Birth:  19-Oct-1950        BSA:          1.637 m Patient Age:    72 years        BP:           133/90 mmHg Patient Gender: F               HR:           81 bpm. Exam Location:  Inpatient Procedure: 2D Echo, Cardiac Doppler and Color Doppler Indications:    NSTEMI  History:        Patient has prior history of Echocardiogram examinations, most                 recent 07/04/2021. Stroke and CKD; Risk Factors:Hypertension,                 Dyslipidemia and Former Smoker.  Sonographer:    Dondra Prader RVT RCS Referring Phys: [id number] DAMARCUS Alcie Runions INGRAM  Sonographer Comments: Technically challenging study due to limited acoustic windows, Technically difficult study due to poor echo windows,  suboptimal parasternal window, suboptimal apical window and suboptimal subcostal window. Image acquisition challenging due to uncooperative patient and Image acquisition challenging due to respiratory motion. IMPRESSIONS  1. Left ventricular ejection fraction, by estimation, is 60 to 65%. The left ventricle has normal function. The left ventricle has no regional wall motion abnormalities. Left ventricular diastolic parameters are consistent with Grade I diastolic dysfunction (impaired relaxation).  2. Right ventricular systolic function is normal. The right ventricular size is normal. Tricuspid regurgitation signal is inadequate for assessing PA pressure.  3. Graceland Wachter small pericardial effusion is present.  4. The mitral valve is normal in structure. No evidence of mitral valve regurgitation.  5. The aortic valve was not well visualized. Aortic valve regurgitation is not visualized.  6. Aortic not well visualized.  7. The inferior vena cava is normal in size with greater than 50% respiratory variability, suggesting right atrial pressure of 3 mmHg. FINDINGS  Left Ventricle: Left ventricular ejection fraction, by estimation, is 60 to 65%. The left ventricle has normal function. The left ventricle has no regional wall motion abnormalities. The left ventricular internal cavity size was normal in size. There is  no left ventricular hypertrophy. Left ventricular diastolic parameters are consistent with Grade I diastolic dysfunction (impaired relaxation). Right Ventricle: The right ventricular size is normal. Right ventricular systolic function is normal. Tricuspid regurgitation signal is inadequate for assessing PA pressure. Left Atrium: Left atrial size was normal in size. Right Atrium:  Right atrial size was normal in size. Pericardium: Swayzee Wadley small pericardial effusion is present. Mitral Valve: The mitral valve is normal in structure. No evidence of mitral valve regurgitation. Tricuspid Valve: Tricuspid valve regurgitation is not  demonstrated. Aortic Valve: The aortic valve was not well visualized. Aortic valve regurgitation is not visualized. Aortic valve mean gradient measures 1.0 mmHg. Aortic valve peak gradient measures 2.2 mmHg. Aortic valve area, by VTI measures 1.98 cm. Pulmonic Valve: The pulmonic valve was not well visualized. Pulmonic valve regurgitation is not visualized. Aorta: Not well visualized. Venous: The inferior vena cava is normal in size with greater than 50% respiratory variability, suggesting right atrial pressure of 3 mmHg. IAS/Shunts: The interatrial septum was not well visualized.  LEFT VENTRICLE PLAX 2D LVIDd:         3.70 cm   Diastology LVIDs:         1.90 cm   LV e' medial:    5.26 cm/s LV PW:         0.90 cm   LV E/e' medial:  11.0 LV IVS:        0.90 cm   LV e' lateral:   6.57 cm/s LVOT diam:     1.60 cm   LV E/e' lateral: 8.8 LV SV:         27 LV SV Index:   17 LVOT Area:     2.01 cm  RIGHT VENTRICLE             IVC RV S prime:     10.30 cm/s  IVC diam: 1.30 cm LEFT ATRIUM           Index LA Vol (A4C): 14.0 ml 8.55 ml/m  AORTIC VALVE                    PULMONIC VALVE AV Area (Vmax):    2.06 cm     PV Vmax:       1.17 m/s AV Area (Vmean):   2.00 cm     PV Peak grad:  5.5 mmHg AV Area (VTI):     1.98 cm AV Vmax:           74.37 cm/s AV Vmean:          49.967 cm/s AV VTI:            0.138 m AV Peak Grad:      2.2 mmHg AV Mean Grad:      1.0 mmHg LVOT Vmax:         76.20 cm/s LVOT Vmean:        49.600 cm/s LVOT VTI:          0.136 m LVOT/AV VTI ratio: 0.99  AORTA Ao Root diam: 2.60 cm MITRAL VALVE MV Area (PHT): 3.85 cm    SHUNTS MV Decel Time: 197 msec    Systemic VTI:  0.14 m MV E velocity: 58.10 cm/s  Systemic Diam: 1.60 cm MV Analucia Hush velocity: 96.70 cm/s MV E/Mariel Lukins ratio:  0.60 [place] signed by Carolan Clines Signature Date/Time: 08/03/2023/2:43:47 PM    Final    DG Chest Port 1 View  Result Date: 08/02/2023 CLINICAL DATA:  Former smoker.  Dull chest pain EXAM: PORTABLE CHEST 1 VIEW  COMPARISON:  None Available. FINDINGS: Normal mediastinum and cardiac silhouette. Low lung volumes. Normal pulmonary vasculature. No evidence of effusion, infiltrate, or pneumothorax. No acute bony abnormality. IMPRESSION: No active disease. Electronically Signed   By: Loura Halt.D.  On: 08/02/2023 18:51        Scheduled Meds:  aspirin EC  81 mg Oral Daily   clopidogrel  75 mg Oral Daily   divalproex  250 mg Oral Daily   And   divalproex  500 mg Oral QHS   isosorbide mononitrate  90 mg Oral Daily   levothyroxine  75 mcg Oral Daily   memantine  5 mg Oral BID   metoprolol tartrate  12.5 mg Oral BID   pantoprazole  40 mg Oral Daily   PARoxetine  40 mg Oral q morning   rosuvastatin  20 mg Oral QHS   sodium chloride flush  3 mL Intravenous Q12H   Continuous Infusions:  sodium chloride       LOS: 2 days    Time spent: over 30 min     Lacretia Nicks, MD [facility]   To contact the attending provider between 7A-7P or the covering provider during after hours 7P-7A, please log into the web site [email or web address] and access using universal [place] password for that web site. If you do not have the password, please call the hospital operator.  08/04/2023, 4:07 PM

## 2023-08-04 NOTE — Progress Notes (Signed)
No evidence of pseudoaneurysm on duplex. Recommend compression and elevation for post cath hematoma. Please call for questions.  Rande Brunt. Lenell Antu, MD [facility] Phone Number: [phone] 08/04/2023 2:44 PM

## 2023-08-04 NOTE — Progress Notes (Addendum)
Rounding Note    Patient Name: Susan Davidson Date of Encounter: 08/04/2023  [facility] Cardiologist: Christell Constant, MD   Subjective   No acute overnight events. Patient developed a significant right arm hematoma following cath yesterday. Vascular Surgery was consulted and there was not felt to be any evidence of compartment syndrome. Compression bandage was applied and is still on this morning. No complaints of chest pain or shortness of breath today. No right arm pain. She denies any tingling in fingers. Right radial pulse presents.  Inpatient Medications    Scheduled Meds:  aspirin EC  81 mg Oral Daily   clopidogrel  75 mg Oral Daily   divalproex  250 mg Oral Daily   And   divalproex  500 mg Oral QHS   isosorbide mononitrate  90 mg Oral Daily   levothyroxine  75 mcg Oral Daily   memantine  5 mg Oral BID   pantoprazole  40 mg Oral Daily   PARoxetine  40 mg Oral q morning   rosuvastatin  20 mg Oral QHS   sodium chloride flush  3 mL Intravenous Q12H   Continuous Infusions:  sodium chloride     PRN Meds: sodium chloride, acetaminophen, alum & mag hydroxide-simeth, HYDROmorphone (DILAUDID) injection, morphine injection, nitroGLYCERIN, ondansetron **OR** ondansetron (ZOFRAN) IV, sodium chloride flush   Vital Signs    Vitals:   08/03/23 1559 08/03/23 1604 08/03/23 1804 08/04/23 0527  BP: 109/63 103/72  113/82  Pulse: 91 (!) 0    Resp: 15  16   Temp:    98 F (36.7 C)  TempSrc:    Oral  SpO2: 96%     Weight:      Height:        Intake/Output Summary (Last 24 hours) at 08/04/2023 0803 Last data filed at 08/03/2023 1624 Gross per 24 hour  Intake 20.4 ml  Output --  Net 20.4 ml      08/03/2023   11:58 AM 08/02/2023    4:39 PM 07/31/2023    9:15 AM  Last 3 Weights  Weight (lbs) 138 lb 7.2 oz 138 lb 14.2 oz 140 lb  Weight (kg) 62.8 kg 63 kg 63.504 kg      Telemetry    Normal sinus rhythm with rates in the 90s. - Personally  Reviewed  ECG    No new ECG tracing today.  - Personally Reviewed  Physical Exam   GEN: No acute distress.   Neck: No JVD. Cardiac: RRR. No murmurs, rubs, or gallops.  Compression bandage on right arm. Right radial pulse present. Respiratory: Clear to auscultation bilaterally. No wheezes, rhonchi, or rales. MS: No lower extremity edema. Bilateral SCDs in place. No deformity. Skin: Warm and dry.  Neuro:  No focal deficits. Psych: Flat affect.  Labs    High Sensitivity Troponin:   Recent Labs  Lab 08/02/23 1805 08/02/23 1949 08/03/23 1515 08/03/23 1813  TROPONINIHS 2,035* 2,056* 2,262* 2,903*     Chemistry Recent Labs  Lab 08/02/23 1805 08/03/23 0617 08/04/23 0635  NA 144 138 141  K 4.0 4.7 4.5  CL 106 103 109  CO2 27 25 23   GLUCOSE 120* 101* 119*  BUN 19 14 12   CREATININE 0.76 0.68 0.98  CALCIUM 8.8* 8.5* 8.3*  MG  --  2.3  --   PROT  --  6.2*  --   ALBUMIN  --  3.1*  --   AST  --  40  --  ALT  --  14  --   ALKPHOS  --  38  --   BILITOT  --  0.8  --   GFRNONAA >60 >60 >60  ANIONGAP 11 10 9     Lipids  Recent Labs  Lab 08/03/23 1813  CHOL 146  TRIG 92  HDL 39*  LDLCALC 89  CHOLHDL 3.7    Hematology Recent Labs  Lab 08/02/23 1805 08/03/23 0617 08/04/23 0635  WBC 8.3 7.7 6.3  RBC 4.31 4.22 3.74*  HGB 14.7 14.1 12.6  HCT 46.3* 43.8 38.7  MCV 107.4* 103.8* 103.5*  MCH 34.1* 33.4 33.7  MCHC 31.7 32.2 32.6  RDW 15.8* 15.6* 15.5  PLT ACLMP 237 224   Thyroid No results for input(s): "TSH", "FREET4" in the last 168 hours.  BNP Recent Labs  Lab 08/03/23 0617  BNP 186.7*    DDimer No results for input(s): "DDIMER" in the last 168 hours.   Radiology    CARDIAC CATHETERIZATION  Result Date: 08/03/2023   Mid RCA lesion is 95% stenosed. 1.  High-grade mid right coronary artery lesion.  After review with Dr. Izora Ribas and given its tortuosity and calcification in the context of the patient's comorbidities we have elected to pursue medical  therapy.  If the patient is anginal symptoms cannot be controlled PCI can be considered. 2.  Mild disease of small caliber LAD and left circumflex. 3.  LVEDP of 10 mmHg: Summary: Medical therapy for acute coronary syndrome.  A Plavix load was ordered.  If the patient's chest pain cannot be controlled with medical therapy PCI can be considered.   ECHOCARDIOGRAM COMPLETE  Result Date: 08/03/2023    ECHOCARDIOGRAM REPORT   Patient Name:   Susan Davidson Date of Exam: 08/03/2023 Medical Rec #:  [id number]       Height:       62.0 in Accession #:    [id number]      Weight:       138.9 lb Date of Birth:  05/02/1951        BSA:          1.637 m Patient Age:    72 years        BP:           133/90 mmHg Patient Gender: F               HR:           81 bpm. Exam Location:  Inpatient Procedure: 2D Echo, Cardiac Doppler and Color Doppler Indications:    NSTEMI  History:        Patient has prior history of Echocardiogram examinations, most                 recent 07/04/2021. Stroke and CKD; Risk Factors:Hypertension,                 Dyslipidemia and Former Smoker.  Sonographer:    Dondra Prader RVT RCS Referring Phys: [id number] DAMARCUS A INGRAM  Sonographer Comments: Technically challenging study due to limited acoustic windows, Technically difficult study due to poor echo windows, suboptimal parasternal window, suboptimal apical window and suboptimal subcostal window. Image acquisition challenging due to uncooperative patient and Image acquisition challenging due to respiratory motion. IMPRESSIONS  1. Left ventricular ejection fraction, by estimation, is 60 to 65%. The left ventricle has normal function. The left ventricle has no regional wall motion abnormalities. Left ventricular diastolic parameters are consistent with Grade I diastolic dysfunction (impaired relaxation).  2. Right ventricular systolic function is normal. The right ventricular size is normal. Tricuspid regurgitation signal is inadequate for assessing PA  pressure.  3. A small pericardial effusion is present.  4. The mitral valve is normal in structure. No evidence of mitral valve regurgitation.  5. The aortic valve was not well visualized. Aortic valve regurgitation is not visualized.  6. Aortic not well visualized.  7. The inferior vena cava is normal in size with greater than 50% respiratory variability, suggesting right atrial pressure of 3 mmHg. FINDINGS  Left Ventricle: Left ventricular ejection fraction, by estimation, is 60 to 65%. The left ventricle has normal function. The left ventricle has no regional wall motion abnormalities. The left ventricular internal cavity size was normal in size. There is  no left ventricular hypertrophy. Left ventricular diastolic parameters are consistent with Grade I diastolic dysfunction (impaired relaxation). Right Ventricle: The right ventricular size is normal. Right ventricular systolic function is normal. Tricuspid regurgitation signal is inadequate for assessing PA pressure. Left Atrium: Left atrial size was normal in size. Right Atrium: Right atrial size was normal in size. Pericardium: A small pericardial effusion is present. Mitral Valve: The mitral valve is normal in structure. No evidence of mitral valve regurgitation. Tricuspid Valve: Tricuspid valve regurgitation is not demonstrated. Aortic Valve: The aortic valve was not well visualized. Aortic valve regurgitation is not visualized. Aortic valve mean gradient measures 1.0 mmHg. Aortic valve peak gradient measures 2.2 mmHg. Aortic valve area, by VTI measures 1.98 cm. Pulmonic Valve: The pulmonic valve was not well visualized. Pulmonic valve regurgitation is not visualized. Aorta: Not well visualized. Venous: The inferior vena cava is normal in size with greater than 50% respiratory variability, suggesting right atrial pressure of 3 mmHg. IAS/Shunts: The interatrial septum was not well visualized.  LEFT VENTRICLE PLAX 2D LVIDd:         3.70 cm   Diastology LVIDs:          1.90 cm   LV e' medial:    5.26 cm/s LV PW:         0.90 cm   LV E/e' medial:  11.0 LV IVS:        0.90 cm   LV e' lateral:   6.57 cm/s LVOT diam:     1.60 cm   LV E/e' lateral: 8.8 LV SV:         27 LV SV Index:   17 LVOT Area:     2.01 cm  RIGHT VENTRICLE             IVC RV S prime:     10.30 cm/s  IVC diam: 1.30 cm LEFT ATRIUM           Index LA Vol (A4C): 14.0 ml 8.55 ml/m  AORTIC VALVE                    PULMONIC VALVE AV Area (Vmax):    2.06 cm     PV Vmax:       1.17 m/s AV Area (Vmean):   2.00 cm     PV Peak grad:  5.5 mmHg AV Area (VTI):     1.98 cm AV Vmax:           74.37 cm/s AV Vmean:          49.967 cm/s AV VTI:            0.138 m AV Peak Grad:      2.2 mmHg AV  Mean Grad:      1.0 mmHg LVOT Vmax:         76.20 cm/s LVOT Vmean:        49.600 cm/s LVOT VTI:          0.136 m LVOT/AV VTI ratio: 0.99  AORTA Ao Root diam: 2.60 cm MITRAL VALVE MV Area (PHT): 3.85 cm    SHUNTS MV Decel Time: 197 msec    Systemic VTI:  0.14 m MV E velocity: 58.10 cm/s  Systemic Diam: 1.60 cm MV A velocity: 96.70 cm/s MV E/A ratio:  0.60 [place] signed by Carolan Clines Signature Date/Time: 08/03/2023/2:43:47 PM    Final    DG Chest Port 1 View  Result Date: 08/02/2023 CLINICAL DATA:  Former smoker.  Dull chest pain EXAM: PORTABLE CHEST 1 VIEW COMPARISON:  None Available. FINDINGS: Normal mediastinum and cardiac silhouette. Low lung volumes. Normal pulmonary vasculature. No evidence of effusion, infiltrate, or pneumothorax. No acute bony abnormality. IMPRESSION: No active disease. Electronically Signed   By: Genevive Bi M.D.   On: 08/02/2023 18:51    Cardiac Studies   Echocardiogram 08/03/2023: Impressions: 1. Left ventricular ejection fraction, by estimation, is 60 to 65%. The  left ventricle has normal function. The left ventricle has no regional  wall motion abnormalities. Left ventricular diastolic parameters are  consistent with Grade I diastolic  dysfunction (impaired  relaxation).   2. Right ventricular systolic function is normal. The right ventricular  size is normal. Tricuspid regurgitation signal is inadequate for assessing  PA pressure.   3. A small pericardial effusion is present.   4. The mitral valve is normal in structure. No evidence of mitral valve  regurgitation.   5. The aortic valve was not well visualized. Aortic valve regurgitation  is not visualized.   6. Aortic not well visualized.   7. The inferior vena cava is normal in size with greater than 50%  respiratory variability, suggesting right atrial pressure of 3 mmHg.  _______________  Left Cardiac Catheterization 08/03/2023:   Mid RCA lesion is 95% stenosed.   1.  High-grade mid right coronary artery lesion.  After review with Dr. Izora Ribas and given its tortuosity and calcification in the context of the patient's comorbidities we have elected to pursue medical therapy.  If the patient is anginal symptoms cannot be controlled PCI can be considered. 2.  Mild disease of small caliber LAD and left circumflex. 3.  LVEDP of 10 mmHg:   Summary: Medical therapy for acute coronary syndrome.  A Plavix load was ordered.  If the patient's chest pain cannot be controlled with medical therapy PCI can be considered.  Diagnostic Dominance: Right      Patient Profile     72 y.o. female with a history of hypertension, hyperlipidemia, CKD stage IIIa, prior CVA, and dementia who was admitted on 08/02/2023 with NSTEMI after presenting with acute onset of chest pain.   Assessment & Plan    NSTEMI Patient presented with acute onset of chest pain. High-sensitivity troponin 2,035 >> 2,056 >> 2,262 >> 2,903. Echo showed LVEF of 60-65% with no regional wall motion abnormalities and grade 1 diastolic dysfunction. LHC showed 95% stenosis of mid RCA with otherwise only minimal disease. Medical therapy was recommend given tortuosity and calcification of vessel.  - No chest pain.  - Continue Imdur  90mg  daily. - Will start Lopressor 12.5mg  twice daily. Will place hold parameters with this given soft BP at times. - Continue DAPT with Aspirin 81mg   daily and Plavix 75mg  daily.  - Continue statin. - Initial plan was to consider PCI o RCA if unable to control anginal symptoms on medications. However, given significant hematoma with diagnostic cath, will likely just need to focus on medical management.  Right Arm Hematoma Patient developed a large hematoma of right arm following cath on 08/03/2023. BP cuff was inflated over the hematoma. Vascular Surgery was consulted and there was no evidence of compartment syndrome. Hemoglobin stable. - Compression bandage in place. Right radial pulses present. Denies any tingling of hand/ fingers.  - Vascular Surgery recommended duplex ultrasound today to evaluate for pseudoaneurysm. Will order.   Hypertension BP soft at times but stable.  - Continue Imdur 90mg  daily.  - Will start Lopressor 12.5mg  twice daily. Will place hold parameters with this given soft BP at times.  Hyperlipidemia  Lipid panel this admission: Total Cholesterol 146, Triglycerides 92, HDL 39, LDL 89. LDL goal <70 given CAD.  - Home Crestor increase to 20mg  daily. - Will need repeat lipid panel and LFTs in 6-8 weeks.  CKD Stage III Baseline creatinine around 0.8 to 1.0.  - Stable at 0.98 today.   For questions or updates, please contact [facility] Please consult [email or web address] for contact info under        Signed, Corrin Parker, PA-C  08/04/2023, 8:03 AM     Personally seen and examined. Agree with APP above with the following comments:  Miss Kintz, recent NSTEMI who recently underwent a diagnostic heart catheterization for escalation of chest pain, developed a significant right arm hematoma post-procedure. Vascular surgery was consulted and recommended Doppler imaging, but did not advise surgical intervention.   The patient has remained free of chest pain and  reports no further discomfort. The right arm is currently well wrapped.  Regular rhyth.  Pain free.  LVEF reported as normal.  Tele: sinus rhythm  Would recommend  NSTEMI - likely related to high grade mRCA lesion also has high grade LAD lesion.  Neither are straight forward for intervention.  Given her hematoma issues with just a diagnostic LHC, will not pursue intervention on Monday. - We started Imdur, yesterday, will start BB today - increased statin dose With R arm Hematoma- appreciate vascular surgery recs - discussed at length with patients husband.  He is in agreement for more conservative management  [name], MD FASE [facility] Cardiologist [facility]  [address] [place], [address] [place], [place] [address] [phone]  10:42 AM

## 2023-08-04 NOTE — Plan of Care (Signed)

## 2023-08-04 NOTE — Plan of Care (Signed)
°  Problem: Clinical Measurements: °Goal: Ability to maintain clinical measurements within normal limits will improve °Outcome: Progressing °Goal: Will remain free from infection °Outcome: Progressing °Goal: Diagnostic test results will improve °Outcome: Progressing °Goal: Respiratory complications will improve °Outcome: Progressing °  °

## 2023-08-04 NOTE — Plan of Care (Signed)

## 2023-08-04 NOTE — Progress Notes (Signed)
PT Cancellation Note  Patient Details Name: Susan Davidson MRN: [id number] DOB: June 24, 1951   Cancelled Treatment:    Reason Eval/Treat Not Completed: Medical issues which prohibited therapy. Per MD, pt not appropriate for eval at this time due to elevated troponin levels and R UE hematoma. Will continue to follow as able.  Hilton Cork, PT, DPT Secure Chat Preferred  Rehab Office [phone]   Arturo Morton Brion Aliment 08/04/2023, 7:53 AM

## 2023-08-04 NOTE — Evaluation (Signed)
Clinical/Bedside Swallow Evaluation Patient Details  Name: Susan Davidson MRN: [id number] Date of Birth: October 23, 1950  Today's Date: 08/04/2023 Time: SLP Start Time (ACUTE ONLY): 0840 SLP Stop Time (ACUTE ONLY): 0900 SLP Time Calculation (min) (ACUTE ONLY): 20 min  Past Medical History:  Past Medical History:  Diagnosis Date   ANXIETY 02/12/2008   Qualifier: Diagnosis of  By: Jonny Ruiz MD, Len Blalock    CVA (cerebral vascular accident) [facility])    DEPRESSION 02/12/2008   Qualifier: Diagnosis of  By: Maris Berger    HYPERLIPIDEMIA 02/12/2008   Qualifier: Diagnosis of  By: Maris Berger    HYPERTENSION 02/12/2008   Qualifier: Diagnosis of  By: Maris Berger    HYPOTHYROIDISM 02/12/2008   Qualifier: Diagnosis of  By: Maris Berger    Impaired glucose tolerance 08/27/2011   Left hemiparesis (HCC)    OSTEOPENIA 02/12/2008   Qualifier: Diagnosis of  By: Jonny Ruiz MD, Len Blalock    VITAMIN D DEFICIENCY 04/14/2010   Qualifier: Diagnosis of  By: Jonny Ruiz MD, Len Blalock    Past Surgical History:  Past Surgical History:  Procedure Laterality Date   IR RADIOLOGIST EVAL & MGMT  08/18/2021   LEFT HEART CATH AND CORONARY ANGIOGRAPHY N/A 08/03/2023   Procedure: LEFT HEART CATH AND CORONARY ANGIOGRAPHY;  Surgeon: Orbie Pyo, MD;  Location: [facility] INVASIVE CV LAB;  Service: Cardiovascular;  Laterality: N/A;   HPI:  Susan Davidson is a 72 y.o. female with medical history significant of hypothyroidism, hyperlipidemia, GERD, depression, history of CVA and nonambulatory at baseline who presents to the emergency department from home via EMS due to chest pain, NSTEMI. CXR negative.    Assessment / Plan / Recommendation  Clinical Impression  Patient presents with a functional oropharyngeal swallow. No overt s/s of aspiration observed with liquids or solids. Patient independently consuming both via small controlled bite/sip sizes. Mastication mildly prolonged but deliberate and  efficient with regular texture solids. No modifications indicated to diet at this time however patient may change to mechanical soft in the future if preferred. SLP Visit Diagnosis: Dysphagia, unspecified (R13.10)    Aspiration Risk       Diet Recommendation Regular;Thin liquid    Liquid Administration via: Cup;Straw Medication Administration: Whole meds with liquid Supervision: Patient able to self feed Compensations: Slow rate;Small sips/bites Postural Changes: Seated upright at 90 degrees    Other  Recommendations Oral Care Recommendations: Oral care BID    Recommendations for follow up therapy are one component of a multi-disciplinary discharge planning process, led by the attending physician.  Recommendations may be updated based on patient status, additional functional criteria and insurance authorization.    Swallow Study   General HPI: Susan Davidson is a 72 y.o. female with medical history significant of hypothyroidism, hyperlipidemia, GERD, depression, history of CVA and nonambulatory at baseline who presents to the emergency department from home via EMS due to chest pain, NSTEMI. CXR negative. Type of Study: Bedside Swallow Evaluation Previous Swallow Assessment: previous bedside swallow study complete at [facility] recommended dysphagia 3, thin liquid. Diet Prior to this Study: Regular;Thin liquids (Level 0) Temperature Spikes Noted: No Respiratory Status: Nasal cannula History of Recent Intubation: No Behavior/Cognition: Alert;Pleasant mood;Cooperative Oral Cavity Assessment: Within Functional Limits Oral Care Completed by SLP: No Oral Cavity - Dentition: Adequate natural dentition Vision: Functional for self-feeding Self-Feeding Abilities: Able to feed self;Needs assist Patient Positioning: Upright in bed Baseline Vocal Quality: Low vocal intensity Volitional Cough: Strong Volitional Swallow: Able to  elicit    Oral/Motor/Sensory Function Overall Oral Motor/Sensory  Function: Mild impairment Facial ROM: Reduced left Facial Symmetry: Abnormal symmetry left Facial Strength: Within Functional Limits Facial Sensation: Within Functional Limits Lingual ROM: Within Functional Limits Lingual Symmetry: Within Functional Limits Lingual Strength: Within Functional Limits Lingual Sensation: Within Functional Limits Velum: Within Functional Limits Mandible: Within Functional Limits   Ice Chips Ice chips: Not tested   Thin Liquid Thin Liquid: Within functional limits Presentation: Straw    Nectar Thick Nectar Thick Liquid: Not tested   Honey Thick Honey Thick Liquid: Not tested   Puree Puree: Within functional limits Presentation: Spoon   Solid     Solid: Within functional limits Presentation: Self Fed     [employer] MA, CCC-SLP  Ezzard Ditmer Meryl 08/04/2023,9:20 AM

## 2023-08-04 NOTE — Progress Notes (Signed)
OT Cancellation Note  Patient Details Name: Susan Davidson MRN: [id number] DOB: Mar 04, 1951   Cancelled Treatment:    Reason Eval/Treat Not Completed: Medical issues which prohibited therapy.  Patient not appropriate for eval at this time due to elevated troponin levels and R UE hematoma. Will continue to follow as able.  OT to check on next week.  Tawney Vanorman D Aylin Rhoads 08/04/2023, 7:58 AM 08/04/2023  RP, OTR/L  Acute Rehabilitation Services  Office:  [phone]

## 2023-08-05 ENCOUNTER — Other Ambulatory Visit (HOSPITAL_COMMUNITY): Payer: Self-pay

## 2023-08-05 DIAGNOSIS — I214 Non-ST elevation (NSTEMI) myocardial infarction: Secondary | ICD-10-CM | POA: Diagnosis not present

## 2023-08-05 LAB — CBC
HCT: 35.5 % — ABNORMAL LOW (ref 36.0–46.0)
Hemoglobin: 11.6 g/dL — ABNORMAL LOW (ref 12.0–15.0)
MCH: 34 pg (ref 26.0–34.0)
MCHC: 32.7 g/dL (ref 30.0–36.0)
MCV: 104.1 fL — ABNORMAL HIGH (ref 80.0–100.0)
Platelets: 213 10*3/uL (ref 150–400)
RBC: 3.41 MIL/uL — ABNORMAL LOW (ref 3.87–5.11)
RDW: 15.2 % (ref 11.5–15.5)
WBC: 5.8 10*3/uL (ref 4.0–10.5)
nRBC: 0 % (ref 0.0–0.2)

## 2023-08-05 LAB — BASIC METABOLIC PANEL
Anion gap: 10 (ref 5–15)
BUN: 16 mg/dL (ref 8–23)
CO2: 24 mmol/L (ref 22–32)
Calcium: 8.4 mg/dL — ABNORMAL LOW (ref 8.9–10.3)
Chloride: 105 mmol/L (ref 98–111)
Creatinine, Ser: 1.18 mg/dL — ABNORMAL HIGH (ref 0.44–1.00)
GFR, Estimated: 48 mL/min — ABNORMAL LOW (ref >60)
Glucose, Bld: 111 mg/dL — ABNORMAL HIGH (ref 70–99)
Potassium: 4.4 mmol/L (ref 3.5–5.1)
Sodium: 139 mmol/L (ref 135–145)

## 2023-08-05 MED ORDER — METOPROLOL TARTRATE 25 MG PO TABS
12.5000 mg | ORAL_TABLET | Freq: Two times a day (BID) | ORAL | 2 refills | Status: DC
  Filled 2023-08-05: qty 30, 30d supply, fill #0

## 2023-08-05 MED ORDER — ISOSORBIDE MONONITRATE ER 30 MG PO TB24
90.0000 mg | ORAL_TABLET | Freq: Every day | ORAL | 2 refills | Status: DC
  Filled 2023-08-05: qty 90, 30d supply, fill #0

## 2023-08-05 MED ORDER — ROSUVASTATIN CALCIUM 20 MG PO TABS
20.0000 mg | ORAL_TABLET | Freq: Every day | ORAL | 2 refills | Status: DC
  Filled 2023-08-05: qty 30, 30d supply, fill #0

## 2023-08-05 MED ORDER — ASPIRIN 81 MG PO TBEC
81.0000 mg | DELAYED_RELEASE_TABLET | Freq: Every day | ORAL | 12 refills | Status: DC
  Filled 2023-08-05: qty 30, 30d supply, fill #0

## 2023-08-05 NOTE — Plan of Care (Signed)
Problem: Education: Goal: Knowledge of General Education information will improve Description: Including pain rating scale, medication(s)/side effects and non-pharmacologic comfort measures 08/05/2023 1419 by Herma Carson, RN Outcome: Adequate for Discharge 08/05/2023 3086 by Herma Carson, RN Outcome: Progressing   Problem: Health Behavior/Discharge Planning: Goal: Ability to manage health-related needs will improve 08/05/2023 1419 by Herma Carson, RN Outcome: Adequate for Discharge 08/05/2023 5784 by Herma Carson, RN Outcome: Progressing   Problem: Clinical Measurements: Goal: Ability to maintain clinical measurements within normal limits will improve 08/05/2023 1419 by Herma Carson, RN Outcome: Adequate for Discharge 08/05/2023 6962 by Herma Carson, RN Outcome: Progressing Goal: Will remain free from infection 08/05/2023 1419 by Herma Carson, RN Outcome: Adequate for Discharge 08/05/2023 9528 by Herma Carson, RN Outcome: Progressing Goal: Diagnostic test results will improve 08/05/2023 1419 by Herma Carson, RN Outcome: Adequate for Discharge 08/05/2023 4132 by Herma Carson, RN Outcome: Progressing Goal: Respiratory complications will improve 08/05/2023 1419 by Herma Carson, RN Outcome: Adequate for Discharge 08/05/2023 4401 by Herma Carson, RN Outcome: Progressing Goal: Cardiovascular complication will be avoided 08/05/2023 1419 by Herma Carson, RN Outcome: Adequate for Discharge 08/05/2023 0272 by Herma Carson, RN Outcome: Progressing   Problem: Activity: Goal: Risk for activity intolerance will decrease 08/05/2023 1419 by Herma Carson, RN Outcome: Adequate for Discharge 08/05/2023 5366 by Herma Carson, RN Outcome: Progressing   Problem: Nutrition: Goal: Adequate nutrition will be maintained 08/05/2023 1419 by Herma Carson, RN Outcome: Adequate for Discharge 08/05/2023 4403 by Herma Carson, RN Outcome: Progressing    Problem: Coping: Goal: Level of anxiety will decrease 08/05/2023 1419 by Herma Carson, RN Outcome: Adequate for Discharge 08/05/2023 4742 by Herma Carson, RN Outcome: Progressing   Problem: Elimination: Goal: Will not experience complications related to bowel motility 08/05/2023 1419 by Herma Carson, RN Outcome: Adequate for Discharge 08/05/2023 5956 by Herma Carson, RN Outcome: Progressing Goal: Will not experience complications related to urinary retention 08/05/2023 1419 by Herma Carson, RN Outcome: Adequate for Discharge 08/05/2023 3875 by Herma Carson, RN Outcome: Progressing   Problem: Pain Management: Goal: General experience of comfort will improve 08/05/2023 1419 by Herma Carson, RN Outcome: Adequate for Discharge 08/05/2023 6433 by Herma Carson, RN Outcome: Progressing   Problem: Safety: Goal: Ability to remain free from injury will improve 08/05/2023 1419 by Herma Carson, RN Outcome: Adequate for Discharge 08/05/2023 2951 by Herma Carson, RN Outcome: Progressing   Problem: Skin Integrity: Goal: Risk for impaired skin integrity will decrease 08/05/2023 1419 by Herma Carson, RN Outcome: Adequate for Discharge 08/05/2023 8841 by Herma Carson, RN Outcome: Progressing   Problem: Education: Goal: Understanding of CV disease, CV risk reduction, and recovery process will improve 08/05/2023 1419 by Herma Carson, RN Outcome: Adequate for Discharge 08/05/2023 6606 by Herma Carson, RN Outcome: Progressing Goal: Individualized Educational Video(s) 08/05/2023 1419 by Herma Carson, RN Outcome: Adequate for Discharge 08/05/2023 3016 by Herma Carson, RN Outcome: Progressing   Problem: Activity: Goal: Ability to return to baseline activity level will improve 08/05/2023 1419 by Herma Carson, RN Outcome: Adequate for Discharge 08/05/2023 0109 by Herma Carson, RN Outcome: Progressing   Problem: Cardiovascular: Goal: Ability to  achieve and maintain adequate cardiovascular perfusion will improve 08/05/2023 1419 by Herma Carson, RN Outcome: Adequate for Discharge 08/05/2023 3235 by Herma Carson, RN Outcome: Progressing Goal: Vascular access  site(s) Level 0-1 will be maintained 08/05/2023 1419 by Herma Carson, RN Outcome: Adequate for Discharge 08/05/2023 7829 by Herma Carson, RN Outcome: Progressing   Problem: Health Behavior/Discharge Planning: Goal: Ability to safely manage health-related needs after discharge will improve 08/05/2023 1419 by Herma Carson, RN Outcome: Adequate for Discharge 08/05/2023 5621 by Herma Carson, RN Outcome: Progressing

## 2023-08-05 NOTE — Discharge Summary (Signed)
Physician Discharge Summary  MERIAL MORITZ AVW:[id number] DOB: 11-10-1950 DOA: 08/02/2023  PCP: Corwin Levins, MD  Admit date: 08/02/2023 Discharge date: 08/05/2023  Time spent: 40 minutes  Recommendations for Outpatient Follow-up:  Follow outpatient CBC/CMP  Follow with cardiology outpatient  Discharge Diagnoses:  Principal Problem:   NSTEMI (non-ST elevated myocardial infarction) [facility]) Active Problems:   Acquired hypothyroidism   History of stroke   Dementia without behavioral disturbance (HCC)   History of dysphagia   Elevated MCV   Discharge Condition: stable  Diet recommendation: heart healthy  Filed Weights   08/02/23 1639 08/03/23 1158  Weight: 63 kg 62.8 kg    History of present illness:   [name] is Susan Davidson 72 y.o. female with medical history significant of hypothyroidism, hyperlipidemia, GERD, depression, history of CVA and nonambulatory at baseline who presents to the emergency department from home via EMS due to chest pain sustained at home.    She was admitted for NSTEMI.  Transferred to [facility] from [facility].    S/p LHC.  Cards planning medical therapy.  Stable for discharge 11/2.   Hospital Course:  Assessment and Plan:  NSTEMI Troponin has peaked at 2,262 Echo with EF 60-65%, no RWMA, small pericardial effusion Developed CP -> cath per cards -> Now s/p LHC, high grade mid RCA lesion -> planning medical therapy - given issues with hematoma, no plans for additional intervention Imdur, beta blocker, statin Plavix, aspirin, crestor    ACA aneurysm Noted Cards note from 10/31 notes "neurology has asked for minimal DAPT due to Susan Davidson 5 MM ACA aneurysm" - I don't see clearly documented in neuro note, discussed with cardiology today, recommended continued DAPT due to ACS above.     Elevated MCV Normal folate and b12   Right Forearm Hematoma No evidence of pseudoaneurysm or AVF on RUE [place]   Acquired hypothyroidism Continue levothyroxine    History of  strokes Continue Plavix and Crestor for secondary prevention   Dementia Continue supportive care and Namenda   Depression Continue Paxil, Depakote   History of dysphagia/FEN Patient has Nichele Slawson history of poor oral intake with concerns for aspiration Dysphagia 3 diet was recommended during last admission SLP -> recommending regular/thin liquid     Procedures: Echo IMPRESSIONS     1. Left ventricular ejection fraction, by estimation, is 60 to 65%. The  left ventricle has normal function. The left ventricle has no regional  wall motion abnormalities. Left ventricular diastolic parameters are  consistent with Grade I diastolic  dysfunction (impaired relaxation).   2. Right ventricular systolic function is normal. The right ventricular  size is normal. Tricuspid regurgitation signal is inadequate for assessing  PA pressure.   3. Shardea Cwynar small pericardial effusion is present.   4. The mitral valve is normal in structure. No evidence of mitral valve  regurgitation.   5. The aortic valve was not well visualized. Aortic valve regurgitation  is not visualized.   6. Aortic not well visualized.   7. The inferior vena cava is normal in size with greater than 50%  respiratory variability, suggesting right atrial pressure of 3 mmHg.   LHC     Mid RCA lesion is 95% stenosed.   1.  High-grade mid right coronary artery lesion.  After review with Dr. Izora Ribas and given its tortuosity and calcification in the context of the patient's comorbidities we have elected to pursue medical therapy.  If the patient is anginal symptoms cannot be controlled PCI can be considered. 2.  Mild disease of small caliber LAD and left circumflex. 3.  LVEDP of 10 mmHg:   Summary: Medical therapy for acute coronary syndrome.  [name] Plavix load was ordered.  If the patient's chest pain cannot be controlled with medical therapy PCI can be considered.  Summary:    Right: No evidence of pseudoaneurysm or AVF. Vein  patent.  *See table(s) above for measurements and observations.   Consultations: Cardiology vascular  Discharge Exam: Vitals:   08/05/23 0034 08/05/23 0331  BP: 112/60 111/67  Pulse: 65 72  Resp: 14 16  Temp:  98.1 F (36.7 C)  SpO2: 94% 97%   Having [name] hard time getting comfortable Asking me to sit bed up, then down, then up (etc) No complaints otherwise Discussed plan for discharge with husband  General: No acute distress. Cardiovascular: Heart sounds show [name] regular rate, and rhythm. Lungs: unlabored Abdomen: Soft, nontender, nondistended Neurological: Alert Moves all extremities 4 with equal strength. Cranial nerves II through XII grossly intact. Extremities: No clubbing or cyanosis. No edema.   Discharge Instructions   Discharge Instructions     Amb Referral to Cardiac Rehabilitation   Complete by: As directed    Diagnosis: NSTEMI   After initial evaluation and assessments completed: Virtual Based Care may be provided alone or in conjunction with Phase 2 Cardiac Rehab based on patient barriers.: Yes   Intensive Cardiac Rehabilitation (ICR) [facility] location only OR Traditional Cardiac Rehabilitation (TCR) *If criteria for ICR are not met will enroll in TCR [facility] only): Yes   Call MD for:  difficulty breathing, headache or visual disturbances   Complete by: As directed    Call MD for:  extreme fatigue   Complete by: As directed    Call MD for:  hives   Complete by: As directed    Call MD for:  persistant dizziness or light-headedness   Complete by: As directed    Call MD for:  persistant nausea and vomiting   Complete by: As directed    Call MD for:  redness, tenderness, or signs of infection (pain, swelling, redness, odor or green/yellow discharge around incision site)   Complete by: As directed    Call MD for:  severe uncontrolled pain   Complete by: As directed    Call MD for:  temperature >100.4   Complete by: As directed    Diet - low sodium heart healthy    Complete by: As directed    Discharge instructions   Complete by: As directed    You were seen for an NSTEMI (heart attack).  You had Luda Charbonneau catheterization and cardiology is planning to treat this medically.  We've started you on imdur, metoprolol.  We've increased your crestor.  We've added aspirin to your plavix.   Follow up with cardiology as an outpatient.  Return for new, recurrent, or worsening symptoms.  Please ask your PCP to request records from this hospitalization so they know what was done and what the next steps will be.   Increase activity slowly   Complete by: As directed       Allergies as of 08/05/2023       Reactions   Aleve [naproxen] Nausea Only   Fire Ant (solenopsis [place]) Anaphylaxis   Covid-19 Mrna Vaccine (pfizer) [covid-19 Mrna Vacc (moderna)]    Just the first round of moderna Covid vaccine, sluggish, uncoordinated, and weak, similar to flu.   Influenza Vac Split Quad    Fatigue, mimics Khale Nigh "super bad flu"  Medication List     STOP taking these medications    ibuprofen 200 MG tablet Commonly known as: ADVIL       TAKE these medications    acetaminophen 325 MG tablet Commonly known as: TYLENOL Take 2 tablets (650 mg total) by mouth every 6 (six) hours as needed for mild pain or headache (fever >/= 101).   albuterol 108 (90 Base) MCG/ACT inhaler Commonly known as: VENTOLIN HFA Inhale 2 puffs into the lungs every 6 (six) hours as needed for wheezing or shortness of breath.   alendronate 70 MG tablet Commonly known as: FOSAMAX Take 1 tablet (70 mg total) by mouth every 7 (seven) days. Take with Paulla Mcclaskey full glass of water on an empty stomach.   ALPRAZolam 0.5 MG tablet Commonly known as: XANAX TAKE 1 TABLET(0.5 MG) BY MOUTH TWICE DAILY AS NEEDED FOR ANXIETY What changed: See the new instructions.   ascorbic acid 500 MG tablet Commonly known as: VITAMIN C Take 1 tablet (500 mg total) by mouth daily. What changed: when to take this    aspirin EC 81 MG tablet Take 1 tablet (81 mg total) by mouth daily. Swallow whole. Start taking on: August 06, 2023   clopidogrel 75 MG tablet Commonly known as: PLAVIX Take 1 tablet (75 mg total) by mouth daily.   divalproex 250 MG DR tablet Commonly known as: DEPAKOTE Take 1 tablet by mouth See admin instructions. 1 tablet in the morning and 2 tablets every evening   folic acid 800 MCG tablet Commonly known as: FOLVITE Take 400 mcg by mouth at bedtime.   isosorbide mononitrate 30 MG 24 hr tablet Commonly known as: IMDUR Take 3 tablets (90 mg total) by mouth daily. Start taking on: August 06, 2023   lactose free nutrition Liqd Take 237 mLs by mouth 2 (two) times daily between meals. At least one [employer] day and sometimes more   levothyroxine 75 MCG tablet Commonly known as: Synthroid Take 1 tablet (75 mcg total) by mouth daily.   linaclotide 145 MCG Caps capsule Commonly known as: LINZESS Take 1 capsule (145 mcg total) by mouth daily.   melatonin 5 MG Tabs Take 5 mg by mouth at bedtime.   memantine 5 MG tablet Commonly known as: NAMENDA Take 1 tablet (5 mg total) by mouth 2 (two) times daily.   metoprolol tartrate 25 MG tablet Commonly known as: LOPRESSOR Take 0.5 tablets (12.5 mg total) by mouth 2 (two) times daily.   multivitamin tablet Take 1 tablet by mouth at bedtime.   OVER THE COUNTER MEDICATION Take 1 tablet by mouth at bedtime. magnesium   pantoprazole 40 MG tablet Commonly known as: PROTONIX Take 1 tablet (40 mg total) by mouth daily.   PARoxetine 40 MG tablet Commonly known as: PAXIL Take 40 mg by mouth every morning.   rosuvastatin 20 MG tablet Commonly known as: CRESTOR Take 1 tablet (20 mg total) by mouth at bedtime. What changed:  medication strength how much to take how to take this when to take this additional instructions   VITAMIN D-3 PO Take 1 capsule by mouth at bedtime.       Allergies  Allergen Reactions   Aleve  [Naproxen] Nausea Only   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   Influenza Vac Split Quad     Fatigue, mimics  Eriberto Felch "super bad flu"    Follow-up Information     Corrin Parker, PA-C Follow up.   Specialty: Cardiology Why: Hospital follow-up with Cardiology scheduled for 08/17/2023 at 2:20pm at our [facility] office. Please arrive 15 minutes early for check-in. If this date/ time does not work for you, please call our office to reschedule. Contact information: [address] [place] [address] [phone]                  The results of significant diagnostics from this hospitalization (including imaging, microbiology, ancillary and laboratory) are listed below for reference.    Significant Diagnostic Studies: VAS [place] UPPER EXTREMITY ARTERIAL DUPLEX  Result Date: 08/05/2023  UPPER EXTREMITY DUPLEX STUDY Patient Name:  Susan Davidson  Date of Exam:   08/04/2023 Medical Rec #: [id number]        Accession #:    [id number] Date of Birth: [birth year]         Patient Gender: F Patient Age:   33 years Exam Location:  [facility] Procedure:      VAS [place] UPPER EXTREMITY ARTERIAL DUPLEX Referring Phys: CALLIE GOODRICH --------------------------------------------------------------------------------  Indications: Bruising and Rule out pseudoaneurysm, rule hematoma. History:     Patient has Ely Spragg history of catheterization via right radial artery.  Performing Technologist: Fernande Bras  Examination Guidelines: Tadhg Eskew complete evaluation includes B-mode imaging, spectral Doppler, color Doppler, and power Doppler as needed of all accessible portions of each vessel. Bilateral testing is considered an integral part of Geisha Abernathy complete examination. Limited examinations for reoccurring indications may be performed as noted.   Right Doppler Findings: +-------------+----------+--------+--------+--------+ Site         PSV (cm/s)WaveformStenosisComments +-------------+----------+--------+--------+--------+ Brachial Dist81                                 +-------------+----------+--------+--------+--------+ Radial Mid   62                                 +-------------+----------+--------+--------+--------+ Radial Dist  55                                 +-------------+----------+--------+--------+--------+ Ulnar Dist   57                                 +-------------+----------+--------+--------+--------+    Summary:  Right: No evidence of pseudoaneurysm or AVF. Vein patent. *See table(s) above for measurements and observations. Electronically signed by Coral Else MD on 08/05/2023 at 10:50:57 AM.    Final    CARDIAC CATHETERIZATION  Result Date: 08/03/2023   Mid RCA lesion is 95% stenosed. 1.  High-grade mid right coronary artery lesion.  After review with Dr. Izora Ribas and given its tortuosity and calcification in the context of the patient's comorbidities we have elected to pursue medical therapy.  If the patient is anginal symptoms cannot be controlled PCI can be considered. 2.  Mild disease of small caliber LAD and left circumflex. 3.  LVEDP of 10 mmHg: Summary: Medical therapy for acute coronary syndrome.  Dolores Ewing Plavix load was ordered.  If the patient's chest pain cannot be controlled with medical therapy PCI can be considered.   ECHOCARDIOGRAM COMPLETE  Result Date: 08/03/2023  ECHOCARDIOGRAM REPORT   Patient Name:   KAILEIGH VISWANATHAN Date of Exam: 08/03/2023 Medical Rec #:  [id number]       Height:       62.0 in Accession #:    [id number]      Weight:       138.9 lb Date of Birth:  [birth year]        BSA:          1.637 m Patient Age:    72 years        BP:           133/90 mmHg Patient Gender: F               HR:           81 bpm. Exam Location:  Inpatient Procedure: 2D Echo, Cardiac Doppler  and Color Doppler Indications:    NSTEMI  History:        Patient has prior history of Echocardiogram examinations, most                 recent 07/04/2021. Stroke and CKD; Risk Factors:Hypertension,                 Dyslipidemia and Former Smoker.  Sonographer:    Dondra Prader RVT RCS Referring Phys: [id number] DAMARCUS Enora Trillo INGRAM  Sonographer Comments: Technically challenging study due to limited acoustic windows, Technically difficult study due to poor echo windows, suboptimal parasternal window, suboptimal apical window and suboptimal subcostal window. Image acquisition challenging due to uncooperative patient and Image acquisition challenging due to respiratory motion. IMPRESSIONS  1. Left ventricular ejection fraction, by estimation, is 60 to 65%. The left ventricle has normal function. The left ventricle has no regional wall motion abnormalities. Left ventricular diastolic parameters are consistent with Grade I diastolic dysfunction (impaired relaxation).  2. Right ventricular systolic function is normal. The right ventricular size is normal. Tricuspid regurgitation signal is inadequate for assessing PA pressure.  3. Wilbern Pennypacker small pericardial effusion is present.  4. The mitral valve is normal in structure. No evidence of mitral valve regurgitation.  5. The aortic valve was not well visualized. Aortic valve regurgitation is not visualized.  6. Aortic not well visualized.  7. The inferior vena cava is normal in size with greater than 50% respiratory variability, suggesting right atrial pressure of 3 mmHg. FINDINGS  Left Ventricle: Left ventricular ejection fraction, by estimation, is 60 to 65%. The left ventricle has normal function. The left ventricle has no regional wall motion abnormalities. The left ventricular internal cavity size was normal in size. There is  no left ventricular hypertrophy. Left ventricular diastolic parameters are consistent with Grade I diastolic dysfunction (impaired relaxation). Right Ventricle:  The right ventricular size is normal. Right ventricular systolic function is normal. Tricuspid regurgitation signal is inadequate for assessing PA pressure. Left Atrium: Left atrial size was normal in size. Right Atrium: Right atrial size was normal in size. Pericardium: Dex Blakely small pericardial effusion is present. Mitral Valve: The mitral valve is normal in structure. No evidence of mitral valve regurgitation. Tricuspid Valve: Tricuspid valve regurgitation is not demonstrated. Aortic Valve: The aortic valve was not well visualized. Aortic valve regurgitation is not visualized. Aortic valve mean gradient measures 1.0 mmHg. Aortic valve peak gradient measures 2.2 mmHg. Aortic valve area, by VTI measures 1.98 cm. Pulmonic Valve: The pulmonic valve was not well visualized. Pulmonic valve regurgitation is not visualized. Aorta: Not well visualized. Venous: The inferior vena cava is normal in size  with greater than 50% respiratory variability, suggesting right atrial pressure of 3 mmHg. IAS/Shunts: The interatrial septum was not well visualized.  LEFT VENTRICLE PLAX 2D LVIDd:         3.70 cm   Diastology LVIDs:         1.90 cm   LV e' medial:    5.26 cm/s LV PW:         0.90 cm   LV E/e' medial:  11.0 LV IVS:        0.90 cm   LV e' lateral:   6.57 cm/s LVOT diam:     1.60 cm   LV E/e' lateral: 8.8 LV SV:         27 LV SV Index:   17 LVOT Area:     2.01 cm  RIGHT VENTRICLE             IVC RV S prime:     10.30 cm/s  IVC diam: 1.30 cm LEFT ATRIUM           Index LA Vol (A4C): 14.0 ml 8.55 ml/m  AORTIC VALVE                    PULMONIC VALVE AV Area (Vmax):    2.06 cm     PV Vmax:       1.17 m/s AV Area (Vmean):   2.00 cm     PV Peak grad:  5.5 mmHg AV Area (VTI):     1.98 cm AV Vmax:           74.37 cm/s AV Vmean:          49.967 cm/s AV VTI:            0.138 m AV Peak Grad:      2.2 mmHg AV Mean Grad:      1.0 mmHg LVOT Vmax:         76.20 cm/s LVOT Vmean:        49.600 cm/s LVOT VTI:          0.136 m LVOT/AV VTI ratio:  0.99  AORTA Ao Root diam: 2.60 cm MITRAL VALVE MV Area (PHT): 3.85 cm    SHUNTS MV Decel Time: 197 msec    Systemic VTI:  0.14 m MV E velocity: 58.10 cm/s  Systemic Diam: 1.60 cm MV Shyna Duignan velocity: 96.70 cm/s MV E/Lillian Tigges ratio:  0.60 [place] signed by Carolan Clines Signature Date/Time: 08/03/2023/2:43:47 PM    Final    DG Chest Port 1 View  Result Date: 08/02/2023 CLINICAL DATA:  Former smoker.  Dull chest pain EXAM: PORTABLE CHEST 1 VIEW COMPARISON:  None Available. FINDINGS: Normal mediastinum and cardiac silhouette. Low lung volumes. Normal pulmonary vasculature. No evidence of effusion, infiltrate, or pneumothorax. No acute bony abnormality. IMPRESSION: No active disease. Electronically Signed   By: Genevive Bi M.D.   On: 08/02/2023 18:51    Microbiology: No results found for this or any previous visit (from the past 240 hour(s)).   Labs: Basic Metabolic Panel: Recent Labs  Lab 08/02/23 1805 08/03/23 0617 08/04/23 0635 08/05/23 0349  NA 144 138 141 139  K 4.0 4.7 4.5 4.4  CL 106 103 109 105  CO2 27 25 23 24   GLUCOSE 120* 101* 119* 111*  BUN 19 14 12 16   CREATININE 0.76 0.68 0.98 1.18*  CALCIUM 8.8* 8.5* 8.3* 8.4*  MG  --  2.3  --   --   PHOS  --  2.6  --   --    Liver Function Tests: Recent Labs  Lab 08/03/23 0617  AST 40  ALT 14  ALKPHOS 38  BILITOT 0.8  PROT 6.2*  ALBUMIN 3.1*   No results for input(s): "LIPASE", "AMYLASE" in the last 168 hours. No results for input(s): "AMMONIA" in the last 168 hours. CBC: Recent Labs  Lab 08/02/23 1805 08/03/23 0617 08/04/23 0635 08/05/23 0349  WBC 8.3 7.7 6.3 5.8  NEUTROABS 5.9  --   --   --   HGB 14.7 14.1 12.6 11.6*  HCT 46.3* 43.8 38.7 35.5*  MCV 107.4* 103.8* 103.5* 104.1*  PLT ACLMP 237 224 213   Cardiac Enzymes: No results for input(s): "CKTOTAL", "CKMB", "CKMBINDEX", "TROPONINI" in the last 168 hours. BNP: BNP (last 3 results) Recent Labs    08/03/23 0617  BNP 186.7*    ProBNP (last 3  results) No results for input(s): "PROBNP" in the last 8760 hours.  CBG: No results for input(s): "GLUCAP" in the last 168 hours.     Signed:  Lacretia Nicks MD.  [facility] 08/05/2023, 12:07 PM

## 2023-08-05 NOTE — Plan of Care (Signed)

## 2023-08-05 NOTE — Evaluation (Signed)
Physical Therapy Evaluation Patient Details Name: Susan Davidson MRN: [id number] DOB: 02/07/1951 Today's Date: 08/05/2023  History of Present Illness  72 y/o female presents to [facility] 08/02/23 d/t chest pain and admitted w/ NSTEMI. S/p LHC w/ high grade mid RCA lesion found 10/31. Pt developed large hematoma in R wrist after transradial band was released w/ no pseudoaneurysm. Prior admission 9/14 for acute respiratory failure w/ hypoxia 2/2 PNA. PMHx: hypothyroidism, GERD, depression, CVA, dementia, HTN.   Clinical Impression  Pt in bed upon arrival with husband present and agreeable to PT eval. Prior to admission, pt was able to stand with husband's assist and occasionally take steps. Husband reports helping with all aspects of mobility and ADLs at this point. Pt was MaxAx2-TotalAx2 for all mobility at this time. Pt was unable to support self sitting EOB or tolerate standing for more than ~15 seconds. Pt presents below functional mobility baseline with decreased strength, ROM, balance, activity tolerance and mobility. Pt has 24/7 physical support from husband and aides and would benefit from return home with HHPT.  Pt would benefit from acute skilled PT to address functional impairments. Acute PT to follow.          If plan is discharge home, recommend the following: A lot of help with walking and/or transfers;A lot of help with bathing/dressing/bathroom;Assistance with cooking/housework;Assistance with feeding;Direct supervision/assist for medications management;Direct supervision/assist for financial management;Assist for transportation;Help with stairs or ramp for entrance;Supervision due to cognitive status   Can travel by private vehicle    No    Equipment Recommendations Other (comment) (slide board)     Functional Status Assessment Patient has had a recent decline in their functional status and demonstrates the ability to make significant improvements in function in a reasonable and  predictable amount of time.     Precautions / Restrictions Precautions Precautions: Fall Restrictions Weight Bearing Restrictions: No      Mobility  Bed Mobility Overal bed mobility: Needs Assistance Bed Mobility: Supine to Sit, Sit to Supine     Supine to sit: +2 for physical assistance, HOB elevated, Total assist Sit to supine: Total assist, +2 for physical assistance, HOB elevated   General bed mobility comments: totalAx2 for all aspects of bed mobility    Transfers Overall transfer level: Needs assistance Equipment used: 2 person hand held assist Transfers: Sit to/from Stand Sit to Stand: Max assist, Total assist, +2 physical assistance    General transfer comment: MaxAx2 for boost up, initial rise and steadying. Pt unable to stand fully upright. TotalAx1 with anterior approach as pt was unable to maintain standing or assist with rising.    Ambulation/Gait  General Gait Details: pt unable to lift LE at this time to take steps        Balance Overall balance assessment: Mild deficits observed, not formally tested, Needs assistance Sitting-balance support: Feet supported, Single extremity supported Sitting balance-Leahy Scale: Zero Sitting balance - Comments: pt required MaxA-ModA posteriorly to maintain balance Postural control: Posterior lean Standing balance support: Bilateral upper extremity supported, During functional activity, Reliant on assistive device for balance Standing balance-Leahy Scale: Zero Standing balance comment: pt unable to stand w/o totalA       Pertinent Vitals/Pain Pain Assessment Pain Assessment: No/denies pain    Home Living Family/patient expects to be discharged to:: Private residence Living Arrangements: Spouse/significant other Available Help at Discharge: Family;Available 24 hours/day;Personal care attendant Type of Home: House Home Access: Stairs to enter   [employer] of Steps: 5  Home Layout: One level Home  Equipment: Wheelchair - manual;Lift chair;BSC/3in1;Hospital bed;Rolling Walker (2 wheels)      Prior Function Prior Level of Function : Needs assist       Physical Assist : Mobility (physical);ADLs (physical) Mobility (physical): Bed mobility;Transfers;Gait;Stairs ADLs (physical): Grooming;Bathing;Dressing;Toileting;IADLs Mobility Comments: stands w/ RW and physical assist, uses WC primarily. Husband bumps pt up/down the steps. Working on getting a ramp ADLs Comments: requires assist for all ADLs     Extremity/Trunk Assessment   Upper Extremity Assessment Upper Extremity Assessment: Defer to OT evaluation    Lower Extremity Assessment Lower Extremity Assessment: RLE deficits/detail;LLE deficits/detail (alert to light touch) RLE Deficits / Details: R hip flexion 2+/5, knee ext 3/5, ankle DF 3/5 LLE Deficits / Details: L hip flexion 1/5, knee ext. 2+5, ankle DF- 2+/5    Cervical / Trunk Assessment Cervical / Trunk Assessment: Kyphotic  Communication   Communication Communication: No apparent difficulties Cueing Techniques: Verbal cues;Tactile cues  Cognition Arousal: Lethargic Behavior During Therapy: Flat affect Overall Cognitive Status: Impaired/Different from baseline Area of Impairment: Following commands, Memory, Problem solving    Memory: Decreased short-term memory Following Commands: Follows one step commands with increased time, Follows one step commands inconsistently     Problem Solving: Slow processing, Decreased initiation, Difficulty sequencing, Requires verbal cues, Requires tactile cues General Comments: unclear pt's cognitive baseline, decreased alertness in today's session. Pt would respond to questions with one word answers. Increased time for processing and verbal/tactile cues for sequencing        General Comments General comments (skin integrity, edema, etc.): VSS on RA     PT Assessment Patient needs continued PT services  PT Problem List  Decreased strength;Decreased range of motion;Decreased activity tolerance;Decreased balance;Decreased mobility;Decreased coordination;Decreased knowledge of use of DME;Decreased safety awareness       PT Treatment Interventions DME instruction;Gait training;Functional mobility training;Therapeutic activities;Therapeutic exercise;Balance training;Neuromuscular re-education;Patient/family education;Wheelchair mobility training    PT Goals (Current goals can be found in the Care Plan section)  Acute Rehab PT Goals Patient Stated Goal: to go home PT Goal Formulation: With patient/family Time For Goal Achievement: 08/19/23 Potential to Achieve Goals: Fair Additional Goals Additional Goal #1: Pt will transfer from bed/chair using slide board and MaxA    Frequency Min 1X/week        AM-PAC PT "6 Clicks" Mobility  Outcome Measure Help needed turning from your back to your side while in a flat bed without using bedrails?: Total Help needed moving from lying on your back to sitting on the side of a flat bed without using bedrails?: Total Help needed moving to and from a bed to a chair (including a wheelchair)?: Total Help needed standing up from a chair using your arms (e.g., wheelchair or bedside chair)?: Total Help needed to walk in hospital room?: Total Help needed climbing 3-5 steps with a railing? : Total 6 Click Score: 6    End of Session Equipment Utilized During Treatment: Gait belt Activity Tolerance: Patient limited by fatigue Patient left: in bed;with call bell/phone within reach;with bed alarm set;with family/visitor present Nurse Communication: Mobility status;Other (comment) (need for PTAR and slide board) PT Visit Diagnosis: Unsteadiness on feet (R26.81);Muscle weakness (generalized) (M62.81);Hemiplegia and hemiparesis Hemiplegia - Right/Left: Left Hemiplegia - caused by: Cerebral infarction    Time: 1610-9604 PT Time Calculation (min) (ACUTE ONLY): 41 min   Charges:    PT Evaluation $PT Eval Moderate Complexity: 1 Mod   PT General Charges $$ ACUTE PT VISIT: 1 Visit  Hilton Cork, PT, DPT Secure Chat Preferred  Rehab Office [phone]   Arturo Morton Brion Aliment 08/05/2023, 3:31 PM

## 2023-08-05 NOTE — Progress Notes (Signed)
CARDIAC REHAB PHASE I     Post MI/stent education including restrictions, risk factors, exercise guidelines, antiplatelet therapy importance, MI booklet, NTG use, heart healthy diet and CRP2 reviewed. All questions and concerns addressed. Will refer to AP for CRP2, per protocol, pt is mostly bed bound. Written educational materials provided for family to review. CRP1 will sign off.   Woodroe Chen, RN BSN 08/05/2023 8:22 AM

## 2023-08-05 NOTE — Progress Notes (Signed)
Rounding Note    Patient Name: Susan Davidson Date of Encounter: 08/05/2023  [facility] Cardiologist: Christell Constant, MD   Subjective   No CP or dyspnea  Inpatient Medications    Scheduled Meds:  aspirin EC  81 mg Oral Daily   clopidogrel  75 mg Oral Daily   divalproex  250 mg Oral Daily   And   divalproex  500 mg Oral QHS   isosorbide mononitrate  90 mg Oral Daily   levothyroxine  75 mcg Oral Daily   memantine  5 mg Oral BID   metoprolol tartrate  12.5 mg Oral BID   pantoprazole  40 mg Oral Daily   PARoxetine  40 mg Oral q morning   rosuvastatin  20 mg Oral QHS   sodium chloride flush  3 mL Intravenous Q12H   Continuous Infusions:  PRN Meds: acetaminophen, alum & mag hydroxide-simeth, HYDROmorphone (DILAUDID) injection, morphine injection, nitroGLYCERIN, ondansetron **OR** ondansetron (ZOFRAN) IV, sodium chloride flush   Vital Signs    Vitals:   08/04/23 1929 08/04/23 2157 08/05/23 0034 08/05/23 0331  BP: (!) 104/53 (!) 110/57 112/60 111/67  Pulse: 71 68 65 72  Resp: 16  14 16   Temp: 98.5 F (36.9 C)   98.1 F (36.7 C)  TempSrc: Oral   Oral  SpO2: 94%  94% 97%  Weight:      Height:        Intake/Output Summary (Last 24 hours) at 08/05/2023 4098 Last data filed at 08/05/2023 0331 Gross per 24 hour  Intake --  Output 200 ml  Net -200 ml      08/03/2023   11:58 AM 08/02/2023    4:39 PM 07/31/2023    9:15 AM  Last 3 Weights  Weight (lbs) 138 lb 7.2 oz 138 lb 14.2 oz 140 lb  Weight (kg) 62.8 kg 63 kg 63.504 kg      Telemetry    Sinus - Personally Reviewed  Physical Exam   GEN: No acute distress.   Neck: No JVD Cardiac: RRR, no murmurs, rubs, or gallops.  Respiratory: Clear to auscultation bilaterally. GI: Soft, nontender, non-distended  MS: No edema; right upper extremity with 2+ radial pulse, ecchymosis noted. Neuro:  Nonfocal  Psych: Normal affect   Labs    High Sensitivity Troponin:   Recent Labs  Lab  08/02/23 1805 08/02/23 1949 08/03/23 1515 08/03/23 1813  TROPONINIHS 2,035* 2,056* 2,262* 2,903*     Chemistry Recent Labs  Lab 08/03/23 0617 08/04/23 0635 08/05/23 0349  NA 138 141 139  K 4.7 4.5 4.4  CL 103 109 105  CO2 25 23 24   GLUCOSE 101* 119* 111*  BUN 14 12 16   CREATININE 0.68 0.98 1.18*  CALCIUM 8.5* 8.3* 8.4*  MG 2.3  --   --   PROT 6.2*  --   --   ALBUMIN 3.1*  --   --   AST 40  --   --   ALT 14  --   --   ALKPHOS 38  --   --   BILITOT 0.8  --   --   GFRNONAA >60 >60 48*  ANIONGAP 10 9 10     Lipids  Recent Labs  Lab 08/03/23 1813  CHOL 146  TRIG 92  HDL 39*  LDLCALC 89  CHOLHDL 3.7    Hematology Recent Labs  Lab 08/03/23 0617 08/04/23 0635 08/05/23 0349  WBC 7.7 6.3 5.8  RBC 4.22 3.74* 3.41*  HGB 14.1  12.6 11.6*  HCT 43.8 38.7 35.5*  MCV 103.8* 103.5* 104.1*  MCH 33.4 33.7 34.0  MCHC 32.2 32.6 32.7  RDW 15.6* 15.5 15.2  PLT 237 224 213    BNP Recent Labs  Lab 08/03/23 0617  BNP 186.7*      Radiology    VAS [place] UPPER EXTREMITY ARTERIAL DUPLEX  Result Date: 08/04/2023  UPPER EXTREMITY DUPLEX STUDY Patient Name:  Susan Davidson  Date of Exam:   08/04/2023 Medical Rec #: [id number]        Accession #:    [id number] Date of Birth: 22-Feb-1951         Patient Gender: F Patient Age:   2 years Exam Location:  [facility] Procedure:      VAS [place] UPPER EXTREMITY ARTERIAL DUPLEX Referring Phys: CALLIE GOODRICH --------------------------------------------------------------------------------  Indications: Bruising and Rule out pseudoaneurysm, rule hematoma. History:     Patient has a history of catheterization via right radial artery.  Performing Technologist: Fernande Bras  Examination Guidelines: A complete evaluation includes B-mode imaging, spectral Doppler, color Doppler, and power Doppler as needed of all accessible portions of each vessel. Bilateral testing is considered an integral part of a complete examination. Limited  examinations for reoccurring indications may be performed as noted.  Right Doppler Findings: +-------------+----------+--------+--------+--------+ Site         PSV (cm/s)WaveformStenosisComments +-------------+----------+--------+--------+--------+ Brachial Dist81                                 +-------------+----------+--------+--------+--------+ Radial Mid   62                                 +-------------+----------+--------+--------+--------+ Radial Dist  55                                 +-------------+----------+--------+--------+--------+ Ulnar Dist   57                                 +-------------+----------+--------+--------+--------+    Summary:  Right: No evidence of pseudoaneurysm or AVF. Vein patent. *See table(s) above for measurements and observations.    Preliminary    CARDIAC CATHETERIZATION  Result Date: 08/03/2023   Mid RCA lesion is 95% stenosed. 1.  High-grade mid right coronary artery lesion.  After review with Dr. Izora Ribas and given its tortuosity and calcification in the context of the patient's comorbidities we have elected to pursue medical therapy.  If the patient is anginal symptoms cannot be controlled PCI can be considered. 2.  Mild disease of small caliber LAD and left circumflex. 3.  LVEDP of 10 mmHg: Summary: Medical therapy for acute coronary syndrome.  A Plavix load was ordered.  If the patient's chest pain cannot be controlled with medical therapy PCI can be considered.   ECHOCARDIOGRAM COMPLETE  Result Date: 08/03/2023    ECHOCARDIOGRAM REPORT   Patient Name:   Susan Davidson Date of Exam: 08/03/2023 Medical Rec #:  [id number]       Height:       62.0 in Accession #:    [id number]      Weight:       138.9 lb Date of Birth:  Mar 21, 1951        BSA:  1.637 m Patient Age:    72 years        BP:           133/90 mmHg Patient Gender: F               HR:           81 bpm. Exam Location:  Inpatient Procedure: 2D Echo, Cardiac  Doppler and Color Doppler Indications:    NSTEMI  History:        Patient has prior history of Echocardiogram examinations, most                 recent 07/04/2021. Stroke and CKD; Risk Factors:Hypertension,                 Dyslipidemia and Former Smoker.  Sonographer:    Dondra Prader RVT RCS Referring Phys: [id number] DAMARCUS A INGRAM  Sonographer Comments: Technically challenging study due to limited acoustic windows, Technically difficult study due to poor echo windows, suboptimal parasternal window, suboptimal apical window and suboptimal subcostal window. Image acquisition challenging due to uncooperative patient and Image acquisition challenging due to respiratory motion. IMPRESSIONS  1. Left ventricular ejection fraction, by estimation, is 60 to 65%. The left ventricle has normal function. The left ventricle has no regional wall motion abnormalities. Left ventricular diastolic parameters are consistent with Grade I diastolic dysfunction (impaired relaxation).  2. Right ventricular systolic function is normal. The right ventricular size is normal. Tricuspid regurgitation signal is inadequate for assessing PA pressure.  3. A small pericardial effusion is present.  4. The mitral valve is normal in structure. No evidence of mitral valve regurgitation.  5. The aortic valve was not well visualized. Aortic valve regurgitation is not visualized.  6. Aortic not well visualized.  7. The inferior vena cava is normal in size with greater than 50% respiratory variability, suggesting right atrial pressure of 3 mmHg. FINDINGS  Left Ventricle: Left ventricular ejection fraction, by estimation, is 60 to 65%. The left ventricle has normal function. The left ventricle has no regional wall motion abnormalities. The left ventricular internal cavity size was normal in size. There is  no left ventricular hypertrophy. Left ventricular diastolic parameters are consistent with Grade I diastolic dysfunction (impaired relaxation). Right  Ventricle: The right ventricular size is normal. Right ventricular systolic function is normal. Tricuspid regurgitation signal is inadequate for assessing PA pressure. Left Atrium: Left atrial size was normal in size. Right Atrium: Right atrial size was normal in size. Pericardium: A small pericardial effusion is present. Mitral Valve: The mitral valve is normal in structure. No evidence of mitral valve regurgitation. Tricuspid Valve: Tricuspid valve regurgitation is not demonstrated. Aortic Valve: The aortic valve was not well visualized. Aortic valve regurgitation is not visualized. Aortic valve mean gradient measures 1.0 mmHg. Aortic valve peak gradient measures 2.2 mmHg. Aortic valve area, by VTI measures 1.98 cm. Pulmonic Valve: The pulmonic valve was not well visualized. Pulmonic valve regurgitation is not visualized. Aorta: Not well visualized. Venous: The inferior vena cava is normal in size with greater than 50% respiratory variability, suggesting right atrial pressure of 3 mmHg. IAS/Shunts: The interatrial septum was not well visualized.  LEFT VENTRICLE PLAX 2D LVIDd:         3.70 cm   Diastology LVIDs:         1.90 cm   LV e' medial:    5.26 cm/s LV PW:         0.90 cm   LV E/e'  medial:  11.0 LV IVS:        0.90 cm   LV e' lateral:   6.57 cm/s LVOT diam:     1.60 cm   LV E/e' lateral: 8.8 LV SV:         27 LV SV Index:   17 LVOT Area:     2.01 cm  RIGHT VENTRICLE             IVC RV S prime:     10.30 cm/s  IVC diam: 1.30 cm LEFT ATRIUM           Index LA Vol (A4C): 14.0 ml 8.55 ml/m  AORTIC VALVE                    PULMONIC VALVE AV Area (Vmax):    2.06 cm     PV Vmax:       1.17 m/s AV Area (Vmean):   2.00 cm     PV Peak grad:  5.5 mmHg AV Area (VTI):     1.98 cm AV Vmax:           74.37 cm/s AV Vmean:          49.967 cm/s AV VTI:            0.138 m AV Peak Grad:      2.2 mmHg AV Mean Grad:      1.0 mmHg LVOT Vmax:         76.20 cm/s LVOT Vmean:        49.600 cm/s LVOT VTI:          0.136 m LVOT/AV  VTI ratio: 0.99  AORTA Ao Root diam: 2.60 cm MITRAL VALVE MV Area (PHT): 3.85 cm    SHUNTS MV Decel Time: 197 msec    Systemic VTI:  0.14 m MV E velocity: 58.10 cm/s  Systemic Diam: 1.60 cm MV A velocity: 96.70 cm/s MV E/A ratio:  0.60 Photographer signed by Carolan Clines Signature Date/Time: 08/03/2023/2:43:47 PM    Final      Patient Profile     72 y.o. female with a history of hypertension, hyperlipidemia, CKD stage IIIa, prior CVA, and dementia who was admitted on 08/02/2023 with NSTEMI after presenting with acute onset of chest pain.  Echocardiogram this admission shows normal LV function, grade 1 diastolic dysfunction, small pericardial effusion.  Cardiac catheterization October 31 showed 95% mid RCA lesion; given significant tortuosity of the vessel and calcification as well as patient comorbidities medical therapy has been recommended.  Could consider PCI if symptoms not controlled with medical therapy.  Procedure was complicated by large hematoma of right upper extremity.  Follow-up ultrasound showed no pseudoaneurysm.  Assessment & Plan    1 non-ST elevation myocardial infarction-cardiac catheterization results noted.  Significant RCA lesion but difficult to approach percutaneously.  Plan will be medical therapy unless symptoms cannot be controlled otherwise.  Continue aspirin, Plavix, statin, metoprolol and isosorbide.  2 right arm hematoma-no pseudoaneurysm noted on ultrasound.  Neurovascularly intact.  3 hypertension-patient's blood pressure is controlled.  Continue present medications.  4 hyperlipidemia-continue statin.  Patient can be discharged from a cardiac standpoint.  Will arrange follow-up with APP 2 weeks after discharge.  Would continue present medications at discharge.  For questions or updates, please contact [facility] Please consult [email or web address] for contact info under        Signed, Olga Millers, MD  08/05/2023, 6:38 AM

## 2023-08-05 NOTE — Evaluation (Signed)
Occupational Therapy Evaluation Patient Details Name: Susan Davidson MRN: [id number] DOB: 05-Sep-1951 Today's Date: 08/05/2023   History of Present Illness 72 y/o female presents to [facility] 08/02/23 d/t chest pain and admitted w/ NSTEMI. S/p LHC w/ high grade mid RCA lesion found 10/31. Pt developed large hematoma in R wrist after transradial band was released w/ no pseudoaneurysm. Prior admission 9/14 for acute respiratory failure w/ hypoxia 2/2 PNA. PMHx: hypothyroidism, GERD, depression, CVA, dementia, HTN.   Clinical Impression   PTA, pt was living at home with her husband. She required assistance with all ADL and was primarily bed bound unless needing to go to appointments. Pt reports he would assist with face-to-face stand pivot transfers to/from w/c and pt would assist with steps at times. He reports they are working on getting a ramp installed. Pt currently requires maxA+2 for initial 2x sit<>stand and totalA 1 person assist for face-to-face sit<>stand from EOB. She presents with decreased activity tolerance, poor endurance, residual L hemiplegia, instability and cognitive limitations impacting her independence with ADL/IADL and functional mobility. Pt appears to be grossly deconditioned. She would benefit from continued skilled OT services address limitations. Pt and husband requesting to return home. Recommend HHOT follow-up. Will continue to follow acutely and progress appropriately.       If plan is discharge home, recommend the following: A lot of help with walking and/or transfers;A lot of help with bathing/dressing/bathroom;Assistance with cooking/housework;Assistance with feeding;Assist for transportation;Help with stairs or ramp for entrance    Functional Status Assessment  Patient has had a recent decline in their functional status and demonstrates the ability to make significant improvements in function in a reasonable and predictable amount of time.  Equipment Recommendations  Other  (comment) (slide board)    Recommendations for Other Services       Precautions / Restrictions Precautions Precautions: Fall Restrictions Weight Bearing Restrictions: No      Mobility Bed Mobility Overal bed mobility: Needs Assistance Bed Mobility: Supine to Sit, Sit to Supine     Supine to sit: +2 for physical assistance, HOB elevated, Total assist Sit to supine: Total assist, +2 for physical assistance, HOB elevated   General bed mobility comments: totalAx2 for all aspects of bed mobility    Transfers Overall transfer level: Needs assistance Equipment used: 2 person hand held assist Transfers: Sit to/from Stand Sit to Stand: Max assist, Total assist, +2 physical assistance           General transfer comment: MaxAx2 for boost up, initial rise and steadying. Pt unable to stand fully upright. TotalAx1 with anterior approach as pt was unable to maintain standing or assist with rising.Pt fatigued after x3 attempts at standing and requested to lay back down. educated pt's husband on use of slide board to assist with transfers, provided visual demonstration.      Balance Overall balance assessment: Mild deficits observed, not formally tested, Needs assistance Sitting-balance support: Feet supported, Single extremity supported Sitting balance-Leahy Scale: Zero Sitting balance - Comments: pt required MaxA-ModA posteriorly to maintain balance Postural control: Posterior lean Standing balance support: Bilateral upper extremity supported, During functional activity, Reliant on assistive device for balance Standing balance-Leahy Scale: Zero Standing balance comment: pt unable to stand w/o totalA                           ADL either performed or assessed with clinical judgement   ADL Overall ADL's : Needs assistance/impaired Eating/Feeding: Set up;Sitting;Bed level  Grooming: Set up;Sitting;Bed level   Upper Body Bathing: Moderate assistance;Bed level   Lower  Body Bathing: Maximal assistance   Upper Body Dressing : Moderate assistance   Lower Body Dressing: Maximal assistance   Toilet Transfer: Maximal assistance;+2 for safety/equipment;Stand-pivot;BSC/3in1   Toileting- Clothing Manipulation and Hygiene: Maximal assistance;Sit to/from stand;+2 for safety/equipment       Functional mobility during ADLs: Maximal assistance;+2 for physical assistance;+2 for safety/equipment General ADL Comments: pt with poor activity tolerance, genealized weakness, residual L hemiplegia;fatigues quickly, initially requiring maxA+2 but then needing more of totalA to powerup into standing     Vision         Perception         Praxis         Pertinent Vitals/Pain Pain Assessment Pain Assessment: No/denies pain     Extremity/Trunk Assessment Upper Extremity Assessment Upper Extremity Assessment: Generalized weakness;LUE deficits/detail;RUE deficits/detail RUE Deficits / Details: grossly 3/5 LUE Deficits / Details: hx of cva with residual left sided weakness. maintains in elbow flexion, PROM about 110* extension. 4th and 5th digit flexion contracture. poor skin integrity in palm, educated importance of maintaining good skin integrity and frequent skin checks, provided pt with palm guard and educated pt and spouse on wear schedule and cleaning palm guard   Lower Extremity Assessment Lower Extremity Assessment: Defer to PT evaluation RLE Deficits / Details: R hip flexion 2+/5, knee ext 3/5, ankle DF 3/5 LLE Deficits / Details: L hip flexion 1/5, knee ext. 2+5, ankle DF- 2+/5   Cervical / Trunk Assessment Cervical / Trunk Assessment: Kyphotic   Communication Communication Communication: No apparent difficulties Cueing Techniques: Verbal cues;Tactile cues   Cognition Arousal: Lethargic Behavior During Therapy: Flat affect Overall Cognitive Status: Impaired/Different from baseline Area of Impairment: Following commands, Memory, Problem solving,  Attention                   Current Attention Level: Focused Memory: Decreased short-term memory Following Commands: Follows one step commands with increased time, Follows one step commands inconsistently     Problem Solving: Slow processing, Decreased initiation, Difficulty sequencing, Requires verbal cues, Requires tactile cues General Comments: unclear pt's cognitive baseline, pt appeared more lethargic during this session. Pt would respond to questions with one word answers. Increased time for processing and verbal/tactile cues for sequencing. frequent verbal encouragement for postural corrections while sitting and with standing attempts.     General Comments  vss on RA    Exercises     Shoulder Instructions      Home Living Family/patient expects to be discharged to:: Private residence Living Arrangements: Spouse/significant other Available Help at Discharge: Family;Available 24 hours/day;Personal care attendant Type of Home: House Home Access: Stairs to enter [employer] of Steps: 5   Home Layout: One level         Firefighter: Standard Bathroom Accessibility: Yes   Home Equipment: Wheelchair - manual;Lift chair;BSC/3in1;Hospital bed;Rolling Walker (2 wheels)          Prior Functioning/Environment Prior Level of Function : Needs assist       Physical Assist : Mobility (physical);ADLs (physical) Mobility (physical): Bed mobility;Transfers;Gait;Stairs ADLs (physical): Grooming;Bathing;Dressing;Toileting;IADLs Mobility Comments: stands w/ RW and physical assist, uses WC primarily. Husband bumps pt up/down the steps. Working on getting a ramp ADLs Comments: requires assist for all ADLs;husband reports pca did not specify frequency or duration. Pt's husband works during the day.        OT Problem List: Decreased activity tolerance;Impaired balance (sitting  and/or standing);Decreased safety awareness;Decreased cognition;Impaired UE  functional use      OT Treatment/Interventions: Self-care/ADL training;Energy conservation;DME and/or AE instruction;Therapeutic activities;Patient/family education;Balance training    OT Goals(Current goals can be found in the care plan section) Acute Rehab OT Goals Patient Stated Goal: to go home today OT Goal Formulation: With patient/family Time For Goal Achievement: 08/19/23 Potential to Achieve Goals: Fair ADL Goals Additional ADL Goal #1: Pt will complete toiltet transfer to drop arm BSC using slide board with moderate assistance. Additional ADL Goal #2: Pt's caregiver/spouse will demonstrate independence with safe and appropriate assistance of pt during transfers.  OT Frequency: Min 1X/week    Co-evaluation              AM-PAC OT "6 Clicks" Daily Activity     Outcome Measure Help from another person eating meals?: A Little Help from another person taking care of personal grooming?: A Little Help from another person toileting, which includes using toliet, bedpan, or urinal?: A Lot Help from another person bathing (including washing, rinsing, drying)?: A Lot Help from another person to put on and taking off regular upper body clothing?: A Lot Help from another person to put on and taking off regular lower body clothing?: A Lot 6 Click Score: 14   End of Session Nurse Communication: Mobility status  Activity Tolerance: Patient limited by fatigue Patient left: in bed;with call bell/phone within reach;with bed alarm set  OT Visit Diagnosis: Other abnormalities of gait and mobility (R26.89);Muscle weakness (generalized) (M62.81);Other symptoms and signs involving cognitive function                Time: 1610-9604 OT Time Calculation (min): 36 min Charges:  OT General Charges $OT Visit: 1 Visit OT Evaluation $OT Eval Moderate Complexity: 1 Mod  Dolores Mcgovern OTR/L Acute Rehabilitation Services Office: [phone]   [place]idence Crosby 08/05/2023, 3:58 PM

## 2023-08-05 NOTE — TOC Transition Note (Addendum)
Transition of Care [facility]) - CM/SW Discharge Note   Patient Details  Name: Susan Davidson MRN: [id number] Date of Birth: May 08, 1951  [facility] CM/SW Contact:  Ronny Bacon, RN Phone Number: 08/05/2023, 2:28 PM   Clinical Narrative: Patient is being discharged. Secure message from floor nurse regarding patient needing PTAR  transport home. Order for [facility] PT/OT/RN/Aide noted and arranged through [facility] with [employer] [facility]. Sliding board order noted and arranged through [facility] with [employer].    1542: Notified by floor nurse that slide board has arrived and patient is not ready for PTAR. Call to PTAR to arrange transportation home.    Final next level of care: Home w Home Health Services Barriers to Discharge: No Barriers Identified   Patient Goals and CMS Choice      Discharge Placement                         Discharge Plan and Services Additional resources added to the After Visit Summary for                  DME Arranged: Other see comment (Slide board) DME Agency: [employer] Date DME Agency Contacted: 08/05/23 Time DME Agency Contacted: [phone] Representative spoke with at DME Agency: [facility] HH Arranged: PT, OT, RN, Nurse's Aide HH Agency: [facility] Date [facility] Agency Contacted: 08/05/23 Time HH Agency Contacted: 1420 (responded 1424) Representative spoke with at [facility] Agency: Kandee Keen  Social Determinants of Health (SDOH) Interventions SDOH Screenings   Food Insecurity: No Food Insecurity (08/03/2023)  Housing: Patient Unable To Answer (08/03/2023)  Transportation Needs: No Transportation Needs (08/03/2023)  Utilities: Not At Risk (08/03/2023)  Alcohol Screen: Low Risk  (04/27/2023)  Depression (PHQ2-9): Low Risk  (05/29/2023)  Recent Concern: Depression (PHQ2-9) - High Risk (04/27/2023)  Financial Resource Strain: Low Risk  (04/27/2023)  Physical Activity: Inactive (04/27/2023)  Social Connections: Socially Isolated (04/27/2023)  Stress: Stress Concern  Present (04/27/2023)  Tobacco Use: Medium Risk (08/02/2023)  Health Literacy: Adequate Health Literacy (04/27/2023)     Readmission Risk Interventions    01/29/2021   12:39 PM  Readmission Risk Prevention Plan  Medication Screening Complete  Transportation Screening Complete

## 2023-08-05 NOTE — Discharge Instructions (Signed)
Post NSTEMI: NO HEAVY LIFTING X 2 WEEKS. NO SEXUAL ACTIVITY X 2 WEEKS. NO DRIVING X 1 WEEK. NO SOAKING BATHS, HOT TUBS, POOLS, ETC., X 7 DAYS.  Radial Site Care: Refer to this sheet in the next few weeks. These instructions provide you with information on caring for yourself after your procedure. Your caregiver may also give you more specific instructions. Your treatment has been planned according to current medical practices, but problems sometimes occur. Call your caregiver if you have any problems or questions after your procedure. HOME CARE INSTRUCTIONS  You may shower the day after the procedure.Remove the bandage (dressing) and gently wash the site with plain soap and water.Gently pat the site dry.   Do not apply powder or lotion to the site.   Do not submerge the affected site in water for 3 to 5 days.   Inspect the site at least twice daily.   Do not flex or bend the affected arm for 24 hours.   No lifting over 5 pounds (2.3 kg) for 5 days after your procedure.   Do not drive home if you are discharged the same day of the procedure. Have someone else drive you.  What to expect:  Any bruising will usually fade within 1 to 2 weeks.   Blood that collects in the tissue (hematoma) may be painful to the touch. It should usually decrease in size and tenderness within 1 to 2 weeks.  SEEK IMMEDIATE MEDICAL CARE IF:  You have unusual pain at the radial site.   You have redness, warmth, swelling, or pain at the radial site.   You have drainage (other than a small amount of blood on the dressing).   You have chills.   You have a fever or persistent symptoms for more than 72 hours.   You have a fever and your symptoms suddenly get worse.   Your arm becomes pale, cool, tingly, or numb.   You have heavy bleeding from the site. Hold pressure on the site.

## 2023-08-07 ENCOUNTER — Telehealth: Payer: Self-pay | Admitting: *Deleted

## 2023-08-07 LAB — LIPOPROTEIN A (LPA): Lipoprotein (a): 47.5 nmol/L — ABNORMAL HIGH (ref <75.0)

## 2023-08-07 NOTE — Progress Notes (Unsigned)
Cardiology Office Note:    Date:  08/07/2023   ID:  Susan Davidson, DOB 19-Sep-1951, MRN [id number]  PCP:  Corwin Levins, MD  Cardiologist:  Christell Constant, MD { Click to update primary MD,subspecialty MD or APP then REFRESH:1}    Referring MD: Corwin Levins, MD   Chief Complaint: hospital follow-up of NSTEMI  History of Present Illness:    Susan Davidson is a 72 y.o. female with a history of CAD with recent NSTEMI on 08/02/2023 (treated medically), CVA in 01/2021, cerebral aneurysm (5mm ACA aneurysm noted on brain MIR in 05/2023), hypertension, hyperlipidemia, CKD stage IIIa, and dementia who is followed by Dr. Izora Ribas and presents today for hospital follow-up of NSTEMI.   Patient was first seen by Cardiology during recent hospitalization. She was admitted from 08/02/2023 to 08/05/2023 for NSTEMI after presenting with chest pain. High-sensitivity troponin peaked at 2,903. Echo showed LVEF of 60-65% with no regional wall motion abnormalities and grade 1 diastolic dysfunction. LHC showed 95% stenosis of mid RCA with otherwise only minimal disease. Medical therapy was recommend given tortuosity and calcification of vessel. He was started on DAPT with Aspirin and Plavix. He was also started on Imdur and Lopressor and home Crestor was increased. Post-procedure, he developed a large hematoma of her right arm extending up 2/3 of her arm. BP cuff was inflated over the hematoma. Vascular Surgery was consulted and there was felt to be no evidence of compartment syndrome. Arterial ultrasound showed no evidence of pseudoaneurysm or AVF. Vascular Surgery recommended compression bandage and elevation.   Patient presents today for follow-up. ***  CAD with Recent NSTEMI Patient was recently admitted with a NSTEMI. LHC showed 95% stenosis of mid RCA with otherwise only minimal disease. Medical therapy was recommend given tortuosity and calcification of vessel.  - No recurrent chest pain.  -  Right arm hematoma *** - Continue Imdur 90mg  daily and Lopressor 12.5mg  twice daily.  - Continue DAPT with Aspirin and Plavix.  - Continue high-intensity statin.  - Will repeat BMET and CBC today. ***  Hypertension BP well controlled. *** - Continue Imdur 90mg  daily.  - Continue Lopressor 12.5mg  twice dialy.   Hyperlipidemia Lipid panel on 08/03/2023 during recent admission: Total Cholesterol 146, Triglycerides 92, HDL 39, LDL 89. LDL goal <55 given history of MI and stroke.  - Crestor was increased to 20mg  daily during recent admission. Continue.  - Will repeat lipid panel and LFTs in 6-8 weeks. ***  CKD Stage IIIa Baseline creatinine around 0.7 to 1.1. Creatinine 1.18 on recent discharge.  - Will repeat BMET today. ***  EKGs/Labs/Other Studies Reviewed:    The following studies were reviewed:  Echocardiogram 08/03/2023: Impressions: 1. Left ventricular ejection fraction, by estimation, is 60 to 65%. The  left ventricle has normal function. The left ventricle has no regional  wall motion abnormalities. Left ventricular diastolic parameters are  consistent with Grade I diastolic  dysfunction (impaired relaxation).   2. Right ventricular systolic function is normal. The right ventricular  size is normal. Tricuspid regurgitation signal is inadequate for assessing  PA pressure.   3. A small pericardial effusion is present.   4. The mitral valve is normal in structure. No evidence of mitral valve  regurgitation.   5. The aortic valve was not well visualized. Aortic valve regurgitation  is not visualized.   6. Aortic not well visualized.   7. The inferior vena cava is normal in size with greater than 50%  respiratory variability, suggesting right atrial pressure of 3 mmHg.  _______________   Left Cardiac Catheterization 08/03/2023:   Mid RCA lesion is 95% stenosed.   1.  High-grade mid right coronary artery lesion.  After review with Dr. Izora Ribas and given its  tortuosity and calcification in the context of the patient's comorbidities we have elected to pursue medical therapy.  If the patient is anginal symptoms cannot be controlled PCI can be considered. 2.  Mild disease of small caliber LAD and left circumflex. 3.  LVEDP of 10 mmHg:   Summary: Medical therapy for acute coronary syndrome.  A Plavix load was ordered.  If the patient's chest pain cannot be controlled with medical therapy PCI can be considered.   Diagnostic Dominance: Right       EKG:  EKG not ordered today.  Recent Labs: 07/14/2023: TSH 3.810 08/03/2023: ALT 14; B Natriuretic Peptide 186.7; Magnesium 2.3 08/05/2023: BUN 16; Creatinine, Ser 1.18; Hemoglobin 11.6; Platelets 213; Potassium 4.4; Sodium 139  Recent Lipid Panel    Component Value Date/Time   CHOL 146 08/03/2023 1813   TRIG 92 08/03/2023 1813   HDL 39 (L) 08/03/2023 1813   CHOLHDL 3.7 08/03/2023 1813   VLDL 18 08/03/2023 1813   LDLCALC 89 08/03/2023 1813   LDLDIRECT 137.5 10/19/2012 1106    Physical Exam:    Vital Signs: There were no vitals taken for this visit.    Wt Readings from Last 3 Encounters:  08/03/23 138 lb 7.2 oz (62.8 kg)  07/31/23 140 lb (63.5 kg)  06/16/23 140 lb (63.5 kg)     General: 72 y.o. female in no acute distress. HEENT: Normocephalic and atraumatic. Sclera clear.  Neck: Supple. No carotid bruits. No JVD. Heart: *** RRR. Distinct S1 and S2. No murmurs, gallops, or rubs.  Lungs: No increased work of breathing. Clear to ausculation bilaterally. No wheezes, rhonchi, or rales.  Abdomen: Soft, non-distended, and non-tender to palpation.  Extremities: No lower extremity edema.  Radial and distal pedal pulses 2+ and equal bilaterally. Skin: Warm and dry. Neuro: No focal deficits. Psych: Normal affect. Responds appropriately.   Assessment:    No diagnosis found.  Plan:     Disposition: Follow up in ***   Signed, Corrin Parker, PA-C  08/07/2023 7:14 PM    [facility]

## 2023-08-07 NOTE — Transitions of Care (Post Inpatient/ED Visit) (Signed)
   08/07/2023  Name: EMMALENE KATTNER MRN: [id number] DOB: 11-Aug-1951  Today's TOC FU Call Status: Today's TOC FU Call Status:: Unsuccessful Call (1st Attempt) Unsuccessful Call (1st Attempt) Date: 08/07/23  Attempted to reach the patient regarding the most recent Inpatient visit; left HIPAA compliant voice message requesting call back  Follow Up Plan: Additional outreach attempts will be made to reach the patient to complete the Transitions of Care (Post Inpatient visit) call.   Caryl Pina, RN, BSN, Media planner  Transitions of Care  [facility] [phone]: direct office

## 2023-08-08 ENCOUNTER — Telehealth: Payer: Self-pay | Admitting: *Deleted

## 2023-08-08 NOTE — Transitions of Care (Post Inpatient/ED Visit) (Signed)
   08/08/2023  Name: KAILONI VAHLE MRN: [id number] DOB: 1951-01-14  Today's TOC FU Call Status: Today's TOC FU Call Status:: Unsuccessful Call (2nd Attempt) Unsuccessful Call (2nd Attempt) Date: 08/08/23  Attempted to reach the patient regarding the most recent Inpatient visit; left HIPAA compliant voice message requesting call back  Received voice message from patient's spouse/ caregiver, after earlier call from me this morning-- attempted both numbers on file for patient--- both went to voice mail-- left messages on both numbers; explained I would place one more additional attempt tomorrow, encouraged caregiver to listen out for tomorrow's call  Follow Up Plan: Additional outreach attempts will be made to reach the patient to complete the Transitions of Care (Post Inpatient visit) call.   Caryl Pina, RN, BSN, Media planner  Transitions of Care  [facility] [phone]: direct office

## 2023-08-08 NOTE — Transitions of Care (Post Inpatient/ED Visit) (Signed)
   08/08/2023  Name: Susan Davidson MRN: [id number] DOB: 08/06/1951  Today's TOC FU Call Status: Today's TOC FU Call Status:: Unsuccessful Call (2nd Attempt) Unsuccessful Call (2nd Attempt) Date: 08/08/23  Attempted to reach the patient regarding the most recent Inpatient visit; left HIPAA compliant voice message requesting call back  Follow Up Plan: Additional outreach attempts will be made to reach the patient to complete the Transitions of Care (Post Inpatient visit) call.   Caryl Pina, RN, BSN, Media planner  Transitions of Care  [facility] [phone]: direct office

## 2023-08-09 ENCOUNTER — Telehealth: Payer: Self-pay | Admitting: *Deleted

## 2023-08-09 DIAGNOSIS — Z48812 Encounter for surgical aftercare following surgery on the circulatory system: Secondary | ICD-10-CM | POA: Diagnosis not present

## 2023-08-09 DIAGNOSIS — I1310 Hypertensive heart and chronic kidney disease without heart failure, with stage 1 through stage 4 chronic kidney disease, or unspecified chronic kidney disease: Secondary | ICD-10-CM | POA: Diagnosis not present

## 2023-08-09 DIAGNOSIS — F32A Depression, unspecified: Secondary | ICD-10-CM | POA: Diagnosis not present

## 2023-08-09 DIAGNOSIS — I214 Non-ST elevation (NSTEMI) myocardial infarction: Secondary | ICD-10-CM | POA: Diagnosis not present

## 2023-08-09 DIAGNOSIS — I259 Chronic ischemic heart disease, unspecified: Secondary | ICD-10-CM | POA: Diagnosis not present

## 2023-08-09 DIAGNOSIS — I69354 Hemiplegia and hemiparesis following cerebral infarction affecting left non-dominant side: Secondary | ICD-10-CM | POA: Diagnosis not present

## 2023-08-09 DIAGNOSIS — N1831 Chronic kidney disease, stage 3a: Secondary | ICD-10-CM | POA: Diagnosis not present

## 2023-08-09 DIAGNOSIS — F0393 Unspecified dementia, unspecified severity, with mood disturbance: Secondary | ICD-10-CM | POA: Diagnosis not present

## 2023-08-09 DIAGNOSIS — E039 Hypothyroidism, unspecified: Secondary | ICD-10-CM | POA: Diagnosis not present

## 2023-08-09 NOTE — Transitions of Care (Post Inpatient/ED Visit) (Signed)
   08/09/2023  Name: Susan Davidson MRN: [id number] DOB: [birth year]  Today's TOC FU Call Status: Today's TOC FU Call Status:: Unsuccessful Call (3rd Attempt) Unsuccessful Call (3rd Attempt) Date: 08/09/23 (attempted final TOC call to both phone numbers listed in EHR: left voice message on first attempt; alternate number has voice mail box that is full)  Attempted to reach the patient regarding the most recent Inpatient visit;  attempted final TOC call to both phone numbers listed in EHR: left voice message on first attempt; alternate number has voice mail box that is full   Follow Up Plan: No further outreach attempts will be made at this time. We have been unable to contact the patient.  Caryl Pina, RN, BSN, Media planner  Transitions of Care  [facility] - [facility] [phone]: direct office

## 2023-08-11 DIAGNOSIS — F0393 Unspecified dementia, unspecified severity, with mood disturbance: Secondary | ICD-10-CM | POA: Diagnosis not present

## 2023-08-11 DIAGNOSIS — I214 Non-ST elevation (NSTEMI) myocardial infarction: Secondary | ICD-10-CM | POA: Diagnosis not present

## 2023-08-11 DIAGNOSIS — I259 Chronic ischemic heart disease, unspecified: Secondary | ICD-10-CM | POA: Diagnosis not present

## 2023-08-11 DIAGNOSIS — N1831 Chronic kidney disease, stage 3a: Secondary | ICD-10-CM | POA: Diagnosis not present

## 2023-08-11 DIAGNOSIS — Z48812 Encounter for surgical aftercare following surgery on the circulatory system: Secondary | ICD-10-CM | POA: Diagnosis not present

## 2023-08-11 DIAGNOSIS — F32A Depression, unspecified: Secondary | ICD-10-CM | POA: Diagnosis not present

## 2023-08-11 DIAGNOSIS — I1310 Hypertensive heart and chronic kidney disease without heart failure, with stage 1 through stage 4 chronic kidney disease, or unspecified chronic kidney disease: Secondary | ICD-10-CM | POA: Diagnosis not present

## 2023-08-11 DIAGNOSIS — I69354 Hemiplegia and hemiparesis following cerebral infarction affecting left non-dominant side: Secondary | ICD-10-CM | POA: Diagnosis not present

## 2023-08-11 DIAGNOSIS — E039 Hypothyroidism, unspecified: Secondary | ICD-10-CM | POA: Diagnosis not present

## 2023-08-14 ENCOUNTER — Telehealth: Payer: Self-pay | Admitting: *Deleted

## 2023-08-14 NOTE — Telephone Encounter (Signed)
Patient was called and  message was left with her recent thyroid results and recommendations.

## 2023-08-17 ENCOUNTER — Encounter: Payer: Self-pay | Admitting: Student

## 2023-08-17 ENCOUNTER — Ambulatory Visit: Payer: Medicare HMO | Attending: Student | Admitting: Student

## 2023-08-17 VITALS — BP 128/65 | HR 57 | Ht 62.0 in

## 2023-08-17 DIAGNOSIS — I252 Old myocardial infarction: Secondary | ICD-10-CM

## 2023-08-17 DIAGNOSIS — I1310 Hypertensive heart and chronic kidney disease without heart failure, with stage 1 through stage 4 chronic kidney disease, or unspecified chronic kidney disease: Secondary | ICD-10-CM | POA: Diagnosis not present

## 2023-08-17 DIAGNOSIS — E785 Hyperlipidemia, unspecified: Secondary | ICD-10-CM

## 2023-08-17 DIAGNOSIS — I10 Essential (primary) hypertension: Secondary | ICD-10-CM

## 2023-08-17 DIAGNOSIS — Z48812 Encounter for surgical aftercare following surgery on the circulatory system: Secondary | ICD-10-CM | POA: Diagnosis not present

## 2023-08-17 DIAGNOSIS — I69354 Hemiplegia and hemiparesis following cerebral infarction affecting left non-dominant side: Secondary | ICD-10-CM | POA: Diagnosis not present

## 2023-08-17 DIAGNOSIS — I214 Non-ST elevation (NSTEMI) myocardial infarction: Secondary | ICD-10-CM | POA: Diagnosis not present

## 2023-08-17 DIAGNOSIS — N1831 Chronic kidney disease, stage 3a: Secondary | ICD-10-CM

## 2023-08-17 DIAGNOSIS — E039 Hypothyroidism, unspecified: Secondary | ICD-10-CM | POA: Diagnosis not present

## 2023-08-17 DIAGNOSIS — I2510 Atherosclerotic heart disease of native coronary artery without angina pectoris: Secondary | ICD-10-CM | POA: Diagnosis not present

## 2023-08-17 DIAGNOSIS — I259 Chronic ischemic heart disease, unspecified: Secondary | ICD-10-CM | POA: Diagnosis not present

## 2023-08-17 DIAGNOSIS — F32A Depression, unspecified: Secondary | ICD-10-CM | POA: Diagnosis not present

## 2023-08-17 DIAGNOSIS — F0393 Unspecified dementia, unspecified severity, with mood disturbance: Secondary | ICD-10-CM | POA: Diagnosis not present

## 2023-08-17 MED ORDER — NITROGLYCERIN 0.4 MG SL SUBL: 0.4000 mg | SUBLINGUAL_TABLET | SUBLINGUAL | 3 refills | Status: DC | PRN

## 2023-08-17 NOTE — Patient Instructions (Signed)
Medication Instructions:  Nitroglycerin .4mg /sl take as needed for chest pain.   *If you need a refill on your cardiac medications before your next appointment, please call your pharmacy*   Lab Work: CBC, BMET today   Testing/Procedures: NONE ordered at this time of appointment     Follow-Up: At [facility], you and your health needs are our priority.  As part of our continuing mission to provide you with exceptional heart care, we have created designated Provider Care Teams.  These Care Teams include your primary Cardiologist (physician) and Advanced Practice Providers (APPs -  Physician Assistants and Nurse Practitioners) who all work together to provide you with the care you need, when you need it.  We recommend signing up for the patient portal called "MyChart".  Sign up information is provided on this After Visit Summary.  MyChart is used to connect with patients for Virtual Visits (Telemedicine).  Patients are able to view lab/test results, encounter notes, upcoming appointments, etc.  Non-urgent messages can be sent to your provider as well.   To learn more about what you can do with MyChart, go to [email or web address].    Your next appointment:   3 month(s)  Provider:   Marjie Skiff, PA-C        Other Instructions

## 2023-08-18 DIAGNOSIS — I214 Non-ST elevation (NSTEMI) myocardial infarction: Secondary | ICD-10-CM | POA: Diagnosis not present

## 2023-08-18 DIAGNOSIS — E039 Hypothyroidism, unspecified: Secondary | ICD-10-CM | POA: Diagnosis not present

## 2023-08-18 DIAGNOSIS — F32A Depression, unspecified: Secondary | ICD-10-CM | POA: Diagnosis not present

## 2023-08-18 DIAGNOSIS — Z48812 Encounter for surgical aftercare following surgery on the circulatory system: Secondary | ICD-10-CM | POA: Diagnosis not present

## 2023-08-18 DIAGNOSIS — I1310 Hypertensive heart and chronic kidney disease without heart failure, with stage 1 through stage 4 chronic kidney disease, or unspecified chronic kidney disease: Secondary | ICD-10-CM | POA: Diagnosis not present

## 2023-08-18 DIAGNOSIS — N1831 Chronic kidney disease, stage 3a: Secondary | ICD-10-CM | POA: Diagnosis not present

## 2023-08-18 DIAGNOSIS — I259 Chronic ischemic heart disease, unspecified: Secondary | ICD-10-CM | POA: Diagnosis not present

## 2023-08-18 DIAGNOSIS — I69354 Hemiplegia and hemiparesis following cerebral infarction affecting left non-dominant side: Secondary | ICD-10-CM | POA: Diagnosis not present

## 2023-08-18 DIAGNOSIS — F0393 Unspecified dementia, unspecified severity, with mood disturbance: Secondary | ICD-10-CM | POA: Diagnosis not present

## 2023-08-18 LAB — CBC
Hematocrit: 42.5 % (ref 34.0–46.6)
Hemoglobin: 13.7 g/dL (ref 11.1–15.9)
MCH: 33.9 pg — ABNORMAL HIGH (ref 26.6–33.0)
MCHC: 32.2 g/dL (ref 31.5–35.7)
MCV: 105 fL — ABNORMAL HIGH (ref 79–97)
Platelets: 262 10*3/uL (ref 150–450)
RBC: 4.04 x10E6/uL (ref 3.77–5.28)
RDW: 13.5 % (ref 11.7–15.4)
WBC: 8 10*3/uL (ref 3.4–10.8)

## 2023-08-18 LAB — BASIC METABOLIC PANEL
BUN/Creatinine Ratio: 28 (ref 12–28)
BUN: 22 mg/dL (ref 8–27)
CO2: 23 mmol/L (ref 20–29)
Calcium: 9.6 mg/dL (ref 8.7–10.3)
Chloride: 106 mmol/L (ref 96–106)
Creatinine, Ser: 0.8 mg/dL (ref 0.57–1.00)
Glucose: 105 mg/dL — ABNORMAL HIGH (ref 70–99)
Potassium: 4.8 mmol/L (ref 3.5–5.2)
Sodium: 144 mmol/L (ref 134–144)
eGFR: 77 mL/min/{1.73_m2} (ref >59)

## 2023-08-21 ENCOUNTER — Telehealth: Payer: Self-pay

## 2023-08-21 ENCOUNTER — Ambulatory Visit: Payer: Medicare HMO | Admitting: Cardiology

## 2023-08-21 ENCOUNTER — Ambulatory Visit: Payer: Self-pay

## 2023-08-21 NOTE — Patient Outreach (Signed)
  Care Coordination   08/21/2023 Name: Susan Davidson MRN: [id number] DOB: 1951/01/10   Care Coordination Outreach Attempts:  An unsuccessful telephone outreach was attempted for a scheduled appointment today.  Follow Up Plan:  Additional outreach attempts will be made to offer the patient care coordination information and services.   Encounter Outcome:  No Answer   Care Coordination Interventions:  No, not indicated    Kathyrn Sheriff, RN, MSN, BSN, CCM Care Management Coordinator [phone]

## 2023-08-21 NOTE — Patient Instructions (Signed)
Visit Information  Thank you for taking time to visit with me today. Please don't hesitate to contact me if I can be of assistance to you.   Following are the goals we discussed today:  Continue to take medications as prescribed. Continue to attend provider visits as scheduled Continue to eat healthy, lean meats, vegetables, fruits, avoid saturated and transfats Contact provider with health questions or concerns as needed  Our next appointment is by telephone on 09/07/23 at 9:30 am  Please call the care guide team at [phone] if you need to cancel or reschedule your appointment.   If you are experiencing a Mental Health or Behavioral Health Crisis or need someone to talk to, please call the Suicide and Crisis Lifeline: 988 call the [place] National Suicide Prevention Lifeline: 725 722 3836 or TTY: (713) 133-3438 TTY 434-492-7419) to talk to a trained counselor call 1-800-273-TALK (toll free, 24 hour hotline)   Kathyrn Sheriff, RN, MSN, BSN, CCM Care Management Coordinator [phone]

## 2023-08-21 NOTE — Patient Outreach (Signed)
  Care Coordination   Follow Up Visit Note   08/21/2023 Name: Susan Davidson MRN: [id number] DOB: [birth year]  Susan Davidson is a 72 y.o. year old female who sees Susan Levins, MD for primary care. I spoke with  Susan Davidson by phone today.  What matters to the patients health and wellness today?  Admission 08/02/23-08/05/23 with NSTEMI. Susan Davidson reports patient is active with home health. Patient continues to have CAP worker for personal care support. Patient is also active with Palliative care. Susan Davidson reports patient is not sleeping well at night. He states she repeatedly awakens during the hours of 11 pm-4 am, with frequent request. Susan Davidson states she will settle into a sleep after 4am. He expresses his sleep is interrupted also. RNCM reached out to Palliative care nurse to seek recommendations. Spoke with Susan Rud, NP who states she will reach out to patient and Susan Davidson to discuss.  Goals Addressed             This Visit's Progress    Care Coordination Activities       Interventions Today    Flowsheet Row Most Recent Value  Chronic Disease   Chronic disease during today's visit Other, Chronic Kidney Disease/End Stage Renal Disease (ESRD), Hypertension (HTN)  General Interventions   General Interventions Discussed/Reviewed General Interventions Reviewed, Communication with  Communication with PCP/Specialists  [communication with Palliative [phone] Susan Rud, NP. re: patient difficulty sleeping through the night.]  Education Interventions   Education Provided Provided Education  Provided Verbal Education On Medication, Nutrition, When to see the doctor, Other  [discussed care giver stress-patient reports interupted sleep due to patient wakening hours]  Nutrition Interventions   Nutrition Discussed/Reviewed Nutrition Discussed, Supplemental nutrition  Pharmacy Interventions   Pharmacy Dicussed/Reviewed Pharmacy Topics Reviewed  Safety Interventions    Safety Discussed/Reviewed Safety Discussed, Fall Risk, Home Safety  Advanced Directive Interventions   End of Life Palliative  [reassed involvement with palliaitive care Susan Davidson) next telephone call scheduled 09/01/23]            SDOH assessments and interventions completed:  No  Care Coordination Interventions:  Yes, provided   Follow up plan: Follow up call scheduled for 09/07/23    Encounter Outcome:  Patient Visit Completed   Kathyrn Sheriff, RN, MSN, BSN, CCM Care Management Coordinator [phone]

## 2023-08-22 DIAGNOSIS — I1310 Hypertensive heart and chronic kidney disease without heart failure, with stage 1 through stage 4 chronic kidney disease, or unspecified chronic kidney disease: Secondary | ICD-10-CM | POA: Diagnosis not present

## 2023-08-22 DIAGNOSIS — I259 Chronic ischemic heart disease, unspecified: Secondary | ICD-10-CM | POA: Diagnosis not present

## 2023-08-22 DIAGNOSIS — Z48812 Encounter for surgical aftercare following surgery on the circulatory system: Secondary | ICD-10-CM | POA: Diagnosis not present

## 2023-08-22 DIAGNOSIS — E039 Hypothyroidism, unspecified: Secondary | ICD-10-CM | POA: Diagnosis not present

## 2023-08-22 DIAGNOSIS — F0393 Unspecified dementia, unspecified severity, with mood disturbance: Secondary | ICD-10-CM | POA: Diagnosis not present

## 2023-08-22 DIAGNOSIS — I69354 Hemiplegia and hemiparesis following cerebral infarction affecting left non-dominant side: Secondary | ICD-10-CM | POA: Diagnosis not present

## 2023-08-22 DIAGNOSIS — N1831 Chronic kidney disease, stage 3a: Secondary | ICD-10-CM | POA: Diagnosis not present

## 2023-08-22 DIAGNOSIS — I214 Non-ST elevation (NSTEMI) myocardial infarction: Secondary | ICD-10-CM | POA: Diagnosis not present

## 2023-08-22 DIAGNOSIS — F32A Depression, unspecified: Secondary | ICD-10-CM | POA: Diagnosis not present

## 2023-08-23 ENCOUNTER — Telehealth: Payer: Self-pay | Admitting: Internal Medicine

## 2023-08-23 DIAGNOSIS — I259 Chronic ischemic heart disease, unspecified: Secondary | ICD-10-CM | POA: Diagnosis not present

## 2023-08-23 DIAGNOSIS — I69354 Hemiplegia and hemiparesis following cerebral infarction affecting left non-dominant side: Secondary | ICD-10-CM | POA: Diagnosis not present

## 2023-08-23 DIAGNOSIS — N1831 Chronic kidney disease, stage 3a: Secondary | ICD-10-CM | POA: Diagnosis not present

## 2023-08-23 DIAGNOSIS — F0393 Unspecified dementia, unspecified severity, with mood disturbance: Secondary | ICD-10-CM | POA: Diagnosis not present

## 2023-08-23 DIAGNOSIS — I1310 Hypertensive heart and chronic kidney disease without heart failure, with stage 1 through stage 4 chronic kidney disease, or unspecified chronic kidney disease: Secondary | ICD-10-CM | POA: Diagnosis not present

## 2023-08-23 DIAGNOSIS — E039 Hypothyroidism, unspecified: Secondary | ICD-10-CM | POA: Diagnosis not present

## 2023-08-23 DIAGNOSIS — F32A Depression, unspecified: Secondary | ICD-10-CM | POA: Diagnosis not present

## 2023-08-23 DIAGNOSIS — Z48812 Encounter for surgical aftercare following surgery on the circulatory system: Secondary | ICD-10-CM | POA: Diagnosis not present

## 2023-08-23 DIAGNOSIS — I214 Non-ST elevation (NSTEMI) myocardial infarction: Secondary | ICD-10-CM | POA: Diagnosis not present

## 2023-08-23 NOTE — Telephone Encounter (Signed)
Ben from [facility] is requesting one additional visit next week to give caregiver education.   Please call Romeo Apple to confirm: [phone]

## 2023-08-23 NOTE — Telephone Encounter (Signed)
Ok for verbal 

## 2023-08-23 NOTE — Telephone Encounter (Signed)
Called and left voicemail giving verbals.

## 2023-08-25 ENCOUNTER — Telehealth: Payer: Self-pay | Admitting: Internal Medicine

## 2023-08-25 DIAGNOSIS — I214 Non-ST elevation (NSTEMI) myocardial infarction: Secondary | ICD-10-CM | POA: Diagnosis not present

## 2023-08-25 DIAGNOSIS — Z48812 Encounter for surgical aftercare following surgery on the circulatory system: Secondary | ICD-10-CM | POA: Diagnosis not present

## 2023-08-25 DIAGNOSIS — I1310 Hypertensive heart and chronic kidney disease without heart failure, with stage 1 through stage 4 chronic kidney disease, or unspecified chronic kidney disease: Secondary | ICD-10-CM | POA: Diagnosis not present

## 2023-08-25 DIAGNOSIS — F32A Depression, unspecified: Secondary | ICD-10-CM | POA: Diagnosis not present

## 2023-08-25 DIAGNOSIS — I259 Chronic ischemic heart disease, unspecified: Secondary | ICD-10-CM | POA: Diagnosis not present

## 2023-08-25 DIAGNOSIS — F0393 Unspecified dementia, unspecified severity, with mood disturbance: Secondary | ICD-10-CM | POA: Diagnosis not present

## 2023-08-25 DIAGNOSIS — E039 Hypothyroidism, unspecified: Secondary | ICD-10-CM | POA: Diagnosis not present

## 2023-08-25 DIAGNOSIS — N1831 Chronic kidney disease, stage 3a: Secondary | ICD-10-CM | POA: Diagnosis not present

## 2023-08-25 DIAGNOSIS — I69354 Hemiplegia and hemiparesis following cerebral infarction affecting left non-dominant side: Secondary | ICD-10-CM | POA: Diagnosis not present

## 2023-08-25 NOTE — Telephone Encounter (Signed)
Susan Davidson [facility] [phone]  - patient's heart rate is 54 at rest, 58 after exercise, and 56 after 5 minute rest  - Please call him back to confirm that this is acceptable rate

## 2023-08-25 NOTE — Telephone Encounter (Signed)
Yes, that is acceptable, thanks

## 2023-08-25 NOTE — Telephone Encounter (Signed)
Called and left voice mail

## 2023-08-28 ENCOUNTER — Other Ambulatory Visit: Payer: Self-pay

## 2023-08-28 DIAGNOSIS — I10 Essential (primary) hypertension: Secondary | ICD-10-CM

## 2023-08-28 DIAGNOSIS — E785 Hyperlipidemia, unspecified: Secondary | ICD-10-CM

## 2023-08-28 DIAGNOSIS — I2510 Atherosclerotic heart disease of native coronary artery without angina pectoris: Secondary | ICD-10-CM

## 2023-08-30 DIAGNOSIS — I214 Non-ST elevation (NSTEMI) myocardial infarction: Secondary | ICD-10-CM | POA: Diagnosis not present

## 2023-08-30 DIAGNOSIS — N1831 Chronic kidney disease, stage 3a: Secondary | ICD-10-CM | POA: Diagnosis not present

## 2023-08-30 DIAGNOSIS — Z48812 Encounter for surgical aftercare following surgery on the circulatory system: Secondary | ICD-10-CM | POA: Diagnosis not present

## 2023-08-30 DIAGNOSIS — F0393 Unspecified dementia, unspecified severity, with mood disturbance: Secondary | ICD-10-CM | POA: Diagnosis not present

## 2023-08-30 DIAGNOSIS — E039 Hypothyroidism, unspecified: Secondary | ICD-10-CM | POA: Diagnosis not present

## 2023-08-30 DIAGNOSIS — I259 Chronic ischemic heart disease, unspecified: Secondary | ICD-10-CM | POA: Diagnosis not present

## 2023-08-30 DIAGNOSIS — I69354 Hemiplegia and hemiparesis following cerebral infarction affecting left non-dominant side: Secondary | ICD-10-CM | POA: Diagnosis not present

## 2023-08-30 DIAGNOSIS — F32A Depression, unspecified: Secondary | ICD-10-CM | POA: Diagnosis not present

## 2023-08-30 DIAGNOSIS — I1310 Hypertensive heart and chronic kidney disease without heart failure, with stage 1 through stage 4 chronic kidney disease, or unspecified chronic kidney disease: Secondary | ICD-10-CM | POA: Diagnosis not present

## 2023-09-02 ENCOUNTER — Other Ambulatory Visit (HOSPITAL_COMMUNITY): Payer: Self-pay

## 2023-09-05 ENCOUNTER — Telehealth: Payer: Self-pay | Admitting: Internal Medicine

## 2023-09-05 DIAGNOSIS — R32 Unspecified urinary incontinence: Secondary | ICD-10-CM | POA: Diagnosis not present

## 2023-09-05 MED ORDER — ALPRAZOLAM 0.5 MG PO TABS: 0.5000 mg | ORAL_TABLET | Freq: Two times a day (BID) | ORAL | 0 refills | Status: DC | PRN

## 2023-09-05 NOTE — Telephone Encounter (Signed)
Okay.  Schedule office visit with Dr. Jonny Ruiz.  Thanks

## 2023-09-05 NOTE — Telephone Encounter (Signed)
Next OV is 10/02/2023.   Prescription Request  09/05/2023  LOV: 05/29/2023  What is the name of the medication or equipment? ALPRAZolam (XANAX) 0.5 MG tablet   Have you contacted your pharmacy to request a refill? Yes   Which pharmacy would you like this sent to?  [id number] - [place], [place] - [address] AT [facility] OF [address] [place] [address] Phone: [phone] Fax: [phone]    Patient notified that their request is being sent to the clinical staff for review and that they should receive a response within 2 business days.   Please advise at Mobile [phone] (mobile)

## 2023-09-06 NOTE — Telephone Encounter (Signed)
Pt scheduled to come in on 10/02/23.

## 2023-09-07 ENCOUNTER — Ambulatory Visit: Payer: Self-pay

## 2023-09-07 DIAGNOSIS — F0390 Unspecified dementia without behavioral disturbance: Secondary | ICD-10-CM

## 2023-09-07 NOTE — Patient Outreach (Signed)
  Care Coordination   Follow Up Visit Note   09/07/2023 Name: Susan Davidson MRN: [id number] DOB: [birth year]  Susan Davidson is a 72 y.o. year old female who sees Corwin Levins, MD for primary care. I spoke with spouse Radiation protection practitioner) by phone today.  What matters to the patients health and wellness today? Mr. Ogorek reports patient pneumonia is resolved. He states patient continues to awaken during the night. Mr. Tolleson reports palliative nurse reached out out to him, but he was not available. He states he needs to call and follow up with palliative care nurse. Patient has an upcoming appointment with primary care provider and gastroenterologist this month.  Goals Addressed             This Visit's Progress    Care Coordination Activities       Interventions Today    Flowsheet Row Most Recent Value  Chronic Disease   Chronic disease during today's visit Other, Chronic Kidney Disease/End Stage Renal Disease (ESRD), Hypertension (HTN)  [dementia]  General Interventions   General Interventions Discussed/Reviewed General Interventions Reviewed, Doctor Visits, [employer] of current treatment plan for health condition and patient's adherence to plan. Provided contact to [facility] ([phone])]  Doctor Visits Discussed/Reviewed PCP, Specialist  PCP/Specialist Visits Compliance with follow-up visit  [reviewed upcoming visits.]  Education Interventions   Education Provided Provided Education  Provided Verbal Education On Nutrition, When to see the doctor  [advised attend provider visits as scheduled, reach back out to palliaitive care nurse as needed, discussed caregiver stress and availability of SW to assist as needed]  Nutrition Interventions   Nutrition Discussed/Reviewed Nutrition Reviewed  Safety Interventions   Safety Discussed/Reviewed Safety Reviewed  Advanced Directive Interventions   End of Life Palliative            SDOH  assessments and interventions completed:  No  Care Coordination Interventions:  Yes, provided   Follow up plan: Follow up call scheduled for 10/11/22    Encounter Outcome:  Patient Visit Completed   Kathyrn Sheriff, RN, MSN, BSN, CCM Care Management Coordinator [phone]

## 2023-09-07 NOTE — Patient Instructions (Signed)
Visit Information  Thank you for taking time to visit with me today. Please don't hesitate to contact me if I can be of assistance to you.   Following are the goals we discussed today:  Continue to take medications as prescribed. Continue to attend provider visits as scheduled Follow up with Palliative care Provider Continue to eat healthy, lean meats, vegetables, fruits, avoid saturated and transfats Contact provider with health questions or concerns as needed   Our next appointment is by telephone on 10/12/23 at 09:30 am  Please call the care guide team at [phone] if you need to cancel or reschedule your appointment.   If you are experiencing a Mental Health or Behavioral Health Crisis or need someone to talk to, please call the Suicide and Crisis Lifeline: 988 call the [place] National Suicide Prevention Lifeline: 717-169-1541 or TTY: 808-184-4110 TTY 352 122 1922) to talk to a trained counselor   Kathyrn Sheriff, RN, MSN, BSN, CCM Care Management Coordinator [phone]

## 2023-09-11 ENCOUNTER — Telehealth: Payer: Self-pay | Admitting: *Deleted

## 2023-09-11 NOTE — Progress Notes (Unsigned)
  Care Coordination  Outreach Note  09/11/2023 Name: MARIBEL ELAM MRN: [id number] DOB: 1951/03/03   Care Coordination Outreach Attempts: An unsuccessful telephone outreach was attempted today to offer the patient information about available care coordination services.  Follow Up Plan:  Additional outreach attempts will be made to offer the patient care coordination information and services.   Encounter Outcome:  No Answer  Burman Nieves, CCMA Care Coordination Care Guide Direct Dial: [phone]

## 2023-09-12 NOTE — Progress Notes (Unsigned)
  Care Coordination  Outreach Note  09/12/2023 Name: Susan Davidson MRN: [id number] DOB: [birth year]   Care Coordination Outreach Attempts: A second unsuccessful outreach was attempted today to offer the patient with information about available care coordination services.  Follow Up Plan:  Additional outreach attempts will be made to offer the patient care coordination information and services.   Encounter Outcome:  No Answer  Burman Nieves, CCMA Care Coordination Care Guide Direct Dial: [phone]

## 2023-09-13 NOTE — Progress Notes (Signed)
  Care Coordination  Outreach Note  09/13/2023 Name: JAEDAH MCPHERON MRN: [id number] DOB: Feb 10, 1951   Care Coordination Outreach Attempts: A third unsuccessful outreach was attempted today to offer the patient with information about available care coordination services.  Follow Up Plan:  No further outreach attempts will be made at this time. We have been unable to contact the patient to offer or enroll patient in care coordination services  Encounter Outcome:  No Answer  Burman Nieves, [facility] Care Coordination Care Guide Direct Dial: [phone]

## 2023-09-25 ENCOUNTER — Other Ambulatory Visit: Payer: Self-pay | Admitting: *Deleted

## 2023-09-25 DIAGNOSIS — E890 Postprocedural hypothyroidism: Secondary | ICD-10-CM

## 2023-09-25 MED ORDER — LEVOTHYROXINE SODIUM 75 MCG PO TABS: 75.0000 ug | ORAL_TABLET | Freq: Every day | ORAL | 11 refills | Status: DC

## 2023-09-29 ENCOUNTER — Other Ambulatory Visit (INDEPENDENT_AMBULATORY_CARE_PROVIDER_SITE_OTHER): Payer: Self-pay | Admitting: *Deleted

## 2023-09-29 DIAGNOSIS — K5904 Chronic idiopathic constipation: Secondary | ICD-10-CM

## 2023-09-29 MED ORDER — LINACLOTIDE 145 MCG PO CAPS: 145.0000 ug | ORAL_CAPSULE | Freq: Every day | ORAL | 1 refills | Status: DC

## 2023-10-02 ENCOUNTER — Encounter: Payer: Self-pay | Admitting: Internal Medicine

## 2023-10-02 ENCOUNTER — Ambulatory Visit (INDEPENDENT_AMBULATORY_CARE_PROVIDER_SITE_OTHER): Payer: Medicare HMO | Admitting: Internal Medicine

## 2023-10-02 VITALS — BP 126/72 | HR 60 | Temp 97.9°F | Ht 62.0 in

## 2023-10-02 DIAGNOSIS — M5090 Cervical disc disorder, unspecified, unspecified cervical region: Secondary | ICD-10-CM | POA: Insufficient documentation

## 2023-10-02 DIAGNOSIS — I639 Cerebral infarction, unspecified: Secondary | ICD-10-CM | POA: Diagnosis not present

## 2023-10-02 DIAGNOSIS — E538 Deficiency of other specified B group vitamins: Secondary | ICD-10-CM | POA: Diagnosis not present

## 2023-10-02 DIAGNOSIS — F411 Generalized anxiety disorder: Secondary | ICD-10-CM

## 2023-10-02 DIAGNOSIS — I10 Essential (primary) hypertension: Secondary | ICD-10-CM | POA: Diagnosis not present

## 2023-10-02 DIAGNOSIS — Z515 Encounter for palliative care: Secondary | ICD-10-CM | POA: Diagnosis not present

## 2023-10-02 DIAGNOSIS — N1831 Chronic kidney disease, stage 3a: Secondary | ICD-10-CM | POA: Diagnosis not present

## 2023-10-02 DIAGNOSIS — E559 Vitamin D deficiency, unspecified: Secondary | ICD-10-CM | POA: Diagnosis not present

## 2023-10-02 DIAGNOSIS — R7302 Impaired glucose tolerance (oral): Secondary | ICD-10-CM

## 2023-10-02 DIAGNOSIS — E782 Mixed hyperlipidemia: Secondary | ICD-10-CM | POA: Diagnosis not present

## 2023-10-02 DIAGNOSIS — I69354 Hemiplegia and hemiparesis following cerebral infarction affecting left non-dominant side: Secondary | ICD-10-CM | POA: Diagnosis not present

## 2023-10-02 DIAGNOSIS — E039 Hypothyroidism, unspecified: Secondary | ICD-10-CM

## 2023-10-02 MED ORDER — ALPRAZOLAM 0.5 MG PO TABS: 0.5000 mg | ORAL_TABLET | Freq: Two times a day (BID) | ORAL | 2 refills | Status: DC | PRN

## 2023-10-02 NOTE — Patient Instructions (Signed)
Please continue all other medications as before, and refills have been done if requested - alprazolam  Please have the pharmacy call with any other refills you may need.  Please continue your efforts at being more active, low cholesterol diet, and weight control.  Please keep your appointments with your specialists as you may have planned  [facility] to change the Jan 8 appt to April 2025

## 2023-10-03 ENCOUNTER — Encounter: Payer: Self-pay | Admitting: Internal Medicine

## 2023-10-03 ENCOUNTER — Ambulatory Visit (INDEPENDENT_AMBULATORY_CARE_PROVIDER_SITE_OTHER): Payer: Medicare HMO | Admitting: Gastroenterology

## 2023-10-03 VITALS — BP 106/59 | HR 60 | Ht 62.0 in

## 2023-10-03 DIAGNOSIS — R918 Other nonspecific abnormal finding of lung field: Secondary | ICD-10-CM

## 2023-10-03 DIAGNOSIS — K5904 Chronic idiopathic constipation: Secondary | ICD-10-CM

## 2023-10-03 DIAGNOSIS — D539 Nutritional anemia, unspecified: Secondary | ICD-10-CM

## 2023-10-03 DIAGNOSIS — R630 Anorexia: Secondary | ICD-10-CM

## 2023-10-03 DIAGNOSIS — K5900 Constipation, unspecified: Secondary | ICD-10-CM

## 2023-10-03 NOTE — Assessment & Plan Note (Signed)
 Lab Results  Component Value Date   TSH 3.810 07/14/2023   Stable, pt to continue levothyroxine 75 mcg qd

## 2023-10-03 NOTE — Progress Notes (Signed)
 Clancey Welton Faizan Supriya Beaston , M.D. Gastroenterology & Hepatology [facility] Gastroenterology [address] [place], [place] [address] Primary Care Physician: Norleen Lynwood ORN, MD [address] [place] [address]  Chief Complaint:  constipation and loss of appetite   History of Present Illness: Susan Davidson is a 72 y.o. 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  Patient is accompanied by her husband in the clinic.  Patient appears to be slow in mentation and appears to have mild cognitive impairment Since in the clinic patient suffered NSTEMI with high-grade RCA lesion, treated medically   Patient has been report since starting Linzess  patient is having bowel movement daily he would give patient Linzess  5 times a week. The patient and husband  denies having any nausea, vomiting, fever, chills, hematochezia, melena, hematemesis,  jaundice, pruritus or weight loss.  She eats cereal and eggs sandwich and takes ensure .  Patient has been still not satisfied with the amount of food patient is eating  Last EGD: None  Last Colonoscopy:None  FHx: neg for any gastrointestinal/liver disease, no malignancies Social: neg smoking, alcohol or illicit drug use  Past Medical History: Past Medical History:  Diagnosis Date   ANXIETY 02/12/2008   Qualifier: Diagnosis of  By: Norleen MD, Lynwood ORN    CVA (cerebral vascular accident) [facility])    DEPRESSION 02/12/2008   Qualifier: Diagnosis of  By: Helga Almarie Caldron    HYPERLIPIDEMIA 02/12/2008   Qualifier: Diagnosis of  By: Helga Almarie Caldron    HYPERTENSION 02/12/2008   Qualifier: Diagnosis of  By: Helga Almarie Caldron    HYPOTHYROIDISM 02/12/2008   Qualifier: Diagnosis of  By: Helga Almarie Caldron    Impaired glucose tolerance 08/27/2011   Left hemiparesis (HCC)    OSTEOPENIA  02/12/2008   Qualifier: Diagnosis of  By: Norleen MD, Lynwood ORN    VITAMIN D  DEFICIENCY 04/14/2010   Qualifier: Diagnosis of  By: Norleen MD, Lynwood ORN     Past Surgical History: Past Surgical History:  Procedure Laterality Date   IR RADIOLOGIST EVAL & MGMT  08/18/2021   LEFT HEART CATH AND CORONARY ANGIOGRAPHY N/A 08/03/2023   Procedure: LEFT HEART CATH AND CORONARY ANGIOGRAPHY;  Surgeon: Wendel Lurena POUR, MD;  Location: [facility] INVASIVE CV LAB;  Service: Cardiovascular;  Laterality: N/A;    Family History: Family History  Problem Relation Age of Onset   Heart disease Father    Bipolar disorder Sister    Diabetes Neg Hx     Social History: Social History   Tobacco Use  Smoking Status Former   Current packs/day: 0.00   Types: Cigarettes   Quit date: 01/26/2021   Years since quitting: 2.6  Smokeless Tobacco Never   Social History   Substance and Sexual Activity  Alcohol Use Not Currently   Social History   Substance and Sexual Activity  Drug Use No    Allergies: Allergies  Allergen Reactions   Aleve [Naproxen] Nausea Only   Fire Ant (Solenopsis Invicta) Anaphylaxis   Covid-19 Mrna Vaccine (Pfizer) [Covid-19 Mrna Vacc (Moderna)]     Just the first round of moderna Covid vaccine, sluggish, uncoordinated, and weak, similar to flu.   Influenza Vac Split Quad     Fatigue, mimics a super bad flu    Medications: Current Outpatient Medications  Medication Sig Dispense Refill   acetaminophen  (TYLENOL )  325 MG tablet Take 2 tablets (650 mg total) by mouth every 6 (six) hours as needed for mild pain or headache (fever >/= 101). 12 tablet 2   albuterol  (VENTOLIN  HFA) 108 (90 Base) MCG/ACT inhaler Inhale 2 puffs into the lungs every 6 (six) hours as needed for wheezing or shortness of breath. 17 each 2   alendronate  (FOSAMAX ) 70 MG tablet Take 1 tablet (70 mg total) by mouth every 7 (seven) days. Take with a full glass of water on an empty stomach. 12 tablet 3   ALPRAZolam  (XANAX ) 0.5  MG tablet Take 1 tablet (0.5 mg total) by mouth 2 (two) times daily as needed for anxiety. TAKE 1 TABLET(0.5 MG) BY MOUTH TWICE DAILY AS NEEDED FOR ANXIETY Strength: 0.5 mg 60 tablet 2   ascorbic acid  (VITAMIN C) 500 MG tablet Take 1 tablet (500 mg total) by mouth daily. (Patient taking differently: Take 500 mg by mouth at bedtime.) 30 tablet 2   aspirin  EC 81 MG tablet Take 1 tablet (81 mg total) by mouth daily. Swallow whole. 30 tablet 12   Cholecalciferol (VITAMIN D -3 PO) Take 1 capsule by mouth at bedtime.     clopidogrel  (PLAVIX ) 75 MG tablet Take 1 tablet (75 mg total) by mouth daily. 90 tablet 3   divalproex  (DEPAKOTE ) 250 MG DR tablet Take 1 tablet by mouth See admin instructions. 1 tablet in the morning and 2 tablets every evening     folic acid  (FOLVITE ) 800 MCG tablet Take 400 mcg by mouth at bedtime.     isosorbide  mononitrate (IMDUR ) 30 MG 24 hr tablet Take 3 tablets (90 mg total) by mouth daily. 90 tablet 2   lactose free nutrition (BOOST) LIQD Take 237 mLs by mouth 2 (two) times daily between meals. At least one a day and sometimes more     levothyroxine  (SYNTHROID ) 75 MCG tablet Take 1 tablet (75 mcg total) by mouth daily. 30 tablet 11   linaclotide  (LINZESS ) 145 MCG CAPS capsule Take 1 capsule (145 mcg total) by mouth daily. 90 capsule 1   melatonin 5 MG TABS Take 5 mg by mouth at bedtime.     memantine  (NAMENDA ) 5 MG tablet Take 1 tablet (5 mg total) by mouth 2 (two) times daily. 180 tablet 3   metoprolol  tartrate (LOPRESSOR ) 25 MG tablet Take 0.5 tablets (12.5 mg total) by mouth 2 (two) times daily. 30 tablet 2   Multiple Vitamin (MULTIVITAMIN) tablet Take 1 tablet by mouth at bedtime.     nitroGLYCERIN  (NITROSTAT ) 0.4 MG SL tablet Place 1 tablet (0.4 mg total) under the tongue every 5 (five) minutes as needed for chest pain. 25 tablet 3   OVER THE COUNTER MEDICATION Take 1 tablet by mouth at bedtime. magnesium     pantoprazole  (PROTONIX ) 40 MG tablet Take 1 tablet (40 mg total)  by mouth daily. 90 tablet 3   PARoxetine  (PAXIL ) 40 MG tablet Take 40 mg by mouth every morning.     rosuvastatin  (CRESTOR ) 20 MG tablet Take 1 tablet (20 mg total) by mouth at bedtime. 30 tablet 2   No current facility-administered medications for this visit.    Review of Systems: GENERAL: negative for malaise, night sweats HEENT: No changes in hearing or vision, no nose bleeds or other nasal problems. NECK: Negative for lumps, goiter, pain and significant neck swelling RESPIRATORY: Negative for cough, wheezing CARDIOVASCULAR: Negative for chest pain, leg swelling, palpitations, orthopnea GI: SEE HPI MUSCULOSKELETAL: Negative for joint pain or swelling, back  pain, and muscle pain. SKIN: Negative for lesions, rash HEMATOLOGY Negative for prolonged bleeding, bruising easily, and swollen nodes. ENDOCRINE: Negative for cold or heat intolerance, polyuria, polydipsia and goiter. NEURO: negative for tremor, gait imbalance, syncope and seizures. The remainder of the review of systems is noncontributory.   Physical Exam: BP (!) 106/59   Pulse 60   Ht 5' 2 (1.575 m)   BMI 25.32 kg/m  GENERAL: The patient is AO x2, Wheelchair bound HEENT: Head is normocephalic and atraumatic. EOMI are intact. Mouth is well hydrated and without lesions. NECK: Supple. No masses LUNGS: Clear to auscultation. No presence of rhonchi/wheezing/rales. Adequate chest expansion HEART: RRR, normal s1 and s2. ABDOMEN: Soft, nontender, no guarding, no peritoneal signs, and nondistended. BS +. No masses. EXTREMITIES: Without any cyanosis, clubbing, rash, lesions or edema.   Imaging/Labs: as above  I personally reviewed and interpreted the available labs, imaging and endoscopic files.  CT CAP  Motion artifact.   No bowel obstruction, free air or free fluid. Scattered stool. Normal appendix.   Diffuse atherosclerotic changes. 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 Please correlate  with any particular symptoms.   Elevated right hemidiaphragm.    HBG: 16 -->11  Abdomen xray  IMPRESSION: Large amount of stool throughout the colon.   Bilateral lower lobe consolidative lung opacities, right-greater-than-left. Acute infiltrate. Recommend follow-up.   There is also an enlarged subcarinal lymph node which could be reactive and attention on follow up as well.   Motion artifact.   No bowel obstruction, free air or free fluid. Scattered stool. Normal appendix.   Diffuse atherosclerotic changes. 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 Please correlate with any particular symptoms.   Elevated right hemidiaphragm.      Impression and Plan:  Susan Davidson is a 72 y.o. 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  #Constipation -improved   Patient is wheelchair-bound, xray with large stool burden .Also patient has underlying hypothyroidism Benign abdominal exam,, and no dysphagia, hematochezia  Adequate response with Linzess  145 mcg daily .  Previously patient unable to tolerate Metamucil and MiraLAX   #Loss of appetite   As per husband since stroke patient has been deconditioning with loss of appetite.  Imaging from 05/17/2023 CT chest abdomen pelvis with contrast without any malignancy, but showed bilateral lung opacities and large subcarinal lymph node  Labs from 05/18/2023 appears to have new macrocytic anemia with hemoglobin 11.8 MCV 104 which now has improved with hemoglobin of 13  Discussed with patient husband that loss of appetite and macrocytic anemia are alarm symptom in a patient with age 73.  Without upper endoscopy I cannot rule out malignancy  Discussed risk benefit limitation and alternatives to upper endoscopy.  Patient husband  would like to defer upper endoscopy at this time given patient  age and comorbidities especially with recent NSTEMI and high-grade coronary artery stenosis treated medically.  I reiterated to the patient husband that without upper endoscopy I cannot rule out malignancy   #Macrocytic Anemia -improved  Patient with hemoglobin 11.8---->13.7 MCV 104  #HCM Patient with advanced age, stroke wheelchair-bound.  Discussed indication risk benefit of screening colonoscopy.  Patient appeared to have mild cognitive impairment and is slowing mentation.  Decision is made by the husband to forego any screening colonoscopy at this time given patient age and comorbid conditions  All questions were answered.      Danasha Melman Faizan Joaquina Nissen, MD Gastroenterology and Hepatology [facility] Gastroenterology

## 2023-10-03 NOTE — Patient Instructions (Signed)
 It was very nice to meet you today, as dicussed with will plan for the following :  1) continue linzess, if having a lot diarrhea than can decrease dose to half

## 2023-10-03 NOTE — Assessment & Plan Note (Signed)
 Lab Results  Component Value Date   VITAMINB12 1,019 (H) 08/03/2023   Stable, cont oral replacement - b12 1000 mcg qd

## 2023-10-03 NOTE — Assessment & Plan Note (Signed)
 BP Readings from Last 3 Encounters:  10/03/23 (!) 106/59  10/02/23 126/72  08/17/23 128/65   Stable, pt to continue medical treatment imdur 30 every day, lopressor 12.5 bid

## 2023-10-03 NOTE — Assessment & Plan Note (Signed)
Lab Results  Component Value Date   HGBA1C 6.3 04/10/2023   Stable, pt to continue current medical treatment  - diet, wt control

## 2023-10-03 NOTE — Assessment & Plan Note (Signed)
Last vitamin D Lab Results  Component Value Date   VD25OH 84.27 04/10/2023   Stable, cont oral replacement

## 2023-10-03 NOTE — Assessment & Plan Note (Signed)
Overall stable, pt to continue xanax prn

## 2023-10-03 NOTE — Assessment & Plan Note (Signed)
 Lab Results  Component Value Date   LDLCALC 89 08/03/2023   Uncontrolled, for lower chol diet,, pt to continue current statin crestor 20 every day, and has plan for f/u lab in feb 2025 with cardiology

## 2023-10-03 NOTE — Assessment & Plan Note (Signed)
 Lab Results  Component Value Date   CREATININE 0.80 08/17/2023   Stable overall, cont to avoid nephrotoxins

## 2023-10-05 ENCOUNTER — Other Ambulatory Visit: Payer: Self-pay

## 2023-10-05 DIAGNOSIS — E890 Postprocedural hypothyroidism: Secondary | ICD-10-CM

## 2023-10-05 MED ORDER — LEVOTHYROXINE SODIUM 75 MCG PO TABS: 75.0000 ug | ORAL_TABLET | Freq: Every day | ORAL | 1 refills | Status: DC

## 2023-10-06 DIAGNOSIS — R32 Unspecified urinary incontinence: Secondary | ICD-10-CM | POA: Diagnosis not present

## 2023-10-06 NOTE — Progress Notes (Deleted)
 No chief complaint on file.     ASSESSMENT AND PLAN  Susan Davidson is a 73 y.o. 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, with residual left lower extremity more than left upper extremity weakness, Known history of anterior communicating 5 mm aneurysm  Slow worsening functional status, new development of left upper extremity fixed contraction of left elbow, left hand intrinsic muscle atrophy, weakness, abnormal neck posturing, brisk reflexes,  MRI C-spine 03/2023 possible left C4 pinched nerve  MRA head/neck 03/2023 left ICA 35% stenosis, right ICA 20% stenosis, similar compared to prior imaging in 2022  Continue Plavix  and Crestor  20 mg daily for secondary stroke prevention measures managed/prescribed by PCP  Return to clinic with nurse practitioner in 4 to 6 months  DIAGNOSTIC DATA (LABS, IMAGING, TESTING) - I reviewed patient records, labs, notes, testing and imaging myself where available.   MEDICAL HISTORY:   Update 10/08/2022 JM: Patient returns for follow-up visit.  MRI C-spine showed possible left C4 pinched nerve, was referred to neurosurgery for further recommendations.   Of note, she was hospitalized from 08/02/2023 to 08/05/2023 for NSTEMI, was started on DAPT, Imdur  and Lopressor  and home dose Crestor  increased.  Routinely follows with cardiology.    Consult visit 12/12/2022 Dr. Onita: Susan Davidson, is a 73 year old female, accompanied by her husband seen in request by her primary care Dr. Lynwood Rush for evaluation of stroke, previously seen by Dr. Milton Lade, MD, initial evaluation was with her husband on December 12, 2022  I reviewed and summarized the referring note.  Past medical history Dementia Hypertension Depression Hyperlipidemia  She was admitted to hospital on January 06, 2021 for acute onset of left-sided weakness, dysarthria, MRI confirmed stroke involving right frontal parasagittal section, MRA of the brain confirmed 5 mm  aneurysm  Personally reviewed MRI of the brain in April 2023, chronic infarction of right frontal/parietal parasagittal region, small vessel disease, motion artifact  MRI of the brain in April 2022,, acute infarction involving right ACA,,  MRA of the brain showed 5 mm anterior communicating aneurysm, 2 mm outpouching from basilar artery, 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, CT angiogram of head and neck confirmed 5 mm anterior communicating artery aneurysm Echocardiogram showed no significant abnormality  She had a prolonged rehabilitation stay, also multiple hospital readmission for mental status change, not eating well, COVID infection, in 2022, required prolonged nursing facility stating, now she is back home, has personal aide coming 9 AM to 6 PM, husband still go to work  She spent most of the time in wheelchair or lying down, feeling very depressed, poor appetite, sleepy, wants to follow-up for her stroke,  Over the past couple years, she also developed slow contraction of left elbow, left hands, especially left fifth fingers, neck pain, abnormal neck posturing, urinary incontinence    PHYSICAL EXAM:   There were no vitals filed for this visit.  Not recorded     There is no height or weight on file to calculate BMI.  PHYSICAL EXAMNIATION:  Gen: NAD, conversant, well nourised, well groomed                     Cardiovascular: Regular rate rhythm, no peripheral edema, warm, nontender. Eyes: Conjunctivae clear without exudates or hemorrhage Neck: Moderate right shoulder elevation, right turn, limited range of motion of neck, Pulmonary: Clear to auscultation bilaterally   NEUROLOGICAL EXAM:  MENTAL STATUS: Speech/cognition: Depressed looking unkept elderly female, cooperative on  examinations  CRANIAL NERVES: CN II: Left hemivisual field deficit,. Pupils are round equal and briskly reactive to light. CN III, IV, VI: extraocular movement are normal. No  ptosis. CN V: Facial sensation is intact to light touch CN VII: Face is symmetric with normal eye closure  CN VIII: Hearing is normal to causal conversation. CN IX, X: Phonation is normal. CN XI: Head turning and shoulder shrug are intact  MOTOR: Significant atrophy of left hand, antigravity movement of left upper extremity, weak grip on left hand, contraction of left elbow extended maximum 150 degree, right lower extremity has antigravity movement, barely antigravity movement of left lower extremity  REFLEXES: Brisk bilateral upper and lower extremity reflex, worsening on the left side, left Babinski sign,  SENSORY: Mild length-dependent sensory changes  COORDINATION: There is no trunk or limb dysmetria noted.  GAIT/STANCE: Deferred  REVIEW OF SYSTEMS:  Full 14 system review of systems performed and notable only for as above All other review of systems were negative.   ALLERGIES: Allergies  Allergen Reactions   Aleve [Naproxen] Nausea Only   Fire Ant (Solenopsis Invicta) Anaphylaxis   Covid-19 Mrna Vaccine (Pfizer) [Covid-19 Mrna Vacc (Moderna)]     Just the first round of moderna Covid vaccine, sluggish, uncoordinated, and weak, similar to flu.   Influenza Vac Split Quad     Fatigue, mimics a super bad flu    HOME MEDICATIONS: Current Outpatient Medications  Medication Sig Dispense Refill   acetaminophen  (TYLENOL ) 325 MG tablet Take 2 tablets (650 mg total) by mouth every 6 (six) hours as needed for mild pain or headache (fever >/= 101). 12 tablet 2   albuterol  (VENTOLIN  HFA) 108 (90 Base) MCG/ACT inhaler Inhale 2 puffs into the lungs every 6 (six) hours as needed for wheezing or shortness of breath. 17 each 2   alendronate  (FOSAMAX ) 70 MG tablet Take 1 tablet (70 mg total) by mouth every 7 (seven) days. Take with a full glass of water on an empty stomach. 12 tablet 3   ALPRAZolam  (XANAX ) 0.5 MG tablet Take 1 tablet (0.5 mg total) by mouth 2 (two) times daily as needed for  anxiety. TAKE 1 TABLET(0.5 MG) BY MOUTH TWICE DAILY AS NEEDED FOR ANXIETY Strength: 0.5 mg 60 tablet 2   ascorbic acid  (VITAMIN C) 500 MG tablet Take 1 tablet (500 mg total) by mouth daily. (Patient taking differently: Take 500 mg by mouth at bedtime.) 30 tablet 2   aspirin  EC 81 MG tablet Take 1 tablet (81 mg total) by mouth daily. Swallow whole. 30 tablet 12   Cholecalciferol (VITAMIN D -3 PO) Take 1 capsule by mouth at bedtime.     clopidogrel  (PLAVIX ) 75 MG tablet Take 1 tablet (75 mg total) by mouth daily. 90 tablet 3   divalproex  (DEPAKOTE ) 250 MG DR tablet Take 1 tablet by mouth See admin instructions. 1 tablet in the morning and 2 tablets every evening     folic acid  (FOLVITE ) 800 MCG tablet Take 400 mcg by mouth at bedtime.     isosorbide  mononitrate (IMDUR ) 30 MG 24 hr tablet Take 3 tablets (90 mg total) by mouth daily. 90 tablet 2   lactose free nutrition (BOOST) LIQD Take 237 mLs by mouth 2 (two) times daily between meals. At least one a day and sometimes more     levothyroxine  (SYNTHROID ) 75 MCG tablet Take 1 tablet (75 mcg total) by mouth daily. 90 tablet 1   linaclotide  (LINZESS ) 145 MCG CAPS capsule Take 1 capsule (145  mcg total) by mouth daily. 90 capsule 1   melatonin 5 MG TABS Take 5 mg by mouth at bedtime.     memantine  (NAMENDA ) 5 MG tablet Take 1 tablet (5 mg total) by mouth 2 (two) times daily. 180 tablet 3   metoprolol  tartrate (LOPRESSOR ) 25 MG tablet Take 0.5 tablets (12.5 mg total) by mouth 2 (two) times daily. 30 tablet 2   Multiple Vitamin (MULTIVITAMIN) tablet Take 1 tablet by mouth at bedtime.     nitroGLYCERIN  (NITROSTAT ) 0.4 MG SL tablet Place 1 tablet (0.4 mg total) under the tongue every 5 (five) minutes as needed for chest pain. 25 tablet 3   OVER THE COUNTER MEDICATION Take 1 tablet by mouth at bedtime. magnesium     pantoprazole  (PROTONIX ) 40 MG tablet Take 1 tablet (40 mg total) by mouth daily. 90 tablet 3   PARoxetine  (PAXIL ) 40 MG tablet Take 40 mg by mouth  every morning.     rosuvastatin  (CRESTOR ) 20 MG tablet Take 1 tablet (20 mg total) by mouth at bedtime. 30 tablet 2   No current facility-administered medications for this visit.    PAST MEDICAL HISTORY: Past Medical History:  Diagnosis Date   ANXIETY 02/12/2008   Qualifier: Diagnosis of  By: Norleen MD, Lynwood ORN    CVA (cerebral vascular accident) [facility])    DEPRESSION 02/12/2008   Qualifier: Diagnosis of  By: Helga Almarie Caldron    HYPERLIPIDEMIA 02/12/2008   Qualifier: Diagnosis of  By: Helga Almarie Caldron    HYPERTENSION 02/12/2008   Qualifier: Diagnosis of  By: Helga Almarie Caldron    HYPOTHYROIDISM 02/12/2008   Qualifier: Diagnosis of  By: Helga Almarie Caldron    Impaired glucose tolerance 08/27/2011   Left hemiparesis (HCC)    OSTEOPENIA 02/12/2008   Qualifier: Diagnosis of  By: Norleen MD, Lynwood ORN    VITAMIN D  DEFICIENCY 04/14/2010   Qualifier: Diagnosis of  By: Norleen MD, Lynwood ORN     PAST SURGICAL HISTORY: Past Surgical History:  Procedure Laterality Date   IR RADIOLOGIST EVAL & MGMT  08/18/2021   LEFT HEART CATH AND CORONARY ANGIOGRAPHY N/A 08/03/2023   Procedure: LEFT HEART CATH AND CORONARY ANGIOGRAPHY;  Surgeon: Wendel Lurena POUR, MD;  Location: [facility] INVASIVE CV LAB;  Service: Cardiovascular;  Laterality: N/A;    FAMILY HISTORY: Family History  Problem Relation Age of Onset   Heart disease Father    Bipolar disorder Sister    Diabetes Neg Hx     SOCIAL HISTORY: Social History   Socioeconomic History   Marital status: Married    Spouse name: Not on file   Number of children: 0   Years of education: Not on file   Highest education level: Not on file  Occupational History   Occupation: disabled  Tobacco Use   Smoking status: Former    Current packs/day: 0.00    Types: Cigarettes    Quit date: 01/26/2021    Years since quitting: 2.6   Smokeless tobacco: Never  Vaping Use   Vaping status: Never Used  Substance and Sexual Activity   Alcohol use:  Not Currently   Drug use: No   Sexual activity: Not on file  Other Topics Concern   Not on file  Social History Narrative   Not on file   Social Drivers of Health   Financial Resource Strain: Low Risk  (04/27/2023)   Overall Financial Resource Strain (CARDIA)    Difficulty of Paying Living Expenses: Not hard at all  Food Insecurity: No Food Insecurity (08/03/2023)   Hunger Vital Sign    Worried About Running Out of Food in the Last Year: Never true    Ran Out of Food in the Last Year: Never true  Transportation Needs: No Transportation Needs (08/03/2023)   PRAPARE - Administrator, Civil Service (Medical): No    Lack of Transportation (Non-Medical): No  Physical Activity: Inactive (04/27/2023)   Exercise Vital Sign    Days of Exercise per Week: 0 days    Minutes of Exercise per Session: 0 min  Stress: Stress Concern Present (04/27/2023)   [employer] of Occupational Health - Occupational Stress Questionnaire    Feeling of Stress : To some extent  Social Connections: Socially Isolated (04/27/2023)   Social Connection and Isolation Panel [NHANES]    Frequency of Communication with Friends and Family: Never    Frequency of Social Gatherings with Friends and Family: Never    Attends Religious Services: Never    Database Administrator or Organizations: No    Attends Banker Meetings: Never    Marital Status: Married  Catering Manager Violence: Not At Risk (08/03/2023)   Humiliation, Afraid, Rape, and Kick questionnaire    Fear of Current or Ex-Partner: No    Emotionally Abused: No    Physically Abused: No    Sexually Abused: No     I spent *** minutes of face-to-face and non-face-to-face time with patient.  This included previsit chart review, lab review, study review, order entry, electronic health record documentation, patient education and discussion regarding above diagnoses and treatment plan and answered all other questions to patient's  satisfaction  Harlene Bogaert, [facility]  [facility] [address] [place], [place] [address]  Phone [phone] Fax [phone] Note: This document was prepared with digital dictation and possible smart phrase technology. Any transcriptional errors that result from this process are unintentional.

## 2023-10-08 ENCOUNTER — Telehealth: Payer: Self-pay | Admitting: Neurology

## 2023-10-08 NOTE — Telephone Encounter (Signed)
 Called the pt in regard to apt for tomorrow am, there was no answer. LVM advising that due to the weather our office will be opening late and that we will need to reschedule the appt. Instructed the patient to call back after 10 am tomorrow for us  to get rescheduled.

## 2023-10-09 ENCOUNTER — Ambulatory Visit: Payer: Medicare HMO | Admitting: Adult Health

## 2023-10-11 ENCOUNTER — Ambulatory Visit: Payer: Medicare HMO | Admitting: Internal Medicine

## 2023-10-12 ENCOUNTER — Ambulatory Visit: Payer: Self-pay

## 2023-10-12 NOTE — Patient Outreach (Signed)
  Care Coordination   Follow Up Visit Note   10/12/2023 Name: Susan Davidson MRN: [id number] DOB: 01-28-1951  Susan Davidson is a 73 y.o. year old female who sees Norleen Lynwood ORN, MD for primary care. I spoke with Oneil Neal(spouse/caregiver) by phone today.  What matters to the patients health and wellness today?  Mr. Duffy reports no changes or worsening of patient. He states GI appointment completed on 10/03/23. Reports patient is taking medications for constipation and is having bowel movements. She continues to have in home care through [facility] daily to allow him to go to work. Palliative care continues to follow patient. Mr. Lipsky reports he will take patient to Palliative care appointment in February. He denies any additional social work needs or resource needs at this time. He has rescheduled patient's neurology appointment for 10/26/23 and will discuss patient's sleeping pattern with neurologist at that visit.  Goals Addressed             This Visit's Progress    COMPLETED: Care Coordination Activities       Interventions Today    Flowsheet Row Most Recent Value  Chronic Disease   Chronic disease during today's visit Other, Chronic Kidney Disease/End Stage Renal Disease (ESRD), Hypertension (HTN)  [dementia]  General Interventions   General Interventions Discussed/Reviewed General Interventions Reviewed, Doctor Visits, [employer] of current treatment plan for health condition and patient's adherence to plan. Provided contact to [facility] [phone])]  Doctor Visits Discussed/Reviewed PCP, Specialist  PCP/Specialist Visits Compliance with follow-up visit  [reviewed upcoming visits.]  Education Interventions   Education Provided Provided Education  Provided Verbal Education On Nutrition, When to see the doctor  [advised attend provider visits as scheduled, reach back out to palliaitive care nurse as needed, discussed caregiver stress and  availability of SW to assist as needed]  Nutrition Interventions   Nutrition Discussed/Reviewed Nutrition Reviewed  Safety Interventions   Safety Discussed/Reviewed Safety Reviewed  Advanced Directive Interventions   End of Life Palliative           Patient support/Assist with health management       Interventions Today    Flowsheet Row Most Recent Value  Chronic Disease   Chronic disease during today's visit Other  General Interventions   General Interventions Discussed/Reviewed General Interventions Reviewed, Doctor Visits  [Evaluation of current treatment plan for health condition and patient's adherence to plan. discussed clinical social worker available to assist for social work needs.]  Doctor Visits Discussed/Reviewed PCP, Specialist  PCP/Specialist Visits Compliance with follow-up visit  [reviewed upcoming follow up appointments.]  Education Interventions   Education Provided Provided Education  Provided Verbal Education On Medication, Mental Health/Coping with Illness, Nutrition, When to see the doctor  Nutrition Interventions   Nutrition Discussed/Reviewed Nutrition Reviewed  Pharmacy Interventions   Pharmacy Dicussed/Reviewed Pharmacy Topics Reviewed  Safety Interventions   Safety Discussed/Reviewed Home Safety, Safety Reviewed            SDOH assessments and interventions completed:  No  Care Coordination Interventions:  Yes, provided   Follow up plan: Follow up call scheduled for 12/07/23    Encounter Outcome:  Patient Visit Completed   Heddy Shutter, RN, MSN, BSN, CCM Care Management Coordinator [phone]

## 2023-10-12 NOTE — Patient Instructions (Signed)
 Visit Information  Thank you for taking time to visit with me today. Please don't hesitate to contact me if I can be of assistance to you.   Following are the goals we discussed today:  Continue to take medications as prescribed. Continue to attend provider visits as scheduled Continue to eat healthy, lean meats, vegetables, fruits, avoid saturated and transfats Contact provider with health questions or concerns as needed  Our next appointment is by telephone on 12/07/23 at 9:00 am  Please call the care guide team at [phone] if you need to cancel or reschedule your appointment.   If you are experiencing a Mental Health or Behavioral Health Crisis or need someone to talk to, please call the Suicide and Crisis Lifeline: 988 call the [place]  National Suicide Prevention Lifeline: 4630418152 or TTY: 631 230 7937 TTY (361) [phone]) to talk to a trained counselor   Heddy Shutter, RN, MSN, BSN, CCM Care Management Coordinator [phone]

## 2023-10-13 DIAGNOSIS — D539 Nutritional anemia, unspecified: Secondary | ICD-10-CM | POA: Diagnosis not present

## 2023-10-16 ENCOUNTER — Other Ambulatory Visit: Payer: Self-pay | Admitting: *Deleted

## 2023-10-16 DIAGNOSIS — E890 Postprocedural hypothyroidism: Secondary | ICD-10-CM

## 2023-10-17 ENCOUNTER — Ambulatory Visit: Payer: Medicare HMO | Admitting: Nurse Practitioner

## 2023-10-18 LAB — FOLATE

## 2023-10-18 LAB — CBC
Hematocrit: 44.8 % (ref 34.0–46.6)
Hemoglobin: 14.6 g/dL (ref 11.1–15.9)
MCH: 34 pg — ABNORMAL HIGH (ref 26.6–33.0)
MCHC: 32.6 g/dL (ref 31.5–35.7)
MCV: 104 fL — ABNORMAL HIGH (ref 79–97)
Platelets: 208 10*3/uL (ref 150–450)
RBC: 4.3 x10E6/uL (ref 3.77–5.28)
RDW: 13.5 % (ref 11.7–15.4)
WBC: 6.7 10*3/uL (ref 3.4–10.8)

## 2023-10-18 LAB — IRON,TIBC AND FERRITIN PANEL
Ferritin: 157 ng/mL — ABNORMAL HIGH (ref 15–150)
Iron Saturation: 32 % (ref 15–55)
Iron: 85 ug/dL (ref 27–139)
Total Iron Binding Capacity: 264 ug/dL (ref 250–450)
UIBC: 179 ug/dL (ref 118–369)

## 2023-10-24 ENCOUNTER — Telehealth: Payer: Self-pay | Admitting: Adult Health

## 2023-10-24 NOTE — Telephone Encounter (Signed)
LVM's on both numbers in pt's chart to inform pt of need to reschedule 10/26/23 appt - NP out

## 2023-10-26 ENCOUNTER — Ambulatory Visit: Payer: Medicare HMO | Admitting: Adult Health

## 2023-10-26 NOTE — Progress Notes (Deleted)
 Cardiology Office Note:    Date:  10/26/2023   ID:  Susan Davidson, DOB 04/02/1951, MRN [id number]  PCP:  Corwin Levins, MD  Cardiologist:  Christell Constant, MD { Click to update primary MD,subspecialty MD or APP then REFRESH:1}    Referring MD: Corwin Levins, MD   Chief Complaint: follow-up of CAD  History of Present Illness:    Susan Davidson is a 73 y.o. female with a history of CAD with NSTEMI in 07/2023 (treated medically), CVA in 01/2021, cerebral aneurysm (5mm ACA aneurysm noted on brain MIR in 05/2023), hypertension, hyperlipidemia, CKD stage IIIa, and severe dementia who is followed by Dr. Izora Ribas and presents today for follow-up of CAD.  Patient was first seen by Cardiology during and admission in 07/2023 for NSTEMI.  Echo showed LVEF of 60-65% with no regional wall motion abnormalities and grade 1 diastolic dysfunction. LHC showed 95% stenosis of mid RCA with otherwise only minimal disease. Medical therapy was recommend given tortuosity and calcification of vessel. He was started on DAPT with Aspirin and Plavix.  She did develop a large hematoma of her right arm following cardiac catheterization that improved with compression. Arterial ultrasound showed no evidence of pseudoaneurysm or AVF. She was seen by Vascular Surgery and conservative management was recommended.   She was last seen by me in 08/2023 at which she was doing well from a cardiac standpoint with no chest pain or shortness of breath.   Patient presents today for follow-up. ***  CAD History of NSTEMI in 07/2023. LHC showed 95% stenosis of mid RCA with otherwise only minimal disease. Medical therapy was recommend given tortuosity and calcification of vessel.  - No chest pain. - Continue Imdur 90mg  daily and Lopressor 12.5mg  twice daily.  - Continue DAPT with Aspirin and Plavix.  - Continue high-intensity statin.    Hypertension BP well controlled. *** - Continue Imdur 90mg  daily.  - Continue  Lopressor 12.5mg  twice dialy.    Hyperlipidemia Lipid panel in 07/2023: Total Cholesterol 146, Triglycerides 92, HDL 39, LDL 89. LDL goal <55 given history of MI and stroke.  - Crestor was increased to 20mg  daily during admission in 07/2023 for NSTEMI. Continue.  - Will repeat lipid panel and LFTs. ***   CKD Stage IIIa Baseline creatinine around 0.7 to 1.1. Stable at 0.80 in 08/2023.   EKGs/Labs/Other Studies Reviewed:    The following studies were reviewed:  Echocardiogram 08/03/2023: Impressions: 1. Left ventricular ejection fraction, by estimation, is 60 to 65%. The  left ventricle has normal function. The left ventricle has no regional  wall motion abnormalities. Left ventricular diastolic parameters are  consistent with Grade I diastolic  dysfunction (impaired relaxation).   2. Right ventricular systolic function is normal. The right ventricular  size is normal. Tricuspid regurgitation signal is inadequate for assessing  PA pressure.   3. A small pericardial effusion is present.   4. The mitral valve is normal in structure. No evidence of mitral valve  regurgitation.   5. The aortic valve was not well visualized. Aortic valve regurgitation  is not visualized.   6. Aortic not well visualized.   7. The inferior vena cava is normal in size with greater than 50%  respiratory variability, suggesting right atrial pressure of 3 mmHg.  _______________   Left Cardiac Catheterization 08/03/2023:   Mid RCA lesion is 95% stenosed.   1.  High-grade mid right coronary artery lesion.  After review with Dr. Izora Ribas and given its  tortuosity and calcification in the context of the patient's comorbidities we have elected to pursue medical therapy.  If the patient is anginal symptoms cannot be controlled PCI can be considered. 2.  Mild disease of small caliber LAD and left circumflex. 3.  LVEDP of 10 mmHg:   Summary: Medical therapy for acute coronary syndrome.  A Plavix load was  ordered.  If the patient's chest pain cannot be controlled with medical therapy PCI can be considered.   Diagnostic Dominance: Right     EKG:  EKG not ordered today.   Recent Labs: 07/14/2023: TSH 3.810 08/03/2023: ALT 14; B Natriuretic Peptide 186.7; Magnesium 2.3 08/17/2023: BUN 22; Creatinine, Ser 0.80; Potassium 4.8; Sodium 144 10/13/2023: Hemoglobin 14.6; Platelets 208  Recent Lipid Panel    Component Value Date/Time   CHOL 146 08/03/2023 1813   TRIG 92 08/03/2023 1813   HDL 39 (L) 08/03/2023 1813   CHOLHDL 3.7 08/03/2023 1813   VLDL 18 08/03/2023 1813   LDLCALC 89 08/03/2023 1813   LDLDIRECT 137.5 10/19/2012 1106    Physical Exam:    Vital Signs: There were no vitals taken for this visit.    Wt Readings from Last 3 Encounters:  08/03/23 138 lb 7.2 oz (62.8 kg)  07/31/23 140 lb (63.5 kg)  06/16/23 140 lb (63.5 kg)     General: 73 y.o. female in no acute distress. HEENT: Normocephalic and atraumatic. Sclera clear.  Neck: Supple. No carotid bruits. No JVD. Heart: *** RRR. Distinct S1 and S2. No murmurs, gallops, or rubs.  Lungs: No increased work of breathing. Clear to ausculation bilaterally. No wheezes, rhonchi, or rales.  Abdomen: Soft, non-distended, and non-tender to palpation.  Extremities: No lower extremity edema.  Radial and distal pedal pulses 2+ and equal bilaterally. Skin: Warm and dry. Neuro: No focal deficits. Psych: Normal affect. Responds appropriately.   Assessment:    No diagnosis found.  Plan:     Disposition: Follow up in ***   Signed, Corrin Parker, PA-C  10/26/2023 2:00 PM    [facility]

## 2023-11-01 ENCOUNTER — Telehealth: Payer: Self-pay | Admitting: *Deleted

## 2023-11-01 DIAGNOSIS — E890 Postprocedural hypothyroidism: Secondary | ICD-10-CM | POA: Diagnosis not present

## 2023-11-01 NOTE — Telephone Encounter (Signed)
Patient's husband left a message that his wife has appointment in March. It is a morning appointment and there is a transportation issue in the mornings. He is asking in the message if her appointment could be moved to a afternoon between 3 pm or later? He is requesting a call back. Patient has had her lab work done today.

## 2023-11-02 LAB — T4, FREE: Free T4: 1.47 ng/dL (ref 0.82–1.77)

## 2023-11-02 LAB — TSH: TSH: 3.44 u[IU]/mL (ref 0.450–4.500)

## 2023-11-02 NOTE — Telephone Encounter (Signed)
I called pt's husband, he did not answer, I moved it to 3:30 pm if he calls back

## 2023-11-02 NOTE — Telephone Encounter (Signed)
Yes, you can overbook

## 2023-11-06 ENCOUNTER — Ambulatory Visit: Payer: Medicare HMO | Admitting: Student

## 2023-11-10 NOTE — Progress Notes (Deleted)
 Cardiology Office Note:    Date:  11/10/2023   ID:  Susan Davidson, DOB Apr 04, 1951, MRN [id number]  PCP:  Corwin Levins, MD  Cardiologist:  Christell Constant, MD { Click to update primary MD,subspecialty MD or APP then REFRESH:1}    Referring MD: Corwin Levins, MD   Chief Complaint: follow-up of CAD  History of Present Illness:    Susan Davidson is a 73 y.o. female with a history of CAD with NSTEMI in 07/2023 (treated medically), CVA in 01/2021, cerebral aneurysm (5mm ACA aneurysm noted on brain MIR in 05/2023), hypertension, hyperlipidemia, CKD stage IIIa, and severe dementia who is followed by Dr. Izora Ribas and presents today for follow-up of CAD.  Patient was first seen by Cardiology during and admission in 07/2023 for NSTEMI.  Echo showed LVEF of 60-65% with no regional wall motion abnormalities and grade 1 diastolic dysfunction. LHC showed 95% stenosis of mid RCA with otherwise only minimal disease. Medical therapy was recommend given tortuosity and calcification of vessel. He was started on DAPT with Aspirin and Plavix.  She did develop a large hematoma of her right arm following cardiac catheterization that improved with compression. Arterial ultrasound showed no evidence of pseudoaneurysm or AVF. She was seen by Vascular Surgery and conservative management was recommended.   She was last seen by me in 08/2023 at which she was doing well from a cardiac standpoint with no chest pain or shortness of breath.   Patient presents today for follow-up. ***  CAD History of NSTEMI in 07/2023. LHC showed 95% stenosis of mid RCA with otherwise only minimal disease. Medical therapy was recommend given tortuosity and calcification of vessel.  - No chest pain. - Continue Imdur 90mg  daily and Lopressor 12.5mg  twice daily.  - Continue DAPT with Aspirin and Plavix.  - Continue high-intensity statin.    Hypertension BP well controlled. *** - Continue Imdur 90mg  daily.  - Continue  Lopressor 12.5mg  twice dialy.    Hyperlipidemia Lipid panel in 07/2023: Total Cholesterol 146, Triglycerides 92, HDL 39, LDL 89. LDL goal <55 given history of MI and stroke.  - Crestor was increased to 20mg  daily during admission in 07/2023 for NSTEMI. Continue.  - Will repeat lipid panel and LFTs. ***   CKD Stage IIIa Baseline creatinine around 0.7 to 1.1. Stable at 0.80 in 08/2023.   EKGs/Labs/Other Studies Reviewed:    The following studies were reviewed:  Echocardiogram 08/03/2023: Impressions: 1. Left ventricular ejection fraction, by estimation, is 60 to 65%. The  left ventricle has normal function. The left ventricle has no regional  wall motion abnormalities. Left ventricular diastolic parameters are  consistent with Grade I diastolic  dysfunction (impaired relaxation).   2. Right ventricular systolic function is normal. The right ventricular  size is normal. Tricuspid regurgitation signal is inadequate for assessing  PA pressure.   3. A small pericardial effusion is present.   4. The mitral valve is normal in structure. No evidence of mitral valve  regurgitation.   5. The aortic valve was not well visualized. Aortic valve regurgitation  is not visualized.   6. Aortic not well visualized.   7. The inferior vena cava is normal in size with greater than 50%  respiratory variability, suggesting right atrial pressure of 3 mmHg.  _______________   Left Cardiac Catheterization 08/03/2023:   Mid RCA lesion is 95% stenosed.   1.  High-grade mid right coronary artery lesion.  After review with Dr. Izora Ribas and given its  tortuosity and calcification in the context of the patient's comorbidities we have elected to pursue medical therapy.  If the patient is anginal symptoms cannot be controlled PCI can be considered. 2.  Mild disease of small caliber LAD and left circumflex. 3.  LVEDP of 10 mmHg:   Summary: Medical therapy for acute coronary syndrome.  A Plavix load was  ordered.  If the patient's chest pain cannot be controlled with medical therapy PCI can be considered.   Diagnostic Dominance: Right     EKG:  EKG not ordered today.   Recent Labs: 08/03/2023: ALT 14; B Natriuretic Peptide 186.7; Magnesium 2.3 08/17/2023: BUN 22; Creatinine, Ser 0.80; Potassium 4.8; Sodium 144 10/13/2023: Hemoglobin 14.6; Platelets 208 11/01/2023: TSH 3.440  Recent Lipid Panel    Component Value Date/Time   CHOL 146 08/03/2023 1813   TRIG 92 08/03/2023 1813   HDL 39 (L) 08/03/2023 1813   CHOLHDL 3.7 08/03/2023 1813   VLDL 18 08/03/2023 1813   LDLCALC 89 08/03/2023 1813   LDLDIRECT 137.5 10/19/2012 1106    Physical Exam:    Vital Signs: There were no vitals taken for this visit.    Wt Readings from Last 3 Encounters:  08/03/23 138 lb 7.2 oz (62.8 kg)  07/31/23 140 lb (63.5 kg)  06/16/23 140 lb (63.5 kg)     General: 73 y.o. female in no acute distress. HEENT: Normocephalic and atraumatic. Sclera clear.  Neck: Supple. No carotid bruits. No JVD. Heart: *** RRR. Distinct S1 and S2. No murmurs, gallops, or rubs.  Lungs: No increased work of breathing. Clear to ausculation bilaterally. No wheezes, rhonchi, or rales.  Abdomen: Soft, non-distended, and non-tender to palpation.  Extremities: No lower extremity edema.  Radial and distal pedal pulses 2+ and equal bilaterally. Skin: Warm and dry. Neuro: No focal deficits. Psych: Normal affect. Responds appropriately.   Assessment:    No diagnosis found.  Plan:     Disposition: Follow up in ***   Signed, Joni Reining, NP  11/10/2023 12:38 PM    [facility]

## 2023-11-13 ENCOUNTER — Ambulatory Visit: Payer: Medicare HMO | Admitting: Adult Health

## 2023-11-13 DIAGNOSIS — I69354 Hemiplegia and hemiparesis following cerebral infarction affecting left non-dominant side: Secondary | ICD-10-CM | POA: Diagnosis not present

## 2023-11-13 DIAGNOSIS — Z515 Encounter for palliative care: Secondary | ICD-10-CM | POA: Diagnosis not present

## 2023-11-13 DIAGNOSIS — I639 Cerebral infarction, unspecified: Secondary | ICD-10-CM | POA: Diagnosis not present

## 2023-11-24 ENCOUNTER — Ambulatory Visit: Payer: Medicare HMO | Admitting: Podiatry

## 2023-11-27 ENCOUNTER — Ambulatory Visit (INDEPENDENT_AMBULATORY_CARE_PROVIDER_SITE_OTHER): Payer: Medicare HMO | Admitting: Podiatry

## 2023-11-27 ENCOUNTER — Encounter: Payer: Self-pay | Admitting: Podiatry

## 2023-11-27 DIAGNOSIS — B351 Tinea unguium: Secondary | ICD-10-CM

## 2023-11-27 DIAGNOSIS — N1831 Chronic kidney disease, stage 3a: Secondary | ICD-10-CM

## 2023-11-27 DIAGNOSIS — M79675 Pain in left toe(s): Secondary | ICD-10-CM

## 2023-11-27 DIAGNOSIS — M79674 Pain in right toe(s): Secondary | ICD-10-CM

## 2023-11-27 NOTE — Progress Notes (Signed)
This patient presents to the office with chief complaint of long thick painful nails.  Patient says the nails are painful walking and wearing shoes.  This patient is unable to self treat.  This patient is unable to trim her nails since she is unable to reach her nails.  She present to the office in a wheelchair accompanied by female caregiver. She presents to the office for preventative foot care services.  General Appearance  Alert, conversant and in no acute stress.  Vascular  Dorsalis pedis and posterior tibial  pulses are weakly  palpable  bilaterally.  Capillary return is within normal limits  bilaterally. Temperature is within normal limits  bilaterally. Purplish feet  B/L.  Neurologic  Senn-Weinstein monofilament wire test within normal limits  bilaterally. Muscle power within normal limits bilaterally.  Nails Thick disfigured discolored nails with subungual debris  from hallux to fifth toes bilaterally. No evidence of bacterial infection or drainage bilaterally.  Orthopedic  No limitations of motion  feet .  No crepitus or effusions noted.  No bony pathology or digital deformities noted.  Skin  normotropic skin with no porokeratosis noted bilaterally.  No signs of infections or ulcers noted.     Onychomycosis  Nails  B/L.  Pain in right toes  Pain in left toes  Debridement of nails both feet followed trimming the nails with dremel tool.    RTC 4  months.   Colter Magowan DPM   

## 2023-12-04 DIAGNOSIS — I69354 Hemiplegia and hemiparesis following cerebral infarction affecting left non-dominant side: Secondary | ICD-10-CM | POA: Diagnosis not present

## 2023-12-04 DIAGNOSIS — I639 Cerebral infarction, unspecified: Secondary | ICD-10-CM | POA: Diagnosis not present

## 2023-12-04 DIAGNOSIS — Z515 Encounter for palliative care: Secondary | ICD-10-CM | POA: Diagnosis not present

## 2023-12-05 DIAGNOSIS — F3181 Bipolar II disorder: Secondary | ICD-10-CM | POA: Diagnosis not present

## 2023-12-05 DIAGNOSIS — F429 Obsessive-compulsive disorder, unspecified: Secondary | ICD-10-CM | POA: Diagnosis not present

## 2023-12-06 ENCOUNTER — Ambulatory Visit: Payer: Medicare HMO | Admitting: Nurse Practitioner

## 2023-12-06 ENCOUNTER — Encounter: Payer: Self-pay | Admitting: Nurse Practitioner

## 2023-12-06 ENCOUNTER — Ambulatory Visit (INDEPENDENT_AMBULATORY_CARE_PROVIDER_SITE_OTHER): Payer: Medicare HMO | Admitting: Nurse Practitioner

## 2023-12-06 VITALS — BP 106/66 | HR 59 | Ht 62.0 in | Wt 140.0 lb

## 2023-12-06 DIAGNOSIS — E890 Postprocedural hypothyroidism: Secondary | ICD-10-CM

## 2023-12-06 MED ORDER — LEVOTHYROXINE SODIUM 75 MCG PO TABS
75.0000 ug | ORAL_TABLET | Freq: Every day | ORAL | 1 refills | Status: DC
Start: 2023-12-06 — End: 2024-02-02

## 2023-12-06 NOTE — Progress Notes (Signed)
 Endocrinology Follow Up Note                                         12/06/2023, 4:06 PM  Subjective:   Subjective    Susan Davidson is a 73 y.o.-year-old female patient being seen in follow up after being seen in consultation for hypothyroidism referred by Corwin Levins, MD.   Past Medical History:  Diagnosis Date   ANXIETY 02/12/2008   Qualifier: Diagnosis of  By: Jonny Ruiz MD, Len Blalock    CVA (cerebral vascular accident) [facility])    DEPRESSION 02/12/2008   Qualifier: Diagnosis of  By: Maris Berger    HYPERLIPIDEMIA 02/12/2008   Qualifier: Diagnosis of  By: Maris Berger    HYPERTENSION 02/12/2008   Qualifier: Diagnosis of  By: Maris Berger    HYPOTHYROIDISM 02/12/2008   Qualifier: Diagnosis of  By: Maris Berger    Impaired glucose tolerance 08/27/2011   Left hemiparesis (HCC)    OSTEOPENIA 02/12/2008   Qualifier: Diagnosis of  By: Jonny Ruiz MD, Len Blalock    VITAMIN D DEFICIENCY 04/14/2010   Qualifier: Diagnosis of  By: Jonny Ruiz MD, Len Blalock     Past Surgical History:  Procedure Laterality Date   IR RADIOLOGIST EVAL & MGMT  08/18/2021   LEFT HEART CATH AND CORONARY ANGIOGRAPHY N/A 08/03/2023   Procedure: LEFT HEART CATH AND CORONARY ANGIOGRAPHY;  Surgeon: Orbie Pyo, MD;  Location: [facility] INVASIVE [facility];  Service: Cardiovascular;  Laterality: N/A;    Social History   Socioeconomic History   Marital status: Married    Spouse name: Not on file   Number of children: 0   Years of education: Not on file   Highest education level: Not on file  Occupational History   Occupation: disabled  Tobacco Use   Smoking status: Former    Current packs/day: 0.00    Types: Cigarettes    Quit date: 01/26/2021    Years since quitting: 2.8   Smokeless tobacco: Never  Vaping Use   Vaping status: Never Used  Substance and Sexual Activity   Alcohol use: Not Currently   Drug use: No    Sexual activity: Not on file  Other Topics Concern   Not on file  Social History Narrative   Not on file   Social Drivers of Health   Financial Resource Strain: Low Risk  (04/27/2023)   Overall Financial Resource Strain (CARDIA)    Difficulty of Paying Living Expenses: Not hard at all  Food Insecurity: No Food Insecurity (08/03/2023)   Hunger Vital Sign    Worried About Running Out of Food in the Last Year: Never true    Ran Out of Food in the Last Year: Never true  Transportation Needs: No Transportation Needs (08/03/2023)   PRAPARE - Administrator, Civil Service (Medical): No    Lack of Transportation (Non-Medical): No  Physical Activity: Inactive (04/27/2023)   Exercise Vital Sign    Days of Exercise  per Week: 0 days    Minutes of Exercise per Session: 0 min  Stress: Stress Concern Present (04/27/2023)   [employer] of Occupational Health - Occupational Stress Questionnaire    Feeling of Stress : To some extent  Social Connections: Socially Isolated (04/27/2023)   Social Connection and Isolation Panel [NHANES]    Frequency of Communication with Friends and Family: Never    Frequency of Social Gatherings with Friends and Family: Never    Attends Religious Services: Never    Database administrator or Organizations: No    Attends Engineer, structural: Never    Marital Status: Married    Family History  Problem Relation Age of Onset   Heart disease Father    Bipolar disorder Sister    Diabetes Neg Hx     Outpatient Encounter Medications as of 12/06/2023  Medication Sig   acetaminophen (TYLENOL) 325 MG tablet Take 2 tablets (650 mg total) by mouth every 6 (six) hours as needed for mild pain or headache (fever >/= 101).   albuterol (VENTOLIN HFA) 108 (90 Base) MCG/ACT inhaler Inhale 2 puffs into the lungs every 6 (six) hours as needed for wheezing or shortness of breath.   alendronate (FOSAMAX) 70 MG tablet Take 1 tablet (70 mg total) by mouth every 7  (seven) days. Take with a full glass of water on an empty stomach.   ALPRAZolam (XANAX) 0.5 MG tablet Take 1 tablet (0.5 mg total) by mouth 2 (two) times daily as needed for anxiety. TAKE 1 TABLET(0.5 MG) BY MOUTH TWICE DAILY AS NEEDED FOR ANXIETY Strength: 0.5 mg   ascorbic acid (VITAMIN C) 500 MG tablet Take 1 tablet (500 mg total) by mouth daily. (Patient taking differently: Take 500 mg by mouth at bedtime.)   aspirin EC 81 MG tablet Take 1 tablet (81 mg total) by mouth daily. Swallow whole.   Cholecalciferol (VITAMIN D-3 PO) Take 1 capsule by mouth at bedtime.   clopidogrel (PLAVIX) 75 MG tablet Take 1 tablet (75 mg total) by mouth daily.   divalproex (DEPAKOTE) 250 MG DR tablet Take 1 tablet by mouth See admin instructions. 1 tablet in the morning and 2 tablets every evening   folic acid (FOLVITE) 800 MCG tablet Take 400 mcg by mouth at bedtime.   lactose free nutrition (BOOST) LIQD Take 237 mLs by mouth 2 (two) times daily between meals. At least one a day and sometimes more   linaclotide (LINZESS) 145 MCG CAPS capsule Take 1 capsule (145 mcg total) by mouth daily.   melatonin 5 MG TABS Take 5 mg by mouth at bedtime.   memantine (NAMENDA) 5 MG tablet Take 1 tablet (5 mg total) by mouth 2 (two) times daily.   Multiple Vitamin (MULTIVITAMIN) tablet Take 1 tablet by mouth at bedtime.   nitroGLYCERIN (NITROSTAT) 0.4 MG SL tablet Place 1 tablet (0.4 mg total) under the tongue every 5 (five) minutes as needed for chest pain.   OVER THE COUNTER MEDICATION Take 1 tablet by mouth at bedtime. magnesium   pantoprazole (PROTONIX) 40 MG tablet Take 1 tablet (40 mg total) by mouth daily.   PARoxetine (PAXIL) 40 MG tablet Take 40 mg by mouth every morning.   QUEtiapine (SEROQUEL) 25 MG tablet Take 25 mg by mouth at bedtime.   rosuvastatin (CRESTOR) 20 MG tablet Take 1 tablet (20 mg total) by mouth at bedtime.   [DISCONTINUED] levothyroxine (SYNTHROID) 75 MCG tablet Take 1 tablet (75 mcg total) by mouth  daily.  isosorbide mononitrate (IMDUR) 30 MG 24 hr tablet Take 3 tablets (90 mg total) by mouth daily.   levothyroxine (SYNTHROID) 75 MCG tablet Take 1 tablet (75 mcg total) by mouth daily.   metoprolol tartrate (LOPRESSOR) 25 MG tablet Take 0.5 tablets (12.5 mg total) by mouth 2 (two) times daily.   No facility-administered encounter medications on file as of 12/06/2023.    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   Influenza Vac Split Quad     Fatigue, mimics a "super bad flu"   VACCINATION STATUS: Immunization History  Administered Date(s) Administered   Influenza-Unspecified 08/18/2021   Moderna Sars-Covid-2 Vaccination 12/02/2019   PNEUMOCOCCAL CONJUGATE-20 05/29/2023   Tdap 10/19/2012     HPI   LESSLIE MOSSA was diagnosed with hyperthyroidism at approximate age of 20 years, which required RAI ablation and subsequent initiation of thyroid hormone replacement. she was given various doses of Levothyroxine over the years, currently on 75 mcg po daily before breakfast.  She has been taking this medication consistently.  Her previsit thyroid function tests are consistent with appropriate replacement.  She has no new complaints.  She remains wheelchair-bound. She is living at home, had previously been in/out of rehab facilitys in the past.  Pt denies feeling nodules in neck, hoarseness, dysphagia/odynophagia, SOB with lying down.  she denies family history of thyroid disorders.  No family history of thyroid cancer.   No recent use of iodine supplements.  Denies use of Biotin containing supplements.  She has never had any previous imaging of her thyroid in the past.  I reviewed her chart and she also has a history of CVA, encephalopathy, CKD,  depression, HLD, Osteopenia, vitamin D deficiency.   Review of systems  Constitutional: + Minimally fluctuating body weight,  current Body mass index is 25.61 kg/m. , no fatigue, no subjective hyperthermia, + subjective hypothermia Eyes: no blurry vision, no xerophthalmia ENT: no sore throat, no nodules palpated in throat, no dysphagia/odynophagia, no hoarseness Cardiovascular: no chest pain, no shortness of breath, no palpitations, no leg swelling Respiratory: no cough, no shortness of breath Gastrointestinal: no nausea/vomiting/diarrhea Musculoskeletal: no muscle/joint aches, WC bound from previous stroke Skin: no rashes, no hyperemia Neurological: no tremors, no numbness, no tingling, no dizziness Psychiatric: no depression, no anxiety   Objective:   Objective     BP 106/66 (BP Location: Left Arm, Patient Position: Sitting, Cuff Size: Large)   Pulse (!) 59   Ht 5\' 2"  (1.575 m)   Wt 140 lb (63.5 kg) Comment: Patient is in wheelchair, patient's husband reports that her weight is as noted.  BMI 25.61 kg/m  Wt Readings from Last 3 Encounters:  12/06/23 140 lb (63.5 kg)  08/03/23 138 lb 7.2 oz (62.8 kg)  07/31/23 140 lb (63.5 kg)    BP Readings from Last 3 Encounters:  12/06/23 106/66  10/03/23 (!) 106/59  10/02/23 126/72      Physical Exam- Limited  Constitutional:  Body mass index is 25.61 kg/m. , not in acute distress, normal state of mind, pale complexion Musculoskeletal: no gross deformities, WC bound after past CVA    CMP ( most recent) CMP     Component Value Date/Time   NA 144 08/17/2023 1537   K 4.8 08/17/2023 1537  CL 106 08/17/2023 1537   CO2 23 08/17/2023 1537   GLUCOSE 105 (H) 08/17/2023 1537   GLUCOSE 111 (H) 08/05/2023 0349   BUN 22 08/17/2023 1537   CREATININE 0.80 08/17/2023 1537   CALCIUM 9.6 08/17/2023 1537   PROT 6.2 (L) 08/03/2023 0617   ALBUMIN 3.1 (L) 08/03/2023 0617   AST 40 08/03/2023 0617   ALT 14 08/03/2023 0617   ALKPHOS 38  08/03/2023 0617   BILITOT 0.8 08/03/2023 0617   GFRNONAA 48 (L) 08/05/2023 0349   GFRAA 73 02/12/2008 1525     Diabetic Labs (most recent): Lab Results  Component Value Date   HGBA1C 6.3 04/10/2023   HGBA1C 6.1 01/06/2022   HGBA1C 6.1 10/29/2021     Lipid Panel ( most recent) Lipid Panel     Component Value Date/Time   CHOL 146 08/03/2023 1813   TRIG 92 08/03/2023 1813   HDL 39 (L) 08/03/2023 1813   CHOLHDL 3.7 08/03/2023 1813   VLDL 18 08/03/2023 1813   LDLCALC 89 08/03/2023 1813   LDLDIRECT 137.5 10/19/2012 1106       Lab Results  Component Value Date   TSH 3.440 11/01/2023   TSH 3.810 07/14/2023   TSH 0.839 05/17/2023   TSH 4.86 04/10/2023   TSH 4.010 12/13/2022   TSH 2.270 05/30/2022   TSH 1.348 01/18/2022   TSH 1.38 01/06/2022   TSH 2.01 10/29/2021   TSH 1.402 08/03/2021   FREET4 1.47 11/01/2023   FREET4 1.45 07/14/2023   FREET4 1.95 (H) 05/17/2023   FREET4 1.30 12/13/2022   FREET4 1.48 05/30/2022   FREET4 0.66 01/28/2021   FREET4 0.38 (L) 11/09/2020     Latest Reference Range & Units 05/30/22 11:06 12/13/22 15:43 04/10/23 10:22 05/17/23 11:46 07/14/23 10:43 11/01/23 10:16  TSH 0.450 - 4.500 uIU/mL 2.270 4.010 4.86 0.839 3.810 3.440  T4,Free(Direct) 0.82 - 1.77 ng/dL 5.78 4.69  6.29 (H) 5.28 1.47  (H): Data is abnormally high   Assessment & Plan:   ASSESSMENT / PLAN:  1. Hypothyroidism-s/p RAI ablation for hyperthyroidism many years ago   Patient with long-standing hypothyroidism, on Levothyroxine therapy. On physical exam, patient does not have gross goiter, thyroid nodules, or neck compression symptoms.     -Her TFTs are consistent with appropriate hormone replacement.  She is advised to continue Levothyroxine 75 mcg po daily before breakfast.   Will plan for her to have TFTs prior to next visit and adjust dose accordingly.   - We discussed about the correct intake of her thyroid hormone, on empty stomach at fasting, with water, separated by at  least 30 minutes from breakfast and other medications,  and separated by more than 4 hours from calcium, iron, multivitamins, acid reflux medications (PPIs). -Patient is made aware of the fact that thyroid hormone replacement is needed for life, dose to be adjusted by periodic monitoring of thyroid function tests.    I spent  14  minutes in the care of the patient today including review of labs from Thyroid Function, CMP, and other relevant labs ; imaging/biopsy records (current and previous including abstractions from other facilities); face-to-face time discussing  her lab results and symptoms, medications doses, her options of short and long term treatment based on the latest standards of care / guidelines;   and documenting the encounter.  [name]  participated in the discussions, expressed understanding, and voiced agreement with the above plans.  All questions were answered to her satisfaction. she is encouraged to contact clinic  should she have any questions or concerns prior to her return visit.    FOLLOW UP PLAN:  Return in about 6 months (around 06/07/2024) for Thyroid follow up, Previsit labs.  Ronny Bacon, [facility] [address] [place], [place] [address] Phone: [phone] Fax: [phone]  12/06/2023, 4:06 PM

## 2023-12-06 NOTE — Progress Notes (Signed)
 Cardiology Office Note    Date:  12/08/2023  ID:  Susan Davidson, DOB Aug 16, 1951, MRN [id number] PCP:  Corwin Levins, MD  Cardiologist:  Christell Constant, MD  Electrophysiologist:  None   Chief Complaint: Follow up for CAD   History of Present Illness: Susan Davidson    Susan Davidson is a 73 y.o. female with visit-pertinent history of CAD with NSTEMI in 07/2023 that was treated medically, CVA in 01/2021, cerebral aneurysm (5 mm ACA aneurysm noted on brain MRI in 05/2023), hypertension, hyperlipidemia, CKD stage IIIa and severe dementia.  Patient was first seen by cardiology during admission in 07/2023 for NSTEMI.  Echo showed LVEF of 60 to 65% with no regional wall motion abnormalities and grade 1 diastolic dysfunction.  LHC showed 95% stenosis of mid RCA with otherwise only minimal disease, medical therapy was recommended given tortuosity and calcification of the vessel.  She was started on DAPT with aspirin and Plavix.  She developed a large hematoma of her right arm following cardiac catheterization that improved with compression.  Arterial ultrasound showed no evidence of pseudoaneurysm or AFV.  She was seen by vascular surgery and conservative management was recommended.  She was last seen in 08/2023, at that time she had remained stable from a cardiac standpoint.  She denied any chest pain or shortness of breath.  Today she presents for follow-up with her husband who helps provide history.  They report that she has been doing well.  Patient notes that she has had 2 episodes of some chest discomfort when she first laid down at night.  She notes she has a burning sensation in her throat as well as a sour sensation in her mouth, improves with belching.  Her husband reports that he gave her sublingual nitroglycerin with really no change in symptoms.  Her husband reports that they ran out of her Imdur and were taking metoprolol only once a day. She denies any other chest pain, denies chest pain with  exertion, she denies shortness of breath, lower extremity edema, orthopnea or PND.  She denies any palpitations.  ROS: .   Today she denies shortness of breath, lower extremity edema, fatigue, palpitations, melena, hematuria, hemoptysis, diaphoresis, weakness, presyncope, syncope, orthopnea, and PND.  All other systems are reviewed and otherwise negative. Studies Reviewed: Susan Davidson    EKG:  EKG is ordered today, personally reviewed, demonstrating  EKG Interpretation Date/Time:  Thursday December 07 2023 16:00:18 EST Ventricular Rate:  103 PR Interval:  172 QRS Duration:  82 QT Interval:  352 QTC Calculation: 461 R Axis:   7  Text Interpretation: Sinus tachycardia Low voltage QRS Inferior-posterior infarct (cited on or before 03-Aug-2023) When compared with ECG of 03-Aug-2023 04:54, Nonspecific T wave abnormality no longer evident in Inferior leads Confirmed by Reather Littler [phone]) on 12/08/2023 9:25:59 AM   CV Studies:  Cardiac Studies & Procedures   ______________________________________________________________________________________________ CARDIAC CATHETERIZATION  CARDIAC CATHETERIZATION 08/03/2023  Narrative   Mid RCA lesion is 95% stenosed.  1.  High-grade mid right coronary artery lesion.  After review with Dr. Izora Ribas and given its tortuosity and calcification in the context of the patient's comorbidities we have elected to pursue medical therapy.  If the patient is anginal symptoms cannot be controlled PCI can be considered. 2.  Mild disease of small caliber LAD and left circumflex. 3.  LVEDP of 10 mmHg:  Summary: Medical therapy for acute coronary syndrome.  A Plavix load was ordered.  If the patient's chest pain  cannot be controlled with medical therapy PCI can be considered.  Findings Coronary Findings Diagnostic  Dominance: Right  Left Anterior Descending Vessel is small. The vessel exhibits minimal luminal irregularities.  Left Circumflex The vessel exhibits minimal  luminal irregularities.  Right Coronary Artery Mid RCA lesion is 95% stenosed.  Intervention  No interventions have been documented.     ECHOCARDIOGRAM  ECHOCARDIOGRAM COMPLETE 08/03/2023  Narrative ECHOCARDIOGRAM REPORT    Patient Name:   Susan Davidson Date of Exam: 08/03/2023 Medical Rec #:  [id number]       Height:       62.0 in Accession #:    [id number]      Weight:       138.9 lb Date of Birth:  Jul 28, 1951        BSA:          1.637 m Patient Age:    72 years        BP:           133/90 mmHg Patient Gender: F               HR:           81 bpm. Exam Location:  Inpatient  Procedure: 2D Echo, Cardiac Doppler and Color Doppler  Indications:    NSTEMI  History:        Patient has prior history of Echocardiogram examinations, most recent 07/04/2021. Stroke and CKD; Risk Factors:Hypertension, Dyslipidemia and Former Smoker.  Sonographer:    Dondra Prader RVT RCS Referring Phys: [id number] DAMARCUS A INGRAM   Sonographer Comments: Technically challenging study due to limited acoustic windows, Technically difficult study due to poor echo windows, suboptimal parasternal window, suboptimal apical window and suboptimal subcostal window. Image acquisition challenging due to uncooperative patient and Image acquisition challenging due to respiratory motion. IMPRESSIONS   1. Left ventricular ejection fraction, by estimation, is 60 to 65%. The left ventricle has normal function. The left ventricle has no regional wall motion abnormalities. Left ventricular diastolic parameters are consistent with Grade I diastolic dysfunction (impaired relaxation). 2. Right ventricular systolic function is normal. The right ventricular size is normal. Tricuspid regurgitation signal is inadequate for assessing PA pressure. 3. A small pericardial effusion is present. 4. The mitral valve is normal in structure. No evidence of mitral valve regurgitation. 5. The aortic valve was not well visualized.  Aortic valve regurgitation is not visualized. 6. Aortic not well visualized. 7. The inferior vena cava is normal in size with greater than 50% respiratory variability, suggesting right atrial pressure of 3 mmHg.  FINDINGS Left Ventricle: Left ventricular ejection fraction, by estimation, is 60 to 65%. The left ventricle has normal function. The left ventricle has no regional wall motion abnormalities. The left ventricular internal cavity size was normal in size. There is no left ventricular hypertrophy. Left ventricular diastolic parameters are consistent with Grade I diastolic dysfunction (impaired relaxation).  Right Ventricle: The right ventricular size is normal. Right ventricular systolic function is normal. Tricuspid regurgitation signal is inadequate for assessing PA pressure.  Left Atrium: Left atrial size was normal in size.  Right Atrium: Right atrial size was normal in size.  Pericardium: A small pericardial effusion is present.  Mitral Valve: The mitral valve is normal in structure. No evidence of mitral valve regurgitation.  Tricuspid Valve: Tricuspid valve regurgitation is not demonstrated.  Aortic Valve: The aortic valve was not well visualized. Aortic valve regurgitation is not visualized. Aortic valve mean gradient measures 1.0 mmHg. Aortic  valve peak gradient measures 2.2 mmHg. Aortic valve area, by VTI measures 1.98 cm.  Pulmonic Valve: The pulmonic valve was not well visualized. Pulmonic valve regurgitation is not visualized.  Aorta: Not well visualized.  Venous: The inferior vena cava is normal in size with greater than 50% respiratory variability, suggesting right atrial pressure of 3 mmHg.  IAS/Shunts: The interatrial septum was not well visualized.   LEFT VENTRICLE PLAX 2D LVIDd:         3.70 cm   Diastology LVIDs:         1.90 cm   LV e' medial:    5.26 cm/s LV PW:         0.90 cm   LV E/e' medial:  11.0 LV IVS:        0.90 cm   LV e' lateral:   6.57  cm/s LVOT diam:     1.60 cm   LV E/e' lateral: 8.8 LV SV:         27 LV SV Index:   17 LVOT Area:     2.01 cm   RIGHT VENTRICLE             IVC RV S prime:     10.30 cm/s  IVC diam: 1.30 cm  LEFT ATRIUM           Index LA Vol (A4C): 14.0 ml 8.55 ml/m AORTIC VALVE                    PULMONIC VALVE AV Area (Vmax):    2.06 cm     PV Vmax:       1.17 m/s AV Area (Vmean):   2.00 cm     PV Peak grad:  5.5 mmHg AV Area (VTI):     1.98 cm AV Vmax:           74.37 cm/s AV Vmean:          49.967 cm/s AV VTI:            0.138 m AV Peak Grad:      2.2 mmHg AV Mean Grad:      1.0 mmHg LVOT Vmax:         76.20 cm/s LVOT Vmean:        49.600 cm/s LVOT VTI:          0.136 m LVOT/AV VTI ratio: 0.99  AORTA Ao Root diam: 2.60 cm  MITRAL VALVE MV Area (PHT): 3.85 cm    SHUNTS MV Decel Time: 197 msec    Systemic VTI:  0.14 m MV E velocity: 58.10 cm/s  Systemic Diam: 1.60 cm MV A velocity: 96.70 cm/s MV E/A ratio:  0.60  [place] signed by Carolan Clines Signature Date/Time: 08/03/2023/2:43:47 PM    Final          ______________________________________________________________________________________________        Current Reported Medications:.    Current Meds  Medication Sig   acetaminophen (TYLENOL) 325 MG tablet Take 2 tablets (650 mg total) by mouth every 6 (six) hours as needed for mild pain or headache (fever >/= 101).   albuterol (VENTOLIN HFA) 108 (90 Base) MCG/ACT inhaler Inhale 2 puffs into the lungs every 6 (six) hours as needed for wheezing or shortness of breath.   alendronate (FOSAMAX) 70 MG tablet Take 1 tablet (70 mg total) by mouth every 7 (seven) days. Take with a full glass of water on an empty stomach.   ALPRAZolam (XANAX) 0.5  MG tablet Take 1 tablet (0.5 mg total) by mouth 2 (two) times daily as needed for anxiety. TAKE 1 TABLET(0.5 MG) BY MOUTH TWICE DAILY AS NEEDED FOR ANXIETY Strength: 0.5 mg   ascorbic acid (VITAMIN C) 500 MG tablet  Take 1 tablet (500 mg total) by mouth daily. (Patient taking differently: Take 500 mg by mouth at bedtime.)   aspirin EC 81 MG tablet Take 1 tablet (81 mg total) by mouth daily. Swallow whole.   Cholecalciferol (VITAMIN D-3 PO) Take 1 capsule by mouth at bedtime.   clopidogrel (PLAVIX) 75 MG tablet Take 1 tablet (75 mg total) by mouth daily.   Coenzyme Q10 (COQ10 PO) Take 1 tablet by mouth at bedtime.   divalproex (DEPAKOTE) 250 MG DR tablet Take 1 tablet by mouth See admin instructions. 1 tablet in the morning and 2 tablets every evening   folic acid (FOLVITE) 800 MCG tablet Take 400 mcg by mouth at bedtime.   isosorbide mononitrate (IMDUR) 30 MG 24 hr tablet Take 1 tablet (30 mg total) by mouth daily.   lactose free nutrition (BOOST) LIQD Take 237 mLs by mouth 2 (two) times daily between meals. At least one a day and sometimes more   levothyroxine (SYNTHROID) 75 MCG tablet Take 1 tablet (75 mcg total) by mouth daily.   linaclotide (LINZESS) 145 MCG CAPS capsule Take 1 capsule (145 mcg total) by mouth daily.   melatonin 5 MG TABS Take 5 mg by mouth at bedtime.   memantine (NAMENDA) 5 MG tablet Take 1 tablet (5 mg total) by mouth 2 (two) times daily.   Multiple Vitamin (MULTIVITAMIN) tablet Take 1 tablet by mouth at bedtime.   OVER THE COUNTER MEDICATION Take 1 tablet by mouth at bedtime. magnesium   pantoprazole (PROTONIX) 40 MG tablet Take 1 tablet (40 mg total) by mouth daily.   PARoxetine (PAXIL) 40 MG tablet Take 40 mg by mouth every morning.   QUEtiapine (SEROQUEL) 25 MG tablet Take 25 mg by mouth at bedtime.    Physical Exam:    VS:  BP 131/71 (BP Location: Right Arm, Patient Position: Sitting, Cuff Size: Normal)   Pulse 94   Ht 5\' 2"  (1.575 m)   SpO2 97%   BMI 25.61 kg/m    Wt Readings from Last 3 Encounters:  12/06/23 140 lb (63.5 kg)  08/03/23 138 lb 7.2 oz (62.8 kg)  07/31/23 140 lb (63.5 kg)    GEN: Well nourished, well developed in no acute distress NECK: No JVD; No  carotid bruits CARDIAC: RRR, no murmurs, rubs, gallops RESPIRATORY:  Clear to auscultation without rales, wheezing or rhonchi  ABDOMEN: Soft, non-tender, non-distended EXTREMITIES:  No edema; No acute deformity   Asessement and Plan:.    CAD: History of NSTEMI in 07/2023.  LHC showed 95% stenosis of mid RCA with otherwise only minimal disease.  Medical therapy was recommended given tortuosity and calcification of the vessel.  Today patient reports 2 episodes of chest discomfort with associated sensation of burning in her throat and a sour taste in her mouth that is improved with belching.  Her husband reports that he gave her sublingual nitroglycerin with both episodes with no real change in symptoms.  Overall sounds likely to be indigestion, unclear if she has been regularly taking Protonix, they will follow up with PCP.  She denies any other occurrences of chest pain.  Of note her husband reports that she has not taken Imdur in recent weeks as they did run out of  the medication, on chart review her blood pressure was slightly soft yesterday, they have not been monitoring her blood pressure regularly at home, encouraged to monitor at home and bring on follow-up.  Reviewed ED precautions.  Given some softer blood pressures will restart Imdur at 30 mg daily and restart Lopressor 12.5 mg twice daily. Continue aspirin and Plavix.  Continue high intensity statin.  Hypertension: Blood pressure today 131/71.  Patient's husband reports she has not been regularly taking Imdur, on chart review she has had some softer blood pressures at recent office visits.  Will restart Imdur at 30 mg daily, encouraged patient and her husband to monitor her blood pressures at home and notify the office of results.  Start Imdur 30 mg daily and metoprolol tartrate 12.5 mg twice daily.  Hyperlipidemia: Lipid panel in 07/2023 indicated total cholesterol 146, triglycerides 92, HDL 39, LDL 89.  LDL goal is less than 55 given history  of MI and stroke.  In 07/2023 her Crestor was increased to 20 mg daily.  Check fasting lipid profile and LFTs.  CKD stage IIIa: Baseline creatinine around 0.7-1.1.  In 08/2023 creatinine was 0.8.    Disposition: F/u with Reather Littler, NP in 3-4 weeks.   Signed, Rip Harbour, NP

## 2023-12-07 ENCOUNTER — Encounter: Payer: Self-pay | Admitting: Cardiology

## 2023-12-07 ENCOUNTER — Ambulatory Visit: Payer: Medicare HMO | Attending: Cardiology | Admitting: Cardiology

## 2023-12-07 ENCOUNTER — Ambulatory Visit: Payer: Self-pay

## 2023-12-07 VITALS — BP 131/71 | HR 94 | Ht 62.0 in

## 2023-12-07 DIAGNOSIS — E7849 Other hyperlipidemia: Secondary | ICD-10-CM | POA: Diagnosis not present

## 2023-12-07 DIAGNOSIS — I10 Essential (primary) hypertension: Secondary | ICD-10-CM | POA: Diagnosis not present

## 2023-12-07 DIAGNOSIS — I2510 Atherosclerotic heart disease of native coronary artery without angina pectoris: Secondary | ICD-10-CM | POA: Diagnosis not present

## 2023-12-07 DIAGNOSIS — E785 Hyperlipidemia, unspecified: Secondary | ICD-10-CM | POA: Diagnosis not present

## 2023-12-07 DIAGNOSIS — I252 Old myocardial infarction: Secondary | ICD-10-CM

## 2023-12-07 DIAGNOSIS — N1831 Chronic kidney disease, stage 3a: Secondary | ICD-10-CM | POA: Diagnosis not present

## 2023-12-07 MED ORDER — ISOSORBIDE MONONITRATE ER 30 MG PO TB24: 30.0000 mg | ORAL_TABLET | Freq: Every day | ORAL | 1 refills | Status: DC

## 2023-12-07 MED ORDER — METOPROLOL TARTRATE 25 MG PO TABS
12.5000 mg | ORAL_TABLET | Freq: Two times a day (BID) | ORAL | 2 refills | Status: DC
Start: 2023-12-07 — End: 2024-02-23

## 2023-12-07 MED ORDER — ROSUVASTATIN CALCIUM 20 MG PO TABS: 20.0000 mg | ORAL_TABLET | Freq: Every day | ORAL | 2 refills | Status: DC

## 2023-12-07 NOTE — Patient Instructions (Addendum)
 Medication Instructions:  BEGIN IMDUR 30MG . TAKE 1 TABLET DAILY.  START METOPROLOL TARTRATE 12.5MG . TAKE ONE HALF TABLET 2 TIMES DAILY.  YOUR REFILL FOR ROSUVASTATIN HAS BEEN SENT TO YOUR PHARMACY.  CHECK YOUR BLOOD PRESSURE DAILY. RECORD READINGS ON THE BLOOD PRESSURE LOG.   *If you need a refill on your cardiac medications before your next appointment, please call your pharmacy*   Lab Work: RETURN FOR FASTING LABS If you have labs (blood work) drawn today and your tests are completely normal, you will receive your results only by: MyChart Message (if you have MyChart) OR A paper copy in the mail If you have any lab test that is abnormal or we need to change your treatment, we will call you to review the results.   Testing/Procedures: No procedures were ordered during today's visit.    Follow-Up: At [facility], you and your health needs are our priority.  As part of our continuing mission to provide you with exceptional heart care, we have created designated Provider Care Teams.  These Care Teams include your primary Cardiologist (physician) and Advanced Practice Providers (APPs -  Physician Assistants and Nurse Practitioners) who all work together to provide you with the care you need, when you need it.  We recommend signing up for the patient portal called "MyChart".  Sign up information is provided on this After Visit Summary.  MyChart is used to connect with patients for Virtual Visits (Telemedicine).  Patients are able to view lab/test results, encounter notes, upcoming appointments, etc.  Non-urgent messages can be sent to your provider as well.   To learn more about what you can do with MyChart, go to [email or web address].    Your next appointment:   2-4 week(s)  Provider:   Reather Littler, NP        Other Instructions Thank you for choosing [facility]!

## 2023-12-07 NOTE — Patient Instructions (Signed)
 Visit Information  Thank you for taking time to visit with me today. Please don't hesitate to contact me if I can be of assistance to you.   Following are the goals we discussed today:  Continue to take medications as prescribed. Continue to attend provider visits as scheduled Continue to encourage to eat healthy and take supplement as recommended Contact provider with health questions or concerns as needed   Please call the care guide team at [phone] if you need to schedule an appointment.   If you are experiencing a Mental Health or Behavioral Health Crisis or need someone to talk to, please call the Suicide and Crisis Lifeline: 988 call the [place] National Suicide Prevention Lifeline: 725 647 2747 or TTY: 231-539-4800 TTY 769-626-8770) to talk to a trained counselor  Kathyrn Sheriff, RN, MSN, BSN, CCM [place]  [facility], Population Health Case Manager Phone: [phone]

## 2023-12-07 NOTE — Patient Outreach (Signed)
 Care Coordination   Follow Up Visit Note   12/07/2023 Name: Susan Davidson MRN: [id number] DOB: 04/06/1951  Susan Davidson is a 73 y.o. year old female who sees Susan Levins, MD for primary care. I spoke with  Susan Davidson by phone today.  What matters to the patients health and wellness today?  Susan Davidson reports patient is doing about the same. He states he is in communication with Susan Rud, NP with Palliative services. He states patient prescribed, Seroquel and coQ10. He reports patient slept well last night. Patient has an appointment with cardiology today. He states Susan Davidson is eating about the same, but is drinking 3-4 cans of boost daily. Patient continues to have in home care providers M-F and every other Saturday. RNCM provided resource for [facility] [phone]. No specific questions or concerns at this time. Susan Davidson to contact [facility] and/or PCP if care management needs in the future.  Goals Addressed             This Visit's Progress    COMPLETED: Patient support/Assist with health management       Interventions Today    Flowsheet Row Most Recent Value  Chronic Disease   Chronic disease during today's visit Other, Chronic Kidney Disease/End Stage Renal Disease (ESRD), Hypertension (HTN)  [dementia]  General Interventions   General Interventions Discussed/Reviewed General Interventions Reviewed, Doctor Visits  [reviewed care management services. provided RNCM contact number and encouraged to call or contact PCP if care management needs in the future.]  Doctor Visits Discussed/Reviewed PCP, Specialist  PCP/Specialist Visits Compliance with follow-up visit  [reviewed upcoming appointments]  Education Interventions   Education Provided Provided Education  Provided Verbal Education On [facility]  [[facility] discussed-contact number provided and encouraged to contact to hear about the services they provide. advised continue to take medications as  prescribed, attend provider visits as scheduled, encourage healthy eating]  Nutrition Interventions   Nutrition Discussed/Reviewed Nutrition Reviewed  Pharmacy Interventions   Pharmacy Dicussed/Reviewed Pharmacy Topics Reviewed  Safety Interventions   Safety Discussed/Reviewed Safety Reviewed, Home Safety  [confirmed patient continues to have in home care services.]            SDOH assessments and interventions completed:  No  SDOH Interventions Today    Flowsheet Row Most Recent Value  SDOH Interventions   Food Insecurity Interventions Intervention Not Indicated  Housing Interventions Intervention Not Indicated  Transportation Interventions Intervention Not Indicated  Utilities Interventions Intervention Not Indicated     Care Coordination Interventions:  Yes, provided   Follow up plan: No further intervention required.   Encounter Outcome:  Patient Visit Completed   Kathyrn Sheriff, RN, MSN, BSN, CCM [place]  [facility], Population Health Case Manager Phone: [phone]

## 2023-12-08 ENCOUNTER — Encounter: Payer: Self-pay | Admitting: Cardiology

## 2023-12-11 ENCOUNTER — Encounter: Payer: Self-pay | Admitting: Internal Medicine

## 2023-12-11 ENCOUNTER — Telehealth: Payer: Self-pay

## 2023-12-11 NOTE — Telephone Encounter (Signed)
 Patient identification verified by 2 forms. Shade Flood, RN     Per DPR Called patients spouse Chau Savell. No answer. LDVTCB   Loraine Leriche presented to office today as a walk in. Requested ALENDRONATE 70 MG be refilled and sent to [facility]. This medication was prescribed and is managed by patients PCP. Chart review does not show it was ever filled by our office.  Unable to fulfill refill request at this time.

## 2023-12-11 NOTE — Telephone Encounter (Signed)
 Error

## 2023-12-31 NOTE — Progress Notes (Unsigned)
 Cardiology Office Note    Date:  01/03/2024  ID:  Susan Davidson [birth year], MRN [id number] PCP:  Corwin Levins, MD  Cardiologist:  Christell Constant, MD  Electrophysiologist:  None   Chief Complaint: Follow up for CAD   History of Present Illness: [name] Kitchen    Susan Davidson is a 73 y.o. female with visit-pertinent history of CAD with NSTEMI in 07/2023 that was treated medically, CVA in 01/2021, cerebral aneurysm (5 mm ACA aneurysm noted on brain MRI in 05/2023), hypertension, hyperlipidemia, CKD stage IIIa and severe dementia.  Patient was first seen by cardiology during admission in 07/2023 for NSTEMI.  Echo showed LVEF of 60 to 65% with no regional wall motion abnormalities and grade 1 diastolic dysfunction.  LHC showed 95% stenosis of mid RCA with otherwise only minimal disease, medical therapy was recommended given tortuosity and calcification of the vessel.  She was started on DAPT with aspirin and Plavix.  She developed a large hematoma of her right arm following cardiac catheterization that improved with compression.  Arterial ultrasound showed no evidence of pseudoaneurysm or AFV.  She was seen by vascular surgery and conservative management was recommended.  She was seen in 08/2023, at that time she had remained stable from a cardiac standpoint.  She denied any chest pain or shortness of breath.  Patient was seen in clinic with her husband on 12/07/2023.  They report that she had been overall doing well.  Patient reported she had 2 episodes of chest discomfort when first lying down at night, described a burning sensation in her throat as well as a sour sensation in her mouth, improved with belching.  Her husband reported that he gave her sublingual nitroglycerin with really no change in symptoms.  Her husband reports they ran out of her Imdur and were taking metoprolol only once a day.  Patient denied any other chest pain or chest pain with exertion.  On chart review patient's blood  pressure had been previously slightly soft, they have not been monitoring her blood pressure at home.  Started Imdur at 30 mg daily and Lopressor at 12.5 mg twice daily, encouraged to monitor blood pressure and bring log to follow-up.  Today she presents for follow-up with her husband who assist with history.  Patient has significant dementia after suffering a stroke. They report that she is overall doing well.  They deny any reports of chest pain or increased shortness of breath.  Her husband denies any increased lower extremity edema.  She denies any orthopnea or PND, lightheadedness, dizziness or syncope.  Her husband has confirmed this.  They have not been regularly monitoring her blood pressure at home, per her husband she appears to be tolerating Imdur and metoprolol well. ROS: .   Today she denies chest pain, shortness of breath, lower extremity edema, fatigue, palpitations, melena, hematuria, hemoptysis, diaphoresis, weakness, presyncope, syncope, orthopnea, and PND.  All other systems are reviewed and otherwise negative. Studies Reviewed: [name] Kitchen   EKG:  EKG is not ordered today.  CV Studies: Cardiac studies reviewed are outlined and summarized above. Otherwise please see EMR for full report. Cardiac Studies & Procedures   ______________________________________________________________________________________________ CARDIAC CATHETERIZATION  CARDIAC CATHETERIZATION 08/03/2023  Narrative   Mid RCA lesion is 95% stenosed.  1.  High-grade mid right coronary artery lesion.  After review with Dr. Izora Ribas and given its tortuosity and calcification in the context of the patient's comorbidities we have elected to pursue medical therapy.  If the  patient is anginal symptoms cannot be controlled PCI can be considered. 2.  Mild disease of small caliber LAD and left circumflex. 3.  LVEDP of 10 mmHg:  Summary: Medical therapy for acute coronary syndrome.  A Plavix load was ordered.  If the patient's  chest pain cannot be controlled with medical therapy PCI can be considered.  Findings Coronary Findings Diagnostic  Dominance: Right  Left Anterior Descending Vessel is small. The vessel exhibits minimal luminal irregularities.  Left Circumflex The vessel exhibits minimal luminal irregularities.  Right Coronary Artery Mid RCA lesion is 95% stenosed.  Intervention  No interventions have been documented.     ECHOCARDIOGRAM  ECHOCARDIOGRAM COMPLETE 08/03/2023  Narrative ECHOCARDIOGRAM REPORT    Patient Name:   Susan Davidson Date of Exam: 08/03/2023 Medical Rec #:  [id number]       Height:       62.0 in Accession #:    [id number]      Weight:       138.9 lb Date of Birth:  07/28/1951        BSA:          1.637 m Patient Age:    72 years        BP:           133/90 mmHg Patient Gender: F               HR:           81 bpm. Exam Location:  Inpatient  Procedure: 2D Echo, Cardiac Doppler and Color Doppler  Indications:    NSTEMI  History:        Patient has prior history of Echocardiogram examinations, most recent 07/04/2021. Stroke and CKD; Risk Factors:Hypertension, Dyslipidemia and Former Smoker.  Sonographer:    Dondra Prader RVT RCS Referring Phys: [id number] DAMARCUS A INGRAM   Sonographer Comments: Technically challenging study due to limited acoustic windows, Technically difficult study due to poor echo windows, suboptimal parasternal window, suboptimal apical window and suboptimal subcostal window. Image acquisition challenging due to uncooperative patient and Image acquisition challenging due to respiratory motion. IMPRESSIONS   1. Left ventricular ejection fraction, by estimation, is 60 to 65%. The left ventricle has normal function. The left ventricle has no regional wall motion abnormalities. Left ventricular diastolic parameters are consistent with Grade I diastolic dysfunction (impaired relaxation). 2. Right ventricular systolic function is normal. The right  ventricular size is normal. Tricuspid regurgitation signal is inadequate for assessing PA pressure. 3. A small pericardial effusion is present. 4. The mitral valve is normal in structure. No evidence of mitral valve regurgitation. 5. The aortic valve was not well visualized. Aortic valve regurgitation is not visualized. 6. Aortic not well visualized. 7. The inferior vena cava is normal in size with greater than 50% respiratory variability, suggesting right atrial pressure of 3 mmHg.  FINDINGS Left Ventricle: Left ventricular ejection fraction, by estimation, is 60 to 65%. The left ventricle has normal function. The left ventricle has no regional wall motion abnormalities. The left ventricular internal cavity size was normal in size. There is no left ventricular hypertrophy. Left ventricular diastolic parameters are consistent with Grade I diastolic dysfunction (impaired relaxation).  Right Ventricle: The right ventricular size is normal. Right ventricular systolic function is normal. Tricuspid regurgitation signal is inadequate for assessing PA pressure.  Left Atrium: Left atrial size was normal in size.  Right Atrium: Right atrial size was normal in size.  Pericardium: A small pericardial effusion is present.  Mitral Valve: The mitral valve is normal in structure. No evidence of mitral valve regurgitation.  Tricuspid Valve: Tricuspid valve regurgitation is not demonstrated.  Aortic Valve: The aortic valve was not well visualized. Aortic valve regurgitation is not visualized. Aortic valve mean gradient measures 1.0 mmHg. Aortic valve peak gradient measures 2.2 mmHg. Aortic valve area, by VTI measures 1.98 cm.  Pulmonic Valve: The pulmonic valve was not well visualized. Pulmonic valve regurgitation is not visualized.  Aorta: Not well visualized.  Venous: The inferior vena cava is normal in size with greater than 50% respiratory variability, suggesting right atrial pressure of 3  mmHg.  IAS/Shunts: The interatrial septum was not well visualized.   LEFT VENTRICLE PLAX 2D LVIDd:         3.70 cm   Diastology LVIDs:         1.90 cm   LV e' medial:    5.26 cm/s LV PW:         0.90 cm   LV E/e' medial:  11.0 LV IVS:        0.90 cm   LV e' lateral:   6.57 cm/s LVOT diam:     1.60 cm   LV E/e' lateral: 8.8 LV SV:         27 LV SV Index:   17 LVOT Area:     2.01 cm   RIGHT VENTRICLE             IVC RV S prime:     10.30 cm/s  IVC diam: 1.30 cm  LEFT ATRIUM           Index LA Vol (A4C): 14.0 ml 8.55 ml/m AORTIC VALVE                    PULMONIC VALVE AV Area (Vmax):    2.06 cm     PV Vmax:       1.17 m/s AV Area (Vmean):   2.00 cm     PV Peak grad:  5.5 mmHg AV Area (VTI):     1.98 cm AV Vmax:           74.37 cm/s AV Vmean:          49.967 cm/s AV VTI:            0.138 m AV Peak Grad:      2.2 mmHg AV Mean Grad:      1.0 mmHg LVOT Vmax:         76.20 cm/s LVOT Vmean:        49.600 cm/s LVOT VTI:          0.136 m LVOT/AV VTI ratio: 0.99  AORTA Ao Root diam: 2.60 cm  MITRAL VALVE MV Area (PHT): 3.85 cm    SHUNTS MV Decel Time: 197 msec    Systemic VTI:  0.14 m MV E velocity: 58.10 cm/s  Systemic Diam: 1.60 cm MV A velocity: 96.70 cm/s MV E/A ratio:  0.60  [place] signed by Carolan Clines Signature Date/Time: 08/03/2023/2:43:47 PM    Final          ______________________________________________________________________________________________       Current Reported Medications:.    Current Meds  Medication Sig   acetaminophen (TYLENOL) 325 MG tablet Take 2 tablets (650 mg total) by mouth every 6 (six) hours as needed for mild pain or headache (fever >/= 101).   albuterol (VENTOLIN HFA) 108 (90 Base) MCG/ACT inhaler Inhale 2 puffs into the lungs  every 6 (six) hours as needed for wheezing or shortness of breath.   alendronate (FOSAMAX) 70 MG tablet Take 1 tablet (70 mg total) by mouth every 7 (seven) days. Take with a  full glass of water on an empty stomach.   ALPRAZolam (XANAX) 0.5 MG tablet Take 1 tablet (0.5 mg total) by mouth 2 (two) times daily as needed for anxiety. TAKE 1 TABLET(0.5 MG) BY MOUTH TWICE DAILY AS NEEDED FOR ANXIETY Strength: 0.5 mg   ascorbic acid (VITAMIN C) 500 MG tablet Take 1 tablet (500 mg total) by mouth daily. (Patient taking differently: Take 500 mg by mouth at bedtime.)   aspirin EC 81 MG tablet Take 1 tablet (81 mg total) by mouth daily. Swallow whole.   Cholecalciferol (VITAMIN D-3 PO) Take 1 capsule by mouth at bedtime.   clopidogrel (PLAVIX) 75 MG tablet Take 1 tablet (75 mg total) by mouth daily.   Coenzyme Q10 (COQ10 PO) Take 1 tablet by mouth at bedtime.   divalproex (DEPAKOTE) 250 MG DR tablet Take 1 tablet by mouth See admin instructions. 1 tablet in the morning and 2 tablets every evening   folic acid (FOLVITE) 800 MCG tablet Take 400 mcg by mouth at bedtime.   isosorbide mononitrate (IMDUR) 30 MG 24 hr tablet Take 1 tablet (30 mg total) by mouth daily.   lactose free nutrition (BOOST) LIQD Take 237 mLs by mouth 2 (two) times daily between meals. At least one a day and sometimes more   levothyroxine (SYNTHROID) 75 MCG tablet Take 1 tablet (75 mcg total) by mouth daily.   linaclotide (LINZESS) 145 MCG CAPS capsule Take 1 capsule (145 mcg total) by mouth daily.   melatonin 5 MG TABS Take 5 mg by mouth at bedtime.   memantine (NAMENDA) 5 MG tablet Take 1 tablet (5 mg total) by mouth 2 (two) times daily.   metoprolol tartrate (LOPRESSOR) 25 MG tablet Take 0.5 tablets (12.5 mg total) by mouth 2 (two) times daily.   Multiple Vitamin (MULTIVITAMIN) tablet Take 1 tablet by mouth at bedtime.   OVER THE COUNTER MEDICATION Take 1 tablet by mouth at bedtime. magnesium   pantoprazole (PROTONIX) 40 MG tablet Take 1 tablet (40 mg total) by mouth daily.   PARoxetine (PAXIL) 40 MG tablet Take 40 mg by mouth every morning.   QUEtiapine (SEROQUEL) 25 MG tablet Take 25 mg by mouth at  bedtime.   rosuvastatin (CRESTOR) 20 MG tablet Take 1 tablet (20 mg total) by mouth at bedtime.   Physical Exam:    VS:  BP 124/76 (BP Location: Left Arm, Patient Position: Sitting, Cuff Size: Normal)   Pulse (!) 59   Ht 5\' 2"  (1.575 m)   Wt 140 lb (63.5 kg)   SpO2 96%   BMI 25.61 kg/m    Wt Readings from Last 3 Encounters:  01/02/24 140 lb (63.5 kg)  12/06/23 140 lb (63.5 kg)  08/03/23 138 lb 7.2 oz (62.8 kg)   GEN: Well nourished, well developed in no acute distress NECK: No JVD; No carotid bruits CARDIAC: RRR, no murmurs, rubs, gallops RESPIRATORY:  Clear to auscultation without rales, wheezing or rhonchi  ABDOMEN: Soft, non-tender, non-distended EXTREMITIES:  No edema; No acute deformity     Asessement and Plan:.    CAD: History of NSTEMI in 07/2023.  LHC showed 95% stenosis of mid RCA with otherwise only minimal disease.  Medical therapy was recommended given prosody and calcification of the vessel.  At last office visit patient noted  2 episodes of chest comfort associated with a burning sensation in her throat a sour taste in her mouth that improved with belching, felt to likely be related to GERD.  Patient denied any other occurrences of chest pain.  Her husband reported that she had not been taking Imdur in recent weeks as they ran out of the medication, restarted Imdur 30 mg daily as recent blood pressures have been soft at other office visits. Today patient reports that she is doing well, she denies any chest pain or increased shortness of breath.  Her husband confirms this.  He reports that she is overall been tolerating restarting Imdur and Lopressor well.  Given blood pressure has been well-controlled with history of softer blood pressures and she has not had any recurrent chest pain we will continue Imdur 30 mg daily.  Reviewed ED precautions. Continue aspirin, Plavix, Imdur and Lopressor and high intensity statin.  Hypertension: Blood pressure today 124/76. Continue current  antihypertensive regimen   Hyperlipidemia: Fasting lipid profile and LFTs currently pending.  Continue rosuvastatin 20 mg daily.  CKD stage IIIa: Baseline creatinine around 0.7-1.1.  Creatinine 0.8 in 08/2023.  Per patient's husband she is to have her annual physical soon, labs deferred at this time.   Disposition: F/u with Reather Littler, NP or Dr. Izora Ribas in 5-6 months or sooner if needed.   Signed, Rip Harbour, NP

## 2024-01-01 ENCOUNTER — Encounter (HOSPITAL_BASED_OUTPATIENT_CLINIC_OR_DEPARTMENT_OTHER): Payer: Self-pay

## 2024-01-01 DIAGNOSIS — E890 Postprocedural hypothyroidism: Secondary | ICD-10-CM | POA: Diagnosis not present

## 2024-01-02 ENCOUNTER — Ambulatory Visit: Attending: Cardiology | Admitting: Cardiology

## 2024-01-02 ENCOUNTER — Encounter: Payer: Self-pay | Admitting: Cardiology

## 2024-01-02 VITALS — BP 124/76 | HR 59 | Ht 62.0 in | Wt 140.0 lb

## 2024-01-02 DIAGNOSIS — N1831 Chronic kidney disease, stage 3a: Secondary | ICD-10-CM | POA: Diagnosis not present

## 2024-01-02 DIAGNOSIS — I2510 Atherosclerotic heart disease of native coronary artery without angina pectoris: Secondary | ICD-10-CM | POA: Diagnosis not present

## 2024-01-02 DIAGNOSIS — E785 Hyperlipidemia, unspecified: Secondary | ICD-10-CM | POA: Diagnosis not present

## 2024-01-02 DIAGNOSIS — I10 Essential (primary) hypertension: Secondary | ICD-10-CM | POA: Diagnosis not present

## 2024-01-02 LAB — TSH: TSH: 4.85 u[IU]/mL — ABNORMAL HIGH (ref 0.450–4.500)

## 2024-01-02 LAB — T4, FREE: Free T4: 1.22 ng/dL (ref 0.82–1.77)

## 2024-01-02 MED ORDER — NITROGLYCERIN 0.4 MG SL SUBL: 0.4000 mg | SUBLINGUAL_TABLET | SUBLINGUAL | 3 refills | Status: AC | PRN

## 2024-01-02 NOTE — Progress Notes (Signed)
 She did these too early, were meant to be done prior to her appt in September.  No changes to medication at this time.  Will wait to enter new orders until closer to appt time so she doesn't get them done too early again.

## 2024-01-02 NOTE — Progress Notes (Signed)
 noted

## 2024-01-02 NOTE — Patient Instructions (Signed)
 Medication Instructions:  No changes  Lab Work: None  Follow-Up: At [facility], you and your health needs are our priority.  As part of our continuing mission to provide you with exceptional heart care, our providers are all part of one team.  This team includes your primary Cardiologist (physician) and Advanced Practice Providers or APPs (Physician Assistants and Nurse Practitioners) who all work together to provide you with the care you need, when you need it.  Your next appointment:   4-5 month(s)  Provider:   Christell Constant, MD    We recommend signing up for the patient portal called "MyChart".  Sign up information is provided on this After Visit Summary.  MyChart is used to connect with patients for Virtual Visits (Telemedicine).  Patients are able to view lab/test results, encounter notes, upcoming appointments, etc.  Non-urgent messages can be sent to your provider as well.   To learn more about what you can do with MyChart, go to [email or web address].        Valet parking services will be available as well.

## 2024-01-03 ENCOUNTER — Encounter: Payer: Self-pay | Admitting: Cardiology

## 2024-01-11 ENCOUNTER — Other Ambulatory Visit: Payer: Self-pay | Admitting: Internal Medicine

## 2024-01-22 DIAGNOSIS — I639 Cerebral infarction, unspecified: Secondary | ICD-10-CM | POA: Diagnosis not present

## 2024-01-22 DIAGNOSIS — Z515 Encounter for palliative care: Secondary | ICD-10-CM | POA: Diagnosis not present

## 2024-01-22 DIAGNOSIS — I69354 Hemiplegia and hemiparesis following cerebral infarction affecting left non-dominant side: Secondary | ICD-10-CM | POA: Diagnosis not present

## 2024-01-30 ENCOUNTER — Ambulatory Visit (INDEPENDENT_AMBULATORY_CARE_PROVIDER_SITE_OTHER): Payer: Medicare HMO | Admitting: Adult Health

## 2024-01-30 ENCOUNTER — Encounter: Payer: Self-pay | Admitting: Adult Health

## 2024-01-30 VITALS — BP 122/57 | HR 59

## 2024-01-30 DIAGNOSIS — G8114 Spastic hemiplegia affecting left nondominant side: Secondary | ICD-10-CM

## 2024-01-30 DIAGNOSIS — I639 Cerebral infarction, unspecified: Secondary | ICD-10-CM

## 2024-01-30 DIAGNOSIS — F03C3 Unspecified dementia, severe, with mood disturbance: Secondary | ICD-10-CM | POA: Diagnosis not present

## 2024-01-30 DIAGNOSIS — I671 Cerebral aneurysm, nonruptured: Secondary | ICD-10-CM

## 2024-01-30 DIAGNOSIS — M489 Spondylopathy, unspecified: Secondary | ICD-10-CM | POA: Diagnosis not present

## 2024-01-30 NOTE — Progress Notes (Signed)
 Chief Complaint  Patient presents with   Cerebrovascular Accident    Rm 3 with spouse Pt is well and stable from a stroke standpoint. Spouse does reports more cognitive impairment, she does recognize their house, repetitive questions and did ask him who he was. She doesn't eat as well.       ASSESSMENT AND PLAN  Susan Davidson is a 73 y.o. 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, with residual left lower extremity more than left upper extremity weakness,  With residual left spastic hemiparesis and cognitive impairment  Continued gradual physical and cognitive decline suspect more multifactoral   Continue close follow-up with palliative care for dementia and symptomatic treatment  Continue DAPT (per cardiology recommendations) and Crestor  and continue close PCP and cardiology follow-up for aggressive stroke risk factor management  Cervical spine disease, chronic neck pain Known history of anterior communicating 5 mm aneurysm  Repeat MRA imaging 03/2023 stable 5 mm aneurysm, initially found in 2022  MRI C-spine 03/2023 possible left C4 nerve root compression  Evaluated by neurosurgery 04/2023, only able to be limited information via epic, will request OV note to be faxed for further review     No further recommendations from neurological standpoint and is closely being followed by PCP, palliative care, cardiology and endocrinology.  In attempts to limit unnecessary follow-up visits, she can follow up here on an as-needed basis but advised husband to call with any questions or concerns in the future      DIAGNOSTIC DATA (LABS, IMAGING, TESTING) - I reviewed patient records, labs, notes, testing and imaging myself where available.  MRA neck 03/06/2023 IMPRESSION:   This MR angiogram of the neck arteries shows the following: The study was limited as contrast could not be administered due to poor access. In this context, there appears to be about a 35% stenosis of the left  internal carotid artery near the origin and 20% stenosis of the right internal carotid artery near the origin.  Neither of these is hemodynamically significant.  This appears similar to the CT angiogram from 01/27/2021.   MRA head 03/06/2023 IMPRESSION: This MR angiogram of the intracranial arteries shows the following: 5 mm saccular aneurysm of the anterior cerebral artery, unchanged in appearance or size compared to the 01/27/2021 MRA 1.5 mm outpouching at the tip of the basilar artery could represent a small stable aneurysm or an arterial infundibulum.  No change compared with 01/27/2021 MRA. Reduced flow of the P1 segment of the left posterior cerebral artery.  Uncertain if this is developmental or due to atherosclerosis.  Moderate stenosis of the P2 segment of the left posterior cerebral artery is likely atherosclerotic.  This was noted on the previous MR angiogram.  In general, the posterior cerebral arteries are better imaged on the current study than on the 2022 study.  MRI C-spine 03/06/2023 IMPRESSION: This MRI of the cervical spine without contrast shows the following: The spinal cord appears normal. At C3-C4, there are degenerative changes causing moderately severe left foraminal narrowing but no spinal stenosis.  There is potential for left C4 nerve root compression. At C4-C5 and C5-C6 and C6-C7, degenerative changes cause mild spinal stenosis and moderate foraminal narrowing.  There does not appear to be nerve root compression.      MEDICAL HISTORY:  Update 01/30/2024 JM: Patient returns for follow-up visit after prior visit with Dr. Gracie Lav over 1 year ago accompanied by her husband who supplements history.  He reports continued decline both  physically and cognitively over the past year.  She was hospitalized in August for aspiration pneumonia/sepsis and again in October for MI which husband notes "set her back".  She is currently being followed by palliative care.  Husband notes  hallucinations (although not bothersome to patient), frequently forgetting parents have passed away, sleeps mostly during the day and awake at night and appetite is poor.  Palliative care initiated Seroquel with recent dose increase which seems to have helped sleep some.  She remains on memantine  per PCP.  She was also on Depakote  for mood, previously started by prior neurologist Dr. Joleen Navy.  She is nonambulatory, transfers via wheelchair.  She requires total care for ADLs.  Husband does have home aides to assist as he does still work. He does admit that it has been challenging continuing to care for her especially with her continued decline. She was seen by Dr. Nat Badger in July for cerebral aneurysm and cervical disc disease, husband does not recall this visit, only able to view limited information via epic.  Remains on aspirin , Plavix  and Crestor .  She is routinely followed by PCP and cardiology.     Consult visit 12/12/2022 Dr. Gracie Lav: Susan Davidson, is a 73 year old female, accompanied by her husband seen in request by her primary care Dr. Rosalia Colonel for evaluation of stroke, previously seen by Dr. Jeneen Mire, MD, initial evaluation was with her husband on December 12, 2022  I reviewed and summarized the referring note.  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, 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, CT angiogram of head and  neck confirmed 5 mm anterior communicating artery aneurysm Echocardiogram showed no significant abnormality  She had a prolonged rehabilitation stay, also multiple hospital readmission for mental status change, not eating well, COVID infection, in 2022, required prolonged nursing facility stating, now she is back home, has personal aide coming 9 AM to 6 PM, husband still go to work  She spent most of the time in wheelchair or lying down, feeling very depressed, poor appetite, sleepy, wants to follow-up for her stroke,  Over the past couple years, she also developed slow contraction of left elbow, left hands, especially left fifth fingers, neck pain, abnormal neck posturing, urinary incontinence       PHYSICAL EXAM:   Vitals:   01/30/24 1332  BP: (!) 122/57  Pulse: (!) 59   There is no height or weight on file to calculate BMI.  PHYSICAL EXAMNIATION:  Gen: NAD, frail pleasant elderly Caucasian female, seated in wheelchair                  Cardiovascular: Regular rate rhythm, no peripheral edema, warm, nontender. Eyes: Conjunctivae clear without exudates or hemorrhage Neck: Moderate right shoulder elevation, right turn, limited range of motion of neck, Pulmonary: Clear to auscultation bilaterally    NEUROLOGICAL EXAM:  MENTAL STATUS: Speech/cognition: Depressed looking elderly female, limited conversation, some difficulty following commands, oriented to self and month, disoriented to location and year  CRANIAL NERVES: CN II: Left hemivisual field deficit,. Pupils are round equal and briskly reactive to light. CN III, IV, VI: extraocular movement are normal. No ptosis. CN V: Facial sensation is intact to light touch CN VII: Left lower facial weakness CN VIII: Hearing is normal to causal conversation. CN IX, X: Phonation is normal. CN XI: Head turning and shoulder shrug are intact  MOTOR: Significant atrophy of left hand and contracture of fingers, antigravity movement of  left upper extremity, weak grip on left hand, right lower extremity has antigravity movement, barely antigravity movement of left lower extremity  REFLEXES: Brisk bilateral upper and lower extremity reflex, worsening on the left side  SENSORY: Mild length-dependent sensory changes  GAIT/STANCE: Deferred    REVIEW OF SYSTEMS:  Full 14 system review of systems performed and notable only for as above All other review of systems were negative.   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   Influenza Vac Split Quad     Fatigue, mimics a "super bad flu"    HOME MEDICATIONS: Current Outpatient Medications  Medication Sig Dispense Refill   acetaminophen  (TYLENOL ) 325 MG tablet Take 2 tablets (650 mg total) by mouth every 6 (six) hours as needed for mild pain or headache (fever >/= 101). 12 tablet 2   albuterol  (VENTOLIN  HFA) 108 (90 Base) MCG/ACT inhaler Inhale 2 puffs into the lungs every 6 (six) hours as needed for wheezing or shortness of breath. 17 each 2   alendronate  (FOSAMAX ) 70 MG tablet Take 1 tablet (70 mg total) by mouth every 7 (seven) days. Take with a full glass of water on an empty stomach. 12 tablet 3   ALPRAZolam  (XANAX ) 0.5 MG tablet TAKE 1 TABLET TWICE DAILY AS NEEDED FOR ANXIETY 60 tablet 2   ascorbic acid  (VITAMIN C) 500 MG tablet Take 1 tablet (500 mg total) by mouth daily. (Patient taking differently: Take 500 mg by mouth at bedtime.) 30 tablet 2   aspirin  EC 81 MG tablet Take 1 tablet (81 mg total) by mouth daily. Swallow whole. 30 tablet 12   Cholecalciferol (VITAMIN D -3 PO) Take 1 capsule by mouth at bedtime.     clopidogrel  (PLAVIX ) 75 MG tablet Take 1 tablet (75 mg total) by mouth daily. 90 tablet 3   Coenzyme Q10 (COQ10 PO)  Take 1 tablet by mouth at bedtime.     divalproex  (DEPAKOTE ) 250 MG DR tablet Take 1 tablet by mouth See admin instructions. 1 tablet in the morning and 2 tablets every evening     folic acid  (FOLVITE ) 800 MCG tablet Take 400 mcg by mouth at bedtime.     isosorbide  mononitrate (IMDUR ) 30 MG 24 hr tablet Take 1 tablet (30 mg total) by mouth daily. 90 tablet 1   lactose free nutrition (BOOST) LIQD Take 237 mLs by mouth 2 (two) times daily between meals. At least one a day and sometimes more     levothyroxine  (SYNTHROID ) 75 MCG tablet Take 1 tablet (75 mcg total) by mouth daily. 90 tablet 1   linaclotide  (LINZESS ) 145 MCG CAPS capsule Take 1 capsule (145 mcg total) by mouth daily. 90 capsule 1   melatonin 5 MG TABS Take 5 mg by mouth at bedtime.     memantine  (NAMENDA ) 5  MG tablet Take 1 tablet (5 mg total) by mouth 2 (two) times daily. 180 tablet 3   metoprolol  tartrate (LOPRESSOR ) 25 MG tablet Take 0.5 tablets (12.5 mg total) by mouth 2 (two) times daily. 30 tablet 2   Multiple Vitamin (MULTIVITAMIN) tablet Take 1 tablet by mouth at bedtime.     nitroGLYCERIN  (NITROSTAT ) 0.4 MG SL tablet Place 1 tablet (0.4 mg total) under the tongue every 5 (five) minutes as needed for chest pain. Up to 3 doses in one 24 hour period 45 tablet 3   OVER THE COUNTER MEDICATION Take 1 tablet by mouth at bedtime. magnesium     pantoprazole  (PROTONIX ) 40 MG tablet Take 1 tablet (40 mg total) by mouth daily. 90 tablet 3   PARoxetine  (PAXIL ) 40 MG tablet Take 40 mg by mouth every morning.     QUEtiapine (SEROQUEL) 50 MG tablet Take 50 mg by mouth at bedtime.     rosuvastatin  (CRESTOR ) 20 MG tablet Take 1 tablet (20 mg total) by mouth at bedtime. 30 tablet 2   No current facility-administered medications for this visit.    PAST MEDICAL HISTORY: Past Medical History:  Diagnosis Date   ANXIETY 02/12/2008   Qualifier: Diagnosis of  By: Autry Legions MD, Alveda Aures    CVA (cerebral vascular accident) [facility])    DEPRESSION 02/12/2008    Qualifier: Diagnosis of  By: Jarold Merlin    HYPERLIPIDEMIA 02/12/2008   Qualifier: Diagnosis of  By: Jarold Merlin    HYPERTENSION 02/12/2008   Qualifier: Diagnosis of  By: Jarold Merlin    HYPOTHYROIDISM 02/12/2008   Qualifier: Diagnosis of  By: Jarold Merlin    Impaired glucose tolerance 08/27/2011   Left hemiparesis (HCC)    OSTEOPENIA 02/12/2008   Qualifier: Diagnosis of  By: Autry Legions MD, Alveda Aures    VITAMIN D  DEFICIENCY 04/14/2010   Qualifier: Diagnosis of  By: Autry Legions MD, Alveda Aures     PAST SURGICAL HISTORY: Past Surgical History:  Procedure Laterality Date   IR RADIOLOGIST EVAL & MGMT  08/18/2021   LEFT HEART CATH AND CORONARY ANGIOGRAPHY N/A 08/03/2023   Procedure: LEFT HEART CATH AND CORONARY ANGIOGRAPHY;  Surgeon: Kyra Phy, MD;  Location: [facility] INVASIVE CV LAB;  Service: Cardiovascular;  Laterality: N/A;    FAMILY HISTORY: Family History  Problem Relation Age of Onset   Heart disease Father    Bipolar disorder Sister    Diabetes Neg Hx     SOCIAL HISTORY: Social History   Socioeconomic History   Marital status: Married    Spouse name: Not on file   Number of children: 0   Years of education: Not on file   Highest education level: Not on file  Occupational History   Occupation: disabled  Tobacco Use   Smoking status: Former    Current packs/day: 0.00    Types: Cigarettes    Quit date: 01/26/2021    Years since quitting: 3.0   Smokeless tobacco: Never  Vaping Use   Vaping status: Never Used  Substance and Sexual Activity   Alcohol use: Not Currently   Drug use: No   Sexual activity: Not on file  Other Topics Concern   Not on file  Social History Narrative   Not on file   Social Drivers of Health   Financial Resource Strain: Low Risk  (04/27/2023)   Overall Financial Resource Strain (CARDIA)    Difficulty of Paying Living Expenses: Not hard at all  Food Insecurity: No  Food Insecurity (12/07/2023)   Hunger  Vital Sign    Worried About Running Out of Food in the Last Year: Never true    Ran Out of Food in the Last Year: Never true  Transportation Needs: No Transportation Needs (12/07/2023)   PRAPARE - Administrator, Civil Service (Medical): No    Lack of Transportation (Non-Medical): No  Physical Activity: Inactive (04/27/2023)   Exercise Vital Sign    Days of Exercise per Week: 0 days    Minutes of Exercise per Session: 0 min  Stress: Stress Concern Present (04/27/2023)   [employer] of Occupational Health - Occupational Stress Questionnaire    Feeling of Stress : To some extent  Social Connections: Socially Isolated (04/27/2023)   Social Connection and Isolation Panel [NHANES]    Frequency of Communication with Friends and Family: Never    Frequency of Social Gatherings with Friends and Family: Never    Attends Religious Services: Never    Database administrator or Organizations: No    Attends Banker Meetings: Never    Marital Status: Married  Catering manager Violence: Not At Risk (08/03/2023)   Humiliation, Afraid, Rape, and Kick questionnaire    Fear of Current or Ex-Partner: No    Emotionally Abused: No    Physically Abused: No    Sexually Abused: No      I spent 40 minutes of face-to-face and non-face-to-face time with patient and husband.  This included previsit chart review, lab review, study review, order entry, electronic health record documentation, patient and husband education and discussion regarding above diagnoses and treatment plan and answered all other questions to patient and husband's satisfaction  Johny Nap, [facility]  [facility] [address] [place], [place] [address]  Phone [phone] Fax [phone] Note: This document was prepared with digital dictation and possible smart phrase technology. Any transcriptional errors that result from this process are unintentional.

## 2024-01-30 NOTE — Patient Instructions (Addendum)
 Your Plan:  Continue current stroke prevention medications and ensure routine close follow up with PCP and cardiology  Continue close follow up with palliative care  Will try to obtain notes from neurosurgery regarding brain imaging     Follow up as needed at this time. Please do not hesitate to call with any questions or concerns in the future.      Thank you for coming to see us  at [facility]. I hope we have been able to provide you high quality care today.  You may receive a patient satisfaction survey over the next few weeks. We would appreciate your feedback and comments so that we may continue to improve ourselves and the health of our patients.

## 2024-01-31 ENCOUNTER — Ambulatory Visit: Payer: Medicare HMO | Admitting: Internal Medicine

## 2024-01-31 ENCOUNTER — Telehealth: Payer: Self-pay

## 2024-01-31 NOTE — Telephone Encounter (Signed)
-----   Message from Johny Nap sent at 01/31/2024 10:09 AM EDT ----- Please contact husband re: recommendations from neurosurgery, requested to be called.  Per note from OV in 04/2023, recommended follow-up MRI brain in 18 months (around 10/2024) and rec'd PT for chronic neck pain. If husband has any further questions, he can reach out to [place] neurosurgery Dr. Nat Badger. Than you.

## 2024-01-31 NOTE — Telephone Encounter (Signed)
 Attempted to call husband. No answer, LVM for call back.

## 2024-02-01 ENCOUNTER — Ambulatory Visit (INDEPENDENT_AMBULATORY_CARE_PROVIDER_SITE_OTHER): Admitting: Internal Medicine

## 2024-02-01 ENCOUNTER — Encounter: Payer: Self-pay | Admitting: Internal Medicine

## 2024-02-01 VITALS — BP 122/70 | HR 62 | Temp 98.9°F | Ht 62.0 in

## 2024-02-01 DIAGNOSIS — F0390 Unspecified dementia without behavioral disturbance: Secondary | ICD-10-CM

## 2024-02-01 DIAGNOSIS — E538 Deficiency of other specified B group vitamins: Secondary | ICD-10-CM

## 2024-02-01 DIAGNOSIS — N1832 Chronic kidney disease, stage 3b: Secondary | ICD-10-CM

## 2024-02-01 DIAGNOSIS — R7302 Impaired glucose tolerance (oral): Secondary | ICD-10-CM | POA: Diagnosis not present

## 2024-02-01 DIAGNOSIS — I10 Essential (primary) hypertension: Secondary | ICD-10-CM | POA: Diagnosis not present

## 2024-02-01 DIAGNOSIS — R310 Gross hematuria: Secondary | ICD-10-CM

## 2024-02-01 DIAGNOSIS — E782 Mixed hyperlipidemia: Secondary | ICD-10-CM | POA: Diagnosis not present

## 2024-02-01 DIAGNOSIS — E559 Vitamin D deficiency, unspecified: Secondary | ICD-10-CM | POA: Diagnosis not present

## 2024-02-01 DIAGNOSIS — E039 Hypothyroidism, unspecified: Secondary | ICD-10-CM

## 2024-02-01 DIAGNOSIS — Z0000 Encounter for general adult medical examination without abnormal findings: Secondary | ICD-10-CM | POA: Diagnosis not present

## 2024-02-01 DIAGNOSIS — Z0001 Encounter for general adult medical examination with abnormal findings: Secondary | ICD-10-CM

## 2024-02-01 LAB — CBC WITH DIFFERENTIAL/PLATELET
Basophils Absolute: 0 10*3/uL (ref 0.0–0.1)
Basophils Relative: 0.4 % (ref 0.0–3.0)
Eosinophils Absolute: 0.2 10*3/uL (ref 0.0–0.7)
Eosinophils Relative: 2.5 % (ref 0.0–5.0)
HCT: 43.4 % (ref 36.0–46.0)
Hemoglobin: 14.1 g/dL (ref 12.0–15.0)
Lymphocytes Relative: 29.1 % (ref 12.0–46.0)
Lymphs Abs: 2.1 10*3/uL (ref 0.7–4.0)
MCHC: 32.5 g/dL (ref 30.0–36.0)
MCV: 103.5 fl — ABNORMAL HIGH (ref 78.0–100.0)
Monocytes Absolute: 0.5 10*3/uL (ref 0.1–1.0)
Monocytes Relative: 7.3 % (ref 3.0–12.0)
Neutro Abs: 4.5 10*3/uL (ref 1.4–7.7)
Neutrophils Relative %: 60.7 % (ref 43.0–77.0)
Platelets: 207 10*3/uL (ref 150.0–400.0)
RBC: 4.19 Mil/uL (ref 3.87–5.11)
RDW: 14.7 % (ref 11.5–15.5)
WBC: 7.4 10*3/uL (ref 4.0–10.5)

## 2024-02-01 LAB — LIPID PANEL
Cholesterol: 155 mg/dL (ref 0–200)
HDL: 48.8 mg/dL (ref >39.00)
LDL Cholesterol: 59 mg/dL (ref 0–99)
NonHDL: 105.82
Total CHOL/HDL Ratio: 3
Triglycerides: 235 mg/dL — ABNORMAL HIGH (ref 0.0–149.0)
VLDL: 47 mg/dL — ABNORMAL HIGH (ref 0.0–40.0)

## 2024-02-01 LAB — VITAMIN D 25 HYDROXY (VIT D DEFICIENCY, FRACTURES): VITD: 73.57 ng/mL (ref 30.00–100.00)

## 2024-02-01 LAB — VITAMIN B12: Vitamin B-12: 1228 pg/mL — ABNORMAL HIGH (ref 211–911)

## 2024-02-01 LAB — BASIC METABOLIC PANEL WITH GFR
BUN: 27 mg/dL — ABNORMAL HIGH (ref 6–23)
CO2: 29 meq/L (ref 19–32)
Calcium: 9.2 mg/dL (ref 8.4–10.5)
Chloride: 105 meq/L (ref 96–112)
Creatinine, Ser: 0.92 mg/dL (ref 0.40–1.20)
GFR: 61.21 mL/min (ref >60.00)
Glucose, Bld: 85 mg/dL (ref 70–99)
Potassium: 4.2 meq/L (ref 3.5–5.1)
Sodium: 142 meq/L (ref 135–145)

## 2024-02-01 LAB — HEPATIC FUNCTION PANEL
ALT: 11 U/L (ref 0–35)
AST: 17 U/L (ref 0–37)
Albumin: 4 g/dL (ref 3.5–5.2)
Alkaline Phosphatase: 43 U/L (ref 39–117)
Bilirubin, Direct: 0 mg/dL (ref 0.0–0.3)
Total Bilirubin: 0.2 mg/dL (ref 0.2–1.2)
Total Protein: 7 g/dL (ref 6.0–8.3)

## 2024-02-01 LAB — TSH: TSH: 8.16 u[IU]/mL — ABNORMAL HIGH (ref 0.35–5.50)

## 2024-02-01 MED ORDER — CEPHALEXIN 500 MG PO CAPS: 500.0000 mg | ORAL_CAPSULE | Freq: Three times a day (TID) | ORAL | 0 refills | Status: DC

## 2024-02-01 NOTE — Assessment & Plan Note (Signed)
Lab Results  Component Value Date   HGBA1C 6.3 04/10/2023   Stable, pt to continue current medical treatment  - diet, wt control

## 2024-02-01 NOTE — Progress Notes (Signed)
 Patient ID: Susan Davidson, female   DOB: 1951/05/09, 73 y.o.   MRN: [id number]         Chief Complaint:: wellness exam and gross hematuria, dementia, low thyroid , low vit d, hld       HPI:  Susan Davidson is a 73 y.o. female here for wellness exam; pt is non candidate for mammogram due to not standing, high risk for colonoscopy, for tdap and shingrix at pharrmacy, o/w up to date                        Also aide has noticed small amounts more than once of trace gross blood with urination, husband today noted low energy in last few days.  Dementia overall stable symptomatically, and not assoc with behavioral changes such as paranoia, or agitation but does have hallucinations that only frustrate her when others cannot see them.  Pt unable to give urine specimen today/  .   Wt Readings from Last 3 Encounters:  01/02/24 140 lb (63.5 kg)  12/06/23 140 lb (63.5 kg)  08/03/23 138 lb 7.2 oz (62.8 kg)   BP Readings from Last 3 Encounters:  02/01/24 122/70  01/30/24 (!) 122/57  01/02/24 124/76   Immunization History  Administered Date(s) Administered   Influenza-Unspecified 08/18/2021   Moderna Sars-Covid-2 Vaccination 12/02/2019   PNEUMOCOCCAL CONJUGATE-20 05/29/2023   Tdap 10/19/2012   There are no preventive care reminders to display for this patient.     Past Medical History:  Diagnosis Date   ANXIETY 02/12/2008   Qualifier: Diagnosis of  By: Autry Legions MD, Alveda Aures    CVA (cerebral vascular accident) [facility])    DEPRESSION 02/12/2008   Qualifier: Diagnosis of  By: Jarold Merlin    HYPERLIPIDEMIA 02/12/2008   Qualifier: Diagnosis of  By: Jarold Merlin    HYPERTENSION 02/12/2008   Qualifier: Diagnosis of  By: Jarold Merlin    HYPOTHYROIDISM 02/12/2008   Qualifier: Diagnosis of  By: Jarold Merlin    Impaired glucose tolerance 08/27/2011   Left hemiparesis (HCC)    OSTEOPENIA 02/12/2008   Qualifier: Diagnosis of  By: Autry Legions MD, Alveda Aures    VITAMIN D   DEFICIENCY 04/14/2010   Qualifier: Diagnosis of  By: Autry Legions MD, Alveda Aures    Past Surgical History:  Procedure Laterality Date   IR RADIOLOGIST EVAL & MGMT  08/18/2021   LEFT HEART CATH AND CORONARY ANGIOGRAPHY N/A 08/03/2023   Procedure: LEFT HEART CATH AND CORONARY ANGIOGRAPHY;  Surgeon: Kyra Phy, MD;  Location: [facility] INVASIVE CV LAB;  Service: Cardiovascular;  Laterality: N/A;    reports that she quit smoking about 3 years ago. Her smoking use included cigarettes. She has never used smokeless tobacco. She reports that she does not currently use alcohol. She reports that she does not use drugs. family history includes Bipolar disorder in her sister; Heart disease in her father. 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   Influenza Vac Split Quad     Fatigue, mimics a "super bad flu"   Current Outpatient Medications on File Prior to Visit  Medication Sig Dispense Refill   acetaminophen  (TYLENOL ) 325 MG tablet Take 2 tablets (650 mg total) by mouth every 6 (six) hours as  needed for mild pain or headache (fever >/= 101). 12 tablet 2   albuterol  (VENTOLIN  HFA) 108 (90 Base) MCG/ACT inhaler Inhale 2 puffs into the lungs every 6 (six) hours as needed for wheezing or shortness of breath. 17 each 2   alendronate  (FOSAMAX ) 70 MG tablet Take 1 tablet (70 mg total) by mouth every 7 (seven) days. Take with a full glass of water on an empty stomach. 12 tablet 3   ALPRAZolam  (XANAX ) 0.5 MG tablet TAKE 1 TABLET TWICE DAILY AS NEEDED FOR ANXIETY 60 tablet 2   ascorbic acid  (VITAMIN C) 500 MG tablet Take 1 tablet (500 mg total) by mouth daily. (Patient taking differently: Take 500 mg by mouth at bedtime.) 30 tablet 2   aspirin  EC 81 MG tablet Take 1 tablet (81 mg total) by mouth  daily. Swallow whole. 30 tablet 12   Cholecalciferol (VITAMIN D -3 PO) Take 1 capsule by mouth at bedtime.     clopidogrel  (PLAVIX ) 75 MG tablet Take 1 tablet (75 mg total) by mouth daily. 90 tablet 3   Coenzyme Q10 (COQ10 PO) Take 1 tablet by mouth at bedtime.     divalproex  (DEPAKOTE ) 250 MG DR tablet Take 1 tablet by mouth See admin instructions. 1 tablet in the morning and 2 tablets every evening     folic acid  (FOLVITE ) 800 MCG tablet Take 400 mcg by mouth at bedtime.     isosorbide  mononitrate (IMDUR ) 30 MG 24 hr tablet Take 1 tablet (30 mg total) by mouth daily. 90 tablet 1   lactose free nutrition (BOOST) LIQD Take 237 mLs by mouth 2 (two) times daily between meals. At least one a day and sometimes more     levothyroxine  (SYNTHROID ) 75 MCG tablet Take 1 tablet (75 mcg total) by mouth daily. 90 tablet 1   linaclotide  (LINZESS ) 145 MCG CAPS capsule Take 1 capsule (145 mcg total) by mouth daily. 90 capsule 1   melatonin 5 MG TABS Take 5 mg by mouth at bedtime.     memantine  (NAMENDA ) 5 MG tablet Take 1 tablet (5 mg total) by mouth 2 (two) times daily. 180 tablet 3   metoprolol  tartrate (LOPRESSOR ) 25 MG tablet Take 0.5 tablets (12.5 mg total) by mouth 2 (two) times daily. 30 tablet 2   Multiple Vitamin (MULTIVITAMIN) tablet Take 1 tablet by mouth at bedtime.     nitroGLYCERIN  (NITROSTAT ) 0.4 MG SL tablet Place 1 tablet (0.4 mg total) under the tongue every 5 (five) minutes as needed for chest pain. Up to 3 doses in one 24 hour period 45 tablet 3   OVER THE COUNTER MEDICATION Take 1 tablet by mouth at bedtime. magnesium     pantoprazole  (PROTONIX ) 40 MG tablet Take 1 tablet (40 mg total) by mouth daily. 90 tablet 3   PARoxetine  (PAXIL ) 40 MG tablet Take 40 mg by mouth every morning.     QUEtiapine (SEROQUEL) 50 MG tablet Take 50 mg by mouth at bedtime.     rosuvastatin  (CRESTOR ) 20 MG tablet Take 1 tablet (20 mg total) by mouth at bedtime. 30 tablet 2   No current facility-administered  medications on file prior to visit.        ROS:  All others reviewed and negative.  Objective        PE:  BP 122/70 (BP Location: Right Arm, Patient Position: Sitting, Cuff Size: Normal)   Pulse 62   Temp 98.9 F (37.2 C) (Oral)   Ht 5\' 2"  (1.575 m)  SpO2 94%   BMI 25.61 kg/m                 Constitutional: Pt appears in NAD               HENT: Head: NCAT.                Right Ear: External ear normal.                 Left Ear: External ear normal.                Eyes: . Pupils are equal, round, and reactive to light. Conjunctivae and EOM are normal               Nose: without d/c or deformity               Neck: Neck supple. Gross normal ROM               Cardiovascular: Normal rate and regular rhythm.                 Pulmonary/Chest: Effort normal and breath sounds without rales or wheezing.                Abd:  Soft, NT, ND, + BS, no organomegaly               Neurological: Pt is alert. At baseline orientation, motor grossly intact               Skin: Skin is warm. No rashes, no other new lesions, LE edema - none               Psychiatric: Pt behavior is normal without agitation   Micro: none  Cardiac tracings I have personally interpreted today:  none  Pertinent Radiological findings (summarize): none   Lab Results  Component Value Date   WBC 6.7 10/13/2023   HGB 14.6 10/13/2023   HCT 44.8 10/13/2023   PLT 208 10/13/2023   GLUCOSE 105 (H) 08/17/2023   CHOL 146 08/03/2023   TRIG 92 08/03/2023   HDL 39 (L) 08/03/2023   LDLDIRECT 137.5 10/19/2012   LDLCALC 89 08/03/2023   ALT 14 08/03/2023   AST 40 08/03/2023   NA 144 08/17/2023   K 4.8 08/17/2023   CL 106 08/17/2023   CREATININE 0.80 08/17/2023   BUN 22 08/17/2023   CO2 23 08/17/2023   TSH 4.850 (H) 01/01/2024   INR 0.9 08/02/2023   HGBA1C 6.3 04/10/2023   Assessment/Plan:  Susan Davidson is a 73 y.o. White or Caucasian [1] female with  has a past medical history of ANXIETY (02/12/2008), CVA (cerebral  vascular accident) (HCC), DEPRESSION (02/12/2008), HYPERLIPIDEMIA (02/12/2008), HYPERTENSION (02/12/2008), HYPOTHYROIDISM (02/12/2008), Impaired glucose tolerance (08/27/2011), Left hemiparesis (HCC), OSTEOPENIA (02/12/2008), and VITAMIN D  DEFICIENCY (04/14/2010).  Gross hematuria Minor it seems but several times noted by aide in the home; has had low energy as well, unable to give specimen today; ok for cephalexin  course but would need urology for persistent bleeding  Encounter for well adult exam with abnormal findings Age and sex appropriate education and counseling updated with regular exercise and diet Referrals for preventative services - declines mammogram or colonoscopy Immunizations addressed - for tdap and shingrix at pharmacy Smoking counseling  - none needed Evidence for depression or other mood disorder - none significant Most recent labs reviewed. I have personally reviewed and have noted: 1) the patient's medical and social history 2)  The patient's current medications and supplements 3) The patient's height, weight, and BMI have been recorded in the chart   Dementia without behavioral disturbance (HCC) Overall stable, cont current med tx  Acquired hypothyroidism Lab Results  Component Value Date   TSH 4.850 (H) 01/01/2024   Stable, pt to continue levothyroxine  75 mcg qd   Vitamin D  deficiency Last vitamin D  Lab Results  Component Value Date   VD25OH 84.27 04/10/2023   Stable, cont oral replacement   HLD (hyperlipidemia) Lab Results  Component Value Date   LDLCALC 89 08/03/2023   uncontrolled, pt to continue current statin crestor  20 every day, lower chol diet and f/u lab today,    Essential hypertension BP Readings from Last 3 Encounters:  02/01/24 122/70  01/30/24 (!) 122/57  01/02/24 124/76   Stable, pt to continue medical treatment lopressor  12.5 bid   Impaired glucose tolerance Lab Results  Component Value Date   HGBA1C 6.3 04/10/2023    Stable, pt to continue current medical treatment  - diet, wt control   B12 deficiency Lab Results  Component Value Date   VITAMINB12 1,019 (H) 08/03/2023   Stable, cont oral replacement - b12 1000 mcg qd   CKD (chronic kidney disease) stage 3, GFR 30-59 ml/min (HCC) Lab Results  Component Value Date   CREATININE 0.80 08/17/2023   Stable overall, cont to avoid nephrotoxins  Followup: Return in about 6 months (around 08/03/2024).  Rosalia Colonel, MD 02/01/2024 7:42 PM [facility]

## 2024-02-01 NOTE — Assessment & Plan Note (Signed)
 Lab Results  Component Value Date   LDLCALC 89 08/03/2023   uncontrolled, pt to continue current statin crestor  20 every day, lower chol diet and f/u lab today,

## 2024-02-01 NOTE — Assessment & Plan Note (Signed)
 Age and sex appropriate education and counseling updated with regular exercise and diet Referrals for preventative services - declines mammogram or colonoscopy Immunizations addressed - for tdap and shingrix at pharmacy Smoking counseling  - none needed Evidence for depression or other mood disorder - none significant Most recent labs reviewed. I have personally reviewed and have noted: 1) the patient's medical and social history 2) The patient's current medications and supplements 3) The patient's height, weight, and BMI have been recorded in the chart

## 2024-02-01 NOTE — Assessment & Plan Note (Signed)
 Lab Results  Component Value Date   CREATININE 0.80 08/17/2023   Stable overall, cont to avoid nephrotoxins

## 2024-02-01 NOTE — Assessment & Plan Note (Signed)
 BP Readings from Last 3 Encounters:  02/01/24 122/70  01/30/24 (!) 122/57  01/02/24 124/76   Stable, pt to continue medical treatment lopressor  12.5 bid

## 2024-02-01 NOTE — Assessment & Plan Note (Signed)
 Lab Results  Component Value Date   VITAMINB12 1,019 (H) 08/03/2023   Stable, cont oral replacement - b12 1000 mcg qd

## 2024-02-01 NOTE — Assessment & Plan Note (Signed)
 Minor it seems but several times noted by aide in the home; has had low energy as well, unable to give specimen today; ok for cephalexin  course but would need urology for persistent bleeding

## 2024-02-01 NOTE — Assessment & Plan Note (Signed)
Last vitamin D Lab Results  Component Value Date   VD25OH 84.27 04/10/2023   Stable, cont oral replacement

## 2024-02-01 NOTE — Patient Instructions (Addendum)
 Please have your Shingrix (shingles) shots done at your local pharmacy.  Please take all new medication as prescribed - the antibiotic  Please continue all other medications as before, and refills have been done if requested.  Please have the pharmacy call with any other refills you may need.  Please continue your efforts at being more active, low cholesterol diet  Please keep your appointments with your specialists as you may have planned  Please go to the LAB at the blood drawing area for the tests to be done  You will be contacted by phone if any changes need to be made immediately.  Otherwise, you will receive a letter about your results with an explanation, but please check with MyChart first.  Please make an Appointment to return in 6 months, or sooner if needed; ok to hold on July appointment

## 2024-02-01 NOTE — Assessment & Plan Note (Signed)
 Lab Results  Component Value Date   TSH 4.850 (H) 01/01/2024   Stable, pt to continue levothyroxine  75 mcg qd

## 2024-02-01 NOTE — Assessment & Plan Note (Signed)
Overall stable, cont current med tx 

## 2024-02-02 ENCOUNTER — Other Ambulatory Visit: Payer: Self-pay | Admitting: Internal Medicine

## 2024-02-02 ENCOUNTER — Encounter: Payer: Self-pay | Admitting: Internal Medicine

## 2024-02-02 LAB — HEMOGLOBIN A1C: Hgb A1c MFr Bld: 6.3 % (ref 4.6–6.5)

## 2024-02-02 MED ORDER — LEVOTHYROXINE SODIUM 100 MCG PO TABS
100.0000 ug | ORAL_TABLET | Freq: Every day | ORAL | 3 refills | Status: DC
Start: 2024-02-02 — End: 2024-06-20

## 2024-02-05 ENCOUNTER — Other Ambulatory Visit

## 2024-02-09 ENCOUNTER — Telehealth: Payer: Self-pay | Admitting: Cardiology

## 2024-02-09 NOTE — Telephone Encounter (Signed)
 Patient's spouse is calling to inform Katlyn patient had labs performed through PCP 05/01 that she can review.   Please advise.

## 2024-02-16 ENCOUNTER — Telehealth: Payer: Self-pay | Admitting: *Deleted

## 2024-02-16 NOTE — Telephone Encounter (Signed)
 I think it is ok.  He did not check Free T4, which is something I always check to determine if a dosage change is needed.  Just make sure they know about possible side effects that may mean that 100 mcg dose is too hight (tremors, heart palpitations, anxiety, irritability, weight loss, diarrhea, etc).

## 2024-02-16 NOTE — Telephone Encounter (Signed)
 Patient's husband left a message with Access Nurse, and also a telephone message here. His wife saw her PCP and lab work was done. Dr. Autry Legions increased her thyroid  medication to 100 mcg, Susan Davidson has her on 75 mcg. He shares that [facility] has sent the prescription for the 100 mcg. Patient's husband would like for Susan Davidson to review the Lab, and let him know what dose Susan Davidson should take.

## 2024-02-19 NOTE — Telephone Encounter (Signed)
 I talked with the patient's husband sharing Susan Davidson's recommendation. He then ask what test did the PCP do to determine the change in dosage, I looked and it looks like a TSH was preformed.  Susan Davidson is asking that given the difference in the results recently and one month ago, what does this change in the TSH mean? Hould she take the 100 mcg or stay on the current dose that Susan Davidson has prescribed.

## 2024-02-20 NOTE — Telephone Encounter (Signed)
 Called and talked with the patient's husband, he will start her on the 100 mcg of Thyroid  Medication.

## 2024-02-20 NOTE — Telephone Encounter (Signed)
 Have her stay on the 100 mcg for now.  It is difficult to tell if it is a proper dose without the Free T4 level along with the TSH.  The TSH went up, which typically means under-replacement.

## 2024-02-23 ENCOUNTER — Other Ambulatory Visit: Payer: Self-pay | Admitting: Cardiology

## 2024-02-24 ENCOUNTER — Emergency Department (HOSPITAL_COMMUNITY)

## 2024-02-24 ENCOUNTER — Other Ambulatory Visit: Payer: Self-pay

## 2024-02-24 ENCOUNTER — Emergency Department (HOSPITAL_COMMUNITY)
Admission: EM | Admit: 2024-02-24 | Discharge: 2024-02-25 | Disposition: A | Discharge status: Home / Self Care | Source: Home / Self Care | Attending: Emergency Medicine | Admitting: Emergency Medicine

## 2024-02-24 ENCOUNTER — Encounter (HOSPITAL_COMMUNITY): Payer: Self-pay

## 2024-02-24 DIAGNOSIS — I700 Atherosclerosis of aorta: Secondary | ICD-10-CM | POA: Diagnosis not present

## 2024-02-24 DIAGNOSIS — R262 Difficulty in walking, not elsewhere classified: Secondary | ICD-10-CM | POA: Diagnosis not present

## 2024-02-24 DIAGNOSIS — I10 Essential (primary) hypertension: Secondary | ICD-10-CM | POA: Diagnosis not present

## 2024-02-24 DIAGNOSIS — F0390 Unspecified dementia without behavioral disturbance: Secondary | ICD-10-CM | POA: Insufficient documentation

## 2024-02-24 DIAGNOSIS — E785 Hyperlipidemia, unspecified: Secondary | ICD-10-CM | POA: Diagnosis present

## 2024-02-24 DIAGNOSIS — S42022A Displaced fracture of shaft of left clavicle, initial encounter for closed fracture: Secondary | ICD-10-CM | POA: Diagnosis not present

## 2024-02-24 DIAGNOSIS — Z7902 Long term (current) use of antithrombotics/antiplatelets: Secondary | ICD-10-CM | POA: Diagnosis not present

## 2024-02-24 DIAGNOSIS — F03C3 Unspecified dementia, severe, with mood disturbance: Secondary | ICD-10-CM | POA: Diagnosis present

## 2024-02-24 DIAGNOSIS — Z79899 Other long term (current) drug therapy: Secondary | ICD-10-CM | POA: Diagnosis not present

## 2024-02-24 DIAGNOSIS — R64 Cachexia: Secondary | ICD-10-CM | POA: Diagnosis present

## 2024-02-24 DIAGNOSIS — T17308A Unspecified foreign body in larynx causing other injury, initial encounter: Secondary | ICD-10-CM | POA: Diagnosis not present

## 2024-02-24 DIAGNOSIS — F03C18 Unspecified dementia, severe, with other behavioral disturbance: Secondary | ICD-10-CM | POA: Diagnosis present

## 2024-02-24 DIAGNOSIS — Z7982 Long term (current) use of aspirin: Secondary | ICD-10-CM | POA: Insufficient documentation

## 2024-02-24 DIAGNOSIS — T17998A Other foreign object in respiratory tract, part unspecified causing other injury, initial encounter: Secondary | ICD-10-CM | POA: Insufficient documentation

## 2024-02-24 DIAGNOSIS — I130 Hypertensive heart and chronic kidney disease with heart failure and stage 1 through stage 4 chronic kidney disease, or unspecified chronic kidney disease: Secondary | ICD-10-CM | POA: Diagnosis present

## 2024-02-24 DIAGNOSIS — J690 Pneumonitis due to inhalation of food and vomit: Secondary | ICD-10-CM | POA: Diagnosis present

## 2024-02-24 DIAGNOSIS — T17308D Unspecified foreign body in larynx causing other injury, subsequent encounter: Secondary | ICD-10-CM | POA: Diagnosis not present

## 2024-02-24 DIAGNOSIS — J9601 Acute respiratory failure with hypoxia: Secondary | ICD-10-CM | POA: Diagnosis present

## 2024-02-24 DIAGNOSIS — F03C4 Unspecified dementia, severe, with anxiety: Secondary | ICD-10-CM | POA: Diagnosis present

## 2024-02-24 DIAGNOSIS — Z8673 Personal history of transient ischemic attack (TIA), and cerebral infarction without residual deficits: Secondary | ICD-10-CM | POA: Diagnosis not present

## 2024-02-24 DIAGNOSIS — F319 Bipolar disorder, unspecified: Secondary | ICD-10-CM | POA: Diagnosis present

## 2024-02-24 DIAGNOSIS — R1310 Dysphagia, unspecified: Secondary | ICD-10-CM | POA: Diagnosis present

## 2024-02-24 DIAGNOSIS — N1831 Chronic kidney disease, stage 3a: Secondary | ICD-10-CM | POA: Diagnosis present

## 2024-02-24 DIAGNOSIS — Z79890 Hormone replacement therapy (postmenopausal): Secondary | ICD-10-CM | POA: Diagnosis not present

## 2024-02-24 DIAGNOSIS — I5032 Chronic diastolic (congestive) heart failure: Secondary | ICD-10-CM | POA: Diagnosis present

## 2024-02-24 DIAGNOSIS — R059 Cough, unspecified: Secondary | ICD-10-CM | POA: Diagnosis not present

## 2024-02-24 DIAGNOSIS — W44F3XA Food entering into or through a natural orifice, initial encounter: Secondary | ICD-10-CM | POA: Insufficient documentation

## 2024-02-24 DIAGNOSIS — Z7901 Long term (current) use of anticoagulants: Secondary | ICD-10-CM | POA: Insufficient documentation

## 2024-02-24 DIAGNOSIS — I252 Old myocardial infarction: Secondary | ICD-10-CM | POA: Diagnosis not present

## 2024-02-24 DIAGNOSIS — F411 Generalized anxiety disorder: Secondary | ICD-10-CM | POA: Diagnosis present

## 2024-02-24 DIAGNOSIS — T17908A Unspecified foreign body in respiratory tract, part unspecified causing other injury, initial encounter: Secondary | ICD-10-CM

## 2024-02-24 DIAGNOSIS — R0989 Other specified symptoms and signs involving the circulatory and respiratory systems: Secondary | ICD-10-CM | POA: Diagnosis not present

## 2024-02-24 DIAGNOSIS — I2510 Atherosclerotic heart disease of native coronary artery without angina pectoris: Secondary | ICD-10-CM | POA: Diagnosis present

## 2024-02-24 DIAGNOSIS — Z7189 Other specified counseling: Secondary | ICD-10-CM | POA: Diagnosis not present

## 2024-02-24 DIAGNOSIS — E039 Hypothyroidism, unspecified: Secondary | ICD-10-CM | POA: Diagnosis present

## 2024-02-24 DIAGNOSIS — R0600 Dyspnea, unspecified: Secondary | ICD-10-CM | POA: Insufficient documentation

## 2024-02-24 DIAGNOSIS — R0602 Shortness of breath: Secondary | ICD-10-CM | POA: Diagnosis not present

## 2024-02-24 DIAGNOSIS — Z66 Do not resuscitate: Secondary | ICD-10-CM | POA: Diagnosis present

## 2024-02-24 DIAGNOSIS — Z515 Encounter for palliative care: Secondary | ICD-10-CM | POA: Diagnosis not present

## 2024-02-24 DIAGNOSIS — I69354 Hemiplegia and hemiparesis following cerebral infarction affecting left non-dominant side: Secondary | ICD-10-CM | POA: Diagnosis not present

## 2024-02-24 DIAGNOSIS — Z7983 Long term (current) use of bisphosphonates: Secondary | ICD-10-CM | POA: Diagnosis not present

## 2024-02-24 DIAGNOSIS — R0902 Hypoxemia: Secondary | ICD-10-CM | POA: Diagnosis not present

## 2024-02-24 DIAGNOSIS — R410 Disorientation, unspecified: Secondary | ICD-10-CM | POA: Diagnosis not present

## 2024-02-24 NOTE — ED Triage Notes (Signed)
 Pt son called [facility] for "choking". Upon ems arrival pt was was 80% on room air. Placed on 4L O2. Hx of dementia. Wet cough and voice present.

## 2024-02-24 NOTE — ED Provider Notes (Signed)
 [facility] AT [facility]  Provider Note  CSN: [id number] Arrival date & time: 02/24/24 2330  History Chief Complaint  Patient presents with  . Hypoxia     Susan Davidson is a 73 y.o. female brought to the ED via EMS from home for hypoxia. They were initially called out for possible choking, apparently the patient had been having trouble breathing since taking evening meds. They found her SpO2 to be 80% on RA. They started supplemental oxygen and brought her to the ED. Patient has history of dementia and unable to give many other details. Husband is not yet at bedside, phone goes to voicemail. There was no reported fever.    Home Medications Prior to Admission medications   Medication Sig Start Date End Date Taking? Authorizing Provider  levothyroxine  (SYNTHROID ) 100 MCG tablet Take 1 tablet (100 mcg total) by mouth daily. 02/02/24   Roslyn Coombe, MD  acetaminophen  (TYLENOL ) 325 MG tablet Take 2 tablets (650 mg total) by mouth every 6 (six) hours as needed for mild pain or headache (fever >/= 101). 08/04/21   Colin Dawley, MD  albuterol  (VENTOLIN  HFA) 108 (90 Base) MCG/ACT inhaler Inhale 2 puffs into the lungs every 6 (six) hours as needed for wheezing or shortness of breath. 05/19/23   Colin Dawley, MD  alendronate  (FOSAMAX ) 70 MG tablet Take 1 tablet (70 mg total) by mouth every 7 (seven) days. Take with a full glass of water on an empty stomach. 05/12/23   Roslyn Coombe, MD  ALPRAZolam  (XANAX ) 0.5 MG tablet TAKE 1 TABLET TWICE DAILY AS NEEDED FOR ANXIETY 01/11/24   Roslyn Coombe, MD  ascorbic acid  (VITAMIN C) 500 MG tablet Take 1 tablet (500 mg total) by mouth daily. Patient taking differently: Take 500 mg by mouth at bedtime. 08/05/21   Colin Dawley, MD  aspirin  EC 81 MG tablet Take 1 tablet (81 mg total) by mouth daily. Swallow whole. 08/06/23   Etter Hermann., MD  cephALEXin  (KEFLEX ) 500 MG capsule Take 1 capsule (500 mg total) by mouth 3  (three) times daily. 02/01/24   Roslyn Coombe, MD  Cholecalciferol (VITAMIN D -3 PO) Take 1 capsule by mouth at bedtime.    [provider]  clopidogrel  (PLAVIX ) 75 MG tablet Take 1 tablet (75 mg total) by mouth daily. 06/08/23   Roslyn Coombe, MD  Coenzyme Q10 (COQ10 PO) Take 1 tablet by mouth at bedtime.    [provider]  divalproex  (DEPAKOTE ) 250 MG DR tablet Take 1 tablet by mouth See admin instructions. 1 tablet in the morning and 2 tablets every evening 07/28/21   [provider]  folic acid  (FOLVITE ) 800 MCG tablet Take 400 mcg by mouth at bedtime.    [provider]  isosorbide  mononitrate (IMDUR ) 30 MG 24 hr tablet Take 1 tablet (30 mg total) by mouth daily. 12/07/23   West, Katlyn D, NP  lactose free nutrition (BOOST) LIQD Take 237 mLs by mouth 2 (two) times daily between meals. At least one a day and sometimes more    [provider]  linaclotide  (LINZESS ) 145 MCG CAPS capsule Take 1 capsule (145 mcg total) by mouth daily. 09/29/23   Ahmed, Muhammad F, MD  melatonin 5 MG TABS Take 5 mg by mouth at bedtime.    [provider]  memantine  (NAMENDA ) 5 MG tablet Take 1 tablet (5 mg total) by mouth 2 (two) times daily. 06/08/23   Rosalia Colonel  W, MD  metoprolol  tartrate (LOPRESSOR ) 25 MG tablet TAKE 1/2 TABLET TWICE DAILY 02/23/24   West, Katlyn D, NP  Multiple Vitamin (MULTIVITAMIN) tablet Take 1 tablet by mouth at bedtime.    [provider]  nitroGLYCERIN  (NITROSTAT ) 0.4 MG SL tablet Place 1 tablet (0.4 mg total) under the tongue every 5 (five) minutes as needed for chest pain. Up to 3 doses in one 24 hour period 01/02/24   West, Katlyn D, NP  OVER THE COUNTER MEDICATION Take 1 tablet by mouth at bedtime. magnesium    [provider]  pantoprazole  (PROTONIX ) 40 MG tablet Take 1 tablet (40 mg total) by mouth daily. 06/08/23   Roslyn Coombe, MD  PARoxetine  (PAXIL ) 40 MG tablet Take 40 mg by mouth every morning. 01/01/22   [provider]  QUEtiapine (SEROQUEL) 50 MG tablet Take 50 mg by mouth at bedtime. 01/24/24   [provider]  rosuvastatin  (CRESTOR ) 20 MG tablet TAKE 1 TABLET AT BEDTIME 02/23/24   West, Katlyn D, NP     Allergies    Aleve [naproxen], Fire ant (solenopsis [place]), Covid-19 mrna vaccine Proofreader) [covid-19 mrna vacc (moderna)], and Influenza vac split quad   Review of Systems   Review of Systems Please see HPI for pertinent positives and negatives  Physical Exam BP 123/61   Pulse 69   Temp 98.1 F (36.7 C) (Axillary)   Resp 13   Ht 5\' 2"  (1.575 m)   Wt 62.7 kg   SpO2 94%   BMI 25.30 kg/m   Physical Exam Vitals and nursing note reviewed.  Constitutional:      Appearance: Normal appearance.  HENT:     Head: Normocephalic and atraumatic.     Nose: Nose normal.     Mouth/Throat:     Mouth: Mucous membranes are moist.  Eyes:     Extraocular Movements: Extraocular movements intact.     Conjunctiva/sclera: Conjunctivae normal.  Cardiovascular:     Rate and Rhythm: Normal rate.  Pulmonary:     Effort: Pulmonary effort is normal.     Breath sounds: Rhonchi and rales present.  Abdominal:     General: Abdomen is flat.     Palpations: Abdomen is soft.     Tenderness: There is no abdominal tenderness.  Musculoskeletal:        General: No swelling. Normal range of motion.     Cervical back: Neck supple.     Right lower leg: No edema.     Left lower leg: No edema.  Skin:    General: Skin is warm and dry.  Neurological:     General: No focal deficit present.     Mental Status: She is alert.  Psychiatric:        Mood and Affect: Mood normal.     ED Results / Procedures / Treatments   EKG EKG Interpretation Date/Time:  Saturday Feb 24 2024 23:53:02 EDT Ventricular Rate:  71 PR Interval:  66 QRS Duration:  89 QT Interval:  416 QTC Calculation: 456 R Axis:   -60  Text Interpretation: Sinus rhythm Short PR interval Low voltage, extremity and precordial  leads Abnormal R-wave progression, early transition Abnormal inferior Q waves No significant change since last tracing Confirmed by Shawnee Dellen [id number]) on 02/24/2024 11:55:15 PM  Procedures Procedures  Medications Ordered in the ED Medications - No data to display  Initial Impression and Plan  Patient here with possible choking spell at home and hypoxic with EMS. SpO2  is improved on RA here, will continue to monitor closely. Check labs, EKG and CXR.   ED Course   Clinical Course as of 02/25/24 0250  Paulene Boron Feb 25, 2024  0011 Husband at bedside now elaborates on history. He confirms she seemed to choke while taking her medications around 4pm which is not usual, but she has been unable to clear secretions since then. EMS was called earlier in the day and did not transport. They were called back tonight and while they reported an SpO2 of 80% he states the next check was 94% and he suspects it was not really low. This is similar to our findings here.  [CS]  0024 CBC is normal. I personally viewed the images from radiology studies and agree with radiologist interpretation: CXR is clear  [CS]  0057 BMP, Trop and BNP are unremarkable. Will ask RT to try suctioning her oropharynx to help her clear her secretions.  [CS]  0150 Patient suctioned with improved symptoms, seems to have less wet sounding upper airway noise.  [CS]  0157 Delta trop is flat. [CS]  0250 Patient continues to rest comfortably, husband is comfortable taking her home. Given information for dysphagia. Recommend PCP follow up, RTED for any other concerns.   [CS]    Clinical Course User Index [CS] Charmayne Cooper, MD     MDM Rules/Calculators/A&P Medical Decision Making Problems Addressed: Aspiration into airway, initial encounter: acute illness or injury Dysphagia, unspecified type: acute illness or injury  Amount and/or Complexity of Data Reviewed Labs: ordered. Decision-making details documented in ED  Course. Radiology: ordered and independent interpretation performed. Decision-making details documented in ED Course. ECG/medicine tests: ordered and independent interpretation performed. Decision-making details documented in ED Course.     Final Clinical Impression(s) / ED Diagnoses Final diagnoses:  Aspiration into airway, initial encounter  Dysphagia, unspecified type    Rx / DC Orders ED Discharge Orders     None        Charmayne Cooper, MD 02/25/24 [phone]

## 2024-02-24 NOTE — ED Notes (Signed)
 Portable xray at bedside.

## 2024-02-25 ENCOUNTER — Emergency Department (HOSPITAL_COMMUNITY)

## 2024-02-25 ENCOUNTER — Other Ambulatory Visit: Payer: Self-pay

## 2024-02-25 ENCOUNTER — Inpatient Hospital Stay (HOSPITAL_COMMUNITY)
Admission: EM | Admit: 2024-02-25 | Discharge: 2024-02-28 | DRG: 177 | Disposition: A | Discharge status: Home Health | Attending: Internal Medicine | Admitting: Internal Medicine

## 2024-02-25 ENCOUNTER — Encounter (HOSPITAL_COMMUNITY): Payer: Self-pay

## 2024-02-25 DIAGNOSIS — F0390 Unspecified dementia without behavioral disturbance: Secondary | ICD-10-CM | POA: Diagnosis not present

## 2024-02-25 DIAGNOSIS — Z7983 Long term (current) use of bisphosphonates: Secondary | ICD-10-CM

## 2024-02-25 DIAGNOSIS — R1310 Dysphagia, unspecified: Secondary | ICD-10-CM | POA: Diagnosis present

## 2024-02-25 DIAGNOSIS — I252 Old myocardial infarction: Secondary | ICD-10-CM

## 2024-02-25 DIAGNOSIS — T17308D Unspecified foreign body in larynx causing other injury, subsequent encounter: Secondary | ICD-10-CM | POA: Diagnosis not present

## 2024-02-25 DIAGNOSIS — Z515 Encounter for palliative care: Secondary | ICD-10-CM | POA: Diagnosis not present

## 2024-02-25 DIAGNOSIS — Z66 Do not resuscitate: Secondary | ICD-10-CM | POA: Diagnosis present

## 2024-02-25 DIAGNOSIS — F319 Bipolar disorder, unspecified: Secondary | ICD-10-CM | POA: Diagnosis present

## 2024-02-25 DIAGNOSIS — R29898 Other symptoms and signs involving the musculoskeletal system: Secondary | ICD-10-CM | POA: Diagnosis present

## 2024-02-25 DIAGNOSIS — E785 Hyperlipidemia, unspecified: Secondary | ICD-10-CM | POA: Diagnosis present

## 2024-02-25 DIAGNOSIS — S42022A Displaced fracture of shaft of left clavicle, initial encounter for closed fracture: Secondary | ICD-10-CM | POA: Diagnosis not present

## 2024-02-25 DIAGNOSIS — E039 Hypothyroidism, unspecified: Secondary | ICD-10-CM | POA: Diagnosis present

## 2024-02-25 DIAGNOSIS — Z887 Allergy status to serum and vaccine status: Secondary | ICD-10-CM

## 2024-02-25 DIAGNOSIS — Z79899 Other long term (current) drug therapy: Secondary | ICD-10-CM

## 2024-02-25 DIAGNOSIS — I10 Essential (primary) hypertension: Secondary | ICD-10-CM | POA: Diagnosis present

## 2024-02-25 DIAGNOSIS — Z7902 Long term (current) use of antithrombotics/antiplatelets: Secondary | ICD-10-CM | POA: Diagnosis not present

## 2024-02-25 DIAGNOSIS — N1831 Chronic kidney disease, stage 3a: Secondary | ICD-10-CM | POA: Diagnosis present

## 2024-02-25 DIAGNOSIS — T17308A Unspecified foreign body in larynx causing other injury, initial encounter: Secondary | ICD-10-CM | POA: Diagnosis present

## 2024-02-25 DIAGNOSIS — T17908A Unspecified foreign body in respiratory tract, part unspecified causing other injury, initial encounter: Secondary | ICD-10-CM | POA: Diagnosis not present

## 2024-02-25 DIAGNOSIS — R64 Cachexia: Secondary | ICD-10-CM | POA: Diagnosis present

## 2024-02-25 DIAGNOSIS — Z79890 Hormone replacement therapy (postmenopausal): Secondary | ICD-10-CM

## 2024-02-25 DIAGNOSIS — K5904 Chronic idiopathic constipation: Secondary | ICD-10-CM | POA: Diagnosis present

## 2024-02-25 DIAGNOSIS — M8580 Other specified disorders of bone density and structure, unspecified site: Secondary | ICD-10-CM | POA: Diagnosis present

## 2024-02-25 DIAGNOSIS — G4710 Hypersomnia, unspecified: Secondary | ICD-10-CM | POA: Diagnosis present

## 2024-02-25 DIAGNOSIS — I69354 Hemiplegia and hemiparesis following cerebral infarction affecting left non-dominant side: Secondary | ICD-10-CM

## 2024-02-25 DIAGNOSIS — F411 Generalized anxiety disorder: Secondary | ICD-10-CM | POA: Diagnosis present

## 2024-02-25 DIAGNOSIS — F03C4 Unspecified dementia, severe, with anxiety: Secondary | ICD-10-CM | POA: Diagnosis present

## 2024-02-25 DIAGNOSIS — F03C3 Unspecified dementia, severe, with mood disturbance: Secondary | ICD-10-CM | POA: Diagnosis present

## 2024-02-25 DIAGNOSIS — I5032 Chronic diastolic (congestive) heart failure: Secondary | ICD-10-CM | POA: Diagnosis present

## 2024-02-25 DIAGNOSIS — H9193 Unspecified hearing loss, bilateral: Secondary | ICD-10-CM | POA: Diagnosis present

## 2024-02-25 DIAGNOSIS — N189 Chronic kidney disease, unspecified: Secondary | ICD-10-CM | POA: Diagnosis present

## 2024-02-25 DIAGNOSIS — J9601 Acute respiratory failure with hypoxia: Secondary | ICD-10-CM | POA: Diagnosis present

## 2024-02-25 DIAGNOSIS — R400 Somnolence: Secondary | ICD-10-CM | POA: Diagnosis present

## 2024-02-25 DIAGNOSIS — J690 Pneumonitis due to inhalation of food and vomit: Secondary | ICD-10-CM | POA: Diagnosis present

## 2024-02-25 DIAGNOSIS — I2510 Atherosclerotic heart disease of native coronary artery without angina pectoris: Secondary | ICD-10-CM | POA: Diagnosis present

## 2024-02-25 DIAGNOSIS — Z8673 Personal history of transient ischemic attack (TIA), and cerebral infarction without residual deficits: Secondary | ICD-10-CM

## 2024-02-25 DIAGNOSIS — I130 Hypertensive heart and chronic kidney disease with heart failure and stage 1 through stage 4 chronic kidney disease, or unspecified chronic kidney disease: Secondary | ICD-10-CM | POA: Diagnosis present

## 2024-02-25 DIAGNOSIS — Z7189 Other specified counseling: Secondary | ICD-10-CM | POA: Diagnosis not present

## 2024-02-25 DIAGNOSIS — Z886 Allergy status to analgesic agent status: Secondary | ICD-10-CM

## 2024-02-25 DIAGNOSIS — F03C18 Unspecified dementia, severe, with other behavioral disturbance: Secondary | ICD-10-CM | POA: Diagnosis present

## 2024-02-25 DIAGNOSIS — R410 Disorientation, unspecified: Secondary | ICD-10-CM | POA: Diagnosis present

## 2024-02-25 DIAGNOSIS — Z7982 Long term (current) use of aspirin: Secondary | ICD-10-CM | POA: Diagnosis not present

## 2024-02-25 DIAGNOSIS — N183 Chronic kidney disease, stage 3 unspecified: Secondary | ICD-10-CM | POA: Diagnosis present

## 2024-02-25 DIAGNOSIS — Z818 Family history of other mental and behavioral disorders: Secondary | ICD-10-CM

## 2024-02-25 DIAGNOSIS — Z87891 Personal history of nicotine dependence: Secondary | ICD-10-CM

## 2024-02-25 DIAGNOSIS — Z8249 Family history of ischemic heart disease and other diseases of the circulatory system: Secondary | ICD-10-CM

## 2024-02-25 DIAGNOSIS — Z6825 Body mass index (BMI) 25.0-25.9, adult: Secondary | ICD-10-CM

## 2024-02-25 DIAGNOSIS — R0989 Other specified symptoms and signs involving the circulatory and respiratory systems: Secondary | ICD-10-CM | POA: Diagnosis not present

## 2024-02-25 DIAGNOSIS — R262 Difficulty in walking, not elsewhere classified: Secondary | ICD-10-CM | POA: Diagnosis present

## 2024-02-25 DIAGNOSIS — I729 Aneurysm of unspecified site: Secondary | ICD-10-CM | POA: Diagnosis present

## 2024-02-25 DIAGNOSIS — Z9109 Other allergy status, other than to drugs and biological substances: Secondary | ICD-10-CM

## 2024-02-25 DIAGNOSIS — F32A Depression, unspecified: Secondary | ICD-10-CM | POA: Diagnosis present

## 2024-02-25 DIAGNOSIS — Z604 Social exclusion and rejection: Secondary | ICD-10-CM | POA: Diagnosis present

## 2024-02-25 LAB — CBC WITH DIFFERENTIAL/PLATELET
Abs Immature Granulocytes: 0.02 10*3/uL (ref 0.00–0.07)
Basophils Absolute: 0.1 10*3/uL (ref 0.0–0.1)
Basophils Relative: 1 %
Eosinophils Absolute: 0.1 10*3/uL (ref 0.0–0.5)
Eosinophils Relative: 1 %
HCT: 43.6 % (ref 36.0–46.0)
Hemoglobin: 14.2 g/dL (ref 12.0–15.0)
Immature Granulocytes: 0 %
Lymphocytes Relative: 28 %
Lymphs Abs: 2.2 10*3/uL (ref 0.7–4.0)
MCH: 33.5 pg (ref 26.0–34.0)
MCHC: 32.6 g/dL (ref 30.0–36.0)
MCV: 102.8 fL — ABNORMAL HIGH (ref 80.0–100.0)
Monocytes Absolute: 0.5 10*3/uL (ref 0.1–1.0)
Monocytes Relative: 7 %
Neutro Abs: 4.8 10*3/uL (ref 1.7–7.7)
Neutrophils Relative %: 63 %
Platelets: 164 10*3/uL (ref 150–400)
RBC: 4.24 MIL/uL (ref 3.87–5.11)
RDW: 15.5 % (ref 11.5–15.5)
WBC: 7.6 10*3/uL (ref 4.0–10.5)
nRBC: 0 % (ref 0.0–0.2)

## 2024-02-25 LAB — BASIC METABOLIC PANEL WITH GFR
Anion gap: 5 (ref 5–15)
BUN: 21 mg/dL (ref 8–23)
CO2: 24 mmol/L (ref 22–32)
Calcium: 8.7 mg/dL — ABNORMAL LOW (ref 8.9–10.3)
Chloride: 108 mmol/L (ref 98–111)
Creatinine, Ser: 0.92 mg/dL (ref 0.44–1.00)
GFR, Estimated: >60 mL/min (ref >60)
Glucose, Bld: 120 mg/dL — ABNORMAL HIGH (ref 70–99)
Potassium: 3.8 mmol/L (ref 3.5–5.1)
Sodium: 137 mmol/L (ref 135–145)

## 2024-02-25 LAB — MRSA NEXT GEN BY PCR, NASAL: MRSA by PCR Next Gen: NOT DETECTED

## 2024-02-25 LAB — BRAIN NATRIURETIC PEPTIDE: B Natriuretic Peptide: 227 pg/mL — ABNORMAL HIGH (ref 0.0–100.0)

## 2024-02-25 LAB — TROPONIN I (HIGH SENSITIVITY)
Troponin I (High Sensitivity): 19 ng/L — ABNORMAL HIGH (ref <18)
Troponin I (High Sensitivity): 19 ng/L — ABNORMAL HIGH (ref <18)

## 2024-02-25 MED ORDER — ACETAMINOPHEN 650 MG RE SUPP: 650.0000 mg | Freq: Four times a day (QID) | RECTAL | Status: DC | PRN

## 2024-02-25 MED ORDER — ACETAMINOPHEN 325 MG PO TABS: 650.0000 mg | ORAL_TABLET | Freq: Four times a day (QID) | ORAL | Status: DC | PRN

## 2024-02-25 MED ORDER — ONDANSETRON HCL 4 MG/2ML IJ SOLN: 4.0000 mg | Freq: Four times a day (QID) | INTRAMUSCULAR | Status: DC | PRN

## 2024-02-25 MED ORDER — SODIUM CHLORIDE 0.9 % IV SOLN
3.0000 g | Freq: Once | INTRAVENOUS | Status: AC
  Administered 2024-02-25: 3 g via INTRAVENOUS
  Filled 2024-02-25: qty 8

## 2024-02-25 MED ORDER — ENOXAPARIN SODIUM 40 MG/0.4ML IJ SOSY
40.0000 mg | PREFILLED_SYRINGE | INTRAMUSCULAR | Status: DC
  Administered 2024-02-25 – 2024-02-27 (×3): 40 mg via SUBCUTANEOUS
  Filled 2024-02-25 (×3): qty 0.4

## 2024-02-25 MED ORDER — SODIUM CHLORIDE 0.9 % IV SOLN
3.0000 g | Freq: Three times a day (TID) | INTRAVENOUS | Status: AC
  Administered 2024-02-25 – 2024-02-26 (×5): 3 g via INTRAVENOUS
  Filled 2024-02-25: qty 8
  Filled 2024-02-25 (×4): qty 3

## 2024-02-25 MED ORDER — CHLORHEXIDINE GLUCONATE CLOTH 2 % EX PADS
6.0000 | MEDICATED_PAD | Freq: Every day | CUTANEOUS | Status: DC
  Administered 2024-02-25 – 2024-02-27 (×3): 6 via TOPICAL

## 2024-02-25 MED ORDER — ACETAMINOPHEN 10 MG/ML IV SOLN
1000.0000 mg | Freq: Four times a day (QID) | INTRAVENOUS | Status: AC | PRN
  Administered 2024-02-25: 1000 mg via INTRAVENOUS
  Filled 2024-02-25: qty 100

## 2024-02-25 MED ORDER — ONDANSETRON HCL 4 MG PO TABS: 4.0000 mg | ORAL_TABLET | Freq: Four times a day (QID) | ORAL | Status: DC | PRN

## 2024-02-25 MED ORDER — HYDRALAZINE HCL 20 MG/ML IJ SOLN
5.0000 mg | INTRAMUSCULAR | Status: DC | PRN
  Administered 2024-02-26: 5 mg via INTRAVENOUS
  Filled 2024-02-25: qty 1

## 2024-02-25 NOTE — ED Notes (Signed)
 Patient placed on 2L supplemental oxygen via nasal cannula d/t o2 sat 83% on room air.

## 2024-02-25 NOTE — ED Notes (Signed)
 Pt has been yelling for help all morning, when anyone goes into the room to ask her what is wrong she states "nothing I'm okay" as soon as anyone walks out of the room she continues to yell.

## 2024-02-25 NOTE — ED Provider Notes (Signed)
 [place] EMERGENCY DEPARTMENT AT [facility]  Provider Note  CSN: [id number] Arrival date & time: 02/25/24 7829  History Chief Complaint  Patient presents with   Dysphagia    Susan Davidson is a 73 y.o. female brought to the ED by husband for re-evaluation of difficulty swallowing. She was seen during the night after having a choking/aspiration event while taking her evening medications yesterday. She had a negative workup and was suctioned several times before discharge. Husband reports she is still having difficulty handling her secretions and choking when trying to drink some water. She has not had a formal dysphagia evaluation in some time, but has been eating solid foods at home recently.    Home Medications Prior to Admission medications   Medication Sig Start Date End Date Taking? Authorizing Provider  levothyroxine  (SYNTHROID ) 100 MCG tablet Take 1 tablet (100 mcg total) by mouth daily. 02/02/24   Roslyn Coombe, MD  acetaminophen  (TYLENOL ) 325 MG tablet Take 2 tablets (650 mg total) by mouth every 6 (six) hours as needed for mild pain or headache (fever >/= 101). 08/04/21   Colin Dawley, MD  albuterol  (VENTOLIN  HFA) 108 (90 Base) MCG/ACT inhaler Inhale 2 puffs into the lungs every 6 (six) hours as needed for wheezing or shortness of breath. 05/19/23   Colin Dawley, MD  alendronate  (FOSAMAX ) 70 MG tablet Take 1 tablet (70 mg total) by mouth every 7 (seven) days. Take with a full glass of water on an empty stomach. 05/12/23   Roslyn Coombe, MD  ALPRAZolam  (XANAX ) 0.5 MG tablet TAKE 1 TABLET TWICE DAILY AS NEEDED FOR ANXIETY 01/11/24   Roslyn Coombe, MD  ascorbic acid  (VITAMIN C) 500 MG tablet Take 1 tablet (500 mg total) by mouth daily. Patient taking differently: Take 500 mg by mouth at bedtime. 08/05/21   Colin Dawley, MD  aspirin  EC 81 MG tablet Take 1 tablet (81 mg total) by mouth daily. Swallow whole. 08/06/23   Etter Hermann., MD  cephALEXin  (KEFLEX ) 500  MG capsule Take 1 capsule (500 mg total) by mouth 3 (three) times daily. 02/01/24   Roslyn Coombe, MD  Cholecalciferol (VITAMIN D -3 PO) Take 1 capsule by mouth at bedtime.    [provider]  clopidogrel  (PLAVIX ) 75 MG tablet Take 1 tablet (75 mg total) by mouth daily. 06/08/23   Roslyn Coombe, MD  Coenzyme Q10 (COQ10 PO) Take 1 tablet by mouth at bedtime.    [provider]  divalproex  (DEPAKOTE ) 250 MG DR tablet Take 1 tablet by mouth See admin instructions. 1 tablet in the morning and 2 tablets every evening 07/28/21   [provider]  folic acid  (FOLVITE ) 800 MCG tablet Take 400 mcg by mouth at bedtime.    [provider]  isosorbide  mononitrate (IMDUR ) 30 MG 24 hr tablet Take 1 tablet (30 mg total) by mouth daily. 12/07/23   West, Katlyn D, NP  lactose free nutrition (BOOST) LIQD Take 237 mLs by mouth 2 (two) times daily between meals. At least one a day and sometimes more    [provider]  linaclotide  (LINZESS ) 145 MCG CAPS capsule Take 1 capsule (145 mcg total) by mouth daily. 09/29/23   Ahmed, Muhammad F, MD  melatonin 5 MG TABS Take 5 mg by mouth at bedtime.    [provider]  memantine  (NAMENDA ) 5 MG tablet Take 1 tablet (5 mg total) by mouth 2 (two) times daily. 06/08/23   Autry Legions,  Alveda Aures, MD  metoprolol  tartrate (LOPRESSOR ) 25 MG tablet TAKE 1/2 TABLET TWICE DAILY 02/23/24   West, Katlyn D, NP  Multiple Vitamin (MULTIVITAMIN) tablet Take 1 tablet by mouth at bedtime.    [provider]  nitroGLYCERIN  (NITROSTAT ) 0.4 MG SL tablet Place 1 tablet (0.4 mg total) under the tongue every 5 (five) minutes as needed for chest pain. Up to 3 doses in one 24 hour period 01/02/24   West, Katlyn D, NP  OVER THE COUNTER MEDICATION Take 1 tablet by mouth at bedtime. magnesium    [provider]  pantoprazole  (PROTONIX ) 40 MG tablet Take 1 tablet (40 mg total) by mouth daily. 06/08/23   Roslyn Coombe, MD  PARoxetine  (PAXIL ) 40 MG tablet Take 40  mg by mouth every morning. 01/01/22   [provider]  QUEtiapine (SEROQUEL) 50 MG tablet Take 50 mg by mouth at bedtime. 01/24/24   [provider]  rosuvastatin  (CRESTOR ) 20 MG tablet TAKE 1 TABLET AT BEDTIME 02/23/24   West, Katlyn D, NP     Allergies    Aleve [naproxen], Fire ant (solenopsis [place]), Covid-19 mrna vaccine Proofreader) [covid-19 mrna vacc (moderna)], and Influenza vac split quad   Review of Systems   Review of Systems Please see HPI for pertinent positives and negatives  Physical Exam BP (!) 133/97   Pulse 90   Temp 98.2 F (36.8 C)   Resp 20   SpO2 94%   Physical Exam Vitals and nursing note reviewed.  Constitutional:      General: She is not in acute distress. HENT:     Head: Normocephalic.     Nose: Nose normal.  Eyes:     Extraocular Movements: Extraocular movements intact.  Cardiovascular:     Rate and Rhythm: Normal rate.  Pulmonary:     Effort: Pulmonary effort is normal.     Breath sounds: Rhonchi present.  Musculoskeletal:        General: Normal range of motion.     Cervical back: Neck supple.  Skin:    Findings: No rash (on exposed skin).  Neurological:     Mental Status: She is alert and oriented to person, place, and time.  Psychiatric:        Mood and Affect: Mood normal.     ED Results / Procedures / Treatments   EKG None  Procedures Procedures  Medications Ordered in the ED Medications - No data to display  Initial Impression and Plan  Patient here for re-evaluation of difficulty swallowing. Vitals are reassuring, no indication to repeat labs or imaging from a few hours ago. She had some improvement after suctioning. Will hold until day shift to see if Speech Therapy can do an evaluation on her and make recommendations.   ED Course   Clinical Course as of 02/25/24 8469  Paulene Boron Feb 25, 2024  0727 Patient signed out to Dr. Annabell Key at shift change.  [CS]    Clinical Course User Index [CS] Charmayne Cooper, MD      MDM Rules/Calculators/A&P Medical Decision Making Problems Addressed: Dysphagia, unspecified type: acute illness or injury     Final Clinical Impression(s) / ED Diagnoses Final diagnoses:  Dysphagia, unspecified type    Rx / DC Orders ED Discharge Orders     None        Charmayne Cooper, MD 02/25/24 [phone]

## 2024-02-25 NOTE — Hospital Course (Signed)
 73 year old female with history of stroke 01/2021, coronary artery disease with NSTEMI in 07/2023 treated medically, cerebral aneurysm (5 mm ACA aneurysm noted on brain MRI 05/2023), severe dementia, hyperlipidemia, stage 3a CKD, HTN, chronic dysphagia, HFpEF with grade 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  She was initially seen in ED and had a negative workup and subsequently sent back home.  Husband brought her back to ED several hours later reporting that she is still having difficulty managing her secretions and choking when trying to drink water and not able to eat or drink without choking spells.  He said that this really has progressively worsened over last few weeks as he has been having more difficulty with getting her to take her medications.  She has required suctioning in the ED on several occasions.  CXR not showing infiltrates but she has had some drop in pulse ox to 90% and with her ongoing difficult swallowing she will need a forma speech therapy evaluation and admission was requested for further management.

## 2024-02-25 NOTE — ED Notes (Signed)
 This RN used soft 14 Fr suction catheter with chimney valve to suction throat secretions. Patient reports improvement.

## 2024-02-25 NOTE — H&P (Signed)
 History and Physical  [facility]  Susan Davidson:[id number] DOB: 1951-08-14 DOA: 02/25/2024  PCP: Roslyn Coombe, MD  Patient coming from: Home  Level of care: Med-Surg  I have personally briefly reviewed patient's old medical records in [facility]  Chief Complaint: choking on pills   HPI: Susan Davidson is a 73 year old female with history of stroke 01/2021, coronary artery disease with NSTEMI in 07/2023 treated medically, cerebral aneurysm (5 mm ACA aneurysm noted on brain MRI 05/2023), severe dementia, hyperlipidemia, stage 3a CKD, HTN, chronic dysphagia, HFpEF with grade 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  She was initially seen in ED and had a negative workup and subsequently sent back home.  Husband brought her back to ED several hours later reporting that she is still having difficulty managing her secretions and choking when trying to drink water and not able to eat or drink without choking spells.  He said that this really has progressively worsened over last few weeks as he has been having more difficulty with getting her to take her medications.  She has required suctioning in the ED on several occasions.  CXR not showing infiltrates but she has had some drop in pulse ox to 90% and with her ongoing difficult swallowing she will need a forma speech therapy evaluation and admission was requested for further management.     Past Medical History:  Diagnosis Date   ANXIETY 02/12/2008   Qualifier: Diagnosis of  By: Autry Legions MD, Alveda Aures    CVA (cerebral vascular accident) [facility])    DEPRESSION 02/12/2008   Qualifier: Diagnosis of  By: Jarold Merlin    HYPERLIPIDEMIA 02/12/2008   Qualifier: Diagnosis of  By: Jarold Merlin    HYPERTENSION 02/12/2008   Qualifier: Diagnosis of  By: Jarold Merlin     HYPOTHYROIDISM 02/12/2008   Qualifier: Diagnosis of  By: Jarold Merlin    Impaired glucose tolerance 08/27/2011   Left hemiparesis (HCC)    OSTEOPENIA 02/12/2008   Qualifier: Diagnosis of  By: Autry Legions MD, Alveda Aures    VITAMIN D  DEFICIENCY 04/14/2010   Qualifier: Diagnosis of  By: Autry Legions MD, Alveda Aures     Past Surgical History:  Procedure Laterality Date   IR RADIOLOGIST EVAL & MGMT  08/18/2021   LEFT HEART CATH AND CORONARY ANGIOGRAPHY N/A 08/03/2023   Procedure: LEFT HEART CATH AND CORONARY ANGIOGRAPHY;  Surgeon: Kyra Phy, MD;  Location: [facility] INVASIVE CV LAB;  Service: Cardiovascular;  Laterality: N/A;     reports that she quit smoking about 3 years ago. Her smoking use included cigarettes. She has never used smokeless tobacco. She reports that she does not currently use alcohol. She reports that she does not use drugs.  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   Influenza Vac Split Quad     Fatigue, mimics a "super bad flu"    Family History  Problem Relation Age of Onset   Heart disease Father    Bipolar disorder Sister    Diabetes Neg Hx     Prior to Admission medications   Medication  Sig Start Date End Date Taking? Authorizing Provider  levothyroxine  (SYNTHROID ) 100 MCG tablet Take 1 tablet (100 mcg total) by mouth daily. 02/02/24   Roslyn Coombe, MD  acetaminophen  (TYLENOL ) 325 MG tablet Take 2 tablets (650 mg total) by mouth every 6 (six) hours as needed for mild pain or headache (fever >/= 101). 08/04/21   Colin Dawley, MD  albuterol  (VENTOLIN  HFA) 108 (90 Base) MCG/ACT inhaler Inhale 2 puffs into the lungs every 6 (six) hours as needed for wheezing or shortness of breath. 05/19/23   Colin Dawley, MD  alendronate  (FOSAMAX ) 70 MG  tablet Take 1 tablet (70 mg total) by mouth every 7 (seven) days. Take with a full glass of water on an empty stomach. 05/12/23   Roslyn Coombe, MD  ALPRAZolam  (XANAX ) 0.5 MG tablet TAKE 1 TABLET TWICE DAILY AS NEEDED FOR ANXIETY 01/11/24   Roslyn Coombe, MD  ascorbic acid  (VITAMIN C) 500 MG tablet Take 1 tablet (500 mg total) by mouth daily. Patient taking differently: Take 500 mg by mouth at bedtime. 08/05/21   Colin Dawley, MD  aspirin  EC 81 MG tablet Take 1 tablet (81 mg total) by mouth daily. Swallow whole. 08/06/23   Etter Hermann., MD  cephALEXin  (KEFLEX ) 500 MG capsule Take 1 capsule (500 mg total) by mouth 3 (three) times daily. 02/01/24   Roslyn Coombe, MD  Cholecalciferol (VITAMIN D -3 PO) Take 1 capsule by mouth at bedtime.    [provider]  clopidogrel  (PLAVIX ) 75 MG tablet Take 1 tablet (75 mg total) by mouth daily. 06/08/23   Roslyn Coombe, MD  Coenzyme Q10 (COQ10 PO) Take 1 tablet by mouth at bedtime.    [provider]  divalproex  (DEPAKOTE ) 250 MG DR tablet Take 1 tablet by mouth See admin instructions. 1 tablet in the morning and 2 tablets every evening 07/28/21   [provider]  folic acid  (FOLVITE ) 800 MCG tablet Take 400 mcg by mouth at bedtime.    [provider]  isosorbide  mononitrate (IMDUR ) 30 MG 24 hr tablet Take 1 tablet (30 mg total) by mouth daily. 12/07/23   West, Katlyn D, NP  lactose free nutrition (BOOST) LIQD Take 237 mLs by mouth 2 (two) times daily between meals. At least one a day and sometimes more    [provider]  linaclotide  (LINZESS ) 145 MCG CAPS capsule Take 1 capsule (145 mcg total) by mouth daily. 09/29/23   Ahmed, Muhammad F, MD  melatonin 5 MG TABS Take 5 mg by mouth at bedtime.    [provider]  memantine  (NAMENDA ) 5 MG tablet Take 1 tablet (5 mg total) by mouth 2 (two) times daily. 06/08/23   Roslyn Coombe, MD  metoprolol  tartrate (LOPRESSOR ) 25 MG tablet TAKE 1/2 TABLET TWICE DAILY 02/23/24    West, Katlyn D, NP  Multiple Vitamin (MULTIVITAMIN) tablet Take 1 tablet by mouth at bedtime.    [provider]  nitroGLYCERIN  (NITROSTAT ) 0.4 MG SL tablet Place 1 tablet (0.4 mg total) under the tongue every 5 (five) minutes as needed for chest pain. Up to 3 doses in one 24 hour period 01/02/24   West, Katlyn D, NP  OVER THE COUNTER MEDICATION Take 1 tablet by mouth at bedtime. magnesium    [provider]  pantoprazole  (PROTONIX ) 40 MG tablet Take 1 tablet (40 mg total) by mouth daily. 06/08/23   Roslyn Coombe, MD  PARoxetine  (PAXIL ) 40 MG tablet Take 40 mg by  mouth every morning. 01/01/22   [provider]  QUEtiapine (SEROQUEL) 50 MG tablet Take 50 mg by mouth at bedtime. 01/24/24   [provider]  rosuvastatin  (CRESTOR ) 20 MG tablet TAKE 1 TABLET AT BEDTIME 02/23/24   West, Katlyn D, NP    Physical Exam: Vitals:   02/25/24 0643 02/25/24 0644 02/25/24 0645 02/25/24 0700  BP:   (!) 149/86 (!) 133/97  Pulse: 89 90 90 90  Resp:      Temp:      SpO2: 91% 93% 94% 94%    Constitutional: severely demented patient, coarse upper airway congestion noise, NAD, calm, comfortable Eyes: PERRL, lids and conjunctivae normal ENMT: Mucous membranes are moist. Posterior pharynx clear of any exudate or lesions.Normal dentition.  Neck: normal, supple, no masses, no thyromegaly Respiratory: coarse congestion sounds, diffuse rales heard.   Cardiovascular: normal s1, s2 sounds, no murmurs / rubs / gallops. No extremity edema. 2+ pedal pulses. No carotid bruits.  Abdomen: no tenderness, no masses palpated. No hepatosplenomegaly. Bowel sounds positive.  Musculoskeletal: no clubbing / cyanosis. No joint deformity upper and lower extremities. Good ROM, no contractures. Normal muscle tone.  Skin: no rashes, lesions, ulcers. No induration Neurologic: left hemiparesis.   Psychiatric: Poor judgment and insight. Alert and oriented x 3. Normal mood.   Labs on Admission: I have  personally reviewed following labs and imaging studies  CBC: Recent Labs  Lab 02/24/24 2354  WBC 7.6  NEUTROABS 4.8  HGB 14.2  HCT 43.6  MCV 102.8*  PLT 164   Basic Metabolic Panel: Recent Labs  Lab 02/24/24 2354  NA 137  K 3.8  CL 108  CO2 24  GLUCOSE 120*  BUN 21  CREATININE 0.92  CALCIUM  8.7*   GFR: Estimated Creatinine Clearance: 46.7 mL/min (by C-G formula based on SCr of 0.92 mg/dL). Liver Function Tests: No results for input(s): "AST", "ALT", "ALKPHOS", "BILITOT", "PROT", "ALBUMIN" in the last 168 hours. No results for input(s): "LIPASE", "AMYLASE" in the last 168 hours. No results for input(s): "AMMONIA" in the last 168 hours. Coagulation Profile: No results for input(s): "INR", "PROTIME" in the last 168 hours. Cardiac Enzymes: No results for input(s): "CKTOTAL", "CKMB", "CKMBINDEX", "TROPONINI" in the last 168 hours. BNP (last 3 results) No results for input(s): "PROBNP" in the last 8760 hours. HbA1C: No results for input(s): "HGBA1C" in the last 72 hours. CBG: No results for input(s): "GLUCAP" in the last 168 hours. Lipid Profile: No results for input(s): "CHOL", "HDL", "LDLCALC", "TRIG", "CHOLHDL", "LDLDIRECT" in the last 72 hours. Thyroid  Function Tests: No results for input(s): "TSH", "T4TOTAL", "FREET4", "T3FREE", "THYROIDAB" in the last 72 hours. Anemia Panel: No results for input(s): "VITAMINB12", "FOLATE", "FERRITIN", "TIBC", "IRON", "RETICCTPCT" in the last 72 hours. Urine analysis:    Component Value Date/Time   COLORURINE AMBER (A) 05/17/2023 1138   APPEARANCEUR HAZY (A) 05/17/2023 1138   LABSPEC 1.026 05/17/2023 1138   PHURINE 5.0 05/17/2023 1138   GLUCOSEU NEGATIVE 05/17/2023 1138   GLUCOSEU NEGATIVE 01/10/2022 1256   HGBUR NEGATIVE 05/17/2023 1138   BILIRUBINUR NEGATIVE 05/17/2023 1138   KETONESUR 20 (A) 05/17/2023 1138   PROTEINUR 30 (A) 05/17/2023 1138   UROBILINOGEN 0.2 01/10/2022 1256   NITRITE NEGATIVE 05/17/2023 1138    LEUKOCYTESUR NEGATIVE 05/17/2023 1138    Radiological Exams on Admission: DG Chest Port 1 View Result Date: 02/25/2024 CLINICAL DATA:  Aspiration. EXAM: PORTABLE CHEST 1 VIEW COMPARISON:  02/24/2024 FINDINGS: The lungs are suboptimally inflated. Stable cardiomediastinal contours. No significant pleural  effusion, interstitial or edema. No airspace consolidation. Chronic mid shaft of left clavicle fracture. IMPRESSION: Low lung volumes. No acute findings. Electronically Signed   By: Kimberley Penman M.D.   On: 02/25/2024 08:08   DG Chest Port 1 View Result Date: 02/25/2024 CLINICAL DATA:  cough, SOB, hypoxia EXAM: PORTABLE CHEST - 1 VIEW COMPARISON:  08/02/2023. FINDINGS: Cardiac silhouette is unremarkable. No pneumothorax or pleural effusion. The lungs are clear. Aorta is calcified. The visualized skeletal structures are unremarkable. IMPRESSION: No acute cardiopulmonary process. Electronically Signed   By: Sydell Eva M.D.   On: 02/25/2024 00:08   EKG: Independently reviewed. NSR no acute ST-T changes   Assessment/Plan Principal Problem:   Choking Active Problems:   Acquired hypothyroidism   HLD (hyperlipidemia)   Anxiety state   Depression   Essential hypertension   HYPERSOMNIA   Daytime somnolence   CKD (chronic kidney disease)   Ambulatory dysfunction   CKD (chronic kidney disease) stage 3, GFR 30-59 ml/min (HCC)   Bilateral hearing loss   History of stroke   Bipolar illness (HCC)   Confusion   Aneurysm (HCC)   Left arm weakness   Dysphagia   Chronic idiopathic constipation   Dementia without behavioral disturbance (HCC)   Chronic Dysphagia with acute worsening  Choking episode  Possible aspiration event Question of aspiration pneumonia  - Pt has copious oral secretions requiring suctioning at bedside. She cannot handle secretion at this time - continue NPO status for now.  Aspiration precautions advised.  - consultation to SLP for evaluation of swallow and MBS  -  suction intermittently as needed at bedside - agree with IV ampicillin/sulbactam for now - further recommendations pending SLP evaluation   Severe Dementia - will resume home medication when able to take p.o. again - delirium precautions advised  - pt still full code, will ask for palliative consult for goals of care especially in light of issues with swallowing, drinking and eating and taking pills.   CKD stage 3a  - stable, follow  CAD s/p NSTEMI - resume home cardiac medications when able to take oral pills again  Hypothyroidism  - resume home levothyroxine  when able to take pills   DVT prophylaxis: enoxaparin    Code Status: Full   Family Communication: husband at bedside   Disposition Plan: TBD   Consults called: SLP and palliative care   Admission status: INP Time spent: 65 mins   Level of care: Med-Surg Faustino Hook MD [facility] How to contact the [facility] Attending or Consulting provider 7A - 7P or covering provider during after hours 7P -7A, for this patient?  Check the care team in [facility] and look for a) attending/consulting TR[facility] provider listed and b) the TRH team listed Log into [email or web address] and use [place]'s universal password to access. If you do not have the password, please contact the hospital operator. Locate the TRH provider you are looking for under [facility] and page to a number that you can be directly reached. If you still have difficulty reaching the provider, please page the [facility] (Director on Call) for the Hospitalists listed on amion for assistance.   If 7PM-7AM, please contact night-coverage [email or web address] Password [facility]  02/25/2024, 10:22 AM

## 2024-02-25 NOTE — ED Notes (Signed)
 Yanker suctioned patient , made to cough secretions ( small amount white to clear) Patient has limited cough.

## 2024-02-25 NOTE — Plan of Care (Signed)
   Problem: Coping: Goal: Level of anxiety will decrease Outcome: Progressing   Problem: Elimination: Goal: Will not experience complications related to bowel motility Outcome: Progressing Goal: Will not experience complications related to urinary retention Outcome: Progressing

## 2024-02-25 NOTE — ED Provider Notes (Signed)
 And change of shift care assumed from Dr. Bolivar Bushman, patient has episode of aspiration yesterday, continues to be hypoxic down to 90%, she does not use home oxygen.  On my exam the patient cannot speak because of the secretions and difficulty tolerating them, she cannot swallow the secretions and is requiring suctioning and supplemental oxygen.  I have repeated the x-ray here and I do not see any infiltrate.  The labs from yesterday were reviewed and were unremarkable, the patient will be admitted to the hospital, I appreciate Dr. Lincoln Renshaw for admitting and taking care of this patient.   Early Glisson, MD 02/25/24 [phone]

## 2024-02-25 NOTE — ED Triage Notes (Signed)
 Pt husband states pt keeps choking on her secretions and was not able to tolerate it at home. Seen here earlier for same.

## 2024-02-26 ENCOUNTER — Encounter (HOSPITAL_COMMUNITY): Payer: Self-pay | Admitting: Family Medicine

## 2024-02-26 DIAGNOSIS — T17308D Unspecified foreign body in larynx causing other injury, subsequent encounter: Secondary | ICD-10-CM

## 2024-02-26 DIAGNOSIS — Z7189 Other specified counseling: Secondary | ICD-10-CM | POA: Diagnosis not present

## 2024-02-26 DIAGNOSIS — Z515 Encounter for palliative care: Secondary | ICD-10-CM

## 2024-02-26 DIAGNOSIS — E039 Hypothyroidism, unspecified: Secondary | ICD-10-CM

## 2024-02-26 DIAGNOSIS — F0390 Unspecified dementia without behavioral disturbance: Secondary | ICD-10-CM | POA: Diagnosis not present

## 2024-02-26 DIAGNOSIS — R1310 Dysphagia, unspecified: Secondary | ICD-10-CM

## 2024-02-26 DIAGNOSIS — N1831 Chronic kidney disease, stage 3a: Secondary | ICD-10-CM

## 2024-02-26 DIAGNOSIS — R410 Disorientation, unspecified: Secondary | ICD-10-CM

## 2024-02-26 DIAGNOSIS — Z8673 Personal history of transient ischemic attack (TIA), and cerebral infarction without residual deficits: Secondary | ICD-10-CM

## 2024-02-26 LAB — CBC
HCT: 39.1 % (ref 36.0–46.0)
Hemoglobin: 12.4 g/dL (ref 12.0–15.0)
MCH: 32.3 pg (ref 26.0–34.0)
MCHC: 31.7 g/dL (ref 30.0–36.0)
MCV: 101.8 fL — ABNORMAL HIGH (ref 80.0–100.0)
Platelets: 152 10*3/uL (ref 150–400)
RBC: 3.84 MIL/uL — ABNORMAL LOW (ref 3.87–5.11)
RDW: 15.4 % (ref 11.5–15.5)
WBC: 11.2 10*3/uL — ABNORMAL HIGH (ref 4.0–10.5)
nRBC: 0 % (ref 0.0–0.2)

## 2024-02-26 LAB — BASIC METABOLIC PANEL WITH GFR
Anion gap: 7 (ref 5–15)
BUN: 23 mg/dL (ref 8–23)
CO2: 25 mmol/L (ref 22–32)
Calcium: 8.2 mg/dL — ABNORMAL LOW (ref 8.9–10.3)
Chloride: 107 mmol/L (ref 98–111)
Creatinine, Ser: 0.91 mg/dL (ref 0.44–1.00)
GFR, Estimated: >60 mL/min (ref >60)
Glucose, Bld: 122 mg/dL — ABNORMAL HIGH (ref 70–99)
Potassium: 3.5 mmol/L (ref 3.5–5.1)
Sodium: 139 mmol/L (ref 135–145)

## 2024-02-26 MED ORDER — LACTATED RINGERS IV SOLN: INTRAVENOUS | Status: AC

## 2024-02-26 NOTE — TOC CM/SW Note (Signed)
 Transition of Care [facility]) - Inpatient Brief Assessment   Patient Details  Name: Susan Davidson MRN: [id number] Date of Birth: [birth year]  Transition of Care [facility]) CM/SW Contact:    Grandville Lax, LCSWA Phone Number: 02/26/2024, 9:08 AM   Clinical Narrative: Transition of Care Department [facility]) has reviewed patient and no TOC needs have been identified at this time. We will continue to monitor patient advancement through interdiciplinary progression rounds. If new patient transition needs arise, please place a TOC consult.   Transition of Care Asessment: Insurance and Status: Insurance coverage has been reviewed Patient has primary care physician: Yes Home environment has been reviewed: from home with spouse Prior level of function:: has help if needed Prior/Current Home Services: No current home services Social Drivers of Health Review: SDOH reviewed no interventions necessary Readmission risk has been reviewed: Yes Transition of care needs: no transition of care needs at this time

## 2024-02-26 NOTE — Plan of Care (Signed)
   Problem: Clinical Measurements: Goal: Ability to maintain clinical measurements within normal limits will improve Outcome: Progressing Goal: Will remain free from infection Outcome: Progressing Goal: Diagnostic test results will improve Outcome: Progressing Goal: Respiratory complications will improve Outcome: Progressing Goal: Cardiovascular complication will be avoided Outcome: Progressing   Problem: Activity: Goal: Risk for activity intolerance will decrease Outcome: Progressing   Problem: Coping: Goal: Level of anxiety will decrease Outcome: Progressing   Problem: Elimination: Goal: Will not experience complications related to bowel motility Outcome: Progressing Goal: Will not experience complications related to urinary retention Outcome: Progressing   Problem: Pain Managment: Goal: General experience of comfort will improve and/or be controlled Outcome: Progressing   Problem: Safety: Goal: Ability to remain free from injury will improve Outcome: Progressing   Problem: Skin Integrity: Goal: Risk for impaired skin integrity will decrease Outcome: Progressing

## 2024-02-26 NOTE — Evaluation (Signed)
 Clinical/Bedside Swallow Evaluation Patient Details  Name: Susan Davidson MRN: [id number] Date of Birth: [birth year]  Today's Date: 02/26/2024 Time: SLP Start Time (ACUTE ONLY): 0955 SLP Stop Time (ACUTE ONLY): 1020 SLP Time Calculation (min) (ACUTE ONLY): 25 min  Past Medical History:  Past Medical History:  Diagnosis Date   ANXIETY 02/12/2008   Qualifier: Diagnosis of  By: Autry Legions MD, Alveda Aures    CVA (cerebral vascular accident) [facility])    DEPRESSION 02/12/2008   Qualifier: Diagnosis of  By: Jarold Merlin    HYPERLIPIDEMIA 02/12/2008   Qualifier: Diagnosis of  By: Jarold Merlin    HYPERTENSION 02/12/2008   Qualifier: Diagnosis of  By: Jarold Merlin    HYPOTHYROIDISM 02/12/2008   Qualifier: Diagnosis of  By: Jarold Merlin    Impaired glucose tolerance 08/27/2011   Left hemiparesis (HCC)    OSTEOPENIA 02/12/2008   Qualifier: Diagnosis of  By: Autry Legions MD, Alveda Aures    VITAMIN D  DEFICIENCY 04/14/2010   Qualifier: Diagnosis of  By: Autry Legions MD, Alveda Aures    Past Surgical History:  Past Surgical History:  Procedure Laterality Date   IR RADIOLOGIST EVAL & MGMT  08/18/2021   LEFT HEART CATH AND CORONARY ANGIOGRAPHY N/A 08/03/2023   Procedure: LEFT HEART CATH AND CORONARY ANGIOGRAPHY;  Surgeon: Kyra Phy, MD;  Location: [facility] INVASIVE CV LAB;  Service: Cardiovascular;  Laterality: N/A;   HPI:  Susan Davidson is a 73 year old female with history of stroke 01/2021, coronary artery disease with NSTEMI in 07/2023 treated medically, cerebral aneurysm (5 mm ACA aneurysm noted on brain MRI 05/2023), severe dementia, hyperlipidemia, stage 3a CKD, HTN, chronic dysphagia, HFpEF with grade 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  She was initially seen in ED and had a negative workup and subsequently sent back home.  Husband  brought her back to ED several hours later reporting that she is still having difficulty managing her secretions and choking when trying to drink water and not able to eat or drink without choking spells.  He said that this really has progressively worsened over last few weeks as he has been having more difficulty with getting her to take her medications.  She has required suctioning in the ED on several occasions.  CXR not showing infiltrates but she has had some drop in pulse ox to 90% and with her ongoing difficult swallowing she will need a forma speech therapy evaluation and admission was requested for further management. BSE requested    Assessment / Plan / Recommendation  Clinical Impression  Clinical swallow evaluation completed at bedside. Pt with residual left facial weakness from previous stroke in 2022. Pt typically consumes regular textures and thin liquids, but she indicates some coughing with liquids. Pt leans to her right despite repositioning. Pt assessed with ice chips, thin water via tsp/cup/straw with and without a chin tuck, NTL,puree, and regular textures. Pt with occasional cough with thin liquids via cup and with nectars (more with cup thin than NTL). Chin tuck with straw sips appeared to reduce coughing. Pt with prolonged oral transit with solids and benefited from liquid wash. Pt repeatedly requested more water and does not seem to want food. Pt states that she likes ice cream and mostly eats in bed at home (it elevates). She reportedly had difficulty swallowing her pills which lead to  her admission. Recommend D3/mech soft and NTL with use of straw and chin tuck, PO medications whole with puree, OK for thin water between meals only and after oral care via straw sips and implement chin tuck. Will try to complete MBSS during her hospitalization depending on her length of stay. SLP will follow during acute. Above to RN. SLP Visit Diagnosis: Dysphagia, unspecified (R13.10)    Aspiration  Risk  Mild aspiration risk;Risk for inadequate nutrition/hydration    Diet Recommendation Dysphagia 3 (Mech soft);Nectar-thick liquid;Free water protocol after oral care    Liquid Administration via: Straw (with chin tuck) Medication Administration: Whole meds with puree Supervision: Staff to assist with self feeding;Full supervision/cueing for compensatory strategies Compensations: Slow rate;Small sips/bites;Multiple dry swallows after each bite/sip;Clear throat intermittently;Use straw to facilitate chin tuck Postural Changes: Seated upright at 90 degrees;Remain upright for at least 30 minutes after po intake    Other  Recommendations Oral Care Recommendations: Oral care BID;Oral care prior to ice chip/H20;Staff/trained caregiver to provide oral care Caregiver Recommendations: Have oral suction available    Recommendations for follow up therapy are one component of a multi-disciplinary discharge planning process, led by the attending physician.  Recommendations may be updated based on patient status, additional functional criteria and insurance authorization.  Follow up Recommendations Follow physician's recommendations for discharge plan and follow up therapies      Assistance Recommended at Discharge    Functional Status Assessment Patient has had a recent decline in their functional status and demonstrates the ability to make significant improvements in function in a reasonable and predictable amount of time.  Frequency and Duration min 2x/week  1 week       Prognosis Prognosis for improved oropharyngeal function: Fair Barriers to Reach Goals: Cognitive deficits      Swallow Study   General Date of Onset: 02/25/24 HPI: [name] is a 73 year old female with history of stroke 01/2021, coronary artery disease with NSTEMI in 07/2023 treated medically, cerebral aneurysm (5 mm ACA aneurysm noted on brain MRI 05/2023), severe dementia, hyperlipidemia, stage 3a CKD, HTN, chronic  dysphagia, HFpEF with grade 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  She was initially seen in ED and had a negative workup and subsequently sent back home.  Husband brought her back to ED several hours later reporting that she is still having difficulty managing her secretions and choking when trying to drink water and not able to eat or drink without choking spells.  He said that this really has progressively worsened over last few weeks as he has been having more difficulty with getting her to take her medications.  She has required suctioning in the ED on several occasions.  CXR not showing infiltrates but she has had some drop in pulse ox to 90% and with her ongoing difficult swallowing she will need a forma speech therapy evaluation and admission was requested for further management. BSE requested Type of Study: Bedside Swallow Evaluation Previous Swallow Assessment: BSE reg/thin 08/2023 Diet Prior to this Study: NPO Temperature Spikes Noted: No Respiratory Status: Room air History of Recent Intubation: No Behavior/Cognition: Alert;Cooperative;Pleasant mood Oral Cavity Assessment: Dry Oral Care Completed by SLP: Yes Oral Cavity - Dentition: Adequate natural dentition;Missing dentition Vision: Functional for self-feeding Self-Feeding Abilities: Needs assist Patient Positioning: Upright in bed Baseline Vocal Quality: Hoarse Volitional Cough: Strong;Congested Volitional Swallow: Able  to elicit    Oral/Motor/Sensory Function Overall Oral Motor/Sensory Function: Mild impairment Facial ROM: Reduced left;Suspected CN VII (facial) dysfunction Facial Symmetry: Abnormal symmetry left;Suspected CN VII (facial) dysfunction Facial Strength: Reduced left;Suspected CN VII (facial) dysfunction Facial Sensation: Within Functional Limits Lingual ROM: Within  Functional Limits Lingual Symmetry: Within Functional Limits Lingual Strength: Reduced Lingual Sensation: Within Functional Limits Velum: Within Functional Limits Mandible: Within Functional Limits   Ice Chips Ice chips: Within functional limits Presentation: Spoon   Thin Liquid Thin Liquid: Impaired Presentation: Cup;Self Fed;Spoon;Straw Pharyngeal  Phase Impairments: Suspected delayed Swallow;Cough - Immediate Other Comments: appears improved with chin tuck with straw and small sips    Nectar Thick Nectar Thick Liquid: Impaired Presentation: Cup;Straw Pharyngeal Phase Impairments: Cough - Immediate   Honey Thick Honey Thick Liquid: Not tested   Puree Puree: Within functional limits Presentation: Spoon   Solid     Solid: Impaired Oral Phase Impairments: Reduced lingual movement/coordination Oral Phase Functional Implications: Prolonged oral transit     Thank you,  Claudetta Cuba, CCC-SLP [phone]  Bookert Guzzi 02/26/2024,10:36 AM

## 2024-02-26 NOTE — Progress Notes (Signed)
 PROGRESS NOTE   Susan Davidson  WUJ:[id number] DOB: [birth year] DOA: 02/25/2024 PCP: Roslyn Coombe, MD   Chief Complaint  Patient presents with   Dysphagia   Level of care: Telemetry  Brief Admission History:  73 year old female with history of stroke 01/2021, coronary artery disease with NSTEMI in 07/2023 treated medically, cerebral aneurysm (5 mm ACA aneurysm noted on brain MRI 05/2023), severe dementia, hyperlipidemia, stage 3a CKD, HTN, chronic dysphagia, HFpEF with grade 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  She was initially seen in ED and had a negative workup and subsequently sent back home.  Husband brought her back to ED several hours later reporting that she is still having difficulty managing her secretions and choking when trying to drink water and not able to eat or drink without choking spells.  He said that this really has progressively worsened over last few weeks as he has been having more difficulty with getting her to take her medications.  She has required suctioning in the ED on several occasions.  CXR not showing infiltrates but she has had some drop in pulse ox to 90% and with her ongoing difficult swallowing she will need a forma speech therapy evaluation and admission was requested for further management.     Assessment and Plan:  Chronic Dysphagia with acute worsening  Choking episode  Possible aspiration event Question of aspiration pneumonia  - Pt has been evaluated by speech therapist and Dys 3 diet with NTL ordered; see notes, MBS in next 1-2 days when available  - Aspiration precautions advised.  - suction intermittently as needed at bedside - agree with IV ampicillin /sulbactam as ordered   Severe Dementia - will resume home medication when home meds have been reconciled - delirium precautions advised  - pt  still full code, will ask for palliative consult for goals of care especially in light of issues with swallowing, drinking and eating and taking pills.   CKD stage 3a  - stable, follow  CAD s/p NSTEMI - resume home cardiac medications when home meds have been reconciled   Hypothyroidism  - resume home levothyroxine     DVT prophylaxis: enoxaparin    Code Status: Full   Family Communication: husband at bedside 5/25 Disposition Plan: home, considering outpatient palliative    Consults called: SLP and palliative care   Admission status: INP  Consultants:  Palliative  Speech therapist  Procedures:   Antimicrobials:    Subjective: Pt says her mouth is dry and wanting ice chips.   Objective: Vitals:   02/26/24 0910 02/26/24 1000 02/26/24 1100 02/26/24 1139  BP:  (!) 154/88 (!) 160/79   Pulse: 80 85 84   Resp: 20 (!) 21 20   Temp:    98.3 F (36.8 C)  TempSrc:    Oral  SpO2: 100% 98% 94%     Intake/Output Summary (Last 24 hours) at 02/26/2024 1419 Last data filed at 02/26/2024 0600 Gross per 24 hour  Intake 300 ml  Output 400 ml  Net -100 ml   There were no vitals filed for this visit. Examination:  General exam: frail, elderly female, with advanced dementia. Cachectic, Appears calm and comfortable, very dry mucus membranes.   Respiratory system: Clear to auscultation. Respiratory effort normal. Cardiovascular system: normal S1 & S2 heard. No JVD, murmurs, rubs, gallops or clicks. No pedal edema. Gastrointestinal  system: Abdomen is nondistended, soft and nontender. No organomegaly or masses felt. Normal bowel sounds heard. Central nervous system: Alert and disoriented. No focal neurological deficits. Extremities: Symmetric 5 x 5 power. Skin: No rashes, lesions or ulcers. Psychiatry: Judgement and insight appear severely diminished. Mood & affect appropriate.   Data Reviewed: I have personally reviewed following labs and imaging studies  CBC: Recent Labs  Lab  02/24/24 2354 02/26/24 0246  WBC 7.6 11.2*  NEUTROABS 4.8  --   HGB 14.2 12.4  HCT 43.6 39.1  MCV 102.8* 101.8*  PLT 164 152    Basic Metabolic Panel: Recent Labs  Lab 02/24/24 2354 02/26/24 0246  NA 137 139  K 3.8 3.5  CL 108 107  CO2 24 25  GLUCOSE 120* 122*  BUN 21 23  CREATININE 0.92 0.91  CALCIUM  8.7* 8.2*    CBG: No results for input(s): "GLUCAP" in the last 168 hours.  Recent Results (from the past 240 hours)  MRSA Next Gen by PCR, Nasal     Status: None   Collection Time: 02/25/24 12:10 PM   Specimen: Nasal Mucosa; Nasal Swab  Result Value Ref Range Status   MRSA by PCR Next Gen NOT DETECTED NOT DETECTED Final    Comment: (NOTE) The GeneXpert MRSA Assay (FDA approved for NASAL specimens only), is one component of a comprehensive MRSA colonization surveillance program. It is not intended to diagnose MRSA infection nor to guide or monitor treatment for MRSA infections. Test performance is not FDA approved in patients less than 71 years old. Performed at [facility], [address], [place], [place] [address]      Radiology Studies: [facility] Chest [facility] 1 View Result Date: 02/25/2024 CLINICAL DATA:  Aspiration. EXAM: PORTABLE CHEST 1 VIEW COMPARISON:  02/24/2024 FINDINGS: The lungs are suboptimally inflated. Stable cardiomediastinal contours. No significant pleural effusion, interstitial or edema. No airspace consolidation. Chronic mid shaft of left clavicle fracture. IMPRESSION: Low lung volumes. No acute findings. Electronically Signed   By: Kimberley Penman M.D.   On: 02/25/2024 08:08   DG Chest Port 1 View Result Date: 02/25/2024 CLINICAL DATA:  cough, SOB, hypoxia EXAM: PORTABLE CHEST - 1 VIEW COMPARISON:  08/02/2023. FINDINGS: Cardiac silhouette is unremarkable. No pneumothorax or pleural effusion. The lungs are clear. Aorta is calcified. The visualized skeletal structures are unremarkable. IMPRESSION: No acute cardiopulmonary process. Electronically Signed   By:  Sydell Eva M.D.   On: 02/25/2024 00:08   Scheduled Meds:  Chlorhexidine Gluconate Cloth  6 each Topical Q0600   enoxaparin  (LOVENOX ) injection  40 mg Subcutaneous Q24H   Continuous Infusions:  acetaminophen  Stopped (02/25/24 2125)   ampicillin-sulbactam (UNASYN) IV 3 g (02/26/24 1342)   lactated ringers  40 mL/hr at 02/26/24 0912    LOS: 1 day   Time spent: 55 mins  Aunesty Tyson Lincoln Renshaw, MD How to contact the [facility] Attending or Consulting provider 7A - 7P or covering provider during after hours 7P -7A, for this patient?  Check the care team in [facility] and look for a) attending/consulting [facility] provider listed and b) the [facility] team listed Log into [email or web address] to find provider on call.  Locate the [facility] provider you are looking for under [facility] and page to a number that you can be directly reached. If you still have difficulty reaching the provider, please page the [facility] (Director on Call) for the Hospitalists listed on amion for assistance.  02/26/2024, 2:19 PM

## 2024-02-26 NOTE — Plan of Care (Signed)

## 2024-02-26 NOTE — Consult Note (Signed)
 Consultation Note Date: 02/26/2024   Patient Name: Susan Davidson  DOB: December 16, 1950  MRN: [id number]  Age / Sex: 73 y.o., female  PCP: Roslyn Coombe, MD Referring Physician: Rayfield Cairo, MD  Reason for Consultation: Establishing goals of care  HPI/Patient Profile: 73 y.o. female  with past medical history of stroke April 2022, CAD with NSTEMI October 2024 treated medically, cerebral aneurysm on MRI August 2024, severe dementia, HTN/HLD, CKD 3, heart failure with preserved EF, dysphagia that is chronic, GERD admitted on 02/25/2024 with chronic dysphagia with acute worsening and possible aspiration event.   Clinical Assessment and Goals of Care: I have reviewed medical records including EPIC notes, labs and imaging, received report from RN, assessed the patient.  Mrs. Jefcoat is lying quietly in bed.  She appears acutely/chronically ill and very frail.  She greets me, making and somewhat keeping eye contact.  She is alert, oriented to person and place only at this time.  I believe that she can make her basic needs known if given enough time.  There is no family at bedside at this time.  Face-to-face conference with bedside nursing staff related to patient condition, needs, speech therapy evaluation.  Call to spouse, Lavonia Powers, to discuss diagnosis prognosis, GOC, EOL wishes, disposition and options.  No answer, left voicemail message.  PMT to continue to reach out. A brief life review of the patient.  From May 19, 2019 for consult: "Mr. Mrs. Kluge have been married for 26 years.  She has no children.  She worked as a Arboriculturist.  She has aides services 5 days a week and every other Saturday.  Lavonia Powers states that they also have respite care when needed.  He had been working on some home health services through [facility].".  Advanced directives, concepts specific to code status, artifical  feeding and hydration, and rehospitalization were considered and discussed.  Mrs. Hamre remains full scope/full code by history.  She has dementia and is unable to complete these discussions.  Unable to reach husband at this time.  PMT to continue to follow  Discussed the importance of continued conversation with family and the medical providers regarding overall plan of care and treatment options, ensuring decisions are within the context of the patient's values and GOCs.  Questions and concerns were addressed.   The family was encouraged to call with questions or concerns.  PMT will continue to support holistically.  Conference with attending, bedside nursing staff, transition of care team related to patient condition, needs, goals of care, disposition.   HCPOA  NEXT OF KIN -spouse, Euline Kimbler.    SUMMARY OF RECOMMENDATIONS   Continue full scope/full code by history Anticipate return home when able Was to be connected with [facility] palliative services upon discharge August of 24 Would benefit from outpatient palliative services   Code Status/Advance Care Planning: Full code  Symptom Management:  Per hospitalist, no additional needs at this time.  Palliative Prophylaxis:  Frequent Pain Assessment, Oral Care, and Turn Reposition  Additional Recommendations (Limitations, Scope, Preferences): Full Scope Treatment  Psycho-social/Spiritual:  Desire for further Chaplaincy support:no Additional Recommendations: Caregiving  Support/Resources and Education on Hospice  Prognosis:  Unable to determine, based on outcomes.  6 months or less would not be surprising based on chronic illness burden, poor functional status, chronic dysphagia with anticipated recurrent aspiration.  Discharge Planning: Home with Home Health      Primary Diagnoses: Present on Admission:  Choking  Acquired hypothyroidism  Ambulatory dysfunction  Aneurysm (HCC)  Anxiety state  Bilateral hearing loss  Bipolar  illness (HCC)  Chronic idiopathic constipation  CKD (chronic kidney disease)  CKD (chronic kidney disease) stage 3, GFR 30-59 ml/min (HCC)  Confusion  Daytime somnolence  Dementia without behavioral disturbance (HCC)  Depression  Essential hypertension  HLD (hyperlipidemia)  HYPERSOMNIA  Left arm weakness   I have reviewed the medical record, interviewed the patient and family, and examined the patient. The following aspects are pertinent.  Past Medical History:  Diagnosis Date   ANXIETY 02/12/2008   Qualifier: Diagnosis of  By: Autry Legions MD, Alveda Aures    CVA (cerebral vascular accident) [facility])    DEPRESSION 02/12/2008   Qualifier: Diagnosis of  By: Jarold Merlin    HYPERLIPIDEMIA 02/12/2008   Qualifier: Diagnosis of  By: Jarold Merlin    HYPERTENSION 02/12/2008   Qualifier: Diagnosis of  By: Jarold Merlin    HYPOTHYROIDISM 02/12/2008   Qualifier: Diagnosis of  By: Jarold Merlin    Impaired glucose tolerance 08/27/2011   Left hemiparesis (HCC)    OSTEOPENIA 02/12/2008   Qualifier: Diagnosis of  By: Autry Legions MD, Alveda Aures    VITAMIN D  DEFICIENCY 04/14/2010   Qualifier: Diagnosis of  By: Autry Legions MD, Alveda Aures    Social History   Socioeconomic History   Marital status: Married    Spouse name: Not on file   Number of children: 0   Years of education: Not on file   Highest education level: Not on file  Occupational History   Occupation: disabled  Tobacco Use   Smoking status: Former    Current packs/day: 0.00    Types: Cigarettes    Quit date: 01/26/2021    Years since quitting: 3.0   Smokeless tobacco: Never  Vaping Use   Vaping status: Never Used  Substance and Sexual Activity   Alcohol use: Not Currently   Drug use: No   Sexual activity: Not on file  Other Topics Concern   Not on file  Social History Narrative   Not on file   Social Drivers of Health   Financial Resource Strain: Low Risk  (04/27/2023)   Overall Financial Resource  Strain (CARDIA)    Difficulty of Paying Living Expenses: Not hard at all  Food Insecurity: No Food Insecurity (02/25/2024)   Hunger Vital Sign    Worried About Running Out of Food in the Last Year: Never true    Ran Out of Food in the Last Year: Never true  Transportation Needs: No Transportation Needs (02/25/2024)   PRAPARE - Transportation    Lack of Transportation (Medical): No    Lack of Transportation (Non-Medical): No  Physical Activity: Inactive (04/27/2023)   Exercise Vital Sign    Days of Exercise per Week: 0 days    Minutes of Exercise per Session: 0 min  Stress: Stress Concern Present (04/27/2023)   [employer] of Occupational Health - Occupational Stress Questionnaire    Feeling of Stress : To some extent  Social  Connections: Socially Isolated (02/25/2024)   Social Connection and Isolation Panel [NHANES]    Frequency of Communication with Friends and Family: Never    Frequency of Social Gatherings with Friends and Family: Never    Attends Religious Services: Never    Database administrator or Organizations: No    Attends Engineer, structural: Never    Marital Status: Married   Family History  Problem Relation Age of Onset   Heart disease Father    Bipolar disorder Sister    Diabetes Neg Hx    Scheduled Meds:  Chlorhexidine Gluconate Cloth  6 each Topical Q0600   enoxaparin  (LOVENOX ) injection  40 mg Subcutaneous Q24H   Continuous Infusions:  acetaminophen  Stopped (02/25/24 2125)   ampicillin-sulbactam (UNASYN) IV 3 g (02/26/24 0542)   lactated ringers  40 mL/hr at 02/26/24 0912   PRN Meds:.acetaminophen , acetaminophen  **OR** acetaminophen , hydrALAZINE , ondansetron  **OR** ondansetron  (ZOFRAN ) IV Medications Prior to Admission:  Prior to Admission medications   Medication Sig Start Date End Date Taking? Authorizing Provider  levothyroxine  (SYNTHROID ) 100 MCG tablet Take 1 tablet (100 mcg total) by mouth daily. 02/02/24   Roslyn Coombe, MD  acetaminophen   (TYLENOL ) 325 MG tablet Take 2 tablets (650 mg total) by mouth every 6 (six) hours as needed for mild pain or headache (fever >/= 101). 08/04/21   Colin Dawley, MD  albuterol  (VENTOLIN  HFA) 108 (90 Base) MCG/ACT inhaler Inhale 2 puffs into the lungs every 6 (six) hours as needed for wheezing or shortness of breath. 05/19/23   Colin Dawley, MD  alendronate  (FOSAMAX ) 70 MG tablet Take 1 tablet (70 mg total) by mouth every 7 (seven) days. Take with a full glass of water on an empty stomach. 05/12/23   Roslyn Coombe, MD  ALPRAZolam  (XANAX ) 0.5 MG tablet TAKE 1 TABLET TWICE DAILY AS NEEDED FOR ANXIETY 01/11/24   Roslyn Coombe, MD  ascorbic acid  (VITAMIN C) 500 MG tablet Take 1 tablet (500 mg total) by mouth daily. Patient taking differently: Take 500 mg by mouth at bedtime. 08/05/21   Colin Dawley, MD  aspirin  EC 81 MG tablet Take 1 tablet (81 mg total) by mouth daily. Swallow whole. 08/06/23   Etter Hermann., MD  cephALEXin  (KEFLEX ) 500 MG capsule Take 1 capsule (500 mg total) by mouth 3 (three) times daily. 02/01/24   Roslyn Coombe, MD  Cholecalciferol (VITAMIN D -3 PO) Take 1 capsule by mouth at bedtime.    [provider]  clopidogrel  (PLAVIX ) 75 MG tablet Take 1 tablet (75 mg total) by mouth daily. 06/08/23   Roslyn Coombe, MD  Coenzyme Q10 (COQ10 PO) Take 1 tablet by mouth at bedtime.    [provider]  divalproex  (DEPAKOTE ) 250 MG DR tablet Take 1 tablet by mouth See admin instructions. 1 tablet in the morning and 2 tablets every evening 07/28/21   [provider]  folic acid  (FOLVITE ) 800 MCG tablet Take 400 mcg by mouth at bedtime.    [provider]  isosorbide  mononitrate (IMDUR ) 30 MG 24 hr tablet Take 1 tablet (30 mg total) by mouth daily. 12/07/23   West, Katlyn D, NP  lactose free nutrition (BOOST) LIQD Take 237 mLs by mouth 2 (two) times daily between meals. At least one a day and sometimes more    [provider]  linaclotide  (LINZESS )  145 MCG CAPS capsule Take 1 capsule (145 mcg total) by mouth daily. 09/29/23   Ahmed, Muhammad F, MD  melatonin  5 MG TABS Take 5 mg by mouth at bedtime.    [provider]  memantine  (NAMENDA ) 5 MG tablet Take 1 tablet (5 mg total) by mouth 2 (two) times daily. 06/08/23   Roslyn Coombe, MD  metoprolol  tartrate (LOPRESSOR ) 25 MG tablet TAKE 1/2 TABLET TWICE DAILY 02/23/24   West, Katlyn D, NP  Multiple Vitamin (MULTIVITAMIN) tablet Take 1 tablet by mouth at bedtime.    [provider]  nitroGLYCERIN  (NITROSTAT ) 0.4 MG SL tablet Place 1 tablet (0.4 mg total) under the tongue every 5 (five) minutes as needed for chest pain. Up to 3 doses in one 24 hour period 01/02/24   West, Katlyn D, NP  OVER THE COUNTER MEDICATION Take 1 tablet by mouth at bedtime. magnesium    [provider]  pantoprazole  (PROTONIX ) 40 MG tablet Take 1 tablet (40 mg total) by mouth daily. 06/08/23   Roslyn Coombe, MD  PARoxetine  (PAXIL ) 40 MG tablet Take 40 mg by mouth every morning. 01/01/22   [provider]  QUEtiapine (SEROQUEL) 50 MG tablet Take 50 mg by mouth at bedtime. 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   Influenza Vac Split Quad     Fatigue, mimics a "super bad flu"   Review of Systems  Unable to perform ROS: Dementia    Physical Exam Vitals and nursing note reviewed.     Vital Signs: BP (!) 160/79   Pulse 84   Temp 98.3 F (36.8 C) (Oral)   Resp 20   SpO2 94%  Pain Scale: 0-10   Pain Score: 3    SpO2: SpO2: 94 % O2 Device:SpO2: 94 % O2 Flow Rate: .O2 Flow Rate (L/min): 2 L/min  IO: Intake/output summary:   Intake/Output Summary (Last 24 hours) at 02/26/2024 1309 Last data filed at 02/26/2024 0600 Gross per 24 hour  Intake 300 ml  Output 400 ml  Net -100 ml    LBM: Last BM Date : 02/26/24 Baseline Weight:   Most recent weight:       Palliative Assessment/Data:     Time In: 1115  Time Out: 1155 Time Total: 40 minutes  Greater than 50%  of this time was spent counseling and coordinating care related to the above assessment and plan.  Signed by: Annabelle Barrack, NP   Please contact Palliative Medicine Team phone at [phone] for questions and concerns.  For individual provider: See Tilford Foley

## 2024-02-26 NOTE — Progress Notes (Signed)
 Pt with "choking" like episode again with nectar thick liquid. Pt with lots of coughing and gurgling, no drop of spO2. Relayed info to MD [employer]. Per MD continue to monitor at this point, no need for NPO. Speech notified.

## 2024-02-27 ENCOUNTER — Inpatient Hospital Stay (HOSPITAL_COMMUNITY)

## 2024-02-27 DIAGNOSIS — R1310 Dysphagia, unspecified: Secondary | ICD-10-CM | POA: Diagnosis not present

## 2024-02-27 DIAGNOSIS — E039 Hypothyroidism, unspecified: Secondary | ICD-10-CM | POA: Diagnosis not present

## 2024-02-27 DIAGNOSIS — N1831 Chronic kidney disease, stage 3a: Secondary | ICD-10-CM | POA: Diagnosis not present

## 2024-02-27 DIAGNOSIS — T17308D Unspecified foreign body in larynx causing other injury, subsequent encounter: Secondary | ICD-10-CM | POA: Diagnosis not present

## 2024-02-27 DIAGNOSIS — Z7189 Other specified counseling: Secondary | ICD-10-CM | POA: Diagnosis not present

## 2024-02-27 DIAGNOSIS — F0390 Unspecified dementia without behavioral disturbance: Secondary | ICD-10-CM | POA: Diagnosis not present

## 2024-02-27 DIAGNOSIS — Z515 Encounter for palliative care: Secondary | ICD-10-CM | POA: Diagnosis not present

## 2024-02-27 MED ORDER — DIVALPROEX SODIUM 250 MG PO DR TAB
250.0000 mg | DELAYED_RELEASE_TABLET | Freq: Every day | ORAL | Status: DC
  Administered 2024-02-27 – 2024-02-28 (×2): 250 mg via ORAL
  Filled 2024-02-27 (×2): qty 1

## 2024-02-27 MED ORDER — ORAL CARE MOUTH RINSE: 15.0000 mL | OROMUCOSAL | Status: DC | PRN

## 2024-02-27 MED ORDER — MEMANTINE HCL 10 MG PO TABS
5.0000 mg | ORAL_TABLET | Freq: Two times a day (BID) | ORAL | Status: DC
  Administered 2024-02-27 – 2024-02-28 (×3): 5 mg via ORAL
  Filled 2024-02-27 (×3): qty 1

## 2024-02-27 MED ORDER — NITROGLYCERIN 0.4 MG SL SUBL: 0.4000 mg | SUBLINGUAL_TABLET | SUBLINGUAL | Status: DC | PRN

## 2024-02-27 MED ORDER — PAROXETINE HCL 20 MG PO TABS
40.0000 mg | ORAL_TABLET | Freq: Every morning | ORAL | Status: DC
  Administered 2024-02-27 – 2024-02-28 (×2): 40 mg via ORAL
  Filled 2024-02-27 (×2): qty 2

## 2024-02-27 MED ORDER — BOOST PO LIQD
237.0000 mL | Freq: Two times a day (BID) | ORAL | Status: DC
  Administered 2024-02-27 – 2024-02-28 (×2): 237 mL via ORAL

## 2024-02-27 MED ORDER — FOLIC ACID 1 MG PO TABS
500.0000 ug | ORAL_TABLET | Freq: Every day | ORAL | Status: DC
  Administered 2024-02-27: 0.5 mg via ORAL
  Filled 2024-02-27: qty 1

## 2024-02-27 MED ORDER — CLOPIDOGREL BISULFATE 75 MG PO TABS
75.0000 mg | ORAL_TABLET | Freq: Every day | ORAL | Status: DC
  Administered 2024-02-27 – 2024-02-28 (×2): 75 mg via ORAL
  Filled 2024-02-27 (×2): qty 1

## 2024-02-27 MED ORDER — METOPROLOL TARTRATE 25 MG PO TABS
12.5000 mg | ORAL_TABLET | Freq: Two times a day (BID) | ORAL | Status: DC
  Administered 2024-02-27 – 2024-02-28 (×3): 12.5 mg via ORAL
  Filled 2024-02-27 (×3): qty 1

## 2024-02-27 MED ORDER — ROSUVASTATIN CALCIUM 20 MG PO TABS
20.0000 mg | ORAL_TABLET | Freq: Every day | ORAL | Status: DC
  Administered 2024-02-27: 20 mg via ORAL
  Filled 2024-02-27: qty 1

## 2024-02-27 MED ORDER — DIVALPROEX SODIUM 250 MG PO DR TAB
500.0000 mg | DELAYED_RELEASE_TABLET | Freq: Every day | ORAL | Status: DC
  Administered 2024-02-27: 500 mg via ORAL
  Filled 2024-02-27: qty 2

## 2024-02-27 MED ORDER — QUETIAPINE FUMARATE 25 MG PO TABS
50.0000 mg | ORAL_TABLET | Freq: Every day | ORAL | Status: DC
  Administered 2024-02-27: 50 mg via ORAL
  Filled 2024-02-27: qty 2

## 2024-02-27 MED ORDER — LEVOTHYROXINE SODIUM 100 MCG PO TABS
100.0000 ug | ORAL_TABLET | Freq: Every day | ORAL | Status: DC
  Administered 2024-02-27 – 2024-02-28 (×2): 100 ug via ORAL
  Filled 2024-02-27 (×2): qty 1

## 2024-02-27 MED ORDER — LINACLOTIDE 145 MCG PO CAPS
145.0000 ug | ORAL_CAPSULE | Freq: Every day | ORAL | Status: DC
  Administered 2024-02-27 – 2024-02-28 (×2): 145 ug via ORAL
  Filled 2024-02-27 (×2): qty 1

## 2024-02-27 MED ORDER — ASPIRIN 81 MG PO TBEC
81.0000 mg | DELAYED_RELEASE_TABLET | Freq: Every day | ORAL | Status: DC
  Administered 2024-02-27 – 2024-02-28 (×2): 81 mg via ORAL
  Filled 2024-02-27 (×2): qty 1

## 2024-02-27 MED ORDER — ORAL CARE MOUTH RINSE
15.0000 mL | OROMUCOSAL | Status: DC
  Administered 2024-02-27 – 2024-02-28 (×3): 15 mL via OROMUCOSAL

## 2024-02-27 MED ORDER — ADULT MULTIVITAMIN W/MINERALS CH
1.0000 | ORAL_TABLET | Freq: Every day | ORAL | Status: DC
  Administered 2024-02-27: 1 via ORAL
  Filled 2024-02-27: qty 1

## 2024-02-27 MED ORDER — DIVALPROEX SODIUM 250 MG PO DR TAB: 250.0000 mg | DELAYED_RELEASE_TABLET | ORAL | Status: DC

## 2024-02-27 MED ORDER — ALPRAZOLAM 0.5 MG PO TABS
0.5000 mg | ORAL_TABLET | Freq: Two times a day (BID) | ORAL | Status: DC
  Administered 2024-02-27 – 2024-02-28 (×3): 0.5 mg via ORAL
  Filled 2024-02-27 (×3): qty 1

## 2024-02-27 MED ORDER — ISOSORBIDE MONONITRATE ER 30 MG PO TB24
30.0000 mg | ORAL_TABLET | Freq: Every day | ORAL | Status: DC
  Administered 2024-02-27 – 2024-02-28 (×2): 30 mg via ORAL
  Filled 2024-02-27 (×2): qty 1

## 2024-02-27 NOTE — Progress Notes (Addendum)
 PROGRESS NOTE   Susan Davidson  ZOX:[id number] DOB: 05-15-1951 DOA: 02/25/2024 PCP: Roslyn Coombe, MD   Chief Complaint  Patient presents with   Dysphagia   Level of care: Telemetry  Brief Admission History:  73 year old female with history of stroke 01/2021, coronary artery disease with NSTEMI in 07/2023 treated medically, cerebral aneurysm (5 mm ACA aneurysm noted on brain MRI 05/2023), severe dementia, hyperlipidemia, stage 3a CKD, HTN, chronic dysphagia, HFpEF with grade 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  She was initially seen in ED and had a negative workup and subsequently sent back home.  Husband brought her back to ED several hours later reporting that she is still having difficulty managing her secretions and choking when trying to drink water and not able to eat or drink without choking spells.  He said that this really has progressively worsened over last few weeks as he has been having more difficulty with getting her to take her medications.  She has required suctioning in the ED on several occasions.  CXR not showing infiltrates but she has had some drop in pulse ox to 90% and with her ongoing difficult swallowing she will need a forma speech therapy evaluation and admission was requested for further management.     Assessment and Plan:  Chronic Dysphagia with acute worsening  Choking episodes recurrent aspiration events Aspiration pneumonia  - Pt has been evaluated by speech therapist and Dys 3 diet with NTL ordered; see notes, MBS planned for later today; appreciate SLP team recommendations and assessments.   - Aspiration precautions advised.  - suction intermittently as needed at bedside - agree with IV ampicillin/sulbactam as ordered   Severe Dementia - resumed home medication - delirium precautions advised  - pt still  full code, will ask for palliative consult for goals of care especially in light of issues with swallowing, drinking and eating and taking pills.   DNR - I received a message from Alva Auer with [facility] home hosp[facility] program - she made me aware that patient is DNR from their outpatient palliative discussions with husband and patient together and she is faxing her MOST and [facility] DNR form for us  to scan into EPIC documents - I connected Rhonda with my case manager to have the forms faxed over and scanned - we will honor her DNR while in the hospital and I have updated and changed the order already to reflect DNR  CKD stage 3a  - stable, follow  CAD s/p NSTEMI - resume home cardiac medications    Hypothyroidism  - resumed home levothyroxine     DVT prophylaxis: enoxaparin    Code Status: received message from [facility] hospice that patient is DNR with MOST form and golden[facility] being faxed over  Family Communication: husband at bedside 5/25 Disposition Plan: home, considering outpatient palliative    Consults called: SLP and palliative care   Admission status: INP  Consultants:  Palliative  Speech therapist  Procedures:   Antimicrobials:    Subjective: Pt reports that she is not hungry or thirsty.     Objective: Vitals:   02/26/24 2040 02/26/24 2055 02/26/24 2344 02/27/24 0348  BP:  (!) 172/76 (!) 161/74 (!) 157/85  Pulse: 86  86 84  Resp: 19 16 16 16   Temp:  97.9 F (36.6 C) 98.5 F (36.9 C) 98.4 F (36.9 C)  TempSrc:   Oral Oral  SpO2: 98% 93% 93% 96%  Weight:  60.2 kg      Intake/Output Summary (Last 24 hours) at 02/27/2024 1221 Last data filed at 02/27/2024 0800 Gross per 24 hour  Intake 968.52 ml  Output 200 ml  Net 768.52 ml   Filed Weights   02/26/24 2055  Weight: 60.2 kg   Examination:  General exam: frail, elderly female, with advanced dementia. Cachectic, Appears calm and comfortable, very dry mucus membranes.   Respiratory system: Clear to  auscultation. Respiratory effort normal. Cardiovascular system: normal S1 & S2 heard. No JVD, murmurs, rubs, gallops or clicks. No pedal edema. Gastrointestinal system: Abdomen is nondistended, soft and nontender. No organomegaly or masses felt. Normal bowel sounds heard. Central nervous system: Alert and disoriented. No focal neurological deficits. Extremities: Symmetric 5 x 5 power. Skin: No rashes, lesions or ulcers. Psychiatry: Judgement and insight appear severely diminished. Mood & affect appropriate.   Data Reviewed: I have personally reviewed following labs and imaging studies  CBC: Recent Labs  Lab 02/24/24 2354 02/26/24 0246  WBC 7.6 11.2*  NEUTROABS 4.8  --   HGB 14.2 12.4  HCT 43.6 39.1  MCV 102.8* 101.8*  PLT 164 152    Basic Metabolic Panel: Recent Labs  Lab 02/24/24 2354 02/26/24 0246  NA 137 139  K 3.8 3.5  CL 108 107  CO2 24 25  GLUCOSE 120* 122*  BUN 21 23  CREATININE 0.92 0.91  CALCIUM  8.7* 8.2*    CBG: No results for input(s): "GLUCAP" in the last 168 hours.  Recent Results (from the past 240 hours)  MRSA Next Gen by PCR, Nasal     Status: None   Collection Time: 02/25/24 12:10 PM   Specimen: Nasal Mucosa; Nasal Swab  Result Value Ref Range Status   MRSA by PCR Next Gen NOT DETECTED NOT DETECTED Final    Comment: (NOTE) The GeneXpert MRSA Assay (FDA approved for NASAL specimens only), is one component of a comprehensive MRSA colonization surveillance program. It is not intended to diagnose MRSA infection nor to guide or monitor treatment for MRSA infections. Test performance is not FDA approved in patients less than 74 years old. Performed at [facility], [address], [place], [place] [address]      Radiology Studies: No results found.  Scheduled Meds:  ALPRAZolam   0.5 mg Oral BID   aspirin  EC  81 mg Oral Daily   Chlorhexidine  Gluconate Cloth  6 each Topical Q0600   clopidogrel   75 mg Oral Daily   divalproex   250 mg Oral Daily    And   divalproex   500 mg Oral QHS   enoxaparin  (LOVENOX ) injection  40 mg Subcutaneous Q24H   folic acid   500 mcg Oral QHS   isosorbide  mononitrate  30 mg Oral Daily   lactose free nutrition  237 mL Oral BID BM   levothyroxine   100 mcg Oral Q0600   linaclotide   145 mcg Oral Daily   memantine   5 mg Oral BID   metoprolol  tartrate  12.5 mg Oral BID   multivitamin with minerals  1 tablet Oral QHS   mouth rinse  15 mL Mouth Rinse 4 times per day   PARoxetine   40 mg Oral q morning   QUEtiapine   50 mg Oral QHS   rosuvastatin   20 mg Oral QHS   Continuous Infusions:   LOS: 2 days   Time spent: 55 mins  Jahnessa Vanduyn, MD How to contact the  TRH Attending or Consulting provider 7A - 7P or covering provider during after hours 7P -7A, for this patient?  Check the care team in [facility] and look for a) attending/consulting TRH provider listed and b) the TRH team listed Log into [email or web address] to find provider on call.  Locate the TRH provider you are looking for under [facility] and page to a number that you can be directly reached. If you still have difficulty reaching the provider, please page the [facility] (Director on Call) for the Hospitalists listed on amion for assistance.  02/27/2024, 12:21 PM

## 2024-02-27 NOTE — Plan of Care (Signed)
  Problem: Acute Rehab PT Goals(only PT should resolve) Goal: Pt will Roll Supine to Side Outcome: Progressing Flowsheets (Taken 02/27/2024 1022) Pt will Roll Supine to Side: with min assist Goal: Pt Will Go Supine/Side To Sit Outcome: Progressing Flowsheets (Taken 02/27/2024 1022) Pt will go Supine/Side to Sit: with moderate assist Goal: Patient Will Perform Sitting Balance Outcome: Progressing Flowsheets (Taken 02/27/2024 1022) Patient will perform sitting balance: with minimal assist Goal: Patient Will Transfer Sit To/From Stand Outcome: Progressing Flowsheets (Taken 02/27/2024 1022) Patient will transfer sit to/from stand: with moderate assist Goal: Pt Will Transfer Bed To Chair/Chair To Bed Outcome: Progressing Flowsheets (Taken 02/27/2024 1022) Pt will Transfer Bed to Chair/Chair to Bed: with mod assist    10:24 AM, 02/27/24 Susan Davidson, PT, DPT [facility] with [facility]

## 2024-02-27 NOTE — Progress Notes (Signed)
 Palliative:  Mrs. Woodson is sitting up in the geri chair.  She greets me, making and somewhat keeping eye contact.  She is alert and oriented to self and place.  I believe that she can make her needs known. There is no family at bedside at this time.    Mrs. Riel continue to share that she feels that she is getting better.  She states her husband was at bedside earlier and they feel they can take her home with services already in place.  Reassurances provided.    Call to husband, Jamea Robicheaux.  No answer, left VM message. Mrs. Mcneff was to be connected with [facility] for out patient palliative services in August of 2024.  Unsure if they received these services.   Conference with transition of care team related to patient condition, needs, disposition.    Plan: Continue full scope/full code.  Home with home health/aide services already in place.  Would benefit from outpatient palliative services, was to be connected with [facility] in August 2024.  Would benefit from further support.  35 minutes Arla Lab, NP Palliative Medicine Team  Team phone [phone]

## 2024-02-27 NOTE — Evaluation (Signed)
 Physical Therapy Evaluation Patient Details Name: Susan Davidson MRN: [id number] DOB: [birth year] Today's Date: 02/27/2024  History of Present Illness  Susan KORPI is a 73 year old female with history of stroke 01/2021, coronary artery disease with NSTEMI in 07/2023 treated medically, cerebral aneurysm (5 mm ACA aneurysm noted on brain MRI 05/2023), severe dementia, hyperlipidemia, stage 3a CKD, HTN, chronic dysphagia, HFpEF with grade 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  She was initially seen in ED and had a negative workup and subsequently sent back home.  Husband brought her back to ED several hours later reporting that she is still having difficulty managing her secretions and choking when trying to drink water and not able to eat or drink without choking spells.  He said that this really has progressively worsened over last few weeks as he has been having more difficulty with getting her to take her medications.  She has required suctioning in the ED on several occasions.  CXR not showing infiltrates but she has had some drop in pulse ox to 90% and with her ongoing difficult swallowing she will need a forma speech therapy evaluation and admission was requested for further management.   Clinical Impression  Patient was agreeable to PT Evaluation but limited due to fatigue. Patient poor historian but reports that at baseline she receives assist from home health aide or husband for all mobility, ADLs, and iADLs. Reports "its been a long time" since she was able to ambulate. She reports she has assistance/supervision 24/7. Patient was received with nursing staff in room. Mod-max A was needed for rolling in bed during pericare, max A for supine>sit, max A for squat pivot and adjusting position in recliner. Patient presents with generalized weakness but  L>R due to previous CVA that left her with L hemiparesis. Patient will benefit from continued skilled physical therapy acutely and in recommended venue with confirmation of 24/7 supervision/care at home in order to address the above and improve pt level of function. Patient was left in recliner with call bell in reach and chair alarm activated. Nursing staff notified.        If plan is discharge home, recommend the following: A lot of help with bathing/dressing/bathroom;A lot of help with walking and/or transfers;Assistance with cooking/housework;Assist for transportation;Direct supervision/assist for medications management;Help with stairs or ramp for entrance   Can travel by private vehicle        Equipment Recommendations None recommended by PT  Recommendations for Other Services       Functional Status Assessment Patient has had a recent decline in their functional status and demonstrates the ability to make significant improvements in function in a reasonable and predictable amount of time.     Precautions / Restrictions Precautions Precautions: Fall Recall of Precautions/Restrictions: Impaired Restrictions Weight Bearing Restrictions Per Provider Order: No      Mobility  Bed Mobility Overal bed mobility: Needs Assistance Bed Mobility: Rolling, Supine to Sit Rolling: Max assist, Mod assist   Supine to sit: Max assist     General bed mobility comments: Slow, labored movement throughout mobility. Mod-max A for rolling to L during pericare with nursing staff. Max A for LE and trunk control during supine to sit. Pt can initiates movement but unable to complete due to weakness.    Transfers Overall transfer level: Needs assistance Equipment used: None Transfers:  Bed to chair/wheelchair/BSC       Squat pivot transfers: Max assist, Total assist     General transfer comment: Slow, labored movement throughout transfer. Max-Total assist via squat pivot bed>recliner with  gait belt.    Ambulation/Gait     General Gait Details: Unable to assess. Pt non-ambulatory at baseline. w/c main mode of transport.  Stairs        Wheelchair Mobility     Tilt Bed    Modified Rankin (Stroke Patients Only)       Balance Overall balance assessment: Needs assistance Sitting-balance support: Single extremity supported, Feet supported, No upper extremity supported Sitting balance-Leahy Scale: Fair Sitting balance - Comments: Poor/fair seated EOB. Pt can maintain balance for a few seconds but tends to lean in various directions with inc time. Postural control: Posterior lean, Left lateral lean Standing balance support: During functional activity Standing balance-Leahy Scale: Poor Standing balance comment: Poor during squat pivot transfer. MAx A       Pertinent Vitals/Pain Pain Assessment Pain Assessment: No/denies pain    Home Living Family/patient expects to be discharged to:: Private residence Living Arrangements: Spouse/significant other Available Help at Discharge: Family;Available 24 hours/day;Personal care attendant Type of Home: House Home Access: Stairs to enter   [employer] of Steps: 5   Home Layout: One level Home Equipment: Wheelchair - manual;Lift chair;BSC/3in1;Hospital bed;Rolling Walker (2 wheels) Additional Comments: Pt reports no change in living arrangement since last admission to hospital    Prior Function Prior Level of Function : Needs assist       Physical Assist : Mobility (physical);ADLs (physical) Mobility (physical): Bed mobility;Transfers;Gait;Stairs ADLs (physical): Grooming;Bathing;Dressing;Toileting;IADLs Mobility Comments: Pt reports no change in mobility since last admission. Pt endorses max-total assist from husband or aide via squat pivot from bed>w/c ADLs Comments: Pt reports assist needed for ADLs via husband and aide. Pt reports having 24/7 care either from aide or husband     Extremity/Trunk  Assessment   Upper Extremity Assessment Upper Extremity Assessment: Generalized weakness;LUE deficits/detail LUE Deficits / Details: L sided hemiparesis due to previous CVA LUE Coordination: decreased gross motor;decreased fine motor    Lower Extremity Assessment Lower Extremity Assessment: Overall WFL for tasks assessed;LLE deficits/detail LLE Deficits / Details: L sided hemiparesis due to previous CVA LLE Coordination: decreased gross motor;decreased fine motor    Cervical / Trunk Assessment Cervical / Trunk Assessment: Kyphotic  Communication   Communication Communication: No apparent difficulties    Cognition Arousal: Alert Behavior During Therapy: WFL for tasks assessed/performed   PT - Cognitive impairments: History of cognitive impairments, No family/caregiver present to determine baseline       PT - Cognition Comments: Pt poor historian. History of dementia. Pt alert, pleasant, and agreeable during session. Following commands: Intact       Cueing Cueing Techniques: Verbal cues, Tactile cues     General Comments      Exercises     Assessment/Plan    PT Assessment Patient needs continued PT services;All further PT needs can be met in the next venue of care  PT Problem List Decreased strength;Decreased activity tolerance;Decreased balance;Decreased mobility;Decreased cognition       PT Treatment Interventions Functional mobility training;Therapeutic activities;Therapeutic exercise;Balance training;Neuromuscular re-education;Patient/family education    PT Goals (Current goals can be found in the Care Plan section)  Acute Rehab PT Goals Patient Stated Goal: Return home with appropriate support PT Goal Formulation: With patient Time For Goal Achievement: 03/01/24 Potential to Achieve Goals: Good    Frequency  Min 3X/week     Co-evaluation               AM-PAC PT "6 Clicks" Mobility  Outcome Measure Help needed turning from your back to your side  while in a flat bed without using bedrails?: A Lot Help needed moving from lying on your back to sitting on the side of a flat bed without using bedrails?: A Lot Help needed moving to and from a bed to a chair (including a wheelchair)?: A Lot Help needed standing up from a chair using your arms (e.g., wheelchair or bedside chair)?: A Lot Help needed to walk in hospital room?: A Lot Help needed climbing 3-5 steps with a railing? : Total 6 Click Score: 11    End of Session Equipment Utilized During Treatment: Gait belt;Oxygen Activity Tolerance: Patient limited by fatigue;Patient tolerated treatment well Patient left: in chair;with call bell/phone within reach;with chair alarm set Nurse Communication: Mobility status PT Visit Diagnosis: Other abnormalities of gait and mobility (R26.89);Muscle weakness (generalized) (M62.81);Hemiplegia and hemiparesis Hemiplegia - Right/Left: Left Hemiplegia - caused by: Cerebral infarction    Time: 7829-5621 PT Time Calculation (min) (ACUTE ONLY): 25 min   Charges:   PT Evaluation $PT Eval Moderate Complexity: 1 Mod PT Treatments $Therapeutic Activity: 23-37 mins PT General Charges $$ ACUTE PT VISIT: 1 Visit        10:20 AM, 02/27/24 Marysue Sola, PT, DPT [place] with [facility]

## 2024-02-27 NOTE — Progress Notes (Signed)
 This nurse assigned to patient

## 2024-02-27 NOTE — Plan of Care (Signed)

## 2024-02-27 NOTE — TOC Initial Note (Signed)
 Transition of Care [facility]) - Initial/Assessment Note    Patient Details  Name: Susan Davidson MRN: [id number] Date of Birth: 09-Sep-1951  Transition of Care [facility]) CM/SW Contact:    Grandville Lax, LCSWA Phone Number: 02/27/2024, 11:42 AM  Clinical Narrative:                 Pt is high risk for readmission. CSW spoke with pts spouse who is at bedside to complete assessment. Pt has assistance with ADLs by aide that is in home 5 days a week. Pts spouse provides transportation when needed. Pt has had HH with [facility] in the past and pts spouse states they are agreeable to this again as PT has recommended this. Pt has a wheelchair that she uses in home. TOC to follow.   Expected Discharge Plan: Home w Home Health Services Barriers to Discharge: Continued Medical Work up   Patient Goals and CMS Choice Patient states their goals for this hospitalization and ongoing recovery are:: return home CMS Medicare.gov Compare Post Acute Care list provided to:: Patient Represenative (must comment) Choice offered to / list presented to : Spouse      Expected Discharge Plan and Services In-house Referral: Clinical Social Work Discharge Planning Services: CM Consult Post Acute Care Choice: Home Health Living arrangements for the past 2 months: Single Family Home                                      Prior Living Arrangements/Services Living arrangements for the past 2 months: Single Family Home Lives with:: Self, Spouse Patient language and need for interpreter reviewed:: Yes Do you feel safe going back to the place where you live?: Yes      Need for Family Participation in Patient Care: Yes (Comment) Care giver support system in place?: Yes (comment) Current home services: DME Criminal Activity/Legal Involvement Pertinent to Current Situation/Hospitalization: No - Comment as needed  Activities of Daily Living   ADL Screening (condition at time of admission) Independently performs ADLs?:  No Does the patient have a NEW difficulty with bathing/dressing/toileting/self-feeding that is expected to last >3 days?: Yes (Initiates electronic notice to provider for possible OT consult) Does the patient have a NEW difficulty with getting in/out of bed, walking, or climbing stairs that is expected to last >3 days?: Yes (Initiates electronic notice to provider for possible PT consult) Does the patient have a NEW difficulty with communication that is expected to last >3 days?: No Is the patient deaf or have difficulty hearing?: No Does the patient have difficulty seeing, even when wearing glasses/contacts?: No Does the patient have difficulty concentrating, remembering, or making decisions?: No  Permission Sought/Granted                  Emotional Assessment Appearance:: Appears stated age       Alcohol / Substance Use: Not Applicable Psych Involvement: No (comment)  Admission diagnosis:  Choking [T17.308A] Acute respiratory failure with hypoxia (HCC) [J96.01] Dysphagia [R13.10] Aspiration into airway, initial encounter [T17.908A] Dysphagia, unspecified type [R13.10] Patient Active Problem List   Diagnosis Date Noted   Choking 02/25/2024   Cervical disc disorder 10/02/2023   Dementia without behavioral disturbance (HCC) 08/03/2023   History of dysphagia 08/03/2023   Elevated MCV 08/03/2023   NSTEMI (non-ST elevated myocardial infarction) (HCC) 08/02/2023   Macrocytic anemia 07/31/2023   Chronic idiopathic constipation 07/31/2023   Loss of appetite  07/31/2023   Dysphagia 05/29/2023   Community acquired pneumonia 05/17/2023   Acute encephalopathy 05/17/2023   Elevated serum free T4 level 05/17/2023   Epigastric pain 04/11/2023   Aneurysm (HCC) 12/12/2022   Left arm weakness 12/12/2022   Confusion 01/08/2022   Bipolar illness (HCC) 01/06/2022   Gross hematuria 11/29/2021   Bilateral hearing loss 10/29/2021   History of stroke 10/29/2021   Nail disorder 10/29/2021    Unresponsiveness 08/02/2021   Acute metabolic encephalopathy 05/04/2021   CKD (chronic kidney disease) stage 3, GFR 30-59 ml/min (HCC) 05/04/2021   COVID-19 virus infection 05/04/2021   Lower urinary tract infectious disease    Generalized weakness 01/27/2021   Spinal stenosis at L4-L5 level 01/27/2021   CVA (cerebral vascular accident) (HCC) 01/27/2021   Ambulatory dysfunction 01/26/2021   Tobacco use disorder 01/26/2021   CKD (chronic kidney disease) 11/15/2020   B12 deficiency 11/09/2020   Urinary frequency 11/02/2018   Daytime somnolence 10/19/2012   Impaired glucose tolerance 08/27/2011   Encounter for well adult exam with abnormal findings 08/27/2011   Vitamin D  deficiency 04/14/2010   HYPERSOMNIA 02/19/2009   Acquired hypothyroidism 02/12/2008   HLD (hyperlipidemia) 02/12/2008   Anxiety state 02/12/2008   Depression 02/12/2008   Essential hypertension 02/12/2008   Osteopenia 02/12/2008   PCP:  Roslyn Coombe, MD Pharmacy:   [facility] - [place], [place] - [address] [place] [address] Phone: [phone] Fax: [phone]     Social Drivers of Health (SDOH) Social History: SDOH Screenings   Food Insecurity: No Food Insecurity (02/25/2024)  Housing: Low Risk  (02/25/2024)  Transportation Needs: No Transportation Needs (02/25/2024)  Utilities: Not At Risk (02/25/2024)  Alcohol Screen: Low Risk  (04/27/2023)  Depression (PHQ2-9): Low Risk  (02/01/2024)  Financial Resource Strain: Low Risk  (04/27/2023)  Physical Activity: Inactive (04/27/2023)  Social Connections: Socially Isolated (02/25/2024)  Stress: Stress Concern Present (04/27/2023)  Tobacco Use: Medium Risk (02/26/2024)  Health Literacy: Adequate Health Literacy (04/27/2023)   SDOH Interventions:     Readmission Risk Interventions    02/27/2024   11:41 AM 02/26/2024    8:51 AM  Readmission Risk Prevention Plan  Transportation Screening Complete Complete   Home Care Screening  Complete  Medication Review (RN CM)  Complete  HRI or Home Care Consult Complete   Social Work Consult for Recovery Care Planning/Counseling Complete   Palliative Care Screening Not Applicable   Medication Review Oceanographer) Complete

## 2024-02-27 NOTE — Progress Notes (Signed)
 Modified Barium Swallow Study  Patient Details  Name: Susan Davidson MRN: [id number] Date of Birth: 16-Nov-1950  Today's Date: 02/27/2024  Modified Barium Swallow completed.  Full report located under Chart Review in the Imaging Section.  History of Present Illness Susan Davidson is a 73 year old female with history of stroke 01/2021, coronary artery disease with NSTEMI in 07/2023 treated medically, cerebral aneurysm (5 mm ACA aneurysm noted on brain MRI 05/2023), severe dementia, hyperlipidemia, stage 3a CKD, HTN, chronic dysphagia, HFpEF with grade 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  She was initially seen in ED and had a negative workup and subsequently sent back home.  Husband brought her back to ED several hours later reporting that she is still having difficulty managing her secretions and choking when trying to drink water and not able to eat or drink without choking spells.  He said that this really has progressively worsened over last few weeks as he has been having more difficulty with getting her to take her medications.  She has required suctioning in the ED on several occasions.  CXR not showing infiltrates but she has had some drop in pulse ox to 90% and with her ongoing difficult swallowing she will need a forma speech therapy evaluation and admission was requested for further management. BSE requested   Clinical Impression Pt presents with moderate oral phase dysphagia and mi/mod pharyngeal phase dysphagia characterized by reduced lip closure resulting in labial spillage of thins, significant oral holding of cup sips (thin and NTL) for up to 20 seconds despite cues to "swallow/move tongue", repetitive and disorganized lingual movement with prolonged oral transit with solids, swallow trigger typically after filling the valleculae for  liquids, reduced laryngeal vestibule closure with variable, trace penetration of thins and nectars before and during the swallow, but no aspiration observed. Pt with prominent cricopharyngeus with occasional obstruction of bolus flow and residuals near UES. Pt cued to swallow a second time and this cleared any residuals. Pt was given the barium tablet with cup sip thin liquid and she held the bolus for 20+ seconds, but did eventually swallow. Esophageal sweep revealed mild retention of barium in the distal esophagus. Pt did not cough or clear her throat at any point during today's evaluation. She did present with one episode of mild wet vocal quality after penetration of tsp presentation thin and a cued throat clear removed penetrated material and improved vocal quality. Pt presented differently today than when seen at bedside (oral holding was not noted at bedside, however Pt with some coughing episodes at bedside per RN during meals). Pt positioning is important for this Pt, as she was sitting completely upright today without leaning. Ideally, Pt will be seated up in a chair for all meals or as upright as possible in her bed with pillows to keep her from leaning. Recommend D3/mech soft and thin liquids via cup/straw with Pt sitting fully upright and PO medication whole or crushed in puree and follow with liquid wash. SLP will follow x1 in acute setting.  Factors that may increase risk of adverse event in presence of aspiration Roderick Civatte & Jessy Morocco 2021): Frail or deconditioned;Reduced cognitive function  Swallow Evaluation Recommendations Recommendations: PO diet PO Diet Recommendation: Dysphagia 3 (Mechanical soft);Thin liquids (Level 0) Liquid Administration via: Cup;Straw Medication Administration: Whole meds with puree Supervision: Patient able to self-feed;Staff to  assist with self-feeding;Full supervision/cueing for swallowing strategies Swallowing strategies  : Slow rate;Multiple dry swallows after  each bite/sip Postural changes: Position pt fully upright for meals;Stay upright 30-60 min after meals;Out of bed for meals (ideally out of bed for meals) Oral care recommendations: Oral care BID (2x/day);Staff/trained caregiver to provide oral care     Thank you,  Claudetta Cuba, CCC-SLP [phone]  Milana Salay 02/27/2024,5:30 PM

## 2024-02-28 DIAGNOSIS — R1310 Dysphagia, unspecified: Secondary | ICD-10-CM | POA: Diagnosis not present

## 2024-02-28 DIAGNOSIS — J690 Pneumonitis due to inhalation of food and vomit: Secondary | ICD-10-CM

## 2024-02-28 DIAGNOSIS — J9601 Acute respiratory failure with hypoxia: Secondary | ICD-10-CM

## 2024-02-28 DIAGNOSIS — N1831 Chronic kidney disease, stage 3a: Secondary | ICD-10-CM | POA: Diagnosis not present

## 2024-02-28 DIAGNOSIS — T17908A Unspecified foreign body in respiratory tract, part unspecified causing other injury, initial encounter: Secondary | ICD-10-CM

## 2024-02-28 LAB — CBC WITH DIFFERENTIAL/PLATELET
Abs Immature Granulocytes: 0.04 10*3/uL (ref 0.00–0.07)
Basophils Absolute: 0 10*3/uL (ref 0.0–0.1)
Basophils Relative: 1 %
Eosinophils Absolute: 0.1 10*3/uL (ref 0.0–0.5)
Eosinophils Relative: 2 %
HCT: 34.7 % — ABNORMAL LOW (ref 36.0–46.0)
Hemoglobin: 11 g/dL — ABNORMAL LOW (ref 12.0–15.0)
Immature Granulocytes: 1 %
Lymphocytes Relative: 30 %
Lymphs Abs: 2 10*3/uL (ref 0.7–4.0)
MCH: 32.5 pg (ref 26.0–34.0)
MCHC: 31.7 g/dL (ref 30.0–36.0)
MCV: 102.7 fL — ABNORMAL HIGH (ref 80.0–100.0)
Monocytes Absolute: 0.4 10*3/uL (ref 0.1–1.0)
Monocytes Relative: 7 %
Neutro Abs: 4 10*3/uL (ref 1.7–7.7)
Neutrophils Relative %: 59 %
Platelets: 151 10*3/uL (ref 150–400)
RBC: 3.38 MIL/uL — ABNORMAL LOW (ref 3.87–5.11)
RDW: 15.6 % — ABNORMAL HIGH (ref 11.5–15.5)
WBC: 6.6 10*3/uL (ref 4.0–10.5)
nRBC: 0 % (ref 0.0–0.2)

## 2024-02-28 LAB — BASIC METABOLIC PANEL WITH GFR
Anion gap: 5 (ref 5–15)
BUN: 17 mg/dL (ref 8–23)
CO2: 28 mmol/L (ref 22–32)
Calcium: 8.1 mg/dL — ABNORMAL LOW (ref 8.9–10.3)
Chloride: 107 mmol/L (ref 98–111)
Creatinine, Ser: 0.83 mg/dL (ref 0.44–1.00)
GFR, Estimated: >60 mL/min (ref >60)
Glucose, Bld: 97 mg/dL (ref 70–99)
Potassium: 3.4 mmol/L — ABNORMAL LOW (ref 3.5–5.1)
Sodium: 140 mmol/L (ref 135–145)

## 2024-02-28 LAB — MAGNESIUM: Magnesium: 2.2 mg/dL (ref 1.7–2.4)

## 2024-02-28 MED ORDER — AMOXICILLIN-POT CLAVULANATE 400-57 MG/5ML PO SUSR
800.0000 mg | Freq: Two times a day (BID) | ORAL | Status: DC
  Filled 2024-02-28 (×2): qty 10

## 2024-02-28 MED ORDER — AMOXICILLIN-POT CLAVULANATE 400-57 MG/5ML PO SUSR: 800.0000 mg | Freq: Two times a day (BID) | ORAL | 0 refills | Status: DC

## 2024-02-28 NOTE — Progress Notes (Signed)
 Speech Language Pathology Treatment: Dysphagia  Patient Details Name: Susan Davidson MRN: [id number] DOB: 30-Oct-1950 Today's Date: 02/28/2024 Time: 4010-2725 SLP Time Calculation (min) (ACUTE ONLY): 21 min  Assessment / Plan / Recommendation Clinical Impression  Ongoing diagnostic dysphagia therapy provided; Pt was upright in chair. Provided sips of thin liquids via straw and continue to note significant oral holding despite mod/max verbal and tactile cues. Pt without overt s/sx of aspiration when she does swallow, however suspect delayed swallowing initiation. Echo recommendations from MBSS yesterday: "positioning is important for this Pt... Ideally, Pt will be seated up in a chair for all meals or as upright as possible in her bed with pillows to keep her from leaning. Recommend D3/mech soft and thin liquids via cup/straw with Pt sitting fully upright and PO medication whole or crushed in puree and follow with liquid wash". Further recommend check oral cavity after meals to ensure residue has been swallowed. Reviewed above with nurse tech and nurse. No further ST needs indicated at this time, our service will sign off. Thank you for this referral,    HPI HPI: Susan Davidson is a 73 year old female with history of stroke 01/2021, coronary artery disease with NSTEMI in 07/2023 treated medically, cerebral aneurysm (5 mm ACA aneurysm noted on brain MRI 05/2023), severe dementia, hyperlipidemia, stage 3a CKD, HTN, chronic dysphagia, HFpEF with grade 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  She was initially seen in ED and had a negative workup and subsequently sent back home.  Husband brought her back to ED several hours later reporting that she is still having difficulty managing her secretions and choking when trying to drink water and not able to eat or drink  without choking spells.  He said that this really has progressively worsened over last few weeks as he has been having more difficulty with getting her to take her medications.  She has required suctioning in the ED on several occasions.  CXR not showing infiltrates but she has had some drop in pulse ox to 90% and with her ongoing difficult swallowing she will need a forma speech therapy evaluation and admission was requested for further management. BSE requested      SLP Plan  Discharge SLP treatment due to (comment)      Recommendations for follow up therapy are one component of a multi-disciplinary discharge planning process, led by the attending physician.  Recommendations may be updated based on patient status, additional functional criteria and insurance authorization.    Recommendations  Diet recommendations: Dysphagia 3 (mechanical soft);Thin liquid Liquids provided via: Cup;Straw Medication Administration: Whole meds with puree Supervision: Patient able to self feed Compensations: Slow rate;Small sips/bites;Multiple dry swallows after each bite/sip;Clear throat intermittently;Use straw to facilitate chin tuck Postural Changes and/or Swallow Maneuvers: Seated upright 90 degrees                  Oral care BID;Oral care prior to ice chip/H20;Staff/trained caregiver to provide oral care     Dysphagia, oropharyngeal phase (R13.12)     Discharge SLP treatment due to (comment)    Jebadiah Imperato H. Vergil Glasser, CCC-SLP Speech Language Pathologist  Florina Husbands  02/28/2024, 2:22 PM

## 2024-02-28 NOTE — TOC Transition Note (Signed)
 Transition of Care [facility]) - Discharge Note   Patient Details  Name: Susan Davidson MRN: [id number] Date of Birth: [birth year]  Transition of Care [facility]) CM/SW Contact:  Grandville Lax, LCSWA Phone Number: 02/28/2024, 3:24 PM  Clinical Narrative:    CSW updated that pt is medically stable for D/C home today. HH PT and speech therapy has been set up with [facility]. Pts spouse updated on plan by RN CM. MD placed HH orders. CSW updated Randel Buss with [facility] and they will follow pt in the community. TOC signing off.   Final next level of care: Home w Home Health Services Barriers to Discharge: Barriers Resolved   Patient Goals and CMS Choice Patient states their goals for this hospitalization and ongoing recovery are:: return home CMS Medicare.gov Compare Post Acute Care list provided to:: Patient Represenative (must comment) Choice offered to / list presented to : Spouse      Discharge Placement                       Discharge Plan and Services Additional resources added to the After Visit Summary for   In-house Referral: Clinical Social Work Discharge Planning Services: CM Consult Post Acute Care Choice: Home Health                    HH Arranged: PT, Speech Therapy HH Agency: [facility] Date [facility] Agency Contacted: 02/28/24   Representative spoke with at [facility] Agency: Randel Buss  Social Drivers of Health (SDOH) Interventions SDOH Screenings   Food Insecurity: No Food Insecurity (02/25/2024)  Housing: Low Risk  (02/25/2024)  Transportation Needs: No Transportation Needs (02/25/2024)  Utilities: Not At Risk (02/25/2024)  Alcohol Screen: Low Risk  (04/27/2023)  Depression (PHQ2-9): Low Risk  (02/01/2024)  Financial Resource Strain: Low Risk  (04/27/2023)  Physical Activity: Inactive (04/27/2023)  Social Connections: Socially Isolated (02/25/2024)  Stress: Stress Concern Present (04/27/2023)  Tobacco Use: Medium Risk (02/26/2024)  Health Literacy: Adequate Health Literacy  (04/27/2023)     Readmission Risk Interventions    02/27/2024   11:41 AM 02/26/2024    8:51 AM  Readmission Risk Prevention Plan  Transportation Screening Complete Complete  Home Care Screening  Complete  Medication Review (RN CM)  Complete  HRI or Home Care Consult Complete   Social Work Consult for Recovery Care Planning/Counseling Complete   Palliative Care Screening Not Applicable   Medication Review Oceanographer) Complete

## 2024-02-28 NOTE — Discharge Summary (Signed)
 Physician Discharge Summary   Patient: Susan Davidson MRN: [id number] DOB: 27-Sep-1951  Admit date:     02/25/2024  Discharge date: 02/28/24  Discharge Physician: Myrtie Atkinson Jenelle Drennon   PCP: Roslyn Coombe, MD   Recommendations at discharge:   Please follow up with primary care provider within 1-2 weeks  Please repeat BMP and CBC in one week    Hospital Course: 73 year old female with history of stroke 01/2021, coronary artery disease with NSTEMI in 07/2023 treated medically, cerebral aneurysm (5 mm ACA aneurysm noted on brain MRI 05/2023), severe dementia, hyperlipidemia, stage 3a CKD, HTN, chronic dysphagia, HFpEF with grade 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  She was initially seen in ED and had a negative workup and subsequently sent back home.  Husband brought her back to ED several hours later reporting that she is still having difficulty managing her secretions and choking when trying to drink water and not able to eat or drink without choking spells.  He said that this really has progressively worsened over last few weeks as he has been having more difficulty with getting her to take her medications.  She has required suctioning in the ED on several occasions.  CXR not showing infiltrates but she has had some drop in pulse ox to 90% and with her ongoing difficult swallowing she will need a forma speech therapy evaluation and admission was requested for further management.    Assessment and Plan: Chronic Dysphagia with acute worsening  Choking episodes recurrent aspiration events Aspiration pneumonia  - Pt has been evaluated by speech therapist and Dys 3 diet with thin liquids ordered; see notes,  -MBSS done appreciate SLP team recommendations and assessments.   - mitigation strategies as discussed with pt and spouse as per speech therapy  recommendations - Aspiration precautions advised.  - agree with IV ampicillin/sulbactam as ordered  - d/c home with amox/clav x 5 days - tolerating dys 3 diet at time of d/c without further choking   Major neurocognitive disorder - resumed home medication - delirium precautions advised  - initially full code, will ask for palliative consult for goals of care especially in light of issues with swallowing, drinking and eating and taking pills.    DNR - Dr. Lincoln Renshaw received a message from Alva Auer with [facility] - she made me aware that patient is DNR from their outpatient palliative discussions with husband and patient together and she is faxing her MOST and [employer] DNR form for us  to scan into EPIC documents - Dr. 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   Influenza Vac Split Quad Other (See Comments)   Fatigue, mimics a "super bad flu"        Medication List     TAKE these medications    acetaminophen  325 MG tablet Commonly known as: TYLENOL  Take 2 tablets (650 mg total) by mouth every 6 (six) hours as needed for mild pain or headache (fever >/= 101).   albuterol  108 (90 Base) MCG/ACT  inhaler Commonly known as: VENTOLIN  HFA Inhale 2 puffs into the lungs every 6 (six) hours as needed for wheezing or shortness of breath.   alendronate  70 MG tablet Commonly known as: FOSAMAX  Take 1 tablet (70 mg total) by mouth every 7 (seven) days. Take with a full glass of water on an empty stomach.   ALPRAZolam  0.5 MG tablet Commonly known as: XANAX  TAKE 1 TABLET TWICE DAILY AS NEEDED FOR ANXIETY What changed: See the new instructions.   amoxicillin -clavulanate 400-57 MG/5ML suspension Commonly known as: AUGMENTIN  Take 10 mLs (800 mg total) by mouth every 12 (twelve) hours.   ascorbic acid  500 MG tablet Commonly known as: VITAMIN C Take 1 tablet (500 mg total) by mouth daily. What changed: when to take this   aspirin  EC 81 MG tablet Take 1 tablet (81 mg total) by mouth daily. Swallow whole.   clopidogrel  75 MG tablet Commonly known as: PLAVIX  Take 1 tablet (75 mg total) by mouth daily.   COQ10 PO Take 1 tablet by mouth at bedtime.   divalproex  250 MG DR tablet Commonly known as: DEPAKOTE  Take 1 tablet by mouth See admin instructions. 1 tablet in the morning and 2 tablets every evening   folic acid  800 MCG tablet Commonly known as: FOLVITE  Take 400 mcg by mouth at bedtime.   isosorbide  mononitrate 30 MG 24 hr tablet Commonly known as: IMDUR  Take 1 tablet (30 mg total) by mouth daily.   lactose free nutrition Liqd Take 237 mLs by mouth 2 (two) times daily between meals. At least one a day and sometimes more   levothyroxine  100 MCG tablet Commonly known as: SYNTHROID  Take 1 tablet (100 mcg total) by mouth daily.   linaclotide  145 MCG Caps capsule Commonly known as: LINZESS  Take 1 capsule (145 mcg total) by mouth daily. What changed: when to take this   MAGNESIUM OXIDE (ELEMENTAL) PO Take 1 tablet by mouth at bedtime.   memantine  5 MG tablet Commonly known as: NAMENDA  Take 1 tablet (5 mg total) by mouth 2 (two) times daily.   metoprolol  tartrate 25 MG  tablet Commonly known as: LOPRESSOR  TAKE 1/2 TABLET TWICE DAILY   multivitamin tablet Take 1 tablet by mouth at bedtime.   nitroGLYCERIN  0.4 MG SL tablet Commonly known as: Nitrostat  Place 1 tablet (0.4 mg total) under the tongue every 5 (five) minutes as needed for chest pain. Up to 3 doses in one 24 hour period   pantoprazole  40 MG tablet Commonly known as: PROTONIX  Take 1 tablet (40 mg total) by mouth daily.   PARoxetine  40 MG tablet Commonly known as: PAXIL  Take 40 mg by mouth every morning.   QUEtiapine 50 MG tablet Commonly known as: SEROQUEL Take 50 mg by mouth at bedtime.   rosuvastatin  20 MG tablet Commonly known as: CRESTOR  TAKE 1 TABLET AT BEDTIME   VITAMIN D -3 PO Take 1 capsule by mouth at bedtime.        Discharge Exam: Filed Weights   02/26/24 2055  Weight:  60.2 kg   HEENT:  [place]/AT, No thrush, no icterus CV:  RRR, no rub, no S3, no S4 Lung:  bibasilar rales.  No wheeze Abd:  soft/+BS, NT Ext:  No edema, no lymphangitis, no synovitis, no rash   Condition at discharge: stable  The results of significant diagnostics from this hospitalization (including imaging, microbiology, ancillary and laboratory) are listed below for reference.   Imaging Studies: DG Swallowing Func-Speech Pathology Result Date: 02/27/2024 Table formatting from the original result was not included. Modified Barium Swallow Study Patient Details Name: Susan Davidson MRN: [id number] Date of Birth: 06/30/1951 Today's Date: 02/27/2024 HPI/PMH: HPI: RODNEY WIGGER is a 73 year old female with history of stroke 01/2021, coronary artery disease with NSTEMI in 07/2023 treated medically, cerebral aneurysm (5 mm ACA aneurysm noted on brain MRI 05/2023), severe dementia, hyperlipidemia, stage 3a CKD, HTN, chronic dysphagia, HFpEF with grade 1 DD, hypothyrodism, GERD, depression who was brought to ED by husband on 02/24/24 when she had a choking spell and possible aspiration event when he was giving her  medications that morning.  She was initially seen in ED and had a negative workup and subsequently sent back home.  Husband brought her back to ED several hours later reporting that she is still having difficulty managing her secretions and choking when trying to drink water and not able to eat or drink without choking spells.  He said that this really has progressively worsened over last few weeks as he has been having more difficulty with getting her to take her medications.  She has required suctioning in the ED on several occasions.  CXR not showing infiltrates but she has had some drop in pulse ox to 90% and with her ongoing difficult swallowing she will need a forma speech therapy evaluation and admission was requested for further management. BSE requested Clinical Impression: Clinical Impression: Pt presents with moderate oral phase dysphagia and mi/mod pharyngeal phase dysphagia characterized by reduced lip closure resulting in labial spillage of thins, significant oral holding of cup sips (thin and NTL) for up to 20 seconds despite cues to "swallow/move tongue", repetitive and disorganized lingual movement with prolonged oral transit with solids, swallow trigger typically after filling the valleculae for liquids, reduced laryngeal vestibule closure with variable, trace penetration of thins and nectars before and during the swallow, but no aspiration observed. Pt with prominent cricopharyngeus with occasional obstruction of bolus flow and residuals near UES. Pt cued to swallow a second time and this cleared any residuals. Pt was given the barium tablet with cup sip thin liquid and she held the bolus for 20+ seconds, but did eventually swallow. Esophageal sweep revealed mild retention of barium in the distal esophagus. Pt did not cough or clear her throat at any point during today's evaluation. She did present with one episode of mild wet vocal quality after penetration of tsp presentation thin and a cued  throat clear removed penetrated material and improved vocal quality. Pt presented differently today than when seen at bedside (oral holding was not noted at bedside, however Pt with some coughing episodes at bedside per RN during meals). Pt positioning is important for this Pt, as she was sitting completely upright today without leaning. Ideally, Pt will be seated up in a chair for all meals or as upright as possible in her bed with pillows to keep her from leaning. Recommend D3/mech soft and thin liquids via cup/straw with Pt sitting fully upright and PO medication whole or crushed in puree and  follow with liquid wash. SLP will follow x1 in acute setting. Factors that may increase risk of adverse event in presence of aspiration Roderick Civatte & Jessy Morocco 2021): Factors that may increase risk of adverse event in presence of aspiration Roderick Civatte & Jessy Morocco 2021): Frail or deconditioned; Reduced cognitive function Recommendations/Plan: Swallowing Evaluation Recommendations Swallowing Evaluation Recommendations Recommendations: PO diet PO Diet Recommendation: Dysphagia 3 (Mechanical soft); Thin liquids (Level 0) Liquid Administration via: Cup; Straw Medication Administration: Whole meds with puree Supervision: Patient able to self-feed; Staff to assist with self-feeding; Full supervision/cueing for swallowing strategies Swallowing strategies  : Slow rate; Multiple dry swallows after each bite/sip Postural changes: Position pt fully upright for meals; Stay upright 30-60 min after meals; Out of bed for meals (ideally out of bed for meals) Oral care recommendations: Oral care BID (2x/day); Staff/trained caregiver to provide oral care Caregiver Recommendations: Have oral suction available Treatment Plan Treatment Plan Treatment recommendations: Therapy as outlined in treatment plan below Follow-up recommendations: Follow physicians's recommendations for discharge plan and follow up therapies Functional status assessment: Patient has  had a recent decline in their functional status and demonstrates the ability to make significant improvements in function in a reasonable and predictable amount of time. Treatment frequency: Min 2x/week Treatment duration: 1 week Interventions: Aspiration precaution training; Compensatory techniques; Patient/family education; Diet toleration management by SLP Recommendations Recommendations for follow up therapy are one component of a multi-disciplinary discharge planning process, led by the attending physician.  Recommendations may be updated based on patient status, additional functional criteria and insurance authorization. Assessment: Orofacial Exam: Orofacial Exam Oral Cavity: Oral Hygiene: WFL Oral Cavity - Dentition: Adequate natural dentition; Missing dentition Orofacial Anatomy: WFL Oral Motor/Sensory Function: WFL Anatomy: Anatomy: Prominent cricopharyngeus Boluses Administered: Boluses Administered Boluses Administered: Thin liquids (Level 0); Mildly thick liquids (Level 2, nectar thick); Puree; Solid  Oral Impairment Domain: Oral Impairment Domain Lip Closure: Escape from interlabial space or lateral juncture, no extension beyond vermillion border Tongue control during bolus hold: Cohesive bolus between tongue to palatal seal Bolus preparation/mastication: Disorganized chewing/mashing with solid pieces of bolus unchewed Bolus transport/lingual motion: Repetitive/disorganized tongue motion Oral residue: Residue collection on oral structures Location of oral residue : Tongue Initiation of pharyngeal swallow : Valleculae  Pharyngeal Impairment Domain: Pharyngeal Impairment Domain Soft palate elevation: Trace column of contrast or air between SP and PW Laryngeal elevation: Complete superior movement of thyroid  cartilage with complete approximation of arytenoids to epiglottic petiole Anterior hyoid excursion: Complete anterior movement Epiglottic movement: Complete inversion Laryngeal vestibule closure:  Incomplete, narrow column air/contrast in laryngeal vestibule Pharyngeal stripping wave : Present - complete Pharyngeal contraction (A/P view only): N/A Pharyngoesophageal segment opening: Partial distention/partial duration, partial obstruction of flow Tongue base retraction: Narrow column of contrast or air between tongue base and PPW Pharyngeal residue: Collection of residue within or on pharyngeal structures Location of pharyngeal residue: Valleculae; Pyriform sinuses; Pharyngeal wall  Esophageal Impairment Domain: Esophageal Impairment Domain Esophageal clearance upright position: Esophageal retention Pill: Pill Consistency administered: Thin liquids (Level 0) Thin liquids (Level 0): Impaired (see clinical impressions) Penetration/Aspiration Scale Score: Penetration/Aspiration Scale Score 1.  Material does not enter airway: Puree; Solid; Pill 2.  Material enters airway, remains ABOVE vocal cords then ejected out: Thin liquids (Level 0); Mildly thick liquids (Level 2, nectar thick) Compensatory Strategies: Compensatory Strategies Compensatory strategies: No   General Information: Caregiver present: No  Diet Prior to this Study: Dysphagia 3 (mechanical soft); Mildly thick liquids (Level 2, nectar thick)   Temperature : Normal  Respiratory Status: WFL   Supplemental O2: Nasal cannula   History of Recent Intubation: No  Behavior/Cognition: Alert; Cooperative; Pleasant mood Self-Feeding Abilities: Able to self-feed; Needs set-up for self-feeding Baseline vocal quality/speech: Normal Volitional Cough: Able to elicit Volitional Swallow: Able to elicit Exam Limitations: Poor positioning (shoulders) Goal Planning: Prognosis for improved oropharyngeal function: Fair Barriers to Reach Goals: Cognitive deficits No data recorded Patient/Family Stated Goal: N/A Consulted and agree with results and recommendations: Patient; Nurse Pain: Pain Assessment Pain Assessment: No/denies pain End of Session: Start Time:SLP Start Time  (ACUTE ONLY): 1352 Stop Time: SLP Stop Time (ACUTE ONLY): 1422 Time Calculation:SLP Time Calculation (min) (ACUTE ONLY): 30 min Charges: SLP Evaluations $ SLP Speech Visit: 1 Visit SLP Evaluations $BSS Swallow: 1 Procedure SLP visit diagnosis: SLP Visit Diagnosis: Dysphagia, oropharyngeal phase (R13.12) Past Medical History: Past Medical History: Diagnosis Date  ANXIETY 02/12/2008  Qualifier: Diagnosis of  By: Autry Legions MD, Alveda Aures   CVA (cerebral vascular accident) [facility])   DEPRESSION 02/12/2008  Qualifier: Diagnosis of  By: Jarold Merlin   HYPERLIPIDEMIA 02/12/2008  Qualifier: Diagnosis of  By: Jarold Merlin   HYPERTENSION 02/12/2008  Qualifier: Diagnosis of  By: Jarold Merlin   HYPOTHYROIDISM 02/12/2008  Qualifier: Diagnosis of  By: Jarold Merlin   Impaired glucose tolerance 08/27/2011  Left hemiparesis (HCC)   OSTEOPENIA 02/12/2008  Qualifier: Diagnosis of  By: Autry Legions MD, Alveda Aures   VITAMIN D  DEFICIENCY 04/14/2010  Qualifier: Diagnosis of  By: Autry Legions MD, Alveda Aures  Past Surgical History: Past Surgical History: Procedure Laterality Date  IR RADIOLOGIST EVAL & MGMT  08/18/2021  LEFT HEART CATH AND CORONARY ANGIOGRAPHY N/A 08/03/2023  Procedure: LEFT HEART CATH AND CORONARY ANGIOGRAPHY;  Surgeon: Kyra Phy, MD;  Location: [facility] INVASIVE CV LAB;  Service: Cardiovascular;  Laterality: N/A; Thank you, Claudetta Cuba, CCC-SLP [phone] PORTER,DABNEY 02/27/2024, 5:46 PM  DG Chest Port 1 View Result Date: 02/25/2024 CLINICAL DATA:  Aspiration. EXAM: PORTABLE CHEST 1 VIEW COMPARISON:  02/24/2024 FINDINGS: The lungs are suboptimally inflated. Stable cardiomediastinal contours. No significant pleural effusion, interstitial or edema. No airspace consolidation. Chronic mid shaft of left clavicle fracture. IMPRESSION: Low lung volumes. No acute findings. Electronically Signed   By: Kimberley Penman M.D.   On: 02/25/2024 08:08   DG Chest Port 1 View Result Date: 02/25/2024 CLINICAL DATA:   cough, SOB, hypoxia EXAM: PORTABLE CHEST - 1 VIEW COMPARISON:  08/02/2023. FINDINGS: Cardiac silhouette is unremarkable. No pneumothorax or pleural effusion. The lungs are clear. Aorta is calcified. The visualized skeletal structures are unremarkable. IMPRESSION: No acute cardiopulmonary process. Electronically Signed   By: Sydell Eva M.D.   On: 02/25/2024 00:08    Microbiology: Results for orders placed or performed during the hospital encounter of 02/25/24  MRSA Next Gen by PCR, Nasal     Status: None   Collection Time: 02/25/24 12:10 PM   Specimen: Nasal Mucosa; Nasal Swab  Result Value Ref Range Status   MRSA by PCR Next Gen NOT DETECTED NOT DETECTED Final    Comment: (NOTE) The GeneXpert MRSA Assay (FDA approved for NASAL specimens only), is one component of a comprehensive MRSA colonization surveillance program. It is not intended to diagnose MRSA infection nor to guide or monitor treatment for MRSA infections. Test performance is not FDA approved in patients less than 80 years old. Performed at [facility], [address], [place], [place] [address]     Labs: CBC: Recent Labs  Lab 02/24/24 2354 02/26/24 0246  02/28/24 0453  WBC 7.6 11.2* 6.6  NEUTROABS 4.8  --  4.0  HGB 14.2 12.4 11.0*  HCT 43.6 39.1 34.7*  MCV 102.8* 101.8* 102.7*  PLT 164 152 151   Basic Metabolic Panel: Recent Labs  Lab 02/24/24 2354 02/26/24 0246 02/28/24 0453  NA 137 139 140  K 3.8 3.5 3.4*  CL 108 107 107  CO2 24 25 28   GLUCOSE 120* 122* 97  BUN 21 23 17   CREATININE 0.92 0.91 0.83  CALCIUM  8.7* 8.2* 8.1*  MG  --   --  2.2   Liver Function Tests: No results for input(s): "AST", "ALT", "ALKPHOS", "BILITOT", "PROT", "ALBUMIN" in the last 168 hours. CBG: No results for input(s): "GLUCAP" in the last 168 hours.  Discharge time spent: greater than 30 minutes.  Signed: Demaris Fillers, MD [facility] 02/28/2024

## 2024-02-29 ENCOUNTER — Telehealth: Payer: Self-pay

## 2024-02-29 NOTE — Transitions of Care (Post Inpatient/ED Visit) (Signed)
   02/29/2024  Name: Susan Davidson MRN: [id number] DOB: Feb 24, 1951  Today's TOC FU Call Status: Today's TOC FU Call Status:: Unsuccessful Call (1st Attempt) Unsuccessful Call (1st Attempt) Date: 02/29/24  Attempted to reach the patient regarding the most recent Inpatient/ED visit.  Follow Up Plan: Additional outreach attempts will be made to reach the patient to complete the Transitions of Care (Post Inpatient/ED visit) call.   Tonia Frankel RN, CCM [place]  VBCI-Population Health RN Care Manager [phone]

## 2024-03-01 ENCOUNTER — Telehealth: Payer: Self-pay | Admitting: *Deleted

## 2024-03-01 NOTE — Transitions of Care (Post Inpatient/ED Visit) (Signed)
   03/01/2024  Name: Susan Davidson MRN: [id number] DOB: [birth year]  Today's TOC FU Call Status: Today's TOC FU Call Status:: Unsuccessful Call (2nd Attempt) Unsuccessful Call (2nd Attempt) Date: 03/01/24  Attempted to reach the patient regarding the most recent Inpatient visit; left HIPAA compliant voice message requesting call back  Follow Up Plan: Additional outreach attempts will be made to reach the patient to complete the Transitions of Care (Post Inpatient visit) call.   Pls call/ message for questions,  Carisma Troupe Mckinney Merary Garguilo, RN, BSN, CCRN Alumnus RN Care Manager  Transitions of Care  [facility] - [facility] [phone]: direct office

## 2024-03-03 DIAGNOSIS — F03C4 Unspecified dementia, severe, with anxiety: Secondary | ICD-10-CM | POA: Diagnosis not present

## 2024-03-03 DIAGNOSIS — I130 Hypertensive heart and chronic kidney disease with heart failure and stage 1 through stage 4 chronic kidney disease, or unspecified chronic kidney disease: Secondary | ICD-10-CM | POA: Diagnosis not present

## 2024-03-03 DIAGNOSIS — J690 Pneumonitis due to inhalation of food and vomit: Secondary | ICD-10-CM | POA: Diagnosis not present

## 2024-03-03 DIAGNOSIS — F411 Generalized anxiety disorder: Secondary | ICD-10-CM | POA: Diagnosis not present

## 2024-03-03 DIAGNOSIS — F32A Depression, unspecified: Secondary | ICD-10-CM | POA: Diagnosis not present

## 2024-03-03 DIAGNOSIS — F03C3 Unspecified dementia, severe, with mood disturbance: Secondary | ICD-10-CM | POA: Diagnosis not present

## 2024-03-03 DIAGNOSIS — I5032 Chronic diastolic (congestive) heart failure: Secondary | ICD-10-CM | POA: Diagnosis not present

## 2024-03-03 DIAGNOSIS — R1310 Dysphagia, unspecified: Secondary | ICD-10-CM | POA: Diagnosis not present

## 2024-03-03 DIAGNOSIS — N1831 Chronic kidney disease, stage 3a: Secondary | ICD-10-CM | POA: Diagnosis not present

## 2024-03-04 ENCOUNTER — Telehealth: Payer: Self-pay | Admitting: *Deleted

## 2024-03-04 DIAGNOSIS — I69354 Hemiplegia and hemiparesis following cerebral infarction affecting left non-dominant side: Secondary | ICD-10-CM | POA: Diagnosis not present

## 2024-03-04 DIAGNOSIS — I639 Cerebral infarction, unspecified: Secondary | ICD-10-CM | POA: Diagnosis not present

## 2024-03-04 DIAGNOSIS — Z515 Encounter for palliative care: Secondary | ICD-10-CM | POA: Diagnosis not present

## 2024-03-04 NOTE — Transitions of Care (Post Inpatient/ED Visit) (Signed)
   03/04/2024  Name: Susan Davidson MRN: [id number] DOB: January 17, 1951  Today's TOC FU Call Status: Today's TOC FU Call Status:: Unsuccessful Call (3rd Attempt) Unsuccessful Call (3rd Attempt) Date: 03/04/24  Attempted to reach the patient regarding the most recent Inpatient visit.  Left HIPAA compliant voice message requesting call back  Follow Up Plan: No further outreach attempts will be made at this time. We have been unable to contact the patient.  Pls call/ message for questions,  Alyxandria Wentz Mckinney Westlynn Fifer, RN, BSN, CCRN Alumnus RN Care Manager  Transitions of Care  [facility] - [facility] [phone]: direct office

## 2024-03-26 ENCOUNTER — Ambulatory Visit (INDEPENDENT_AMBULATORY_CARE_PROVIDER_SITE_OTHER): Payer: Medicare HMO | Admitting: Podiatry

## 2024-03-26 ENCOUNTER — Encounter: Payer: Self-pay | Admitting: Podiatry

## 2024-03-26 DIAGNOSIS — M79675 Pain in left toe(s): Secondary | ICD-10-CM | POA: Diagnosis not present

## 2024-03-26 DIAGNOSIS — N1831 Chronic kidney disease, stage 3a: Secondary | ICD-10-CM | POA: Diagnosis not present

## 2024-03-26 DIAGNOSIS — M79674 Pain in right toe(s): Secondary | ICD-10-CM

## 2024-03-26 DIAGNOSIS — B351 Tinea unguium: Secondary | ICD-10-CM | POA: Diagnosis not present

## 2024-03-26 NOTE — Progress Notes (Signed)
 This patient presents to the office with chief complaint of long thick painful nails.  Patient says the nails are painful walking and wearing shoes.  This patient is unable to self treat.  This patient is unable to trim her nails since she is unable to reach her nails.  She present to the office in a wheelchair accompanied by female caregiver. She presents to the office for preventative foot care services.  General Appearance  Alert, conversant and in no acute stress.  Vascular  Dorsalis pedis and posterior tibial  pulses are weakly  palpable  bilaterally.  Capillary return is within normal limits  bilaterally. Temperature is within normal limits  bilaterally. Purplish feet  B/L.  Neurologic  Senn-Weinstein monofilament wire test within normal limits  bilaterally. Muscle power within normal limits bilaterally.  Nails Thick disfigured discolored nails with subungual debris  from hallux to fifth toes bilaterally. No evidence of bacterial infection or drainage bilaterally.  Orthopedic  No limitations of motion  feet .  No crepitus or effusions noted.  No bony pathology or digital deformities noted.  Skin  normotropic skin with no porokeratosis noted bilaterally.  No signs of infections or ulcers noted.     Onychomycosis  Nails  B/L.  Pain in right toes  Pain in left toes  Debridement of nails both feet followed trimming the nails with dremel tool.    RTC 6   months.   Cordella Bold DPM

## 2024-03-30 ENCOUNTER — Encounter (HOSPITAL_COMMUNITY): Payer: Self-pay | Admitting: Interventional Radiology

## 2024-04-01 DIAGNOSIS — I69354 Hemiplegia and hemiparesis following cerebral infarction affecting left non-dominant side: Secondary | ICD-10-CM | POA: Diagnosis not present

## 2024-04-01 DIAGNOSIS — I639 Cerebral infarction, unspecified: Secondary | ICD-10-CM | POA: Diagnosis not present

## 2024-04-01 DIAGNOSIS — Z515 Encounter for palliative care: Secondary | ICD-10-CM | POA: Diagnosis not present

## 2024-04-11 ENCOUNTER — Other Ambulatory Visit: Payer: Self-pay | Admitting: Internal Medicine

## 2024-04-12 ENCOUNTER — Telehealth: Payer: Self-pay

## 2024-04-12 NOTE — Telephone Encounter (Signed)
 Copied from CRM [id number]. Topic: General - Other >> Apr 12, 2024  2:30 PM Deleta RAMAN wrote: Reason for CRM: [facility] is calling due to physical orders being sent to Dr. Norleen in regards to the patient. Corlis with [facility] did not receive any information regarding these orders she has faxed the orders over again on 7/11. Any questions or concern be sure to reach her at [phone]

## 2024-04-17 NOTE — Telephone Encounter (Signed)
 Called and informed Clayborne at [facility] that the orders have been signed and faxed back with confirmation

## 2024-04-29 ENCOUNTER — Encounter: Payer: Medicare HMO | Admitting: Internal Medicine

## 2024-05-01 ENCOUNTER — Encounter: Admitting: Internal Medicine

## 2024-05-02 ENCOUNTER — Ambulatory Visit

## 2024-05-02 VITALS — Ht 62.0 in | Wt 140.0 lb

## 2024-05-02 DIAGNOSIS — Z0000 Encounter for general adult medical examination without abnormal findings: Secondary | ICD-10-CM

## 2024-05-02 NOTE — Patient Instructions (Addendum)
 Susan Davidson , Thank you for taking time out of your busy schedule to complete your Annual Wellness Visit with me. I enjoyed our conversation and look forward to speaking with you again next year. I, as well as your care team,  appreciate your ongoing commitment to your health goals. Please review the following plan we discussed and let me know if I can assist you in the future. Your Game plan/ To Do List    Referrals: If you haven't heard from the office you've been referred to, please reach out to them at the phone provided.   Follow up Visits: We will see or speak with you next year for your Next Medicare AWV with our clinical staff Have you seen your provider in the last 6 months (3 months if uncontrolled diabetes)? No  Clinician Recommendations:  Aim for 30 minutes of exercise or brisk walking, 6-8 glasses of water, and 5 servings of fruits and vegetables each day.       This is a list of the screenings recommended for you:  Health Maintenance  Topic Date Due   Zoster (Shingles) Vaccine (1 of 2) 05/03/2024*   DTaP/Tdap/Td vaccine (2 - Td or Tdap) 01/31/2025*   Colon Cancer Screening  01/31/2025*   Flu Shot  05/03/2024   Medicare Annual Wellness Visit  05/02/2025   Pneumococcal Vaccine for age over 21  Completed   DEXA scan (bone density measurement)  Completed   Hepatitis C Screening  Completed   Hepatitis B Vaccine  Aged Out   HPV Vaccine  Aged Out   Meningitis B Vaccine  Aged Out   COVID-19 Vaccine  Discontinued  *Topic was postponed. The date shown is not the original due date.    Advanced directives: (In Chart) A copy of your advanced directives are scanned into your chart should your provider ever need it. Advance Care Planning is important because it:  [x]  Makes sure you receive the medical care that is consistent with your values, goals, and preferences  [x]  It provides guidance to your family and loved ones and reduces their decisional burden about whether or not they are  making the right decisions based on your wishes.  Follow the link provided in your after visit summary or read over the paperwork we have mailed to you to help you started getting your Advance Directives in place. If you need assistance in completing these, please reach out to us  so that we can help you!

## 2024-05-02 NOTE — Progress Notes (Signed)
 Subjective:   Susan Davidson is a 73 y.o. who presents for a Medicare Wellness preventive visit.  As a reminder, Annual Wellness Visits don't include a physical exam, and some assessments may be limited, especially if this visit is performed virtually. We may recommend an in-person follow-up visit with your provider if needed.  Visit Complete: Virtual I connected with  Susan Davidson on 05/02/24 by a audio enabled telemedicine application and verified that I am speaking with the correct person using two identifiers.  Patient Location: Home  Provider Location: Office/Clinic  I discussed the limitations of evaluation and management by telemedicine. The patient expressed understanding and agreed to proceed.  Vital Signs: Because this visit was a virtual/telehealth visit, some criteria may be missing or patient reported. Any vitals not documented were not able to be obtained and vitals that have been documented are patient reported.  VideoDeclined- This patient declined Librarian, academic. Therefore the visit was completed with audio only.  Persons Participating in Visit: Patient assisted by Spouse, Susan Davidson.  AWV Questionnaire: No: Patient Medicare AWV questionnaire was not completed prior to this visit.  Cardiac Risk Factors include: advanced age (>66men, >94 women);dyslipidemia;hypertension     Objective:    Today's Vitals   05/02/24 1235  Weight: 140 lb (63.5 kg)  Height: 5' 2 (1.575 m)   Body mass index is 25.61 kg/m.     05/02/2024   12:34 PM 02/25/2024   11:14 AM 02/25/2024    5:59 AM 02/24/2024   11:36 PM 08/02/2023    4:40 PM 05/17/2023    9:26 PM 05/17/2023   11:29 AM  Advanced Directives  Does Patient Have a Medical Advance Directive? Yes No No No No Unable to assess, patient is non-responsive or altered mental status Unable to assess, patient is non-responsive or altered mental status  Type of Public librarian Power of  [place];Living will        Does patient want to make changes to medical advance directive? No - Patient declined        Copy of Healthcare Power of Attorney in Chart? Yes - validated most recent copy scanned in chart (See row information)        Would patient like information on creating a medical advance directive?  No - Patient declined   No - Patient declined      Current Medications (verified) Outpatient Encounter Medications as of 05/02/2024  Medication Sig   acetaminophen  (TYLENOL ) 325 MG tablet Take 2 tablets (650 mg total) by mouth every 6 (six) hours as needed for mild pain or headache (fever >/= 101).   albuterol  (VENTOLIN  HFA) 108 (90 Base) MCG/ACT inhaler Inhale 2 puffs into the lungs every 6 (six) hours as needed for wheezing or shortness of breath.   alendronate  (FOSAMAX ) 70 MG tablet TAKE 1 TABLET EVERY 7 DAYS WITH A FULL GLASS OF WATER ON AN EMPTY STOMACH   ALPRAZolam  (XANAX ) 0.5 MG tablet TAKE 1 TABLET TWICE DAILY AS NEEDED FOR ANXIETY   amoxicillin -clavulanate (AUGMENTIN ) 400-57 MG/5ML suspension Take 10 mLs (800 mg total) by mouth every 12 (twelve) hours.   ascorbic acid  (VITAMIN C) 500 MG tablet Take 1 tablet (500 mg total) by mouth daily.   aspirin  EC 81 MG tablet Take 1 tablet (81 mg total) by mouth daily. Swallow whole.   Cholecalciferol (VITAMIN D -3 PO) Take 1 capsule by mouth at bedtime.   clopidogrel  (PLAVIX ) 75 MG tablet TAKE 1 TABLET EVERY DAY  Coenzyme Q10 (COQ10 PO) Take 1 tablet by mouth at bedtime.   divalproex  (DEPAKOTE ) 250 MG DR tablet Take 1 tablet by mouth See admin instructions. 1 tablet in the morning and 2 tablets every evening   folic acid  (FOLVITE ) 800 MCG tablet Take 400 mcg by mouth at bedtime.   isosorbide  mononitrate (IMDUR ) 30 MG 24 hr tablet Take 1 tablet (30 mg total) by mouth daily.   lactose free nutrition (BOOST) LIQD Take 237 mLs by mouth 2 (two) times daily between meals. At least one a day and sometimes more   levothyroxine  (SYNTHROID )  100 MCG tablet Take 1 tablet (100 mcg total) by mouth daily.   linaclotide  (LINZESS ) 145 MCG CAPS capsule Take 1 capsule (145 mcg total) by mouth daily.   MAGNESIUM OXIDE, ELEMENTAL, PO Take 1 tablet by mouth at bedtime.   memantine  (NAMENDA ) 5 MG tablet Take 1 tablet (5 mg total) by mouth 2 (two) times daily.   metoprolol  tartrate (LOPRESSOR ) 25 MG tablet TAKE 1/2 TABLET TWICE DAILY   Multiple Vitamin (MULTIVITAMIN) tablet Take 1 tablet by mouth at bedtime.   nitroGLYCERIN  (NITROSTAT ) 0.4 MG SL tablet Place 1 tablet (0.4 mg total) under the tongue every 5 (five) minutes as needed for chest pain. Up to 3 doses in one 24 hour period   pantoprazole  (PROTONIX ) 40 MG tablet Take 1 tablet (40 mg total) by mouth daily.   PARoxetine  (PAXIL ) 40 MG tablet Take 40 mg by mouth every morning.   QUEtiapine  (SEROQUEL ) 50 MG tablet Take 50 mg by mouth at bedtime.   rosuvastatin  (CRESTOR ) 20 MG tablet TAKE 1 TABLET AT BEDTIME   No facility-administered encounter medications on file as of 05/02/2024.    Allergies (verified) Fire ant (solenopsis costa rica), Aleve [naproxen], Covid-19 mrna vaccine (pfizer) [covid-19 mrna vacc (moderna)], and Influenza vac split quad   History: Past Medical History:  Diagnosis Date   ANXIETY 02/12/2008   Qualifier: Diagnosis of  By: Norleen MD, Lynwood ORN    CVA (cerebral vascular accident) [facility])    DEPRESSION 02/12/2008   Qualifier: Diagnosis of  By: Helga Almarie Caldron    HYPERLIPIDEMIA 02/12/2008   Qualifier: Diagnosis of  By: Helga Almarie Caldron    HYPERTENSION 02/12/2008   Qualifier: Diagnosis of  By: Helga Almarie Caldron    HYPOTHYROIDISM 02/12/2008   Qualifier: Diagnosis of  By: Helga Almarie Caldron    Impaired glucose tolerance 08/27/2011   Left hemiparesis (HCC)    OSTEOPENIA 02/12/2008   Qualifier: Diagnosis of  By: Norleen MD, Lynwood ORN    VITAMIN D  DEFICIENCY 04/14/2010   Qualifier: Diagnosis of  By: Norleen MD, Lynwood ORN    Past Surgical History:   Procedure Laterality Date   IR RADIOLOGIST EVAL & MGMT  08/18/2021   LEFT HEART CATH AND CORONARY ANGIOGRAPHY N/A 08/03/2023   Procedure: LEFT HEART CATH AND CORONARY ANGIOGRAPHY;  Surgeon: Wendel Lurena POUR, MD;  Location: [facility] INVASIVE CV LAB;  Service: Cardiovascular;  Laterality: N/A;   Family History  Problem Relation Age of Onset   Heart disease Father    Bipolar disorder Sister    Diabetes Neg Hx    Social History   Socioeconomic History   Marital status: Married    Spouse name: Not on file   Number of children: 0   Years of education: Not on file   Highest education level: Not on file  Occupational History   Occupation: disabled  Tobacco Use   Smoking status: Former    Current  packs/day: 0.00    Types: Cigarettes    Quit date: 01/26/2021    Years since quitting: 3.2   Smokeless tobacco: Never  Vaping Use   Vaping status: Never Used  Substance and Sexual Activity   Alcohol use: Not Currently   Drug use: No   Sexual activity: Not Currently  Other Topics Concern   Not on file  Social History Narrative   Married   Social Drivers of Health   Financial Resource Strain: Low Risk  (05/02/2024)   Overall Financial Resource Strain (CARDIA)    Difficulty of Paying Living Expenses: Not hard at all  Food Insecurity: No Food Insecurity (05/02/2024)   Hunger Vital Sign    Worried About Running Out of Food in the Last Year: Never true    Ran Out of Food in the Last Year: Never true  Transportation Needs: No Transportation Needs (05/02/2024)   PRAPARE - Administrator, Civil Service (Medical): No    Lack of Transportation (Non-Medical): No  Physical Activity: Inactive (05/02/2024)   Exercise Vital Sign    Days of Exercise per Week: 0 days    Minutes of Exercise per Session: 0 min  Stress: No Stress Concern Present (05/02/2024)   [employer] of Occupational Health - Occupational Stress Questionnaire    Feeling of Stress: Not at all  Social Connections:  Moderately Isolated (05/02/2024)   Social Connection and Isolation Panel    Frequency of Communication with Friends and Family: Twice a week    Frequency of Social Gatherings with Friends and Family: Once a week    Attends Religious Services: Never    Database administrator or Organizations: No    Attends Engineer, structural: Never    Marital Status: Married    Tobacco Counseling Counseling given: No    Clinical Intake:  Pre-visit preparation completed: Yes  Pain : No/denies pain     BMI - recorded: 25.61 Nutritional Status: BMI 25 -29 Overweight Nutritional Risks: None Diabetes: No  Lab Results  Component Value Date   HGBA1C 6.3 02/01/2024   HGBA1C 6.3 04/10/2023   HGBA1C 6.1 01/06/2022     How often do you need to have someone help you when you read instructions, pamphlets, or other written materials from your doctor or pharmacy?: 5 - Always (Spouse)  Interpreter Needed?: No  Information entered by :: Susan Davidson, Susan Davidson   Activities of Daily Living     05/02/2024   12:47 PM 02/25/2024   11:14 AM  In your present state of health, do you have any difficulty performing the following activities:  Hearing? 0 0  Vision? 0 0  Difficulty concentrating or making decisions? 1 0  Comment spouse helps   Walking or climbing stairs? 1   Comment does not walk - in a wheelchair   Dressing or bathing? 1   Comment Caregiver helps   Doing errands, shopping? 1 0  Comment Spouse helps   Preparing Food and eating ? Y   Comment Spous/Caregiver helps   Using the Toilet? Y   Comment Spous/Caregiver helps   In the past six months, have you accidently leaked urine? Y   Comment wears a depend   Do you have problems with loss of bowel control? Y   Comment wears a depend   Managing your Medications? Y   Comment Spous/Caregiver helps   Managing your Finances? Y   Comment spouse helps   Housekeeping or managing your Housekeeping? Y  Comment Spous/Caregiver helps      Patient Care Team: Norleen Lynwood ORN, MD as PCP - General (Internal Medicine) Santo Stanly LABOR, MD as PCP - Cardiology (Cardiology)  I have updated your Care Teams any recent Medical Services you may have received from other providers in the past year.     Assessment:   This is a routine wellness examination for Susan Davidson.  Hearing/Vision screen Hearing Screening - Comments:: Denies hearing difficulties   Vision Screening - Comments:: Wears rx glasses - had seen an Optometrist [facility]yrs ago at Rehab.   Goals Addressed               This Visit's Progress     Patient Stated (pt-stated)        Patient stated she plans to stay on routine       Depression Screen     05/02/2024   12:40 PM 02/01/2024    2:57 PM 10/02/2023    9:57 AM 05/29/2023    9:04 AM 04/27/2023   10:49 AM 04/10/2023    9:11 AM 10/10/2022    3:04 PM  PHQ 2/9 Scores  PHQ - 2 Score 1 0 0 0 5 0 1  PHQ- 9 Score 1    19      Fall Risk     05/02/2024   12:40 PM 02/01/2024    3:02 PM 10/02/2023    9:57 AM 05/29/2023    9:04 AM 04/27/2023   10:46 AM  Fall Risk   Falls in the past year? 0 0 0 0 0  Number falls in past yr: 0 0 0 0 0  Injury with Fall? 0 0 0 0 0  Risk for fall due to : No Fall Risks;Impaired mobility;Mental status change No Fall Risks No Fall Risks No Fall Risks No Fall Risks  Follow up Falls evaluation completed;Falls prevention discussed Falls evaluation completed Falls evaluation completed Falls evaluation completed Falls prevention discussed    MEDICARE RISK AT HOME:  Medicare Risk at Home Any stairs in or around the home?: No If so, are there any without handrails?: No Home free of loose throw rugs in walkways, pet beds, electrical cords, etc?: Yes Adequate lighting in your home to reduce risk of falls?: Yes Life alert?: No Use of a cane, walker or w/c?: Yes (wheelchair) Grab bars in the bathroom?: No Shower chair or bench in shower?: Yes Elevated toilet seat or a handicapped toilet?:  No  TIMED UP AND GO:  Was the test performed?  No  Cognitive Function: Impaired: Patient has current diagnosis of cognitive impairment.    05/02/2024   12:44 PM  MMSE - Mini Mental State Exam  Not completed: Unable to complete        Immunizations Immunization History  Administered Date(s) Administered   Influenza-Unspecified 08/18/2021   Moderna Sars-Covid-2 Vaccination 12/02/2019   PNEUMOCOCCAL CONJUGATE-20 05/29/2023   Tdap 10/19/2012    Screening Tests Health Maintenance  Topic Date Due   Zoster Vaccines- Shingrix (1 of 2) 05/03/2024 (Originally 04/07/1970)   DTaP/Tdap/Td (2 - Td or Tdap) 01/31/2025 (Originally 10/19/2022)   Colonoscopy  01/31/2025 (Originally 04/07/1994)   INFLUENZA VACCINE  05/03/2024   Medicare Annual Wellness (AWV)  05/02/2025   Pneumococcal Vaccine: 50+ Years  Completed   DEXA SCAN  Completed   Hepatitis C Screening  Completed   Hepatitis B Vaccines  Aged Out   HPV VACCINES  Aged Out   Meningococcal B Vaccine  Aged Out   COVID-19 Vaccine  Discontinued    Health Maintenance  There are no preventive care reminders to display for this patient. Health Maintenance Items Addressed: 05/02/2024   Additional Screening:  Vision Screening: Recommended annual ophthalmology exams for early detection of glaucoma and other disorders of the eye. Would you like a referral to an eye doctor? No    Dental Screening: Recommended annual dental exams for proper oral hygiene  Community Resource Referral / Chronic Care Management: CRR required this visit?  No   CCM required this visit?  No   Plan:    I have personally reviewed and noted the following in the patient's chart:   Medical and social history Use of alcohol, tobacco or illicit drugs  Current medications and supplements including opioid prescriptions. Patient is not currently taking opioid prescriptions. Functional ability and status Nutritional status Physical activity Advanced  directives List of other physicians Hospitalizations, surgeries, and ER visits in previous 12 months Vitals Screenings to include cognitive, depression, and falls Referrals and appointments  In addition, I have reviewed and discussed with patient certain preventive protocols, quality metrics, and best practice recommendations. A written personalized care plan for preventive services as well as general preventive health recommendations were provided to patient.   Susan CHRISTELLA Davidson, Susan Davidson   05/02/2024   After Visit Summary: (MyChart) Due to this being a telephonic visit, the after visit summary with patients personalized plan was offered to patient via MyChart   Notes: Nothing significant to report at this time.

## 2024-05-06 DIAGNOSIS — I69354 Hemiplegia and hemiparesis following cerebral infarction affecting left non-dominant side: Secondary | ICD-10-CM | POA: Diagnosis not present

## 2024-05-06 DIAGNOSIS — Z515 Encounter for palliative care: Secondary | ICD-10-CM | POA: Diagnosis not present

## 2024-05-06 DIAGNOSIS — I6340 Cerebral infarction due to embolism of unspecified cerebral artery: Secondary | ICD-10-CM | POA: Diagnosis not present

## 2024-05-14 ENCOUNTER — Other Ambulatory Visit: Payer: Self-pay | Admitting: Cardiology

## 2024-05-28 DIAGNOSIS — F429 Obsessive-compulsive disorder, unspecified: Secondary | ICD-10-CM | POA: Diagnosis not present

## 2024-05-28 DIAGNOSIS — F902 Attention-deficit hyperactivity disorder, combined type: Secondary | ICD-10-CM | POA: Diagnosis not present

## 2024-05-28 DIAGNOSIS — F3181 Bipolar II disorder: Secondary | ICD-10-CM | POA: Diagnosis not present

## 2024-06-05 ENCOUNTER — Ambulatory Visit: Attending: Cardiovascular Disease | Admitting: Internal Medicine

## 2024-06-05 VITALS — BP 115/70 | HR 58 | Wt 135.0 lb

## 2024-06-05 DIAGNOSIS — I2510 Atherosclerotic heart disease of native coronary artery without angina pectoris: Secondary | ICD-10-CM | POA: Diagnosis not present

## 2024-06-05 DIAGNOSIS — R001 Bradycardia, unspecified: Secondary | ICD-10-CM | POA: Diagnosis not present

## 2024-06-05 DIAGNOSIS — F0390 Unspecified dementia without behavioral disturbance: Secondary | ICD-10-CM

## 2024-06-05 NOTE — Patient Instructions (Signed)
 Medication Instructions:  Your physician has recommended you make the following change in your medication:  In November STOP Aspirin   *If you need a refill on your cardiac medications before your next appointment, please call your pharmacy*  Lab Work: NONE  If you have labs (blood work) drawn today and your tests are completely normal, you will receive your results only by: MyChart Message (if you have MyChart) OR A paper copy in the mail If you have any lab test that is abnormal or we need to change your treatment, we will call you to review the results.  Testing/Procedures: NONE  Follow-Up: At [facility], you and your health needs are our priority.  As part of our continuing mission to provide you with exceptional heart care, our providers are all part of one team.  This team includes your primary Cardiologist (physician) and Advanced Practice Providers or APPs (Physician Assistants and Nurse Practitioners) who all work together to provide you with the care you need, when you need it.  Your next appointment:   6 month(s)  Provider:   Katlyn West, NP

## 2024-06-05 NOTE — Progress Notes (Signed)
 Cardiology Office Note:  .    Date:  06/05/2024  ID:  Susan Davidson, DOB [birth year], MRN [id number] PCP: Norleen Lynwood ORN, MD  [facility] Providers Cardiologist:  Stanly DELENA Leavens, MD     CC: Follow up NSTEMI care  History of Present Illness: Susan    SHANTRICE Davidson is a 73 y.o. female with NSTEMI, hypertension, hyperlipidemia, and diabetes who presents for follow-up of her coronary artery disease. She is accompanied by her husband, who is her primary caregiver.  She has a history of NSTEMI and was last hospitalized on August 23, 2023, due to an escalation of chest pain. At that time, percutaneous intervention was considered, but a conservative approach was taken. Currently, she has no chest pain, and her husband notes that she uses nitroglycerin  about once every two to three months, although she dislikes it due to a burning sensation under her tongue.  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 She also has chronic kidney disease, hypertension, hyperlipidemia, and diabetes. She is largely bedridden and supported by her spouse.  She experiences intermittent problems with swallowing, which led to an aspiration infection earlier in the year, around April. This has made medication administration challenging due to the number of medications she is on.  She has dementia, with episodes of seeing her parents and occasionally forgetting her husband. She has engaged with a palliative care agency for additional support, meeting with them monthly.  Discussed the use of AI scribe software for clinical note transcription with the patient, who gave verbal consent to proceed.   Relevant histories: .  Social  - Partner Status: Married - Living Situation: Lives at home with spouse ROS: As per HPI.   Studies Reviewed: .     Cardiac Studies & Procedures    ______________________________________________________________________________________________ CARDIAC CATHETERIZATION  CARDIAC CATHETERIZATION 08/03/2023  Conclusion   Mid RCA lesion is 95% stenosed.  1.  High-grade mid right coronary artery lesion.  After review with Dr. Leavens and given its tortuosity and calcification in the context of the patient's comorbidities we have elected to pursue medical therapy.  If the patient is anginal symptoms cannot be controlled PCI can be considered. 2.  Mild disease of small caliber LAD and left circumflex. 3.  LVEDP of 10 mmHg:  Summary: Medical therapy for acute coronary syndrome.  A Plavix  load was ordered.  If the patient's chest pain cannot be controlled with medical therapy PCI can be considered.  Findings Coronary Findings Diagnostic  Dominance: Right  Left Anterior Descending Vessel is small. The vessel exhibits minimal luminal irregularities.  Left Circumflex The vessel exhibits minimal luminal irregularities.  Right Coronary Artery Mid RCA lesion is 95% stenosed.  Intervention  No interventions have been documented.     ECHOCARDIOGRAM  ECHOCARDIOGRAM COMPLETE 08/03/2023  Narrative ECHOCARDIOGRAM REPORT    Patient Name:   Susan Davidson Date of Exam: 08/03/2023 Medical Rec #:  [id number]       Height:       62.0 in Accession #:    [id number]      Weight:       138.9 lb Date of Birth:  March 05, 1951        BSA:          1.637 m Patient Age:    72 years        BP:           133/90 mmHg Patient Gender: F  HR:           81 bpm. Exam Location:  Inpatient  Procedure: 2D Echo, Cardiac Doppler and Color Doppler  Indications:    NSTEMI  History:        Patient has prior history of Echocardiogram examinations, most recent 07/04/2021. Stroke and CKD; Risk Factors:Hypertension, Dyslipidemia and Former Smoker.  Sonographer:    Tillman Nora RVT RCS Referring Phys: [id number] DAMARCUS A INGRAM   Sonographer  Comments: Technically challenging study due to limited acoustic windows, Technically difficult study due to poor echo windows, suboptimal parasternal window, suboptimal apical window and suboptimal subcostal window. Image acquisition challenging due to uncooperative patient and Image acquisition challenging due to respiratory motion. IMPRESSIONS   1. Left ventricular ejection fraction, by estimation, is 60 to 65%. The left ventricle has normal function. The left ventricle has no regional wall motion abnormalities. Left ventricular diastolic parameters are consistent with Grade I diastolic dysfunction (impaired relaxation). 2. Right ventricular systolic function is normal. The right ventricular size is normal. Tricuspid regurgitation signal is inadequate for assessing PA pressure. 3. A small pericardial effusion is present. 4. The mitral valve is normal in structure. No evidence of mitral valve regurgitation. 5. The aortic valve was not well visualized. Aortic valve regurgitation is not visualized. 6. Aortic not well visualized. 7. The inferior vena cava is normal in size with greater than 50% respiratory variability, suggesting right atrial pressure of 3 mmHg.  FINDINGS Left Ventricle: Left ventricular ejection fraction, by estimation, is 60 to 65%. The left ventricle has normal function. The left ventricle has no regional wall motion abnormalities. The left ventricular internal cavity size was normal in size. There is no left ventricular hypertrophy. Left ventricular diastolic parameters are consistent with Grade I diastolic dysfunction (impaired relaxation).  Right Ventricle: The right ventricular size is normal. Right ventricular systolic function is normal. Tricuspid regurgitation signal is inadequate for assessing PA pressure.  Left Atrium: Left atrial size was normal in size.  Right Atrium: Right atrial size was normal in size.  Pericardium: A small pericardial effusion is  present.  Mitral Valve: The mitral valve is normal in structure. No evidence of mitral valve regurgitation.  Tricuspid Valve: Tricuspid valve regurgitation is not demonstrated.  Aortic Valve: The aortic valve was not well visualized. Aortic valve regurgitation is not visualized. Aortic valve mean gradient measures 1.0 mmHg. Aortic valve peak gradient measures 2.2 mmHg. Aortic valve area, by VTI measures 1.98 cm.  Pulmonic Valve: The pulmonic valve was not well visualized. Pulmonic valve regurgitation is not visualized.  Aorta: Not well visualized.  Venous: The inferior vena cava is normal in size with greater than 50% respiratory variability, suggesting right atrial pressure of 3 mmHg.  IAS/Shunts: The interatrial septum was not well visualized.   LEFT VENTRICLE PLAX 2D LVIDd:         3.70 cm   Diastology LVIDs:         1.90 cm   LV e' medial:    5.26 cm/s LV PW:         0.90 cm   LV E/e' medial:  11.0 LV IVS:        0.90 cm   LV e' lateral:   6.57 cm/s LVOT diam:     1.60 cm   LV E/e' lateral: 8.8 LV SV:         27 LV SV Index:   17 LVOT Area:     2.01 cm   RIGHT VENTRICLE  IVC RV S prime:     10.30 cm/s  IVC diam: 1.30 cm  LEFT ATRIUM           Index LA Vol (A4C): 14.0 ml 8.55 ml/m AORTIC VALVE                    PULMONIC VALVE AV Area (Vmax):    2.06 cm     PV Vmax:       1.17 m/s AV Area (Vmean):   2.00 cm     PV Peak grad:  5.5 mmHg AV Area (VTI):     1.98 cm AV Vmax:           74.37 cm/s AV Vmean:          49.967 cm/s AV VTI:            0.138 m AV Peak Grad:      2.2 mmHg AV Mean Grad:      1.0 mmHg LVOT Vmax:         76.20 cm/s LVOT Vmean:        49.600 cm/s LVOT VTI:          0.136 m LVOT/AV VTI ratio: 0.99  AORTA Ao Root diam: 2.60 cm  MITRAL VALVE MV Area (PHT): 3.85 cm    SHUNTS MV Decel Time: 197 msec    Systemic VTI:  0.14 m MV E velocity: 58.10 cm/s  Systemic Diam: 1.60 cm MV A velocity: 96.70 cm/s MV E/A ratio:  0.60  Sales promotion account executive signed by Ronal Ross Signature Date/Time: 08/03/2023/2:43:47 PM    Final          ______________________________________________________________________________________________       Physical Exam:    VS:  BP 115/70   Pulse (!) 58   Wt 135 lb (61.2 kg)   SpO2 94%   BMI 24.69 kg/m    Wt Readings from Last 3 Encounters:  06/05/24 135 lb (61.2 kg)  05/02/24 140 lb (63.5 kg)  02/26/24 132 lb 11.5 oz (60.2 kg)    Gen: elderly female NAD  thin in appearance Cardiac: No Rubs or Gallops, no Murmur, regular bradycardia, +2 radial pulses Respiratory: Clear to auscultation bilaterally, normal effort, normal  respiratory rate GI: Soft, nontender, non-distended  MS: No edema;  moves all extremities Integument: Skin feels warm  ASSESSMENT AND PLAN: .    Coronary artery disease with prior NSTEMI History of NSTEMI with conservative management due to technically challenging blockage. Intermittent chest discomfort reported, but overall well-managed. Current regimen includes aspirin , Plavix , isosorbide  mononitrate, metoprolol , rosuvastatin , and nitroglycerin  as needed. Discussed potential transition to focus on comfort care, which may involve discontinuing aspirin , Plavix , and rosuvastatin  if focusing solely on comfort. - Continue current medication regimen: aspirin , Plavix , isosorbide  mononitrate, metoprolol , rosuvastatin , and nitroglycerin  as needed - Discontinue aspirin  in November - Consider transition to comfort care if needed, which may involve discontinuing aspirin , Plavix , and rosuvastatin  - asymptomatic bradycardia; would neither change dementia therapy nor BB  Dementia Dementia with episodes of confusion and memory loss, including forgetting familiar people. Managed at home with support from spouse and palliative care team.  Dysphagia with history of aspiration pneumonitis Intermittent swallowing difficulties with a history of aspiration pneumonitis.  Recent aspiration event in April, but recovered. Swallowing difficulties complicate medication administration.  Palliative care management Palliative care involved for symptom management and support. Regular meetings with palliative care team to address day-to-day issues and support at home. - Continue regular meetings with  palliative care team  6 moth f/u with Kaitlyn  Jannel Lynne, MD FASE [facility] Cardiologist [facility]  [address] [place], [address] [place], [place] [address] [phone]  4:11 PM

## 2024-06-09 NOTE — Patient Instructions (Incomplete)

## 2024-06-10 ENCOUNTER — Other Ambulatory Visit: Payer: Self-pay | Admitting: Nurse Practitioner

## 2024-06-10 ENCOUNTER — Ambulatory Visit: Admitting: Nurse Practitioner

## 2024-06-10 DIAGNOSIS — E559 Vitamin D deficiency, unspecified: Secondary | ICD-10-CM | POA: Diagnosis not present

## 2024-06-10 DIAGNOSIS — R7302 Impaired glucose tolerance (oral): Secondary | ICD-10-CM | POA: Diagnosis not present

## 2024-06-10 DIAGNOSIS — F411 Generalized anxiety disorder: Secondary | ICD-10-CM | POA: Diagnosis not present

## 2024-06-10 DIAGNOSIS — E890 Postprocedural hypothyroidism: Secondary | ICD-10-CM

## 2024-06-10 DIAGNOSIS — I10 Essential (primary) hypertension: Secondary | ICD-10-CM | POA: Diagnosis not present

## 2024-06-10 DIAGNOSIS — E039 Hypothyroidism, unspecified: Secondary | ICD-10-CM | POA: Diagnosis not present

## 2024-06-10 DIAGNOSIS — E785 Hyperlipidemia, unspecified: Secondary | ICD-10-CM | POA: Diagnosis not present

## 2024-06-11 LAB — TSH: TSH: 1.23 u[IU]/mL (ref 0.450–4.500)

## 2024-06-11 LAB — T4, FREE: Free T4: 1.4 ng/dL (ref 0.82–1.77)

## 2024-06-18 ENCOUNTER — Telehealth: Payer: Self-pay | Admitting: Internal Medicine

## 2024-06-18 NOTE — Telephone Encounter (Signed)
 Prescription Request  06/18/2024  LOV: 02/01/2024  What is the name of the medication or equipment? Memantine  5mg ,.  Have you contacted your pharmacy to request a refill? Yes   Which pharmacy would you like this sent to?  [facility] - [place], [place] - [address] [place] [address] Phone: [phone] Fax: [phone]    Patient notified that their request is being sent to the clinical staff for review and that they should receive a response within 2 business days.   Please advise at Mobile [phone] (mobile)

## 2024-06-20 ENCOUNTER — Ambulatory Visit (INDEPENDENT_AMBULATORY_CARE_PROVIDER_SITE_OTHER): Admitting: Nurse Practitioner

## 2024-06-20 ENCOUNTER — Encounter: Payer: Self-pay | Admitting: Nurse Practitioner

## 2024-06-20 VITALS — BP 116/68 | HR 64 | Ht 62.0 in

## 2024-06-20 DIAGNOSIS — E890 Postprocedural hypothyroidism: Secondary | ICD-10-CM | POA: Diagnosis not present

## 2024-06-20 MED ORDER — LEVOTHYROXINE SODIUM 100 MCG PO TABS: 100.0000 ug | ORAL_TABLET | Freq: Every day | ORAL | 3 refills | Status: AC

## 2024-06-20 NOTE — Progress Notes (Signed)
 Endocrinology Follow Up Note                                         06/20/2024, 3:18 PM  Subjective:   Subjective    Susan Davidson is a 73 y.o.-year-old female patient being seen in follow up after being seen in consultation for hypothyroidism referred by Norleen Lynwood ORN, MD.   Past Medical History:  Diagnosis Date   ANXIETY 02/12/2008   Qualifier: Diagnosis of  By: Norleen MD, Lynwood ORN    CVA (cerebral vascular accident) [facility])    DEPRESSION 02/12/2008   Qualifier: Diagnosis of  By: Helga Almarie Caldron    HYPERLIPIDEMIA 02/12/2008   Qualifier: Diagnosis of  By: Helga Almarie Caldron    HYPERTENSION 02/12/2008   Qualifier: Diagnosis of  By: Helga Almarie Caldron    HYPOTHYROIDISM 02/12/2008   Qualifier: Diagnosis of  By: Helga Almarie Caldron    Impaired glucose tolerance 08/27/2011   Left hemiparesis (HCC)    OSTEOPENIA 02/12/2008   Qualifier: Diagnosis of  By: Norleen MD, Lynwood ORN    VITAMIN D  DEFICIENCY 04/14/2010   Qualifier: Diagnosis of  By: Norleen MD, Lynwood ORN     Past Surgical History:  Procedure Laterality Date   IR RADIOLOGIST EVAL & MGMT  08/18/2021   LEFT HEART CATH AND CORONARY ANGIOGRAPHY N/A 08/03/2023   Procedure: LEFT HEART CATH AND CORONARY ANGIOGRAPHY;  Surgeon: Wendel Lurena POUR, MD;  Location: [facility] INVASIVE CV LAB;  Service: Cardiovascular;  Laterality: N/A;    Social History   Socioeconomic History   Marital status: Married    Spouse name: Not on file   Number of children: 0   Years of education: Not on file   Highest education level: Not on file  Occupational History   Occupation: disabled  Tobacco Use   Smoking status: Former    Current packs/day: 0.00    Types: Cigarettes    Quit date: 01/26/2021    Years since quitting: 3.4   Smokeless tobacco: Never  Vaping Use   Vaping status: Never Used  Substance and Sexual Activity   Alcohol use: Not Currently   Drug use: No    Sexual activity: Not Currently  Other Topics Concern   Not on file  Social History Narrative   Married   Social Drivers of Health   Financial Resource Strain: Low Risk  (05/02/2024)   Overall Financial Resource Strain (CARDIA)    Difficulty of Paying Living Expenses: Not hard at all  Food Insecurity: No Food Insecurity (05/02/2024)   Hunger Vital Sign    Worried About Running Out of Food in the Last Year: Never true    Ran Out of Food in the Last Year: Never true  Transportation Needs: No Transportation Needs (05/02/2024)   PRAPARE - Administrator, Civil Service (Medical): No    Lack of Transportation (Non-Medical): No  Physical Activity: Inactive (05/02/2024)   Exercise Vital Sign    Days of Exercise per Week: 0  days    Minutes of Exercise per Session: 0 min  Stress: No Stress Concern Present (05/02/2024)   [employer] of Occupational Health - Occupational Stress Questionnaire    Feeling of Stress: Not at all  Social Connections: Moderately Isolated (05/02/2024)   Social Connection and Isolation Panel    Frequency of Communication with Friends and Family: Twice a week    Frequency of Social Gatherings with Friends and Family: Once a week    Attends Religious Services: Never    Database administrator or Organizations: No    Attends Engineer, structural: Never    Marital Status: Married    Family History  Problem Relation Age of Onset   Heart disease Father    Bipolar disorder Sister    Diabetes Neg Hx     Outpatient Encounter Medications as of 06/20/2024  Medication Sig   acetaminophen  (TYLENOL ) 325 MG tablet Take 2 tablets (650 mg total) by mouth every 6 (six) hours as needed for mild pain or headache (fever >/= 101).   albuterol  (VENTOLIN  HFA) 108 (90 Base) MCG/ACT inhaler Inhale 2 puffs into the lungs every 6 (six) hours as needed for wheezing or shortness of breath.   alendronate  (FOSAMAX ) 70 MG tablet TAKE 1 TABLET EVERY 7 DAYS WITH A FULL GLASS  OF WATER ON AN EMPTY STOMACH   ALPRAZolam  (XANAX ) 0.5 MG tablet TAKE 1 TABLET TWICE DAILY AS NEEDED FOR ANXIETY   ascorbic acid  (VITAMIN C) 500 MG tablet Take 1 tablet (500 mg total) by mouth daily.   aspirin  EC 81 MG tablet Take 1 tablet (81 mg total) by mouth daily. Swallow whole.   Cholecalciferol (VITAMIN D -3 PO) Take 1 capsule by mouth at bedtime.   clopidogrel  (PLAVIX ) 75 MG tablet TAKE 1 TABLET EVERY DAY   Coenzyme Q10 (COQ10 PO) Take 1 tablet by mouth at bedtime.   divalproex  (DEPAKOTE ) 250 MG DR tablet Take 1 tablet by mouth See admin instructions. 1 tablet in the morning and 2 tablets every evening   folic acid  (FOLVITE ) 800 MCG tablet Take 400 mcg by mouth at bedtime.   isosorbide  mononitrate (IMDUR ) 30 MG 24 hr tablet TAKE 1 TABLET EVERY DAY   lactose free nutrition (BOOST) LIQD Take 237 mLs by mouth 2 (two) times daily between meals. At least one a day and sometimes more   linaclotide  (LINZESS ) 145 MCG CAPS capsule Take 1 capsule (145 mcg total) by mouth daily.   MAGNESIUM OXIDE, ELEMENTAL, PO Take 1 tablet by mouth at bedtime.   memantine  (NAMENDA ) 5 MG tablet Take 1 tablet (5 mg total) by mouth 2 (two) times daily.   metoprolol  tartrate (LOPRESSOR ) 25 MG tablet TAKE 1/2 TABLET TWICE DAILY   Multiple Vitamin (MULTIVITAMIN) tablet Take 1 tablet by mouth at bedtime.   nitroGLYCERIN  (NITROSTAT ) 0.4 MG SL tablet Place 1 tablet (0.4 mg total) under the tongue every 5 (five) minutes as needed for chest pain. Up to 3 doses in one 24 hour period   pantoprazole  (PROTONIX ) 40 MG tablet Take 1 tablet (40 mg total) by mouth daily.   PARoxetine  (PAXIL ) 40 MG tablet Take 40 mg by mouth every morning.   QUEtiapine  (SEROQUEL ) 50 MG tablet Take 50 mg by mouth at bedtime.   rosuvastatin  (CRESTOR ) 20 MG tablet TAKE 1 TABLET AT BEDTIME   [DISCONTINUED] levothyroxine  (SYNTHROID ) 100 MCG tablet Take 1 tablet (100 mcg total) by mouth daily.   amoxicillin -clavulanate (AUGMENTIN ) 400-57 MG/5ML suspension  Take 10 mLs (800 mg  total) by mouth every 12 (twelve) hours. (Patient not taking: Reported on 06/20/2024)   levothyroxine  (SYNTHROID ) 100 MCG tablet Take 1 tablet (100 mcg total) by mouth daily.   No facility-administered encounter medications on file as of 06/20/2024.    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   Influenza Vac Split Quad Other (See Comments)    Fatigue, mimics a super bad flu   VACCINATION STATUS: Immunization History  Administered Date(s) Administered   Influenza-Unspecified 08/18/2021   Moderna Sars-Covid-2 Vaccination 12/02/2019   PNEUMOCOCCAL CONJUGATE-20 05/29/2023   Tdap 10/19/2012     HPI   SHARLETTE JANSMA was diagnosed with hyperthyroidism at approximate age of 20 years, which required RAI ablation and subsequent initiation of thyroid  hormone replacement. she was given various doses of Levothyroxine  over the years, currently on 75 mcg po daily before breakfast.  She has been taking this medication consistently.  Her previsit thyroid  function tests are consistent with appropriate replacement.  She has no new complaints.  She remains wheelchair-bound. She is living at home, had previously been in/out of rehab facilitys in the past.  Pt denies feeling nodules in neck, hoarseness, dysphagia/odynophagia, SOB with lying down.  she denies family history of thyroid  disorders.  No family history of thyroid  cancer.   No recent use of iodine supplements.  Denies use of Biotin containing supplements.  She has never had any previous imaging of her thyroid  in the past.  I reviewed her chart and she also has a history of CVA, encephalopathy, CKD, depression, HLD, Osteopenia, vitamin D  deficiency.   Review of  systems  Constitutional: + Minimally fluctuating body weight,  current Body mass index is 24.69 kg/m. , no fatigue, no subjective hyperthermia, + subjective hypothermia- fluctuating Eyes: no blurry vision, no xerophthalmia ENT: no sore throat, no nodules palpated in throat, no dysphagia/odynophagia, no hoarseness Cardiovascular: no chest pain, no shortness of breath, no palpitations, no leg swelling Respiratory: no cough, no shortness of breath Gastrointestinal: no nausea/vomiting/diarrhea Musculoskeletal: no muscle/joint aches, WC bound from previous stroke Skin: no rashes, no hyperemia Neurological: no tremors, no numbness, no tingling, no dizziness Psychiatric: no depression, no anxiety   Objective:   Objective     BP 116/68 (BP Location: Right Arm, Patient Position: Sitting, Cuff Size: Large)   Pulse 64   Ht 5' 2 (1.575 m)   BMI 24.69 kg/m  Wt Readings from Last 3 Encounters:  06/05/24 135 lb (61.2 kg)  05/02/24 140 lb (63.5 kg)  02/26/24 132 lb 11.5 oz (60.2 kg)    BP Readings from Last 3 Encounters:  06/20/24 116/68  06/05/24 115/70  02/28/24 101/63      Physical Exam- Limited  Constitutional:  Body mass index is 24.69 kg/m. , not in acute distress, normal state of mind, pale complexion Musculoskeletal: no gross deformities, WC bound after past CVA    CMP ( most recent) CMP     Component Value Date/Time   NA 140 02/28/2024 0453   NA 144 08/17/2023 1537   K 3.4 (L) 02/28/2024 0453   CL 107 02/28/2024 0453   CO2 28 02/28/2024 0453   GLUCOSE 97 02/28/2024 0453   BUN 17 02/28/2024 0453   BUN 22 08/17/2023 1537   CREATININE 0.83 02/28/2024  0453   CALCIUM  8.1 (L) 02/28/2024 0453   PROT 7.0 02/01/2024 1546   ALBUMIN 4.0 02/01/2024 1546   AST 17 02/01/2024 1546   ALT 11 02/01/2024 1546   ALKPHOS 43 02/01/2024 1546   BILITOT 0.2 02/01/2024 1546   GFRNONAA >60 02/28/2024 0453   GFRAA 73 02/12/2008 1525     Diabetic Labs (most recent): Lab Results   Component Value Date   HGBA1C 6.3 02/01/2024   HGBA1C 6.3 04/10/2023   HGBA1C 6.1 01/06/2022     Lipid Panel ( most recent) Lipid Panel     Component Value Date/Time   CHOL 155 02/01/2024 1546   TRIG 235.0 (H) 02/01/2024 1546   HDL 48.80 02/01/2024 1546   CHOLHDL 3 02/01/2024 1546   VLDL 47.0 (H) 02/01/2024 1546   LDLCALC 59 02/01/2024 1546   LDLDIRECT 137.5 10/19/2012 1106       Lab Results  Component Value Date   TSH 1.230 06/10/2024   TSH 8.16 (H) 02/01/2024   TSH 4.850 (H) 01/01/2024   TSH 3.440 11/01/2023   TSH 3.810 07/14/2023   TSH 0.839 05/17/2023   TSH 4.86 04/10/2023   TSH 4.010 12/13/2022   TSH 2.270 05/30/2022   TSH 1.348 01/18/2022   FREET4 1.40 06/10/2024   FREET4 1.22 01/01/2024   FREET4 1.47 11/01/2023   FREET4 1.45 07/14/2023   FREET4 1.95 (H) 05/17/2023   FREET4 1.30 12/13/2022   FREET4 1.48 05/30/2022   FREET4 0.66 01/28/2021   FREET4 0.38 (L) 11/09/2020     Latest Reference Range & Units 11/01/23 10:16 01/01/24 09:19 02/01/24 15:46 06/10/24 08:21  TSH 0.450 - 4.500 uIU/mL 3.440 4.850 (H) 8.16 (H) 1.230  T4,Free(Direct) 0.82 - 1.77 ng/dL 8.52 8.77  8.59  (H): Data is abnormally high   Assessment & Plan:   ASSESSMENT / PLAN:  1. Hypothyroidism-s/p RAI ablation for hyperthyroidism many years ago   Patient with long-standing hypothyroidism, on Levothyroxine  therapy. On physical exam, patient does not have gross goiter, thyroid  nodules, or neck compression symptoms.     -Her TFTs are consistent with appropriate hormone replacement.  She is advised to continue Levothyroxine  100 mcg po daily before breakfast.   Will plan for her to have TFTs prior to next visit and adjust dose accordingly.   - We discussed about the correct intake of her thyroid  hormone, on empty stomach at fasting, with water, separated by at least 30 minutes from breakfast and other medications,  and separated by more than 4 hours from calcium , iron, multivitamins, acid  reflux medications (PPIs). -Patient is made aware of the fact that thyroid  hormone replacement is needed for life, dose to be adjusted by periodic monitoring of thyroid  function tests.     I spent  17  minutes in the care of the patient today including review of labs from Thyroid  Function, CMP, and other relevant labs ; imaging/biopsy records (current and previous including abstractions from other facilities); face-to-face time discussing  her lab results and symptoms, medications doses, her options of short and long term treatment based on the latest standards of care / guidelines;   and documenting the encounter.  Levada JAYSON Goldman  participated in the discussions, expressed understanding, and voiced agreement with the above plans.  All questions were answered to her satisfaction. she is encouraged to contact clinic should she have any questions or concerns prior to her return visit.    FOLLOW UP PLAN:  Return in about 6 months (around 12/18/2024) for Thyroid  follow up,  Previsit labs.  Benton Rio, [facility] [address] [place], [place] [address] Phone: [phone] Fax: [phone]  06/20/2024, 3:18 PM

## 2024-06-20 NOTE — Patient Instructions (Signed)

## 2024-06-21 ENCOUNTER — Other Ambulatory Visit (INDEPENDENT_AMBULATORY_CARE_PROVIDER_SITE_OTHER): Payer: Self-pay | Admitting: Gastroenterology

## 2024-06-21 DIAGNOSIS — K5904 Chronic idiopathic constipation: Secondary | ICD-10-CM

## 2024-06-24 DIAGNOSIS — I69354 Hemiplegia and hemiparesis following cerebral infarction affecting left non-dominant side: Secondary | ICD-10-CM | POA: Diagnosis not present

## 2024-06-24 DIAGNOSIS — Z515 Encounter for palliative care: Secondary | ICD-10-CM | POA: Diagnosis not present

## 2024-06-24 DIAGNOSIS — I6340 Cerebral infarction due to embolism of unspecified cerebral artery: Secondary | ICD-10-CM | POA: Diagnosis not present

## 2024-06-26 ENCOUNTER — Other Ambulatory Visit: Payer: Self-pay

## 2024-06-26 MED ORDER — MEMANTINE HCL 5 MG PO TABS: 5.0000 mg | ORAL_TABLET | Freq: Two times a day (BID) | ORAL | 3 refills | Status: AC

## 2024-06-26 NOTE — Telephone Encounter (Signed)
 Medication has since been reordered to mail order pharmacy

## 2024-07-29 DIAGNOSIS — I69354 Hemiplegia and hemiparesis following cerebral infarction affecting left non-dominant side: Secondary | ICD-10-CM | POA: Diagnosis not present

## 2024-07-29 DIAGNOSIS — I6340 Cerebral infarction due to embolism of unspecified cerebral artery: Secondary | ICD-10-CM | POA: Diagnosis not present

## 2024-07-29 DIAGNOSIS — Z515 Encounter for palliative care: Secondary | ICD-10-CM | POA: Diagnosis not present

## 2024-08-05 ENCOUNTER — Encounter: Admitting: Internal Medicine

## 2024-08-22 ENCOUNTER — Telehealth: Payer: Self-pay

## 2024-08-22 NOTE — Telephone Encounter (Signed)
 Copied from CRM #[id number]. Topic: General - Other >> Aug 22, 2024  9:11 AM Roselie BROCKS wrote: Reason for CRM: Susan Davidson from the [facility] program inquiring if forms have been received and if have been signed off on for the patient. Please return a call to Mrs Davidson phone number [phone]

## 2024-08-23 NOTE — Telephone Encounter (Signed)
I have not received these forms yet..

## 2024-09-04 ENCOUNTER — Encounter: Payer: Self-pay | Admitting: Internal Medicine

## 2024-09-04 ENCOUNTER — Ambulatory Visit: Admitting: Internal Medicine

## 2024-09-04 VITALS — BP 126/78 | HR 64 | Temp 98.5°F | Ht 62.0 in | Wt 133.0 lb

## 2024-09-04 DIAGNOSIS — N1832 Chronic kidney disease, stage 3b: Secondary | ICD-10-CM | POA: Diagnosis not present

## 2024-09-04 DIAGNOSIS — E538 Deficiency of other specified B group vitamins: Secondary | ICD-10-CM

## 2024-09-04 DIAGNOSIS — R7302 Impaired glucose tolerance (oral): Secondary | ICD-10-CM | POA: Diagnosis not present

## 2024-09-04 DIAGNOSIS — E559 Vitamin D deficiency, unspecified: Secondary | ICD-10-CM

## 2024-09-04 DIAGNOSIS — I10 Essential (primary) hypertension: Secondary | ICD-10-CM

## 2024-09-04 DIAGNOSIS — E785 Hyperlipidemia, unspecified: Secondary | ICD-10-CM | POA: Diagnosis not present

## 2024-09-04 LAB — LIPID PANEL
Cholesterol: 161 mg/dL (ref 0–200)
HDL: 38 mg/dL — ABNORMAL LOW (ref >39.00)
LDL Cholesterol: 69 mg/dL (ref 0–99)
NonHDL: 122.75
Total CHOL/HDL Ratio: 4
Triglycerides: 270 mg/dL — ABNORMAL HIGH (ref 0.0–149.0)
VLDL: 54 mg/dL — ABNORMAL HIGH (ref 0.0–40.0)

## 2024-09-04 LAB — HEPATIC FUNCTION PANEL
ALT: 9 U/L (ref 0–35)
AST: 15 U/L (ref 0–37)
Albumin: 3.6 g/dL (ref 3.5–5.2)
Alkaline Phosphatase: 44 U/L (ref 39–117)
Bilirubin, Direct: 0 mg/dL (ref 0.0–0.3)
Total Bilirubin: 0.3 mg/dL (ref 0.2–1.2)
Total Protein: 7 g/dL (ref 6.0–8.3)

## 2024-09-04 LAB — CBC WITH DIFFERENTIAL/PLATELET
Basophils Absolute: 0 K/uL (ref 0.0–0.1)
Basophils Relative: 0.4 % (ref 0.0–3.0)
Eosinophils Absolute: 0.2 K/uL (ref 0.0–0.7)
Eosinophils Relative: 3.2 % (ref 0.0–5.0)
HCT: 37.3 % (ref 36.0–46.0)
Hemoglobin: 12.4 g/dL (ref 12.0–15.0)
Lymphocytes Relative: 43.1 % (ref 12.0–46.0)
Lymphs Abs: 3.1 K/uL (ref 0.7–4.0)
MCHC: 33.2 g/dL (ref 30.0–36.0)
MCV: 99.9 fl (ref 78.0–100.0)
Monocytes Absolute: 0.5 K/uL (ref 0.1–1.0)
Monocytes Relative: 7.2 % (ref 3.0–12.0)
Neutro Abs: 3.3 K/uL (ref 1.4–7.7)
Neutrophils Relative %: 46.1 % (ref 43.0–77.0)
Platelets: 218 K/uL (ref 150.0–400.0)
RBC: 3.73 Mil/uL — ABNORMAL LOW (ref 3.87–5.11)
RDW: 15.3 % (ref 11.5–15.5)
WBC: 7.2 K/uL (ref 4.0–10.5)

## 2024-09-04 LAB — BASIC METABOLIC PANEL WITH GFR
BUN: 35 mg/dL — ABNORMAL HIGH (ref 6–23)
CO2: 32 meq/L (ref 19–32)
Calcium: 9 mg/dL (ref 8.4–10.5)
Chloride: 102 meq/L (ref 96–112)
Creatinine, Ser: 1.09 mg/dL (ref 0.40–1.20)
GFR: 49.73 mL/min — ABNORMAL LOW (ref >60.00)
Glucose, Bld: 95 mg/dL (ref 70–99)
Potassium: 4.8 meq/L (ref 3.5–5.1)
Sodium: 139 meq/L (ref 135–145)

## 2024-09-04 LAB — VITAMIN D 25 HYDROXY (VIT D DEFICIENCY, FRACTURES): VITD: 59.3 ng/mL (ref 30.00–100.00)

## 2024-09-04 NOTE — Assessment & Plan Note (Signed)
 Lab Results  Component Value Date   LDLCALC 59 02/01/2024   Stable, pt to continue current statin crestor  20 mg qhs

## 2024-09-04 NOTE — Assessment & Plan Note (Signed)
 BP Readings from Last 3 Encounters:  09/04/24 126/78  06/20/24 116/68  06/05/24 115/70   Stable, pt to continue medical treatment lopressor  12.5 qd

## 2024-09-04 NOTE — Assessment & Plan Note (Signed)
 Last vitamin D  Lab Results  Component Value Date   VD25OH 73.57 02/01/2024   Stable, cont oral replacement

## 2024-09-04 NOTE — Patient Instructions (Signed)

## 2024-09-04 NOTE — Assessment & Plan Note (Addendum)
 Ckd3b  Lab Results  Component Value Date   CREATININE 0.83 02/28/2024   Stable overall, cont to avoid nephrotoxins

## 2024-09-04 NOTE — Progress Notes (Signed)
 Patient ID: Susan Davidson, female   DOB: 16-Apr-1951, 73 y.o.   MRN: [id number]        Chief Complaint: follow up HTN, HLD and hyperglycemia, low vit d,  ckd3a, low b12       HPI:  Susan Davidson is a 73 y.o. female here with son doing an excellent of supporting her in the home; she remains wheelchair bound and requires assist with toileting, cooking.  Also has lost several lbs but son is trying to have her take 2-4 cans boost per day.  Pt denies chest pain, increased sob or doe, wheezing, orthopnea, PND, increased LE swelling, palpitations, dizziness or syncope.   Pt denies polydipsia, polyuria, or new focal neuro s/s.        Wt Readings from Last 3 Encounters:  09/04/24 133 lb (60.3 kg)  06/05/24 135 lb (61.2 kg)  05/02/24 140 lb (63.5 kg)   BP Readings from Last 3 Encounters:  09/04/24 126/78  06/20/24 116/68  06/05/24 115/70         Past Medical History:  Diagnosis Date   ANXIETY 02/12/2008   Qualifier: Diagnosis of  By: Norleen MD, Lynwood ORN    CVA (cerebral vascular accident) [facility])    DEPRESSION 02/12/2008   Qualifier: Diagnosis of  By: Helga Almarie Caldron    HYPERLIPIDEMIA 02/12/2008   Qualifier: Diagnosis of  By: Helga Almarie Caldron    HYPERTENSION 02/12/2008   Qualifier: Diagnosis of  By: Helga Almarie Caldron    HYPOTHYROIDISM 02/12/2008   Qualifier: Diagnosis of  By: Helga Almarie Caldron    Impaired glucose tolerance 08/27/2011   Left hemiparesis (HCC)    OSTEOPENIA 02/12/2008   Qualifier: Diagnosis of  By: Norleen MD, Lynwood ORN    VITAMIN D  DEFICIENCY 04/14/2010   Qualifier: Diagnosis of  By: Norleen MD, Lynwood ORN    Past Surgical History:  Procedure Laterality Date   IR RADIOLOGIST EVAL & MGMT  08/18/2021   LEFT HEART CATH AND CORONARY ANGIOGRAPHY N/A 08/03/2023   Procedure: LEFT HEART CATH AND CORONARY ANGIOGRAPHY;  Surgeon: Wendel Lurena POUR, MD;  Location: [facility];  Service: Cardiovascular;  Laterality: N/A;    reports that she quit smoking about 3  years ago. Her smoking use included cigarettes. She has never used smokeless tobacco. She reports that she does not currently use alcohol. She reports that she does not use drugs. family history includes Bipolar disorder in her sister; Heart disease in her father. Allergies  Allergen Reactions   Fire Ant (Solenopsis Invicta) Anaphylaxis   Aleve [Naproxen] Nausea Only   Covid-19 Mrna Vaccine [employer]on[employer]) [Covid-19 Mrna Vacc (Moderna)] Other (See Comments)    Just the first round of moderna Covid vaccine, sluggish, uncoordinated, and weak, similar to flu.   Influenza Vac Split Quad Other (See Comments)    Fatigue, mimics a super bad flu   Current Outpatient Medications on File Prior to Visit  Medication Sig Dispense Refill   acetaminophen  (TYLENOL ) 325 MG tablet Take 2 tablets (650 mg total) by mouth every 6 (six) hours as needed for mild pain or headache (fever >/= 101). 12 tablet 2   albuterol  (VENTOLIN  HFA) 108 (90 Base) MCG/ACT inhaler Inhale 2 puffs into the lungs every 6 (six) hours as needed for wheezing or shortness of breath. 17 each 2   alendronate  (FOSAMAX ) 70 MG tablet TAKE 1 TABLET EVERY 7 DAYS WITH A FULL GLASS OF WATER ON AN EMPTY STOMACH 12 tablet 3   ALPRAZolam  (XANAX )  0.5 MG tablet TAKE 1 TABLET TWICE DAILY AS NEEDED FOR ANXIETY 60 tablet 2   ascorbic acid  (VITAMIN C) 500 MG tablet Take 1 tablet (500 mg total) by mouth daily. 30 tablet 2   Cholecalciferol (VITAMIN D -3 PO) Take 1 capsule by mouth at bedtime.     clopidogrel  (PLAVIX ) 75 MG tablet TAKE 1 TABLET EVERY DAY 90 tablet 3   Coenzyme Q10 (COQ10 PO) Take 1 tablet by mouth at bedtime.     divalproex  (DEPAKOTE ) 250 MG DR tablet Take 1 tablet by mouth See admin instructions. 1 tablet in the morning and 2 tablets every evening     folic acid  (FOLVITE ) 800 MCG tablet Take 400 mcg by mouth at bedtime.     isosorbide  mononitrate (IMDUR ) 30 MG 24 hr tablet TAKE 1 TABLET EVERY DAY 90 tablet 2   lactose free nutrition (BOOST) LIQD  Take 237 mLs by mouth 2 (two) times daily between meals. At least one a day and sometimes more     levothyroxine  (SYNTHROID ) 100 MCG tablet Take 1 tablet (100 mcg total) by mouth daily. 90 tablet 3   LINZESS  145 MCG CAPS capsule TAKE 1 CAPSULE EVERY DAY 90 capsule 3   MAGNESIUM OXIDE, ELEMENTAL, PO Take 1 tablet by mouth at bedtime.     memantine  (NAMENDA ) 5 MG tablet Take 1 tablet (5 mg total) by mouth 2 (two) times daily. 180 tablet 3   metoprolol  tartrate (LOPRESSOR ) 25 MG tablet TAKE 1/2 TABLET TWICE DAILY 90 tablet 3   Multiple Vitamin (MULTIVITAMIN) tablet Take 1 tablet by mouth at bedtime.     nitroGLYCERIN  (NITROSTAT ) 0.4 MG SL tablet Place 1 tablet (0.4 mg total) under the tongue every 5 (five) minutes as needed for chest pain. Up to 3 doses in one 24 hour period 45 tablet 3   pantoprazole  (PROTONIX ) 40 MG tablet Take 1 tablet (40 mg total) by mouth daily. 90 tablet 3   PARoxetine  (PAXIL ) 40 MG tablet Take 40 mg by mouth every morning.     QUEtiapine  (SEROQUEL ) 50 MG tablet Take 50 mg by mouth at bedtime.     rosuvastatin  (CRESTOR ) 20 MG tablet TAKE 1 TABLET AT BEDTIME 90 tablet 3   No current facility-administered medications on file prior to visit.        ROS:  All others reviewed and negative.  Objective        PE:  BP 126/78 (BP Location: Left Arm, Patient Position: Sitting, Cuff Size: Normal)   Pulse 64   Temp 98.5 F (36.9 C) (Oral)   Ht 5' 2 (1.575 m)   Wt 133 lb (60.3 kg)   SpO2 92%   BMI 24.33 kg/m                 Constitutional: Pt appears in NAD               HENT: Head: NCAT.                Right Ear: External ear normal.                 Left Ear: External ear normal.                Eyes: . Pupils are equal, round, and reactive to light. Conjunctivae and EOM are normal               Nose: without d/c or deformity  Neck: Neck supple. Gross normal ROM               Cardiovascular: Normal rate and regular rhythm.                 Pulmonary/Chest:  Effort normal and breath sounds without rales or wheezing.                Abd:  Soft, NT, ND, + BS, no organomegaly               Neurological: Pt is alert. At baseline orientation, motor grossly intact               Skin: Skin is warm. No rashes, no other new lesions, LE edema - none               Psychiatric: Pt behavior is normal without agitation   Micro: none  Cardiac tracings I have personally interpreted today:  none  Pertinent Radiological findings (summarize): none   Lab Results  Component Value Date   WBC 6.6 02/28/2024   HGB 11.0 (L) 02/28/2024   HCT 34.7 (L) 02/28/2024   PLT 151 02/28/2024   GLUCOSE 97 02/28/2024   CHOL 155 02/01/2024   TRIG 235.0 (H) 02/01/2024   HDL 48.80 02/01/2024   LDLDIRECT 137.5 10/19/2012   LDLCALC 59 02/01/2024   ALT 11 02/01/2024   AST 17 02/01/2024   NA 140 02/28/2024   K 3.4 (L) 02/28/2024   CL 107 02/28/2024   CREATININE 0.83 02/28/2024   BUN 17 02/28/2024   CO2 28 02/28/2024   TSH 1.230 06/10/2024   INR 0.9 08/02/2023   HGBA1C 6.3 02/01/2024   Assessment/Plan:  Susan Davidson is a 73 y.o. White or Caucasian [1] female with  has a past medical history of ANXIETY (02/12/2008), CVA (cerebral vascular accident) (HCC), DEPRESSION (02/12/2008), HYPERLIPIDEMIA (02/12/2008), HYPERTENSION (02/12/2008), HYPOTHYROIDISM (02/12/2008), Impaired glucose tolerance (08/27/2011), Left hemiparesis (HCC), OSTEOPENIA (02/12/2008), and VITAMIN D  DEFICIENCY (04/14/2010).  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  Lab Results  Component Value Date   CREATININE 0.83 02/28/2024   Stable overall, cont to avoid nephrotoxins    Vitamin D  deficiency Last vitamin D  Lab Results  Component Value Date   VD25OH 73.57 02/01/2024   Stable, cont oral replacement   Impaired glucose tolerance Lab Results  Component Value Date   HGBA1C 6.3 02/01/2024   Stable, pt to continue current medical treatment  - diet, wt  control   HLD (hyperlipidemia) Lab Results  Component Value Date   LDLCALC 59 02/01/2024   Stable, pt to continue current statin crestor  20 mg qhs   Essential hypertension BP Readings from Last 3 Encounters:  09/04/24 126/78  06/20/24 116/68  06/05/24 115/70   Stable, pt to continue medical treatment lopressor  12.5 qd   B12 deficiency Lab Results  Component Value Date   VITAMINB12 1,228 (H) 02/01/2024   Stable, cont oral replacement - b12 1000 mcg qd  Followup: Return in about 6 months (around 03/05/2025).  Lynwood Rush, MD 09/04/2024 7:38 PM [facility] [place] [facility] Internal Medicine

## 2024-09-04 NOTE — Assessment & Plan Note (Signed)
 Lab Results  Component Value Date   HGBA1C 6.3 02/01/2024   Stable, pt to continue current medical treatment  - diet, wt control

## 2024-09-04 NOTE — Assessment & Plan Note (Signed)
 Lab Results  Component Value Date   VITAMINB12 1,228 (H) 02/01/2024   Stable, cont oral replacement - b12 1000 mcg qd

## 2024-09-05 ENCOUNTER — Ambulatory Visit: Payer: Self-pay | Admitting: Internal Medicine

## 2024-09-05 LAB — HEMOGLOBIN A1C: Hgb A1c MFr Bld: 6.2 % (ref 4.6–6.5)

## 2024-09-05 NOTE — Progress Notes (Signed)
 The test results show that your current treatment is OK, as the tests are stable.  Please continue the same plan.  There is no other need for change of treatment or further evaluation based on these results, at this time.  thanks

## 2024-09-24 ENCOUNTER — Telehealth: Payer: Self-pay

## 2024-09-24 ENCOUNTER — Ambulatory Visit: Admitting: Podiatry

## 2024-09-24 NOTE — Telephone Encounter (Signed)
Form has been received.

## 2024-09-24 NOTE — Telephone Encounter (Signed)
 Copied from CRM #[id number]. Topic: General - Other >> Aug 22, 2024  9:11 AM Roselie BROCKS wrote: Reason for CRM: Delon Levins from the [employer] program inquiring if forms have been received and if have been signed off on for the patient. Please return a call to Mrs Levins phone number [phone] >> Sep 24, 2024  9:14 AM Pinkey ORN wrote: Delon Levins - [employer] [phone]  Called on behalf of patient, states she still hasn't heard anything back in regards to the forms she sent over for patient's supplies. I verified office fax number with her and she stated she's going to fax it over again today.

## 2024-09-24 NOTE — Telephone Encounter (Signed)
 Will be on the lookout for the forms today.

## 2024-09-25 NOTE — Telephone Encounter (Signed)
 Form has been placed on provider desk.

## 2024-10-01 NOTE — Telephone Encounter (Signed)
Form has been faxed back today

## 2024-10-04 ENCOUNTER — Other Ambulatory Visit (HOSPITAL_COMMUNITY): Payer: Self-pay | Admitting: Neurosurgery

## 2024-10-04 DIAGNOSIS — I671 Cerebral aneurysm, nonruptured: Secondary | ICD-10-CM

## 2024-10-16 ENCOUNTER — Ambulatory Visit (HOSPITAL_COMMUNITY)
Admission: RE | Admit: 2024-10-16 | Discharge: 2024-10-16 | Disposition: A | Discharge status: Home / Self Care | Source: Ambulatory Visit | Attending: Neurosurgery | Admitting: Neurosurgery

## 2024-10-16 DIAGNOSIS — I671 Cerebral aneurysm, nonruptured: Secondary | ICD-10-CM | POA: Diagnosis present

## 2024-10-23 ENCOUNTER — Encounter: Payer: Self-pay | Admitting: Podiatry

## 2024-10-23 ENCOUNTER — Ambulatory Visit: Admitting: Podiatry

## 2024-10-23 DIAGNOSIS — M79674 Pain in right toe(s): Secondary | ICD-10-CM | POA: Diagnosis not present

## 2024-10-23 DIAGNOSIS — M79675 Pain in left toe(s): Secondary | ICD-10-CM | POA: Diagnosis not present

## 2024-10-23 DIAGNOSIS — B351 Tinea unguium: Secondary | ICD-10-CM | POA: Diagnosis not present

## 2024-10-23 DIAGNOSIS — N1831 Chronic kidney disease, stage 3a: Secondary | ICD-10-CM | POA: Diagnosis not present

## 2024-10-23 NOTE — Progress Notes (Signed)
 This patient presents to the office with chief complaint of long thick painful nails.  Patient says the nails are painful walking and wearing shoes.  This patient is unable to self treat.  This patient is unable to trim her nails since she is unable to reach her nails.  She present to the office in a wheelchair accompanied by female caregiver. She presents to the office for preventative foot care services.  General Appearance  Alert, conversant and in no acute stress.  Vascular  Dorsalis pedis and posterior tibial  pulses are weakly  palpable  bilaterally.  Capillary return is within normal limits  bilaterally. Temperature is within normal limits  bilaterally. Purplish feet  B/L.  Neurologic  Senn-Weinstein monofilament wire test within normal limits  bilaterally. Muscle power within normal limits bilaterally.  Nails Thick disfigured discolored nails with subungual debris  from hallux to fifth toes bilaterally. No evidence of bacterial infection or drainage bilaterally.  Orthopedic  No limitations of motion  feet .  No crepitus or effusions noted.  No bony pathology or digital deformities noted.  Skin  normotropic skin with no porokeratosis noted bilaterally.  No signs of infections or ulcers noted.     Onychomycosis  Nails  B/L.  Pain in right toes  Pain in left toes  Debridement of nails both feet followed trimming the nails with dremel tool.    RTC 6   months.   Cordella Bold DPM

## 2024-10-30 ENCOUNTER — Ambulatory Visit: Admitting: Family Medicine

## 2024-10-31 ENCOUNTER — Ambulatory Visit (INDEPENDENT_AMBULATORY_CARE_PROVIDER_SITE_OTHER): Admitting: Internal Medicine

## 2024-10-31 ENCOUNTER — Encounter: Payer: Self-pay | Admitting: Internal Medicine

## 2024-10-31 VITALS — BP 124/80 | Temp 98.4°F | Ht 62.0 in

## 2024-10-31 DIAGNOSIS — F5101 Primary insomnia: Secondary | ICD-10-CM | POA: Diagnosis not present

## 2024-10-31 DIAGNOSIS — R7302 Impaired glucose tolerance (oral): Secondary | ICD-10-CM | POA: Diagnosis not present

## 2024-10-31 DIAGNOSIS — I10 Essential (primary) hypertension: Secondary | ICD-10-CM | POA: Diagnosis not present

## 2024-10-31 DIAGNOSIS — M48061 Spinal stenosis, lumbar region without neurogenic claudication: Secondary | ICD-10-CM | POA: Diagnosis not present

## 2024-10-31 DIAGNOSIS — G4700 Insomnia, unspecified: Secondary | ICD-10-CM | POA: Insufficient documentation

## 2024-10-31 MED ORDER — GABAPENTIN 300 MG PO CAPS: 300.0000 mg | ORAL_CAPSULE | Freq: Every day | ORAL | 1 refills | Status: AC

## 2024-10-31 NOTE — Assessment & Plan Note (Signed)
 Today with high suspicion of flare leading to neuritic pain to the legs left > right, only at bedtime sleeping hours - ok for trial gabapentin  300 mg at bedtime prn

## 2024-10-31 NOTE — Progress Notes (Signed)
 Patient ID: Susan Davidson, female   DOB: July 29, 1951, 74 y.o.   MRN: [id number]        Chief Complaint: follow up lbp with bilateral sciatica, insomnia, htn, hyperglycemia       HPI:  Susan Davidson is a 74 y.o. female here with c/o worsening lbp with bilateral sciatica like pain in the past 2 wks, worse to lie down at night, seems to have little to no pain during daytime. Also has mild worsening insomnia not all attributable to pain it seems. Pt denies chest pain, increased sob or doe, wheezing, orthopnea, PND, increased LE swelling, palpitations, dizziness or syncope.   Pt denies polydipsia, polyuria, or new focal neuro s/s.           Wt Readings from Last 3 Encounters:  09/04/24 133 lb (60.3 kg)  06/05/24 135 lb (61.2 kg)  05/02/24 140 lb (63.5 kg)   BP Readings from Last 3 Encounters:  10/31/24 124/80  09/04/24 126/78  06/20/24 116/68         Past Medical History:  Diagnosis Date   ANXIETY 02/12/2008   Qualifier: Diagnosis of  By: Norleen MD, Lynwood ORN    CVA (cerebral vascular accident) [facility])    DEPRESSION 02/12/2008   Qualifier: Diagnosis of  By: Helga Almarie Caldron    HYPERLIPIDEMIA 02/12/2008   Qualifier: Diagnosis of  By: Helga Almarie Caldron    HYPERTENSION 02/12/2008   Qualifier: Diagnosis of  By: Helga Almarie Caldron    HYPOTHYROIDISM 02/12/2008   Qualifier: Diagnosis of  By: Helga Almarie Caldron    Impaired glucose tolerance 08/27/2011   Left hemiparesis (HCC)    OSTEOPENIA 02/12/2008   Qualifier: Diagnosis of  By: Norleen MD, Lynwood ORN    VITAMIN D  DEFICIENCY 04/14/2010   Qualifier: Diagnosis of  By: Norleen MD, Lynwood ORN    Past Surgical History:  Procedure Laterality Date   IR RADIOLOGIST EVAL & MGMT  08/18/2021   LEFT HEART CATH AND CORONARY ANGIOGRAPHY N/A 08/03/2023   Procedure: LEFT HEART CATH AND CORONARY ANGIOGRAPHY;  Surgeon: Wendel Lurena POUR, MD;  Location: [facility] INVAS[facility] CV LAB;  Service: Cardiovascular;  Laterality: N/A;    reports that she quit  smoking about 3 years ago. Her smoking use included cigarettes. She has never used smokeless tobacco. She reports that she does not currently use alcohol. She reports that she does not use drugs. family history includes Bipolar disorder in her sister; Heart disease in her father. Allergies[1] Medications Ordered Prior to Encounter[2]      ROS:  All others reviewed and negative.  Objective        PE:  BP 124/80 (BP Location: Right Arm, Patient Position: Sitting, Cuff Size: Normal)   Temp 98.4 F (36.9 C) (Oral)   Ht 5' 2 (1.575 m)   BMI 24.33 kg/m                 Constitutional: Pt appears in NAD               HENT: Head: NCAT.                Right Ear: External ear normal.                 Left Ear: External ear normal.                Eyes: . Pupils are equal, round, and reactive to light. Conjunctivae and EOM are normal  Nose: without d/c or deformity               Neck: Neck supple. Gross normal ROM               Cardiovascular: Normal rate and regular rhythm.                 Pulmonary/Chest: Effort normal and breath sounds without rales or wheezing.                Abd:  Soft, NT, ND, + BS, no organomegaly               Neurological: Pt is alert. At baseline orientation, motor grossly intact               Skin: Skin is warm. No rashes, no other new lesions, LE edema - none               Psychiatric: Pt behavior is normal without agitation   Micro: none  Cardiac tracings I have personally interpreted today:  none  Pertinent Radiological findings (summarize): none   Lab Results  Component Value Date   WBC 7.2 09/04/2024   HGB 12.4 09/04/2024   HCT 37.3 09/04/2024   PLT 218.0 09/04/2024   GLUCOSE 95 09/04/2024   CHOL 161 09/04/2024   TRIG 270.0 (H) 09/04/2024   HDL 38.00 (L) 09/04/2024   LDLDIRECT 137.5 10/19/2012   LDLCALC 69 09/04/2024   ALT 9 09/04/2024   AST 15 09/04/2024   NA 139 09/04/2024   K 4.8 09/04/2024   CL 102 09/04/2024   CREATININE 1.09  09/04/2024   BUN 35 (H) 09/04/2024   CO2 32 09/04/2024   TSH 1.230 06/10/2024   INR 0.9 08/02/2023   HGBA1C 6.2 09/04/2024   Assessment/Plan:  Susan Davidson is a 74 y.o. White or Caucasian [1] female with  has a past medical history of ANXIETY (02/12/2008), CVA (cerebral vascular accident) (HCC), DEPRESSION (02/12/2008), HYPERLIPIDEMIA (02/12/2008), HYPERTENSION (02/12/2008), HYPOTHYROIDISM (02/12/2008), Impaired glucose tolerance (08/27/2011), Left hemiparesis (HCC), OSTEOPENIA (02/12/2008), and VITAMIN D  DEFICIENCY (04/14/2010).  Essential hypertension BP Readings from Last 3 Encounters:  10/31/24 124/80  09/04/24 126/78  06/20/24 116/68   Stable, pt to continue medical treatment lopressor  12. 5 qd   Impaired glucose tolerance Lab Results  Component Value Date   HGBA1C 6.2 09/04/2024   Stable, pt to continue current medical treatment  - diet,, wt control   Spinal stenosis at L4-L5 level Today with high suspicion of flare leading to neuritic pain to the legs left > right, only at bedtime sleeping hours - ok for trial gabapentin  300 mg at bedtime prn  Insomnia Also for gabapentin  300 mg at bedtime,  to f/u any worsening symptoms or concerns  Followup: Return in about 6 months (around 04/30/2025).  Lynwood Rush, MD 10/31/2024 8:18 PM [facility] [place] [facility] Medicine     [1]  Allergies Allergen Reactions   Fire Ant (Solenopsis Invicta) Anaphylaxis   Aleve [Naproxen] Nausea Only   Covid-19 Mrna Vaccine [employer]) [Covid-19 Mrna Vacc (Moderna)] Other (See Comments)    Just the first round of moderna Covid vaccine, sluggish, uncoordinated, and weak, similar to flu.   Influenza Vac Split Quad Other (See Comments)    Fatigue, mimics a super bad flu  [2]  Current Outpatient Medications on File Prior to Visit  Medication Sig Dispense Refill   acetaminophen  (TYLENOL ) 325 MG tablet Take 2 tablets (650 mg  total) by mouth every 6  (six) hours as needed for mild pain or headache (fever >/= 101). 12 tablet 2   albuterol  (VENTOLIN  HFA) 108 (90 Base) MCG/ACT inhaler Inhale 2 puffs into the lungs every 6 (six) hours as needed for wheezing or shortness of breath. 17 each 2   alendronate  (FOSAMAX ) 70 MG tablet TAKE 1 TABLET EVERY 7 DAYS WITH A FULL GLASS OF WATER ON AN EMPTY STOMACH 12 tablet 3   ALPRAZolam  (XANAX ) 0.5 MG tablet TAKE 1 TABLET TWICE DAILY AS NEEDED FOR ANXIETY 60 tablet 2   ascorbic acid  (VITAMIN C) 500 MG tablet Take 1 tablet (500 mg total) by mouth daily. 30 tablet 2   Cholecalciferol (VITAMIN D -3 PO) Take 1 capsule by mouth at bedtime.     clopidogrel  (PLAVIX ) 75 MG tablet TAKE 1 TABLET EVERY DAY 90 tablet 3   Coenzyme Q10 (COQ10 PO) Take 1 tablet by mouth at bedtime.     divalproex  (DEPAKOTE ) 250 MG DR tablet Take 1 tablet by mouth See admin instructions. 1 tablet in the morning and 2 tablets every evening     folic acid  (FOLVITE ) 800 MCG tablet Take 400 mcg by mouth at bedtime.     isosorbide  mononitrate (IMDUR ) 30 MG 24 hr tablet TAKE 1 TABLET EVERY DAY 90 tablet 2   lactose free nutrition (BOOST) LIQD Take 237 mLs by mouth 2 (two) times daily between meals. At least one a day and sometimes more     levothyroxine  (SYNTHROID ) 100 MCG tablet Take 1 tablet (100 mcg total) by mouth daily. 90 tablet 3   LINZESS  145 MCG CAPS capsule TAKE 1 CAPSULE EVERY DAY 90 capsule 3   MAGNESIUM OXIDE, ELEMENTAL, PO Take 1 tablet by mouth at bedtime.     memantine  (NAMENDA ) 5 MG tablet Take 1 tablet (5 mg total) by mouth 2 (two) times daily. 180 tablet 3   metoprolol  tartrate (LOPRESSOR ) 25 MG tablet TAKE 1/2 TABLET TWICE DAILY 90 tablet 3   Multiple Vitamin (MULTIVITAMIN) tablet Take 1 tablet by mouth at bedtime.     nitroGLYCERIN  (NITROSTAT ) 0.4 MG SL tablet Place 1 tablet (0.4 mg total) under the tongue every 5 (five) minutes as needed for chest pain. Up to 3 doses in one 24 hour period 45 tablet 3   pantoprazole  (PROTONIX )  40 MG tablet Take 1 tablet (40 mg total) by mouth daily. 90 tablet 3   PARoxetine  (PAXIL ) 40 MG tablet Take 40 mg by mouth every morning.     QUEtiapine  (SEROQUEL ) 50 MG tablet Take 50 mg by mouth at bedtime.     rosuvastatin  (CRESTOR ) 20 MG tablet TAKE 1 TABLET AT BEDTIME 90 tablet 3   No current facility-administered medications on file prior to visit.

## 2024-10-31 NOTE — Patient Instructions (Signed)
 Please take all new medication as prescribed- the gabapentin  300 mg at bedtime for sleep and pain  Please continue all other medications as before, and refills have been done if requested.  Please have the pharmacy call with any other refills you may need.  Please keep your appointments with your specialists as you may have planned  - Neurology next month  No further lab testing today is needed  Please make an Appointment to return in 6 months, or sooner if needed

## 2024-10-31 NOTE — Assessment & Plan Note (Signed)
 Lab Results  Component Value Date   HGBA1C 6.2 09/04/2024   Stable, pt to continue current medical treatment  - diet,, wt control

## 2024-10-31 NOTE — Assessment & Plan Note (Signed)
 Also for gabapentin  300 mg at bedtime,  to f/u any worsening symptoms or concerns

## 2024-10-31 NOTE — Assessment & Plan Note (Signed)
 BP Readings from Last 3 Encounters:  10/31/24 124/80  09/04/24 126/78  06/20/24 116/68   Stable, pt to continue medical treatment lopressor  12. 5 qd

## 2024-12-18 ENCOUNTER — Ambulatory Visit: Admitting: Nurse Practitioner

## 2025-01-02 ENCOUNTER — Ambulatory Visit: Admitting: Family Medicine

## 2025-04-22 ENCOUNTER — Ambulatory Visit: Admitting: Podiatry

## 2025-05-15 ENCOUNTER — Ambulatory Visit
